# Patient Record
Sex: Male | Born: 1950
Health system: Southern US, Community
[De-identification: ages and names within clinical notes are randomized; demographics above are authoritative.]

## PROBLEM LIST (undated history)

## (undated) DIAGNOSIS — F17200 Nicotine dependence, unspecified, uncomplicated: Secondary | ICD-10-CM

## (undated) DIAGNOSIS — I639 Cerebral infarction, unspecified: Secondary | ICD-10-CM

## (undated) DIAGNOSIS — R531 Weakness: Secondary | ICD-10-CM

## (undated) DIAGNOSIS — I10 Essential (primary) hypertension: Secondary | ICD-10-CM

## (undated) DIAGNOSIS — N400 Enlarged prostate without lower urinary tract symptoms: Secondary | ICD-10-CM

## (undated) DIAGNOSIS — Z9289 Personal history of other medical treatment: Secondary | ICD-10-CM

## (undated) DIAGNOSIS — F1910 Other psychoactive substance abuse, uncomplicated: Secondary | ICD-10-CM

## (undated) HISTORY — DX: Weakness: R53.1

## (undated) HISTORY — PX: ABDOMINAL SURGERY: SHX537

## (undated) HISTORY — DX: Essential (primary) hypertension: I10

## (undated) HISTORY — PX: MANDIBLE SURGERY: SHX707

## (undated) HISTORY — PX: FRACTURE SURGERY: SHX138

## (undated) HISTORY — DX: Nicotine dependence, unspecified, uncomplicated: F17.200

---

## 1999-02-21 ENCOUNTER — Encounter: Payer: Self-pay | Admitting: *Deleted

## 1999-02-21 ENCOUNTER — Emergency Department (HOSPITAL_COMMUNITY): Admission: EM | Admit: 1999-02-21 | Discharge: 1999-02-21 | Payer: Self-pay | Admitting: *Deleted

## 2002-09-26 ENCOUNTER — Encounter: Payer: Self-pay | Admitting: *Deleted

## 2002-09-26 ENCOUNTER — Emergency Department (HOSPITAL_COMMUNITY): Admission: EM | Admit: 2002-09-26 | Discharge: 2002-09-26 | Payer: Self-pay | Admitting: Emergency Medicine

## 2002-12-28 ENCOUNTER — Emergency Department (HOSPITAL_COMMUNITY): Admission: EM | Admit: 2002-12-28 | Discharge: 2002-12-28 | Payer: Self-pay | Admitting: Emergency Medicine

## 2003-01-04 ENCOUNTER — Ambulatory Visit (HOSPITAL_BASED_OUTPATIENT_CLINIC_OR_DEPARTMENT_OTHER): Admission: RE | Admit: 2003-01-04 | Discharge: 2003-01-04 | Payer: Self-pay | Admitting: General Surgery

## 2005-06-24 ENCOUNTER — Emergency Department (HOSPITAL_COMMUNITY): Admission: EM | Admit: 2005-06-24 | Discharge: 2005-06-24 | Payer: Self-pay | Admitting: Family Medicine

## 2005-06-25 ENCOUNTER — Ambulatory Visit: Payer: Self-pay | Admitting: Family Medicine

## 2005-06-25 ENCOUNTER — Ambulatory Visit: Payer: Self-pay | Admitting: *Deleted

## 2007-04-15 ENCOUNTER — Emergency Department (HOSPITAL_COMMUNITY): Admission: EM | Admit: 2007-04-15 | Discharge: 2007-04-15 | Payer: Self-pay | Admitting: Emergency Medicine

## 2008-06-27 ENCOUNTER — Inpatient Hospital Stay (HOSPITAL_COMMUNITY): Admission: EM | Admit: 2008-06-27 | Discharge: 2008-07-02 | Payer: Self-pay | Admitting: Emergency Medicine

## 2008-06-27 ENCOUNTER — Ambulatory Visit: Payer: Self-pay | Admitting: Cardiology

## 2008-06-28 ENCOUNTER — Ambulatory Visit: Payer: Self-pay | Admitting: *Deleted

## 2008-06-28 ENCOUNTER — Encounter (INDEPENDENT_AMBULATORY_CARE_PROVIDER_SITE_OTHER): Payer: Self-pay | Admitting: *Deleted

## 2008-06-28 ENCOUNTER — Ambulatory Visit: Payer: Self-pay | Admitting: Physical Medicine & Rehabilitation

## 2008-08-09 ENCOUNTER — Ambulatory Visit: Payer: Self-pay | Admitting: Internal Medicine

## 2008-08-09 LAB — CONVERTED CEMR LAB
ALT: 15 units/L (ref 0–53)
AST: 17 units/L (ref 0–37)
Albumin: 4.4 g/dL (ref 3.5–5.2)
CO2: 23 meq/L (ref 19–32)
Calcium: 9.7 mg/dL (ref 8.4–10.5)
Chloride: 107 meq/L (ref 96–112)
Cholesterol: 218 mg/dL — ABNORMAL HIGH (ref 0–200)
Creatinine, Ser: 1.13 mg/dL (ref 0.40–1.50)
Microalb, Ur: 0.75 mg/dL (ref 0.00–1.89)
Potassium: 4.5 meq/L (ref 3.5–5.3)
Total Protein: 7.4 g/dL (ref 6.0–8.3)

## 2010-03-04 ENCOUNTER — Emergency Department (HOSPITAL_COMMUNITY): Admission: EM | Admit: 2010-03-04 | Discharge: 2010-03-04 | Payer: Self-pay | Admitting: Emergency Medicine

## 2010-04-11 ENCOUNTER — Ambulatory Visit (HOSPITAL_COMMUNITY)
Admission: RE | Admit: 2010-04-11 | Discharge: 2010-04-11 | Payer: Self-pay | Source: Home / Self Care | Attending: Family Medicine | Admitting: Family Medicine

## 2010-07-16 LAB — COMPREHENSIVE METABOLIC PANEL
ALT: 20 U/L (ref 0–53)
AST: 20 U/L (ref 0–37)
Albumin: 3.9 g/dL (ref 3.5–5.2)
Alkaline Phosphatase: 41 U/L (ref 39–117)
BUN: 14 mg/dL (ref 6–23)
CO2: 25 mEq/L (ref 19–32)
Calcium: 9.4 mg/dL (ref 8.4–10.5)
Chloride: 107 mEq/L (ref 96–112)
Creatinine, Ser: 1.22 mg/dL (ref 0.4–1.5)
GFR calc Af Amer: >60 mL/min (ref >60)
GFR calc non Af Amer: >60 mL/min (ref >60)
Glucose, Bld: 98 mg/dL (ref 70–99)
Potassium: 3.9 mEq/L (ref 3.5–5.1)
Sodium: 138 mEq/L (ref 135–145)
Total Bilirubin: 0.9 mg/dL (ref 0.3–1.2)
Total Protein: 7.3 g/dL (ref 6.0–8.3)

## 2010-07-16 LAB — CBC
Hemoglobin: 14.7 g/dL (ref 13.0–17.0)
MCH: 29.9 pg (ref 26.0–34.0)
Platelets: 172 10*3/uL (ref 150–400)
RBC: 4.91 MIL/uL (ref 4.22–5.81)
WBC: 3.9 10*3/uL — ABNORMAL LOW (ref 4.0–10.5)

## 2010-07-16 LAB — PSA: PSA: 1.09 ng/mL (ref <=4.00)

## 2010-07-16 LAB — LIPID PANEL
Triglycerides: 82 mg/dL (ref <150)
VLDL: 16 mg/dL (ref 0–40)

## 2010-07-16 LAB — TSH: TSH: 0.988 u[IU]/mL (ref 0.350–4.500)

## 2010-07-16 LAB — MICROALBUMIN, URINE: Microalb, Ur: 0.77 mg/dL (ref 0.00–1.89)

## 2010-07-17 LAB — POCT I-STAT, CHEM 8
BUN: 19 mg/dL (ref 6–23)
Calcium, Ion: 1.21 mmol/L (ref 1.12–1.32)
Creatinine, Ser: 1.3 mg/dL (ref 0.4–1.5)
Hemoglobin: 16.7 g/dL (ref 13.0–17.0)
TCO2: 22 mmol/L (ref 0–100)

## 2010-07-17 LAB — URINALYSIS, ROUTINE W REFLEX MICROSCOPIC
Glucose, UA: NEGATIVE mg/dL
Hgb urine dipstick: NEGATIVE
Ketones, ur: NEGATIVE mg/dL
Protein, ur: NEGATIVE mg/dL
Urobilinogen, UA: 0.2 mg/dL (ref 0.0–1.0)

## 2010-08-20 LAB — CK TOTAL AND CKMB (NOT AT ARMC)
CK, MB: 0.8 ng/mL (ref 0.3–4.0)
Relative Index: 0.6 (ref 0.0–2.5)
Relative Index: 0.7 (ref 0.0–2.5)

## 2010-08-20 LAB — COMPREHENSIVE METABOLIC PANEL
AST: 19 U/L (ref 0–37)
Albumin: 3.6 g/dL (ref 3.5–5.2)
Calcium: 9.8 mg/dL (ref 8.4–10.5)
Chloride: 103 mEq/L (ref 96–112)
Creatinine, Ser: 1.07 mg/dL (ref 0.4–1.5)
GFR calc Af Amer: >60 mL/min (ref >60)
Total Bilirubin: 1.1 mg/dL (ref 0.3–1.2)

## 2010-08-20 LAB — CBC
HCT: 40.8 % (ref 39.0–52.0)
Hemoglobin: 13.5 g/dL (ref 13.0–17.0)
Hemoglobin: 13.6 g/dL (ref 13.0–17.0)
MCHC: 33.8 g/dL (ref 30.0–36.0)
MCV: 92.2 fL (ref 78.0–100.0)
Platelets: 184 10*3/uL (ref 150–400)
RBC: 4.37 MIL/uL (ref 4.22–5.81)
RDW: 12.6 % (ref 11.5–15.5)
RDW: 12.9 % (ref 11.5–15.5)
WBC: 5.8 10*3/uL (ref 4.0–10.5)

## 2010-08-20 LAB — BASIC METABOLIC PANEL
BUN: 12 mg/dL (ref 6–23)
Calcium: 8.8 mg/dL (ref 8.4–10.5)
Creatinine, Ser: 0.98 mg/dL (ref 0.4–1.5)
GFR calc Af Amer: >60 mL/min (ref >60)
GFR calc non Af Amer: >60 mL/min (ref >60)
GFR calc non Af Amer: >60 mL/min (ref >60)
Sodium: 136 mEq/L (ref 135–145)

## 2010-08-20 LAB — TROPONIN I
Troponin I: <0.01 ng/mL (ref 0.00–0.06)
Troponin I: <0.01 ng/mL (ref 0.00–0.06)

## 2010-08-20 LAB — TSH: TSH: 1.081 u[IU]/mL (ref 0.350–4.500)

## 2010-08-20 LAB — URINALYSIS, ROUTINE W REFLEX MICROSCOPIC
Bilirubin Urine: NEGATIVE
Nitrite: NEGATIVE
Specific Gravity, Urine: 1.023 (ref 1.005–1.030)
Urobilinogen, UA: 1 mg/dL (ref 0.0–1.0)

## 2010-08-20 LAB — POCT I-STAT, CHEM 8
Glucose, Bld: 86 mg/dL (ref 70–99)
HCT: 47 % (ref 39.0–52.0)
Hemoglobin: 16 g/dL (ref 13.0–17.0)
Potassium: 4.6 mEq/L (ref 3.5–5.1)
Sodium: 140 mEq/L (ref 135–145)

## 2010-08-20 LAB — PROTIME-INR: INR: 1 (ref 0.00–1.49)

## 2010-08-20 LAB — DIFFERENTIAL
Basophils Absolute: 0 10*3/uL (ref 0.0–0.1)
Basophils Relative: 1 % (ref 0–1)
Eosinophils Absolute: 0.1 10*3/uL (ref 0.0–0.7)
Neutrophils Relative %: 56 % (ref 43–77)

## 2010-08-20 LAB — LIPID PANEL
Cholesterol: 172 mg/dL (ref 0–200)
HDL: 65 mg/dL (ref >39)
LDL Cholesterol: 86 mg/dL (ref 0–99)
Total CHOL/HDL Ratio: 2.6 RATIO

## 2010-08-20 LAB — RAPID URINE DRUG SCREEN, HOSP PERFORMED
Opiates: NOT DETECTED
Tetrahydrocannabinol: NOT DETECTED

## 2010-08-20 LAB — VITAMIN B12: Vitamin B-12: 413 pg/mL (ref 211–911)

## 2010-08-20 LAB — APTT: aPTT: 31 seconds (ref 24–37)

## 2010-09-17 NOTE — Discharge Summary (Signed)
Andre [name], Andre [name]            ACCOUNT NO.:  [id number]   MEDICAL RECORD NO.:  [id number]          PATIENT TYPE:  INP   LOCATION:  3037                         FACILITY:  [facility]   PHYSICIAN:  Theodosia Paling, MD    DATE OF BIRTH:  31-Mar-1951   DATE OF ADMISSION:  06/27/2008  DATE OF DISCHARGE:                               DISCHARGE SUMMARY   ADDENDUM:   DISCHARGE MEDICATIONS:  1. Aspirin enteric-coated 325 mg p.o. daily.  2. Thiamine 100 p.o. daily.  3. Folic acid 1 mg p.o. daily.  4. Senokot 1 tab p.o. nightly.      Theodosia Paling, MD  Electronically Signed     NP/MEDQ  D:  06/29/2008  T:  06/29/2008  Job:  [id number]

## 2010-09-17 NOTE — H&P (Signed)
Andre [name], Andre [name]            ACCOUNT NO.:  [id number]   MEDICAL RECORD NO.:  [id number]          PATIENT TYPE:  INP   LOCATION:  1828                         FACILITY:  [facility]   PHYSICIAN:  Theodosia Paling, MD    DATE OF BIRTH:  12-14-1950   DATE OF ADMISSION:  06/27/2008  DATE OF DISCHARGE:                              HISTORY & PHYSICAL   This is an admission for [facility] team [facility].   REASON FOR ADMISSION:  Left lower extremity weakness.   PRIMARY CARE Andre [name]:  None.   HISTORY OF PRESENT ILLNESS:  [name] is a 60 year old man with a  history of untreated hypertension who presented with left foot weakness  and numbness, as well as decreased sensation in the left foot that he  noticed upon awakening on June 25, 2008 a.m.  This was the first  time that he ever had an episode of this sort, and the weakness and the  numbness have neither worsened or improved since they first started.  The patient has had difficulty walking since he has to slide his left  foot on the floor.  He denies any history of headache, visual  disturbance, dizziness, chest pain, heart palpitations, dyspnea, and  other focal weakness or numbness.   PAST MEDICAL HISTORY:  1. Untreated hypertension.  2. Polysubstance abuse notable for alcohol and cocaine.   HOME MEDICATIONS:  None.   ALLERGIES:  No known drug allergies.   SOCIAL HISTORY:  The patient has smoked one pack per day for 40 years.  He binges on alcohol on weekends, and mainly drinks beer.  He last drank  on June 24, 2008.  He does use cocaine occasionally.   FAMILY HISTORY:  The patient is uncertain of his family history since he  is not close to his family.  He is not sure whether anyone had coronary  artery disease or history of stroke.   PHYSICAL EXAMINATION:  VITAL SIGNS:  Temperature 98.0, heart rate 73,  blood pressure 174/101, SpO2 99% on room air, and respiratory rate 12.  GENERAL:  Middle-aged man in no acute  distress.  EYES:  PERRLA.  EOMI.  Anicteric sclerae.  NECK:  Supple.  No lymphadenopathy, thyromegaly, or carotid bruits.  HEENT:  Atraumatic.  Oropharynx is clear.  Moist mucous membranes.  CARDIOVASCULAR:  Regular rate and rhythm.  No murmurs, rubs, or gallops  appreciated.  LUNGS:  Clear to auscultation bilaterally with good air movement  throughout.  ABDOMEN:  Bowel sounds positive.  Soft, nontender, and nondistended.  No  palpable masses.  No organomegaly.  EXTREMITIES:  No edema, cyanosis, or clubbing.  NEUROLOGIC:  The patient is awake, alert, and oriented x3.  Cranial  nerves II through XII are intact.  Sensitivity to light touch is  decreased in the left upper extremity as well as the left lower  extremity (especially in the left foot).  His deep tendon reflexes are  1+ in both upper extremities.  The right patellar reflex is 2+, but it  is 1- on the left.  He has no Achilles reflex on the left, but a 1+ on  the right.  There is minimal reaction to plantar stimulation on the  left, but brisk reaction on the right (toes downgoing).  Strength is 5/5  in both upper extremities.  Foot dorsiflexion is 5/5 on the right, 1/5  on the left, and foot extension is 4/5 on the right and 2/5 on the left.  Hip flexion is 3/5 on the left and 5/5 on the right.  Hip extension is  3/5 on the left and 5/5 on the right.  I did not assess gait since the  patient was in an emergency room hallway, and it was felt unsafe at the  time.   LABORATORY DATA:  White blood count 4.9, ANC 2.8, hemoglobin 14.1, MCV  92, RDW 12.9, and platelets 184.  Sodium 140, potassium 4.6, chloride  107, bicarb 28, BUN 12, creatinine 1.2, and glucose 86.  UDS positive  for cocaine.   IMAGING:  Head CT scan without contrast medium was negative for bleed or  acute intracranial process.  However, subacute or chronic lacunar  infarct was noted in the anterior limb of the left internal capsule.   ASSESSMENT AND PLAN:  1.  Left foot weakness/numbness.  The patient's neurological symptoms      do not correlate with the lacunar infarct documented on the head CT      scan.  He probably did have a subacute cerebrovascular vascular      accident, although we would have expected it to show what is on the      head CT scan by now, since he has had the symptoms for about 48      hours.  The patient has risk factors for cerebrovascular disease,      notably his cocaine use and untreated hypertension.  The patient      will be admitted to stepdown bed to control his blood pressure.  We      will obtain an MRI and MRA of the brain and neck with and without      contrast medium.  Check 2D echo to assess for thrombus.  Carotid      Dopplers for carotid stenosis.  We will rule out acute coronary      syndrome with serial cardiac enzymes and EKGs, and start him on      daily aspirin.  We will check a homocysteine level as well as coags      and fasting lipid profile.  We will also check an RPR, B12, and      folate level.  A PT/OT consult will be obtained.  A bedside swallow      evaluation will be made before the patient is fed.  2. Stage II hypertension in the setting of probable subacute      cerebrovascular vascular accident.  We need to decrease the      patient's blood pressure by no more than 25%.  We will avoid using      beta-blockers because his UDS is positive for cocaine.  The patient      will be admitted to the [facility], and started on a nicardipine      drip at 5 mg/hr to be titrated for a goal systolic of 140-155.  We      will also start him on hydrochlorothiazide and lisinopril that will      be ordered with hold parameters.  3. Polysubstance abuse.  The patient does relay a history of becoming      nervous  and jittery when he has not had a drink in a few days.      However, he does not have a frank history of DTs.  We will start      him on a CIWA protocol with orders for the RN to contact MD if  any      drugs are needed.  We will start thiamine and folate      supplementation.  A tobacco cessation consult will be obtained as      well as substance abuse counseling.  4. Prophylaxis.  SCDs will be used for deep venous thrombosis      prophylaxis.  At this point, the patient does not need any      gastrointestinal prophylaxis.      Olene Craven, M.D.  Electronically Signed      Theodosia Paling, MD  Electronically Signed    MC/MEDQ  D:  06/27/2008  T:  06/28/2008  Job:  [phone]

## 2010-09-17 NOTE — Discharge Summary (Signed)
NAMECHANE, Andre [name]            ACCOUNT NO.:  [id number]   MEDICAL RECORD NO.:  [id number]          PATIENT TYPE:  INP   LOCATION:  3037                         FACILITY:  [facility]   PHYSICIAN:  Theodosia Paling, MD    DATE OF BIRTH:  [birth year]   DATE OF ADMISSION:  06/27/2008  DATE OF DISCHARGE:  06/29/2008                               DISCHARGE SUMMARY   ADMITTING HISTORY:  Please refer to the excellent admission note  dictated by Dr. Kathe Becton under history of present illness.   DISCHARGE DIAGNOSES:  1. Subacute right internal capsule stroke.  2. Polysubstance abuse for alcohol and cocaine.   DISCHARGE MEDICATION:  1. Aspirin enteric-coated 325 mg p.o. daily.  2. Thiamine 100 mg p.o. daily.  3. Folic acid 1 mg p.o. daily.  4. Senokot 1 tablet p.o. q.h.s.   HOSPITAL COURSE:  The following issues were addressed during the  hospitalization.  1. Left foot weakness and numbness.  The patient had 3/5 strength in      his left lower leg associated with decreased sensation.  The      patient underwent an MRI evaluation which showed an acute to      subacute ischemic stroke in the posterior limb of the right      internal capsule.  The patient underwent physical therapy      evaluation, who recommended CIR placement.  The patient is going to      CIR for further evaluation and management.  The patient underwent a      carotid duplex which showed no significant internal carotid artery      stenosis.  An echocardiogram of the heart was performed, which      showed overall left ventricular systolic function to be mildly      decreased with a left ventricular ejection fraction ranging from 40-      45% with mild-to-moderate dilation of the left atrium.  The patient      did not undergo transesophageal echo this hospitalization.      Telemetry showed a normal sinus rhythm.  RPR was negative.  Vitamin      B12 was normal.  2. Polysubstance abuse.  The patient did not have any  evidence of      alcohol withdrawal.  P.r.n. Ativan was requested, which he never      needed.  3. Reactive hypertension.  The patient had reactive hypertension in      the setting of acute stroke.  The patient did not need any      antihypertensives during hospitalization.  The  was also almost      normal.   DISPOSITION:  The patient is getting transferred to CIR for further  evaluation and management.   Total time spent in discharge of this patient:  45 minutes.      Theodosia Paling, MD  Electronically Signed    NP/MEDQ  D:  06/29/2008  T:  06/29/2008  Job:  [id number]

## 2010-10-28 ENCOUNTER — Emergency Department (HOSPITAL_COMMUNITY)
Admission: EM | Admit: 2010-10-28 | Discharge: 2010-10-28 | Disposition: A | Discharge status: Home / Self Care | Payer: Medicaid Other | Attending: Emergency Medicine | Admitting: Emergency Medicine

## 2010-10-28 ENCOUNTER — Emergency Department (HOSPITAL_COMMUNITY): Payer: Medicaid Other

## 2010-10-28 DIAGNOSIS — R51 Headache: Secondary | ICD-10-CM | POA: Insufficient documentation

## 2010-10-28 DIAGNOSIS — Z8673 Personal history of transient ischemic attack (TIA), and cerebral infarction without residual deficits: Secondary | ICD-10-CM | POA: Insufficient documentation

## 2010-10-28 DIAGNOSIS — I10 Essential (primary) hypertension: Secondary | ICD-10-CM | POA: Insufficient documentation

## 2010-10-28 DIAGNOSIS — M79609 Pain in unspecified limb: Secondary | ICD-10-CM | POA: Insufficient documentation

## 2010-10-28 DIAGNOSIS — Z79899 Other long term (current) drug therapy: Secondary | ICD-10-CM | POA: Insufficient documentation

## 2010-10-28 DIAGNOSIS — R110 Nausea: Secondary | ICD-10-CM | POA: Insufficient documentation

## 2010-10-28 DIAGNOSIS — R29898 Other symptoms and signs involving the musculoskeletal system: Secondary | ICD-10-CM | POA: Insufficient documentation

## 2010-10-28 DIAGNOSIS — R209 Unspecified disturbances of skin sensation: Secondary | ICD-10-CM | POA: Insufficient documentation

## 2010-11-08 ENCOUNTER — Emergency Department (HOSPITAL_COMMUNITY)
Admission: EM | Admit: 2010-11-08 | Discharge: 2010-11-09 | Disposition: A | Discharge status: Home / Self Care | Payer: Medicaid Other | Attending: Emergency Medicine | Admitting: Emergency Medicine

## 2010-11-08 DIAGNOSIS — S2249XA Multiple fractures of ribs, unspecified side, initial encounter for closed fracture: Secondary | ICD-10-CM | POA: Insufficient documentation

## 2010-11-08 DIAGNOSIS — W108XXA Fall (on) (from) other stairs and steps, initial encounter: Secondary | ICD-10-CM | POA: Insufficient documentation

## 2010-11-09 ENCOUNTER — Emergency Department (HOSPITAL_COMMUNITY): Payer: Medicaid Other

## 2010-11-15 ENCOUNTER — Emergency Department (HOSPITAL_COMMUNITY): Payer: Medicaid Other

## 2010-11-15 ENCOUNTER — Emergency Department (HOSPITAL_COMMUNITY)
Admission: EM | Admit: 2010-11-15 | Discharge: 2010-11-15 | Disposition: A | Discharge status: Home / Self Care | Payer: Medicaid Other | Attending: Emergency Medicine | Admitting: Emergency Medicine

## 2010-11-15 DIAGNOSIS — K5900 Constipation, unspecified: Secondary | ICD-10-CM | POA: Insufficient documentation

## 2010-11-15 DIAGNOSIS — S2249XA Multiple fractures of ribs, unspecified side, initial encounter for closed fracture: Secondary | ICD-10-CM | POA: Insufficient documentation

## 2010-11-15 DIAGNOSIS — I10 Essential (primary) hypertension: Secondary | ICD-10-CM | POA: Insufficient documentation

## 2010-11-15 DIAGNOSIS — R079 Chest pain, unspecified: Secondary | ICD-10-CM | POA: Insufficient documentation

## 2010-11-15 DIAGNOSIS — X58XXXA Exposure to other specified factors, initial encounter: Secondary | ICD-10-CM | POA: Insufficient documentation

## 2010-11-15 DIAGNOSIS — Z79899 Other long term (current) drug therapy: Secondary | ICD-10-CM | POA: Insufficient documentation

## 2010-11-15 DIAGNOSIS — Z09 Encounter for follow-up examination after completed treatment for conditions other than malignant neoplasm: Secondary | ICD-10-CM | POA: Insufficient documentation

## 2011-03-27 ENCOUNTER — Emergency Department (HOSPITAL_COMMUNITY): Payer: Medicaid Other

## 2011-03-27 ENCOUNTER — Encounter: Payer: Self-pay | Admitting: *Deleted

## 2011-03-27 ENCOUNTER — Inpatient Hospital Stay (HOSPITAL_COMMUNITY)
Admission: EM | Admit: 2011-03-27 | Discharge: 2011-04-03 | DRG: 064 | Disposition: A | Discharge status: Home / Self Care | Payer: Medicaid Other | Source: Ambulatory Visit | Attending: Internal Medicine | Admitting: Internal Medicine

## 2011-03-27 DIAGNOSIS — J690 Pneumonitis due to inhalation of food and vomit: Secondary | ICD-10-CM | POA: Diagnosis present

## 2011-03-27 DIAGNOSIS — Z23 Encounter for immunization: Secondary | ICD-10-CM

## 2011-03-27 DIAGNOSIS — N179 Acute kidney failure, unspecified: Secondary | ICD-10-CM | POA: Diagnosis present

## 2011-03-27 DIAGNOSIS — I10 Essential (primary) hypertension: Secondary | ICD-10-CM

## 2011-03-27 DIAGNOSIS — F1210 Cannabis abuse, uncomplicated: Secondary | ICD-10-CM | POA: Diagnosis present

## 2011-03-27 DIAGNOSIS — F1020 Alcohol dependence, uncomplicated: Secondary | ICD-10-CM | POA: Diagnosis present

## 2011-03-27 DIAGNOSIS — F17200 Nicotine dependence, unspecified, uncomplicated: Secondary | ICD-10-CM | POA: Diagnosis present

## 2011-03-27 DIAGNOSIS — F10239 Alcohol dependence with withdrawal, unspecified: Secondary | ICD-10-CM | POA: Diagnosis present

## 2011-03-27 DIAGNOSIS — J9600 Acute respiratory failure, unspecified whether with hypoxia or hypercapnia: Secondary | ICD-10-CM | POA: Diagnosis present

## 2011-03-27 DIAGNOSIS — R4182 Altered mental status, unspecified: Secondary | ICD-10-CM

## 2011-03-27 DIAGNOSIS — I6350 Cerebral infarction due to unspecified occlusion or stenosis of unspecified cerebral artery: Principal | ICD-10-CM | POA: Diagnosis present

## 2011-03-27 DIAGNOSIS — I63232 Cerebral infarction due to unspecified occlusion or stenosis of left carotid arteries: Secondary | ICD-10-CM

## 2011-03-27 DIAGNOSIS — I6340 Cerebral infarction due to embolism of unspecified cerebral artery: Secondary | ICD-10-CM

## 2011-03-27 DIAGNOSIS — F1910 Other psychoactive substance abuse, uncomplicated: Secondary | ICD-10-CM | POA: Diagnosis present

## 2011-03-27 DIAGNOSIS — F10939 Alcohol use, unspecified with withdrawal, unspecified: Secondary | ICD-10-CM | POA: Diagnosis present

## 2011-03-27 DIAGNOSIS — K5900 Constipation, unspecified: Secondary | ICD-10-CM | POA: Diagnosis present

## 2011-03-27 DIAGNOSIS — R0902 Hypoxemia: Secondary | ICD-10-CM | POA: Diagnosis present

## 2011-03-27 HISTORY — DX: Cerebral infarction, unspecified: I63.9

## 2011-03-27 HISTORY — DX: Essential (primary) hypertension: I10

## 2011-03-27 LAB — RAPID URINE DRUG SCREEN, HOSP PERFORMED
Barbiturates: NOT DETECTED
Benzodiazepines: NOT DETECTED

## 2011-03-27 LAB — POCT I-STAT 3, VENOUS BLOOD GAS (G3P V)
Acid-base deficit: 4 mmol/L — ABNORMAL HIGH (ref 0.0–2.0)
pCO2, Ven: 47.6 mmHg (ref 45.0–50.0)
pO2, Ven: 33 mmHg (ref 30.0–45.0)

## 2011-03-27 LAB — CBC
MCHC: 32.8 g/dL (ref 30.0–36.0)
MCV: 90.7 fL (ref 78.0–100.0)
Platelets: 174 10*3/uL (ref 150–400)
RDW: 13.8 % (ref 11.5–15.5)
WBC: 12.4 10*3/uL — ABNORMAL HIGH (ref 4.0–10.5)

## 2011-03-27 LAB — COMPREHENSIVE METABOLIC PANEL
ALT: 43 U/L (ref 0–53)
Albumin: 3.9 g/dL (ref 3.5–5.2)
Alkaline Phosphatase: 56 U/L (ref 39–117)
BUN: 31 mg/dL — ABNORMAL HIGH (ref 6–23)
Chloride: 98 mEq/L (ref 96–112)
GFR calc Af Amer: 25 mL/min — ABNORMAL LOW (ref >90)
Glucose, Bld: 125 mg/dL — ABNORMAL HIGH (ref 70–99)
Potassium: 4.3 mEq/L (ref 3.5–5.1)
Total Bilirubin: 0.3 mg/dL (ref 0.3–1.2)

## 2011-03-27 LAB — URINE MICROSCOPIC-ADD ON

## 2011-03-27 LAB — ETHANOL: Alcohol, Ethyl (B): 149 mg/dL — ABNORMAL HIGH (ref 0–11)

## 2011-03-27 LAB — URINALYSIS, ROUTINE W REFLEX MICROSCOPIC
Ketones, ur: 15 mg/dL — AB
Leukocytes, UA: NEGATIVE
Nitrite: NEGATIVE
Specific Gravity, Urine: 1.017 (ref 1.005–1.030)
pH: 5 (ref 5.0–8.0)

## 2011-03-27 LAB — DIFFERENTIAL
Basophils Absolute: 0 10*3/uL (ref 0.0–0.1)
Basophils Relative: 0 % (ref 0–1)
Eosinophils Absolute: 0 10*3/uL (ref 0.0–0.7)
Eosinophils Relative: 0 % (ref 0–5)
Lymphocytes Relative: 6 % — ABNORMAL LOW (ref 12–46)

## 2011-03-27 LAB — MRSA PCR SCREENING: MRSA by PCR: NEGATIVE

## 2011-03-27 MED ORDER — THIAMINE HCL 100 MG/ML IJ SOLN
100.0000 mg | Freq: Every day | INTRAMUSCULAR | Status: DC
  Administered 2011-03-27 – 2011-03-28 (×2): 200 mg via INTRAVENOUS
  Administered 2011-03-29: 100 mg via INTRAVENOUS
  Filled 2011-03-27 (×5): qty 1

## 2011-03-27 MED ORDER — PANTOPRAZOLE SODIUM 40 MG IV SOLR
40.0000 mg | INTRAVENOUS | Status: DC
  Administered 2011-03-27: 40 mg via INTRAVENOUS

## 2011-03-27 MED ORDER — FOLIC ACID 5 MG/ML IJ SOLN
1.0000 mg | Freq: Every day | INTRAMUSCULAR | Status: DC
  Administered 2011-03-27 – 2011-03-30 (×4): 1 mg via INTRAVENOUS
  Filled 2011-03-27 (×6): qty 0.2

## 2011-03-27 MED ORDER — SODIUM CHLORIDE 0.9 % IV SOLN
3.0000 g | INTRAVENOUS | Status: AC
  Administered 2011-03-27: 3 g via INTRAVENOUS
  Filled 2011-03-27: qty 3

## 2011-03-27 MED ORDER — VITAMIN B-1 100 MG PO TABS
100.0000 mg | ORAL_TABLET | Freq: Every day | ORAL | Status: DC
  Administered 2011-03-30 – 2011-04-03 (×5): 100 mg via ORAL
  Filled 2011-03-27 (×8): qty 1

## 2011-03-27 MED ORDER — HEPARIN SODIUM (PORCINE) 5000 UNIT/ML IJ SOLN
5000.0000 [IU] | Freq: Three times a day (TID) | INTRAMUSCULAR | Status: DC
  Administered 2011-03-27 – 2011-04-03 (×21): 5000 [IU] via SUBCUTANEOUS
  Filled 2011-03-27 (×24): qty 1

## 2011-03-27 MED ORDER — PANTOPRAZOLE SODIUM 40 MG IV SOLR
40.0000 mg | INTRAVENOUS | Status: DC
  Administered 2011-03-28 – 2011-03-30 (×3): 40 mg via INTRAVENOUS
  Filled 2011-03-27 (×3): qty 40

## 2011-03-27 MED ORDER — LORAZEPAM 2 MG/ML IJ SOLN: 0.0000 mg | Freq: Two times a day (BID) | INTRAMUSCULAR | Status: DC

## 2011-03-27 MED ORDER — THERA M PLUS PO TABS
1.0000 | ORAL_TABLET | Freq: Every day | ORAL | Status: DC
  Administered 2011-03-30 – 2011-04-03 (×5): 1 via ORAL
  Filled 2011-03-27 (×8): qty 1

## 2011-03-27 MED ORDER — SODIUM CHLORIDE 0.9 % IV SOLN
Freq: Once | INTRAVENOUS | Status: AC
  Administered 2011-03-27: 19:00:00 via INTRAVENOUS

## 2011-03-27 MED ORDER — ONDANSETRON HCL 4 MG/2ML IJ SOLN
4.0000 mg | Freq: Four times a day (QID) | INTRAMUSCULAR | Status: DC | PRN
  Administered 2011-03-27: 4 mg via INTRAVENOUS
  Filled 2011-03-27: qty 2

## 2011-03-27 MED ORDER — SODIUM CHLORIDE 0.9 % IV SOLN
3.0000 g | Freq: Two times a day (BID) | INTRAVENOUS | Status: DC
  Administered 2011-03-28 – 2011-03-29 (×3): 3 g via INTRAVENOUS
  Filled 2011-03-27 (×4): qty 3

## 2011-03-27 MED ORDER — FOLIC ACID 1 MG PO TABS
1.0000 mg | ORAL_TABLET | Freq: Every day | ORAL | Status: DC
  Administered 2011-03-30 – 2011-04-03 (×5): 1 mg via ORAL
  Filled 2011-03-27 (×8): qty 1

## 2011-03-27 MED ORDER — LORAZEPAM 1 MG PO TABS: 1.0000 mg | ORAL_TABLET | Freq: Four times a day (QID) | ORAL | Status: DC | PRN

## 2011-03-27 MED ORDER — LORAZEPAM 2 MG/ML IJ SOLN
0.0000 mg | Freq: Four times a day (QID) | INTRAMUSCULAR | Status: DC
  Administered 2011-03-27: 2 mg via INTRAVENOUS
  Administered 2011-03-28: 1 mg via INTRAVENOUS
  Administered 2011-03-28: 2 mg via INTRAVENOUS
  Filled 2011-03-27 (×2): qty 1

## 2011-03-27 MED ORDER — LORAZEPAM 2 MG/ML IJ SOLN
1.0000 mg | Freq: Four times a day (QID) | INTRAMUSCULAR | Status: DC | PRN
  Administered 2011-03-28: 1 mg via INTRAVENOUS
  Filled 2011-03-27 (×2): qty 1

## 2011-03-27 MED ORDER — SODIUM CHLORIDE 0.9 % IV SOLN
INTRAVENOUS | Status: DC
  Administered 2011-03-27: 10:00:00 via INTRAVENOUS

## 2011-03-27 MED ORDER — ENOXAPARIN SODIUM 40 MG/0.4ML ~~LOC~~ SOLN: 40.0000 mg | SUBCUTANEOUS | Status: DC

## 2011-03-27 MED ORDER — PANTOPRAZOLE SODIUM 40 MG IV SOLR
INTRAVENOUS | Status: AC
  Filled 2011-03-27: qty 40

## 2011-03-27 NOTE — Consult Note (Signed)
Reason for Consult:"found poorly responsive in am this morning"  HPI: Andre [name] is an 60 y.o. Male. Who was found to be poorly responsive in am by a friend. It is believed that the patient had been drinking the night before. Patient himself is not awake long enough to provide detailed history. Patient has a history of cocaine use and his utox came back positive for cannabis today. EtoH level 149 in ED.  Past Medical History  Diagnosis Date  . Hypertension   . Stroke    Past Surgical History  Procedure Date  . Abdominal surgery    FH: History reviewed. No pertinent family history.  Social History:  reports that he has been smoking.  He does not have any smokeless tobacco history on file. He reports that he drinks alcohol. His drug history not on file.  Allergies: No Known Allergies  Medications: I have reviewed the patient's current medications.  ROS: as above  Blood pressure 161/90, pulse 104, temperature 99.7 F (37.6 C), temperature source Rectal, resp. rate 28, SpO2 88.00%.  Neurological exam: very lethargic, arousable only briefly to painful stimuli to tell me his name and words like "yes." Did not follow simple or complex commands. No obvious aphasia, but limited exam due to lethargy, PERRL, blink to threat positive bilaterally, no facial asymmetry, withdrew all 4 ext. To pain. Reflexes symmetric, except R BR is 1+ and L BR is 2+.  Results for orders placed during the hospital encounter of 03/27/11 (from the past 48 hour(s))  COMPREHENSIVE METABOLIC PANEL     Status: Abnormal   Collection Time   03/27/11 11:00 AM      Component Value Range Comment   Sodium 140  135 - 145 (mEq/L)    Potassium 4.3  3.5 - 5.1 (mEq/L) SLIGHT HEMOLYSIS   Chloride 98  96 - 112 (mEq/L)    CO2 18 (*) 19 - 32 (mEq/L)    Glucose, Bld 125 (*) 70 - 99 (mg/dL)    BUN 31 (*) 6 - 23 (mg/dL)    Creatinine, Ser 1.61 (*) 0.50 - 1.35 (mg/dL)    Calcium 9.3  8.4 - 10.5 (mg/dL)    Total Protein 8.3   6.0 - 8.3 (g/dL)    Albumin 3.9  3.5 - 5.2 (g/dL)    AST 68 (*) 0 - 37 (U/L) HEMOLYSIS AT THIS LEVEL MAY AFFECT RESULT   ALT 43  0 - 53 (U/L)    Alkaline Phosphatase 56  39 - 117 (U/L)    Total Bilirubin 0.3  0.3 - 1.2 (mg/dL)    GFR calc non Af Amer 22 (*) >90 (mL/min)    GFR calc Af Amer 25 (*) >90 (mL/min)   CBC     Status: Abnormal   Collection Time   03/27/11 11:00 AM      Component Value Range Comment   WBC 12.4 (*) 4.0 - 10.5 (K/uL)    RBC 5.14  4.22 - 5.81 (MIL/uL)    Hemoglobin 15.3  13.0 - 17.0 (g/dL)    HCT 09.6  04.5 - 40.9 (%)    MCV 90.7  78.0 - 100.0 (fL)    MCH 29.8  26.0 - 34.0 (pg)    MCHC 32.8  30.0 - 36.0 (g/dL)    RDW 81.1  91.4 - 78.2 (%)    Platelets 174  150 - 400 (K/uL)   DIFFERENTIAL     Status: Abnormal   Collection Time   03/27/11 11:00 AM  Component Value Range Comment   Neutrophils Relative 90 (*) 43 - 77 (%)    Neutro Abs 11.1 (*) 1.7 - 7.7 (K/uL)    Lymphocytes Relative 6 (*) 12 - 46 (%)    Lymphs Abs 0.8  0.7 - 4.0 (K/uL)    Monocytes Relative 4  3 - 12 (%)    Monocytes Absolute 0.5  0.1 - 1.0 (K/uL)    Eosinophils Relative 0  0 - 5 (%)    Eosinophils Absolute 0.0  0.0 - 0.7 (K/uL)    Basophils Relative 0  0 - 1 (%)    Basophils Absolute 0.0  0.0 - 0.1 (K/uL)   ETHANOL     Status: Abnormal   Collection Time   03/27/11 11:00 AM      Component Value Range Comment   Alcohol, Ethyl (B) 149 (*) 0 - 11 (mg/dL)   POCT I-STAT 3, BLOOD GAS (G3P V)     Status: Abnormal   Collection Time   03/27/11 11:38 AM      Component Value Range Comment   pH, Ven 7.298  7.250 - 7.300     pCO2, Ven 47.6  45.0 - 50.0 (mmHg)    pO2, Ven 33.0  30.0 - 45.0 (mmHg)    Bicarbonate 23.1  20.0 - 24.0 (mEq/L)    TCO2 25  0 - 100 (mmol/L)    O2 Saturation 54.0      Acid-base deficit 4.0 (*) 0.0 - 2.0 (mmol/L)    Patient temperature 99.7 F      Sample type VENOUS      Comment NOTIFIED PHYSICIAN     URINE RAPID DRUG SCREEN (HOSP PERFORMED)     Status: Abnormal    Collection Time   03/27/11 11:49 AM      Component Value Range Comment   Opiates NONE DETECTED  NONE DETECTED     Cocaine NONE DETECTED  NONE DETECTED     Benzodiazepines NONE DETECTED  NONE DETECTED     Amphetamines NONE DETECTED  NONE DETECTED     Tetrahydrocannabinol POSITIVE (*) NONE DETECTED     Barbiturates NONE DETECTED  NONE DETECTED    URINALYSIS, ROUTINE W REFLEX MICROSCOPIC     Status: Abnormal   Collection Time   03/27/11 11:49 AM      Component Value Range Comment   Color, Urine AMBER (*) YELLOW  BIOCHEMICALS MAY BE AFFECTED BY COLOR   Appearance CLOUDY (*) CLEAR     Specific Gravity, Urine 1.017  1.005 - 1.030     pH 5.0  5.0 - 8.0     Glucose, UA NEGATIVE  NEGATIVE (mg/dL)    Hgb urine dipstick LARGE (*) NEGATIVE     Bilirubin Urine NEGATIVE  NEGATIVE     Ketones, ur 15 (*) NEGATIVE (mg/dL)    Protein, ur 244 (*) NEGATIVE (mg/dL)    Urobilinogen, UA 0.2  0.0 - 1.0 (mg/dL)    Nitrite NEGATIVE  NEGATIVE     Leukocytes, UA NEGATIVE  NEGATIVE    URINE MICROSCOPIC-ADD ON     Status: Abnormal   Collection Time   03/27/11 11:49 AM      Component Value Range Comment   Squamous Epithelial / LPF RARE  RARE     WBC, UA 3-6  <3 (WBC/hpf)    RBC / HPF 7-10  <3 (RBC/hpf)    Bacteria, UA MANY (*) RARE     Casts HYALINE CASTS (*) NEGATIVE  GRANULAR CAST   Dg  Chest 2 View  03/27/2011  *RADIOLOGY REPORT*  Clinical Data: Altered mental status.  Chest pain and shortness of breath.  CHEST - 2 VIEW  Comparison: 11/15/2010  Findings: Low lung volumes are seen with mild bibasilar atelectasis.  No evidence of pulmonary air space disease or pleural effusion.  Heart size is prominent but likely due to patient positioning and low lung volumes.  IMPRESSION: Low lung volumes with mild bibasilar atelectasis.  Original Report Authenticated By: Danae Orleans, M.D.   Ct Head Wo Contrast  03/27/2011  *RADIOLOGY REPORT*  Clinical Data: Altered mental status.  CT HEAD WITHOUT CONTRAST  Technique:   Contiguous axial images were obtained from the base of the skull through the vertex without contrast.  Comparison: 10/28/2010  Findings: There is new low density involving the left cerebellar hemisphere suggesting edema.  No significant mass effect.  There may be a small lacune in the left basal ganglia and left thalamus. There is a subtle area of low density in the left white matter tract that may be old.  No evidence for acute hemorrhage, midline shift or hydrocephalus.  Paranasal sinuses are clear.  No acute bony abnormality.  IMPRESSION: New low density involving the left cerebellar hemisphere.  Findings are suggestive for edema and acute/subacute infarct.  Evidence for scattered lacunar injuries of unknown age.  These results were called by telephone on 03/27/2011  at  11:04 a.m. to  Dr.Jacubowitz, who verbally acknowledged these results.  Original Report Authenticated By: Richarda Overlie, M.D.   Assessment/Plan: 59 years old man with history of drug use who was drinking last night and was found minimally responsive in am. CT head shows L cerebellar CVA 1) ICU 2) Neurochecks q1h 3) Frequent vital signs 4) Mean Arterial BP between 120 and 130 5) Echo, Telemetry, CD 6) Lipid Profile, A1C, TFT 7) Bed Rest 8) Fall precautions 9) Intermittent compression stockings 10) ASA 300 mg pr 11) Head of Bed at 30 degress 12) NS at 50 cc/hr 13) NPO 14) Speech and swallow eval 15) Neurosurgery consult is appreciated 16) Will follow 17) Repeat CT head for any acute change in MS or neurological exam 18) CT head tomorrow in am if not done previously otherwise  Payton Moder 03/27/2011, 1:44 PM

## 2011-03-27 NOTE — ED Notes (Addendum)
Pt with large episode of vomiting. Nursing staff was at bedside and was able to assist pt to sit up all the way. Pt made little effort with vomiting. Pt suctioned. EDP made aware that pt may have aspirated due to clearing of throat and oxygen sats decreased to 85% on RA. Pt cleaned and is on 4L of O2 via [place]. Pt remains very groggy but will open eyes if asked to. When asked pt states he feels better. Pt remains on monitor and pulse ox.

## 2011-03-27 NOTE — H&P (Signed)
Patient name: Andre [name] Medical record number: [id number] Date of birth: 1951/02/05 Age: 60 y.o. Gender: male PCP: No primary provider on file.  Date: 03/27/2011 Reason for Consult: Cerebellar Stroke and AMS Referring Physician: Dr. Mare Loan  Brief history patient is a 60 year old man brought to the ER after was found unresponsive- found to have acute cerebellar stroke and became hypoxic while in ER.  Lines/tubes  Culture data/sepsis markers 11/22>> BCx x2>> 11/22 >> Urine culture>> Antibiotics 11/22>>Unasyn IV- ( poss Asp PNA )  Best practice GI prophylaxis : Ppi VTE prophylaxis: UFH  Protocols/consults Neuro physician Neurosurgery  Events/studies Altered mental status- in ER-CT head-11/22- acute left cerebellar stroke. Neuro physician and neurosurgery consult placed.  HPI: Patient is a 60 year old man with past with history of hypertension, polysubstance abuse and stroke was brought to the ER after was found unresponsive sitting in his chair- by his brother-in-law who also states that patient was drinking last night. CT head showed acute cerebellar stroke and patient was hypoxic and unresponsive and so CCM was consulted for admitting patient for further management. Patient did vomit once while he was in ER.    Past Medical History  Diagnosis Date  . Hypertension   . Stroke     Past Surgical History  Procedure Date  . Abdominal surgery     History reviewed. No pertinent family history.  Social History:  reports that he has been smoking.  He does not have any smokeless tobacco history on file. He reports that he drinks alcohol. His drug history not on file.  Allergies: No Known Allergies  Medications:  Prior to Admission medications   Not on File    Review of systems not obtained due to altered mental status.  Temp:  [99.7 F (37.6 C)] 99.7 F (37.6 C) (11/22 0950) Pulse Rate:  [91-96] 91  (11/22 1100) Resp:  [18-33] 33  (11/22 1100) BP:  (125-154)/(84-98) 140/84 mmHg (11/22 1100) SpO2:  [90 %-94 %] 93 % (11/22 1100)   No intake or output data in the 24 hours ending 03/27/11 1155 Physical exam Physical Exam  General: somnolent with intermittent eye-opening with commands. HENT:  Eyes: PERRLA - pupils 2mm bilaterally Neck: Neck supple. No tracheal deviation present. No thyromegaly present.  Cardiovascular: S1, S2 , RRR.  No murmur heard.  Pulmonary/Chest: Effort normal and breath sounds normal.  Abdominal: Soft. Bowel sounds are normal. no distension or no tenderness.  Musculoskeletal:  no edema and no tenderness.  Neuro: Spontaneous movement of all extremities. Speech slurred incomprehensible. Does not follow simple commands. Opens eyes intermittently to verbal stimulus. GSC score- 11, normal reflexes. Gag reflex intact  Skin: Skin is warm and dry. No rash noted.    Radiology Dg Chest 2 View  03/27/2011 *RADIOLOGY REPORT* Clinical Data: Altered mental status. Chest pain and shortness of breath. CHEST - 2 VIEW Comparison: 11/15/2010 . IMPRESSION: Low lung volumes with mild bibasilar atelectasis. Original Report Authenticated By: Danae Orleans, M.D.   Ct Head Wo Contrast  03/27/2011 *RADIOLOGY REPORT* Clinical Data: Altered mental status. CT HEAD WITHOUT CONTRAST Technique: Contiguous axial images were obtained from the base of the skull through the vertex without contrast. Comparison: 10/28/2010 Findings: There is new low density involving the left cerebellar hemisphere suggesting edema. No significant mass effect. There may be a small lacune in the left basal ganglia and left thalamus. There is a subtle area of low density in the left white matter tract that may be old. No evidence for acute hemorrhage,  midline shift or hydrocephalus. Paranasal sinuses are clear. No acute bony abnormality. IMPRESSION: New low density involving the left cerebellar hemisphere. Findings are suggestive for edema and acute/subacute infarct. Evidence  for scattered lacunar injuries of unknown age. These results were called by telephone on 03/27/2011 at 11:04 a.m. to Dr.Jacubowitz, who verbally acknowledged these results. Original Report Authenticated By: Richarda Overlie, M.D.        LAB RESULT Lab Results  Component Value Date   CREATININE 1.22 04/11/2010   BUN 14 04/11/2010   NA 138 04/11/2010   K 3.9 04/11/2010   CL 107 04/11/2010   CO2 25 04/11/2010   Lab Results  Component Value Date   WBC 12.4* 03/27/2011   HGB 15.3 03/27/2011   HCT 46.6 03/27/2011   MCV 90.7 03/27/2011   PLT 174 03/27/2011   Lab Results  Component Value Date   ALT 20 04/11/2010   AST 20 04/11/2010   ALKPHOS 41 04/11/2010   BILITOT 0.9 04/11/2010   Lab Results  Component Value Date   INR 1.0 06/27/2008     Assessment and Plan  1) Acute cerebellar nonhemorrhagic CVA: Unknown etiology. Blood pressure mildly elevated. Might be from temporary severe hypertension.  No mass effect in fourth ventricle or hydrocephalus at present.  Plan:  - Stroke protocol and stroke team followup - Neurosurgical consult done by Dr. Wynetta Emery, in ER- rules out any urgent surgical intervention needs. - Admit to neuro ICU.  2) Altered mental status: Likely secondary to accommodation of #1 and #5-alcoholism. Plan:  - As per #1 and 6  3) Hypoxic respiratory distress: Patient desaturated to 83% earlier in ER. Venous blood gas was obtained which shows PO2 of 54. Though maintaining his airways and has gag reflex but might have aspirated when he vomited in ER.  Plan:  - Continue nasal cannula and titrate as needed for sats of 88-92% - Tachypneic with RR around 30, resolved as patient became more awake. - Cover with Unasyn IV for possible aspiration pneumonia. - Blood cultures x2 before starting Unasyn. - Followup CXR in a.m. - Close monitoring of respiratory status with possible intubation in near future.  4) ID: Possible aspiration pneumonia following acute cerebellar stroke and  vomiting episode. Leukocytosis- WBC 11.4 Also UTI as #5. Plan: - 2 view chest x-ray done in ER earlier does not show any signs of aspiration at present. - Though will start coverage with Unasyn IV considering high probability of  Aspiration. - Blood cultures x2.  5)  Acute kidney injury :  Likely multifactorial etiology - dehydration, UTI, and maybe alcohol-induced. UA and microscopy shows signs of UTI. Hyaline casts suggests dehydration etiology . Low suspicion of rubber alcohol ingestion.  Plan:  - Creatinine 2.95. Monitor with BMP in a.m. - Unasyn as above would likely cover both aspiration pneumonia and UTI. - IV fluids - normal saline at 100 cc an hour.   6) Substance abuse:  Blood Alcohol level 149. UDS positive for marijuana. Plan: - Start CIWA protocol  - IV thiamine and folate until takes by mouth.   7) GI: Prophylaxis Plan: - IV Protonix. - IV Zofran for nausea/vomiting  PATEL,RAVI 03/27/2011, 11:55 AM   Admit to 3100 overnight, will transfer out and to triad in AM if symptoms become more stable.  Patient seen and examined, agree with above note.  I dictated the care and orders written for this patient under my direction.  Koren Bound, M.D.

## 2011-03-27 NOTE — ED Notes (Signed)
EDP at bedside  

## 2011-03-27 NOTE — ED Notes (Signed)
EMS-EMS called out by friend because of altered mental status. Last seen last night at 10pm after a night out drinking. Pt with hx of stroke. Pt groggy during transport but will respond to questions inappropriately. Pt smells of ETOH. 20g(L)hand. Vitals wnl. cbg wnl.

## 2011-03-27 NOTE — Progress Notes (Signed)
ANTIBIOTIC CONSULT NOTE - INITIAL  Pharmacy Consult for Unasyn Indication: r/o aspiration PNA  No Known Allergies  Patient Measurements:   Vital Signs: Temp: 99.7 F (37.6 C) (11/22 0950) Temp src: Rectal (11/22 0950) BP: 152/93 mmHg (11/22 1215) Pulse Rate: 95  (11/22 1215) Intake/Output from previous day:   Intake/Output from this shift:    Labs:  Basename 03/27/11 1100  WBC 12.4*  HGB 15.3  PLT 174  LABCREA --  CREATININE 2.95*   Estimated CrCl is ~27 ml/min without height and weight  No results found for this basename: VANCOTROUGH:2,VANCOPEAK:2,VANCORANDOM:2,GENTTROUGH:2,GENTPEAK:2,GENTRANDOM:2,TOBRATROUGH:2,TOBRAPEAK:2,TOBRARND:2,AMIKACINPEAK:2,AMIKACINTROU:2,AMIKACIN:2, in the last 72 hours   Microbiology: No results found for this or any previous visit (from the past 720 hour(s)).  Medical History: Past Medical History  Diagnosis Date  . Hypertension   . Stroke     Medications:  See PTA medication list  Assessment: 60 yo M with AMS brought to the ED and found to have a cerebellar stroke. Pharmacy consulted to begin Unasyn for r/o aspiration PNA. SCr elevated at this time.  Plan:  1. Unasyn 3 g IV q12h - 1st now. 2. F/U renal function and adjust Unasyn as appropriate. 3. Follow up culture results.  [place], Genesis Novosad Danielle 03/27/2011,12:50 PM

## 2011-03-27 NOTE — ED Notes (Signed)
Neuro at bedside.

## 2011-03-27 NOTE — ED Notes (Signed)
Attempted report x1, nurse to call back. 

## 2011-03-27 NOTE — ED Notes (Addendum)
Due to pts decreased LOC unable to do full exam. Brother in law at bedside states that pt went out drinking last night and was found sitting in a chair this am, was not very responsive. States pt with hx of possible stroke in past, pt is moving bother upper arms and right leg. Have not seen pt move left lower leg. Pt unable to follow commands enough to do full neuro exam. PERRLA. No facial droop noted but pt will not smile. Pt came with cane, sister states that pt is normally weak on the left side. Pt is able to state that his name is Andre [name] and that is he is at . Pt will nod his head but answers yes to most questions asked. EDP has evaluated pt and is placing orders. Pt is on monitor and vitals obtained.

## 2011-03-27 NOTE — ED Notes (Signed)
Pt is more alert at this time. Pt able to tell me his birthday, first and last name and where he is at. Pt with bilateral weak hand grip. Unable to lift left and right leg up more then 2 inches off of bed, both legs drop immediately. No facial droop noted. Pt updated on poc. Pt continues to clear throat.

## 2011-03-27 NOTE — ED Provider Notes (Signed)
History     CSN: [id number] Arrival date & time: 03/27/2011  9:45 AM   First MD Initiated Contact with Patient 03/27/11 [phone]      Chief Complaint  Patient presents with  . Altered Mental Status    (Consider location/radiation/quality/duration/timing/severity/associated sxs/prior treatment) HPI Level V caveat. Altered mental status. A straight obtained from patient's brother-in-law Mr Andre [name], and from paramedics. Patient found by his brother-in-law a.m. today unresponsive. Patient admits to drinking alcohol last night. Unable to establish further history from patient. EMS obtained CBG which was 130. No past medical history on file. Past medical history polysubstance abuse, stroke No past surgical history on file. Surgical history is unknown not documented on old record No family history on file.  History  Substance Use Topics  . Smoking status: Not on file  . Smokeless tobacco: Not on file  . Alcohol Use: Not on file   Social history smoker marijuana use heavy alcohol   Review of Systems  Unable to perform ROS: Mental status change    Allergies  Review of patient's allergies indicates not on file.  Home Medications  No current outpatient prescriptions on file.  BP 147/98  Pulse 96  Temp(Src) 99.7 F (37.6 C) (Rectal)  SpO2 93%  Physical Exam  Constitutional: He appears well-developed and well-nourished.  HENT:  Head: Normocephalic and atraumatic.       No facial asymmetry  Eyes: Pupils are equal, round, and reactive to light.       Pupils 2 mm reactive to light bilaterally  Neck: Neck supple. No tracheal deviation present. No thyromegaly present.  Cardiovascular: Normal rate and regular rhythm.   No murmur heard. Pulmonary/Chest: Effort normal and breath sounds normal.  Abdominal: Soft. Bowel sounds are normal. He exhibits no distension. There is no tenderness.  Musculoskeletal: He exhibits no edema and no tenderness.       Neurologic exam Spontaneous  movement of all extremities. Speech slurred incomprehensible. Does not follow simple commands. Opens eyes to verbal stimulus. Glasgow Coma Score eleven  Neurological: He displays normal reflexes. Coordination normal.       Gag reflex intact  Skin: Skin is warm and dry. No rash noted.    ED Course  Procedures (including critical care time) 9:55 AM patient vomited. May have aspirated. Pulse oximetry dropped to 83% room air immediately after vomiting.. placed on oxygen 4 L nasal cannula pulse ox symmetry on oxygen for his 90% at 10:07 AM  Labs Reviewed  COMPREHENSIVE METABOLIC PANEL  CBC  DIFFERENTIAL  ETHANOL  URINE RAPID DRUG SCREEN (HOSP PERFORMED)   No results found. 11:34 AM patient's exam essentially unchanged he opens eyes to verbal stimulus does not follow commands moves all extremities. Spoke with Dr.BEVOC neurology who will consult on the patient in the emergency department. He requested ICU admission. Pulmonary critical care consult consulted by me to evaluate patient foradmission. Spoke with Dr.Jakub from critical care service who will direct patient in the emergency   No diagnosis found.  Spoke with radiologist who advised me that CT scan shows acute cerebellar stroke left side At 11:40 AM patient's pulse oximetry is 95% on 4 L of oxygen MDM  Suspect hypoxia due to aspiration  Date: 03/27/2011  Rate: 95  Rhythm: normal sinus rhythm  QRS Axis: normal  Intervals: normal  ST/T Wave abnormalities: nonspecific ST/T changes  Conduction Disutrbances:none  Narrative Interpretation:   Old EKG Reviewed: changes noted Inverted lateral T waves new over previous tracing from 04/11/2010 Results for  orders placed during the hospital encounter of 03/27/11  CBC      Component Value Range   WBC 12.4 (*) 4.0 - 10.5 (K/uL)   RBC 5.14  4.22 - 5.81 (MIL/uL)   Hemoglobin 15.3  13.0 - 17.0 (g/dL)   HCT 45.4  09.8 - 11.9 (%)   MCV 90.7  78.0 - 100.0 (fL)   MCH 29.8  26.0 - 34.0 (pg)    MCHC 32.8  30.0 - 36.0 (g/dL)   RDW 14.7  82.9 - 56.2 (%)   Platelets 174  150 - 400 (K/uL)  DIFFERENTIAL      Component Value Range   Neutrophils Relative 90 (*) 43 - 77 (%)   Neutro Abs 11.1 (*) 1.7 - 7.7 (K/uL)   Lymphocytes Relative 6 (*) 12 - 46 (%)   Lymphs Abs 0.8  0.7 - 4.0 (K/uL)   Monocytes Relative 4  3 - 12 (%)   Monocytes Absolute 0.5  0.1 - 1.0 (K/uL)   Eosinophils Relative 0  0 - 5 (%)   Eosinophils Absolute 0.0  0.0 - 0.7 (K/uL)   Basophils Relative 0  0 - 1 (%)   Basophils Absolute 0.0  0.0 - 0.1 (K/uL)  POCT I-STAT 3, BLOOD GAS (G3P V)      Component Value Range   pH, Ven 7.298  7.250 - 7.300    pCO2, Ven 47.6  45.0 - 50.0 (mmHg)   pO2, Ven 33.0  30.0 - 45.0 (mmHg)   Bicarbonate 23.1  20.0 - 24.0 (mEq/L)   TCO2 25  0 - 100 (mmol/L)   O2 Saturation 54.0     Acid-base deficit 4.0 (*) 0.0 - 2.0 (mmol/L)   Patient temperature 99.7 F     Sample type VENOUS     Comment NOTIFIED PHYSICIAN     Dg Chest 2 View  03/27/2011  *RADIOLOGY REPORT*  Clinical Data: Altered mental status.  Chest pain and shortness of breath.  CHEST - 2 VIEW  Comparison: 11/15/2010  Findings: Low lung volumes are seen with mild bibasilar atelectasis.  No evidence of pulmonary air space disease or pleural effusion.  Heart size is prominent but likely due to patient positioning and low lung volumes.  IMPRESSION: Low lung volumes with mild bibasilar atelectasis.  Original Report Authenticated By: Danae Orleans, M.D.   Ct Head Wo Contrast  03/27/2011  *RADIOLOGY REPORT*  Clinical Data: Altered mental status.  CT HEAD WITHOUT CONTRAST  Technique:  Contiguous axial images were obtained from the base of the skull through the vertex without contrast.  Comparison: 10/28/2010  Findings: There is new low density involving the left cerebellar hemisphere suggesting edema.  No significant mass effect.  There may be a small lacune in the left basal ganglia and left thalamus. There is a subtle area of low density in  the left white matter tract that may be old.  No evidence for acute hemorrhage, midline shift or hydrocephalus.  Paranasal sinuses are clear.  No acute bony abnormality.  IMPRESSION: New low density involving the left cerebellar hemisphere.  Findings are suggestive for edema and acute/subacute infarct.  Evidence for scattered lacunar injuries of unknown age.  These results were called by telephone on 03/27/2011  at  11:04 a.m. to  Dr.Qunicy Higinbotham, who verbally acknowledged these results.  Original Report Authenticated By: Richarda Overlie, M.D.   CRITICAL CARE Performed by: Doug Sou   Total critical care time: 60 minute  Critical care time was exclusive of separately billable procedures  and treating other patients.  Critical care was necessary to treat or prevent imminent or life-threatening deterioration.  Critical care was time spent personally by me on the following activities: development of treatment plan with patient and/or surrogate as well as nursing, discussions with consultants, evaluation of patient's response to treatment, examination of patient, obtaining history from patient or surrogate, ordering and performing treatments and interventions, ordering and review of laboratory studies, ordering and review of radiographic studies, pulse oximetry and re-evaluation of patient's condition. Plan admit intensive care unit Diagnosis #1 acute cerebellar stroke #2 altered mental status #3 hypoxia       Doug Sou, MD 03/27/11 1145

## 2011-03-27 NOTE — Progress Notes (Signed)
eLink Physician-Brief Progress Note Patient Name: Andre [name] DOB: [birth year] MRN: [id number]  Date of Service  03/27/2011   HPI/Events of Note   Pt starting active withdrawal  eICU Interventions  See CIWA protocol   Intervention Category Major Interventions: Change in mental status - evaluation and management  Shan Levans 03/27/2011, 8:58 PM

## 2011-03-27 NOTE — ED Notes (Signed)
No change in status. Pt not alert enough for neuro exam. Pt with make grunting noises when you speak with him but will not follow simple commands. Remains on monitor.

## 2011-03-27 NOTE — ED Notes (Addendum)
No change in status. Remains on monitor.

## 2011-03-27 NOTE — ED Notes (Signed)
Acuity increased due to pts CT results.

## 2011-03-27 NOTE — ED Notes (Signed)
Critical care aware of blood pressure

## 2011-03-27 NOTE — ED Notes (Signed)
Admitting at bedside 

## 2011-03-27 NOTE — Consult Note (Signed)
  Referring physician: Critical-care medicine and stroke service  Admitting diagnosis: Cerebellar stroke  History of present illness: The patient is a 60 year old gentleman who is found by family this morning with altered mental status. Reportedly the patient been drinking the night before it was initially felt that he was just intoxicated. But ultimately EMS was contacted and the patient brought into the emergency department and evaluated. On initial evaluation patient was noted to have a breath alcohol level of 140 and head CT obtained revealed a cerebellar stroke. Critical-care medicine and stroke service were consulted. And they have requested a neurosurgery consult.  Patient's past medical past surgical past social history are unavailable at this time  Examination: Patient is somnolent but arousable to voice and stimulation he does localize but does not follow commands. His pupils are equal round and reactive to light, extraocular movements are intact, strength appears to be 5 of 5 in his upper and lower extremities bilaterally. However patient does not comply with exam if he will not follow commands of getting a good effort it is very difficult. He does withdraw to stimulation and localizes appropriately.  CT scan:  left cerebellar hemisphere nonhemorrhagic CVA he does not appear to be any mass effect in the fourth ventricle and there is no hydrocephalus.  Assessment plan: 60 year old [place] American gentleman who presents with altered mental status and acute left cerebellar CVA. At the time does not appear to be significant mass affect on the fourth ventricle there is no hydrocephalus there is room his basal cisterns and around his brain stem and is neurologic exam is reproducible. I do not see a surgical indication yet we will be happy to follow as consult expectantly for possible ventriculostomy placement.

## 2011-03-28 ENCOUNTER — Inpatient Hospital Stay (HOSPITAL_COMMUNITY): Payer: Medicaid Other

## 2011-03-28 DIAGNOSIS — I6789 Other cerebrovascular disease: Secondary | ICD-10-CM

## 2011-03-28 LAB — COMPREHENSIVE METABOLIC PANEL
Albumin: 3.4 g/dL — ABNORMAL LOW (ref 3.5–5.2)
BUN: 28 mg/dL — ABNORMAL HIGH (ref 6–23)
Calcium: 8.8 mg/dL (ref 8.4–10.5)
Creatinine, Ser: 1.79 mg/dL — ABNORMAL HIGH (ref 0.50–1.35)
GFR calc Af Amer: 46 mL/min — ABNORMAL LOW (ref >90)
Glucose, Bld: 105 mg/dL — ABNORMAL HIGH (ref 70–99)
Total Protein: 7 g/dL (ref 6.0–8.3)

## 2011-03-28 LAB — BLOOD GAS, ARTERIAL
Acid-Base Excess: 3.6 mmol/L — ABNORMAL HIGH (ref 0.0–2.0)
Drawn by: 249101
O2 Content: 2 L/min
O2 Saturation: 96.7 %
Patient temperature: 98.6
pO2, Arterial: 82.5 mmHg (ref 80.0–100.0)

## 2011-03-28 LAB — HEMOGLOBIN A1C: Hgb A1c MFr Bld: 5.2 % (ref <5.7)

## 2011-03-28 LAB — LIPID PANEL: LDL Cholesterol: 77 mg/dL (ref 0–99)

## 2011-03-28 MED ORDER — LORAZEPAM 2 MG/ML IJ SOLN
1.0000 mg | Freq: Four times a day (QID) | INTRAMUSCULAR | Status: DC
  Administered 2011-03-29 – 2011-03-31 (×10): 1 mg via INTRAVENOUS
  Filled 2011-03-28 (×10): qty 1

## 2011-03-28 MED ORDER — SODIUM CHLORIDE 0.9 % IV SOLN
INTRAVENOUS | Status: DC
  Administered 2011-03-29: 01:00:00 via INTRAVENOUS
  Administered 2011-03-29: 75 mL/h via INTRAVENOUS
  Administered 2011-03-31: 06:00:00 via INTRAVENOUS

## 2011-03-28 MED ORDER — ASPIRIN 300 MG RE SUPP
300.0000 mg | Freq: Every day | RECTAL | Status: DC
  Administered 2011-03-28 – 2011-03-29 (×2): 300 mg via RECTAL
  Filled 2011-03-28 (×4): qty 1

## 2011-03-28 MED ORDER — SODIUM CHLORIDE 0.9 % IV SOLN
Freq: Once | INTRAVENOUS | Status: AC
  Administered 2011-03-28: 12:00:00 via INTRAVENOUS

## 2011-03-28 MED ORDER — METOPROLOL TARTRATE 1 MG/ML IV SOLN
2.5000 mg | INTRAVENOUS | Status: DC | PRN
  Administered 2011-03-29: 2.5 mg via INTRAVENOUS
  Filled 2011-03-28 (×3): qty 5

## 2011-03-28 MED ORDER — ASPIRIN EC 325 MG PO TBEC
325.0000 mg | DELAYED_RELEASE_TABLET | Freq: Every day | ORAL | Status: DC
  Administered 2011-03-30 – 2011-04-03 (×5): 325 mg via ORAL
  Filled 2011-03-28 (×7): qty 1

## 2011-03-28 NOTE — Progress Notes (Signed)
Physical Therapy Evaluation Patient Details Name: Andre [name] MRN: [id number] DOB: 1950-05-17 Today's Date: 03/28/2011  Problem List: There is no problem list on file for this patient.   Past Medical History:  Past Medical History  Diagnosis Date  . Hypertension   . Stroke    Past Surgical History:  Past Surgical History  Procedure Date  . Abdominal surgery     PT Assessment/Plan/Recommendation PT Assessment Clinical Impression Statement: Pt s/p Lt cerebellar CVA with resulting decr balance and functional mobility.  Will benefit from PT to maximize independence and safety with mobility to prepare for ? ultimate d/c home with girlfriend. PT Recommendation/Assessment: Patient will need skilled PT in the acute care venue PT Problem List: Decreased strength;Decreased balance;Decreased mobility;Decreased coordination;Decreased cognition;Decreased knowledge of use of DME Barriers to Discharge: Decreased caregiver support Barriers to Discharge Comments: girlfriend with ability to provide supervision  PT Therapy Diagnosis : Difficulty walking PT Plan PT Frequency: Min 4X/week PT Treatment/Interventions: DME instruction;Gait training;Functional mobility training;Balance training;Neuromuscular re-education;Patient/family education PT Recommendation Recommendations for Other Services: Rehab consult Follow Up Recommendations: Inpatient Rehab Equipment Recommended: Defer to next venue PT Goals  Acute Rehab PT Goals PT Goal Formulation: With patient Time For Goal Achievement: 7 days Pt will Roll Supine to Left Side: with supervision PT Goal: Rolling Supine to Left Side - Progress: Other (comment) Pt will go Supine/Side to Sit: with supervision PT Goal: Supine/Side to Sit - Progress: Other (comment) Pt will Sit at [facility] of Bed: with supervision;3-5 min;with no upper extremity support PT Goal: Sit at Edge Of Bed - Progress: Other (comment) Pt will go Sit to Supine/Side: with  supervision;with HOB 0 degrees PT Goal: Sit to Supine/Side - Progress: Other (comment) Pt will Transfer Sit to Stand/Stand to Sit: with min assist;with upper extremity assist PT Transfer Goal: Sit to Stand/Stand to Sit - Progress: Other (comment) Pt will Transfer Bed to Chair/Chair to Bed: with min assist PT Transfer Goal: Bed to Chair/Chair to Bed - Progress: Other (comment) Pt will Stand: with min assist;3 - 5 min;with unilateral upper extremity support PT Goal: Stand - Progress: Other (comment) Pt will Ambulate: 16 - 50 feet;with +2 total assist;with least restrictive assistive device;Other (comment) (pt=70%) PT Goal: Ambulate - Progress: Other (comment)  PT Evaluation Precautions/Restrictions  Precautions Precautions: Fall Required Braces or Orthoses: No Restrictions Weight Bearing Restrictions: No Prior Functioning  Home Living Lives With: Significant other Receives Help From: Friend(s) Type of Home: Apartment Home Layout: One level Home Access: Stairs to enter Entrance Stairs-Rails: None Entrance Stairs-Number of Steps: 4-5 Home Adaptive Equipment: Straight cane Additional Comments: girlfriend reports pt uses cane at all times Prior Function Level of Independence: Independent with basic ADLs;Requires assistive device for independence Cognition Cognition Arousal/Alertness: Lethargic Overall Cognitive Status: Difficult to assess Difficult to assess due to: level of arousal Orientation Level: Oriented to person;Oriented to place;Oriented to situation Cognition - Other Comments: girlfriend answers most questions for him; pt mumbles/lethargic Sensation/Coordination Sensation Light Touch: Not tested Coordination Gross Motor Movements are Fluid and Coordinated: No Coordination and Movement Description: Pt very slow to respond to commands; movements of bil legs slow and jerky Heel Shin Test: pt unable to follow command due to lethargy Extremity Assessment RLE  Assessment RLE Assessment: Exceptions to [facility] RLE AROM (degrees) Overall AROM Right Lower Extremity: Deficits;Other (Comment) RLE Overall AROM Comments: due to lethargy; AAROM WFL RLE Strength RLE Overall Strength: Deficits RLE Overall Strength Comments: grossly 4/5 LLE Assessment LLE Assessment: Exceptions to [facility] LLE AROM (degrees)  Overall AROM Left Lower Extremity: Deficits;Due to decreased strength (due to lethargy) LLE Overall AROM Comments: AAROM WFL LLE Strength LLE Overall Strength: Deficits LLE Overall Strength Comments: grossly 3+/5  Mobility (including Balance) Bed Mobility Bed Mobility: Yes Rolling Right: 3: Mod assist Right Sidelying to Sit: 2: Max assist;HOB elevated (comment degrees) Right Sidelying to Sit Details (indicate cue type and reason): HOB 30 Sit to Supine - Right: 4: Min assist;HOB flat Transfers Transfers: Yes Sit to Stand: 3: Mod assist;With upper extremity assist;From bed;From elevated surface Sit to Stand Details (indicate cue type and reason): Pt leaning to Rt; very slow to extend hips and knees to come fully upright; wt-shifted to Rt upon standing Stand to Sit: 3: Mod assist;To bed;To elevated surface Ambulation/Gait Ambulation/Gait: No Stairs: No Wheelchair Mobility Wheelchair Mobility: No  Posture/Postural Control Posture/Postural Control: Postural limitations Postural Limitations: Leaning to Rt in sitting; able to identify leaning to Rt, however requires min to mod A to correct to midline and then slowly drifts back to Rt Balance Balance Assessed: Yes Static Sitting Balance Static Sitting - Balance Support: Right upper extremity supported;Feet supported Static Sitting - Level of Assistance: 3: Mod assist Static Sitting - Comment/# of Minutes: 4 Static Standing Balance Static Standing - Balance Support: Right upper extremity supported Static Standing - Level of Assistance: 3: Mod assist Static Standing - Comment/# of Minutes: 1 Exercise     End of Session PT - End of Session Equipment Utilized During Treatment: Gait belt Activity Tolerance: Other (comment) (lethargic) Patient left: in bed Nurse Communication: Mobility status for transfers General Behavior During Session: Lethargic Cognition: Impaired (difficult to assess due to lethargy, mumbling)  Nesreen Albano 03/28/2011, 5:24 PM Pager [phone]

## 2011-03-28 NOTE — Progress Notes (Signed)
UR Completed.  Jazsmin Couse Jane [phone] 03/28/2011  

## 2011-03-28 NOTE — Progress Notes (Signed)
Speech Language/Pathology Clinical/Bedside Swallow Evaluation Patient Details  Name: Andre [name] MRN: [id number] DOB: 12-Apr-1951 Today's Date: 03/28/2011  Past Medical History:  Past Medical History  Diagnosis Date  . Hypertension   . Stroke    Past Surgical History:  Past Surgical History  Procedure Date  . Abdominal surgery     Assessment/Recommendations/Treatment Plan Suspected Esophageal Findings Suspected Esophageal Findings: Belching  SLP Assessment Clinical Impression Statement: Pt presents with signs of pharyngeal dysphagia concerning for aspiration including wet vocal quality and multiple swallows.  Suspect pt may have signiificant pharyngeal residuals post swallow.  Pt will need objective test to determine best diet as thickened liquids may not hold any benefit for this pt.  Pt unable to complete testing today will defer till tomorrow morning.  Risk for Aspiration: Moderate  Recommendations Recommended Consults: MBS General Recommendation: NPO Oral Care Recommendations: Oral care QID   Harlon Ditty, MA CCC-SLP [phone]  Andre [name], Andre [name] 03/28/2011,1:56 PM

## 2011-03-28 NOTE — Progress Notes (Signed)
*  PRELIMINARY RESULTS*  Carotid Dopplers  has been performed.  No significant ICA stenosis bilaterally. Vertebral arteries are patent with antegrade flow.  Mila Homer 03/28/2011, 11:29 AM

## 2011-03-28 NOTE — Progress Notes (Signed)
2D Echocardiogram has been performed.  Juanita Laster Amare Bail, RDCS 03/28/2011, 9:24 AM

## 2011-03-28 NOTE — Progress Notes (Signed)
Reason for Consult: Cerebellar Stroke and AMS  Referring Physician: Dr. Mare Loan  Brief history patient is a 60 year old man brought to the ER after was found unresponsive- found to have acute cerebellar stroke and became hypoxic while in ER.  Lines/tubes  Culture data/sepsis markers  11/22>> BCx x2>>  11/22 >> Urine culture>>  Antibiotics  11/22>>Unasyn IV- ( poss Asp PNA )  Best practice  GI prophylaxis : Ppi  VTE prophylaxis: UFH  Protocols/consults  Neuro physician  Neurosurgery  Events/studies  Altered mental status- in ER-CT head-11/22- acute left cerebellar stroke.  Neuro physician and neurosurgery consult placed. 11/23- Etoh WD signs  I/O last 3 completed shifts: In: 1961.9 [I.V.:1811.7; IV Piggyback:150.2] Out: 2225 [Urine:2225]    Filed Vitals:   03/28/11 0800  BP: 182/100  Pulse: 110  Temp:   Resp: 28   General: Awake, alert, aggitated at times Neuro: nonfocal, not lethargic HEENT: jvd wnl PULM: ronchi rt  CV: s1 s2 RRR ZO:XWRU, Bs wnl , no r Extremities: no edema  CBC    Component Value Date/Time   WBC 12.4* 03/27/2011 1100   RBC 5.14 03/27/2011 1100   HGB 15.3 03/27/2011 1100   HCT 46.6 03/27/2011 1100   PLT 174 03/27/2011 1100   MCV 90.7 03/27/2011 1100   MCH 29.8 03/27/2011 1100   MCHC 32.8 03/27/2011 1100   RDW 13.8 03/27/2011 1100   LYMPHSABS 0.8 03/27/2011 1100   MONOABS 0.5 03/27/2011 1100   EOSABS 0.0 03/27/2011 1100   BASOSABS 0.0 03/27/2011 1100    BMET    Component Value Date/Time   NA 140 03/27/2011 1100   K 4.3 03/27/2011 1100   CL 98 03/27/2011 1100   CO2 18* 03/27/2011 1100   GLUCOSE 125* 03/27/2011 1100   BUN 31* 03/27/2011 1100   CREATININE 2.95* 03/27/2011 1100   CALCIUM 9.3 03/27/2011 1100   GFRNONAA 22* 03/27/2011 1100   GFRAA 25* 03/27/2011 1100   ABG    Component Value Date/Time   HCO3 23.1 03/27/2011 1138   TCO2 25 03/27/2011 1138   ACIDBASEDEF 4.0* 03/27/2011 1138   O2SAT 54.0 03/27/2011 1138   Dg  Chest 2 View  03/27/2011  *RADIOLOGY REPORT*  Clinical Data: Altered mental status.  Chest pain and shortness of breath.  CHEST - 2 VIEW  Comparison: 11/15/2010  Findings: Low lung volumes are seen with mild bibasilar atelectasis.  No evidence of pulmonary air space disease or pleural effusion.  Heart size is prominent but likely due to patient positioning and low lung volumes.  IMPRESSION: Low lung volumes with mild bibasilar atelectasis.  Original Report Authenticated By: Danae Orleans, M.D.   Ct Head Wo Contrast  03/27/2011  *RADIOLOGY REPORT*  Clinical Data: Altered mental status.  CT HEAD WITHOUT CONTRAST  Technique:  Contiguous axial images were obtained from the base of the skull through the vertex without contrast.  Comparison: 10/28/2010  Findings: There is new low density involving the left cerebellar hemisphere suggesting edema.  No significant mass effect.  There may be a small lacune in the left basal ganglia and left thalamus. There is a subtle area of low density in the left white matter tract that may be old.  No evidence for acute hemorrhage, midline shift or hydrocephalus.  Paranasal sinuses are clear.  No acute bony abnormality.  IMPRESSION: New low density involving the left cerebellar hemisphere.  Findings are suggestive for edema and acute/subacute infarct.  Evidence for scattered lacunar injuries of unknown age.  These results were  called by telephone on 03/27/2011  at  11:04 a.m. to  Dr.Jacubowitz, who verbally acknowledged these results.  Original Report Authenticated By: Richarda Overlie, M.D.    Assessment and Plan  1) Acute cerebellar nonhemorrhagic CVA: Unknown etiology. Blood pressure mildly elevated. Might be from temporary severe hypertension.  No mass effect in fourth ventricle or hydrocephalus at present.  Plan:  - Stroke protocol and stroke team followup  - NS recs reviewed Will we need repeat CT? ASA to start Echo on order ETOH WD, currently on CIWA, I think this pt  will require MD driven treatment of WD, especially with CT findings cva? Thiamine, folic, mVI  2) Altered mental status: Likely secondary to accommodation of #1 and #5-alcoholism.  Plan:  - As per #1 and 6   3) Hypoxic respiratory distress: Patient desaturated to 83% earlier in ER. Venous blood gas was obtained which shows PO2 of 54.  Though maintaining his airways and has gag reflex but might have aspirated when he vomited in ER.  Plan:  No disstres Mild ATX pcxr in am  IS when able  4) ID: Possible aspiration pneumonia following acute cerebellar stroke and vomiting episode.  Leukocytosis- WBC 11.4  Also UTI as #5.  Plan:  Empiric asp cov pcxr to follow Afebrile, if temp soikes, control  5) Acute kidney injury : Likely multifactorial etiology - dehydration, UTI, and maybe alcohol-induced.  UA and microscopy shows signs of UTI.  Hyaline casts suggests dehydration etiology .  Low suspicion of rubber alcohol ingestion.  Plan:  -rising crt ATN? With underlying CRI? Renal US NS to 50 again  No free water with cva posteior bmet now   6) Substance abuse: Blood Alcohol level 149.  UDS positive for marijuana.  Plan:  - dc CIWA protocol...add MD driven ativan and consider breakthrough versed or dex if fail  - IV thiamine and folate until takes by mouth.   7) GI: Prophylaxis  Plan:  - IV Protonix.  - IV Zofran for nausea/vomiting   Remain in ICU needed  Ccm time 30 min abg now cmet now , none today noted  Mcarthur Rossetti. Tyson Alias, MD, FACP Pgr: [phone] [facility]

## 2011-03-28 NOTE — Progress Notes (Signed)
SUBJECTIVE  Andre [name] is a 60 y.o. male who was brought in by her and after being found unresponsive at home after a night of drinking. He was lethargic and urine drug screen was positive for alcohol and cannabis. He is more alert today but denies any focal neurological symptoms. He states he had noticed that he was off balance for several days. Denied any slurred speech headache or extremity weakness. He states he had a stroke earlier this year but is unable to give me details. OBJECTIVE Most recent Vital Signs: Temp: 98.2 F (36.8 C) (11/23 0723) Temp src: Oral (11/23 0723) BP: 182/100 mmHg (11/23 0800) Pulse Rate: 110  (11/23 0800) Respiratory Rate: 28 O2 Saturdation: 95%  CBG (last 3)  No results found for this basename: GLUCAP:3 in the last 72 hours Intake/Output from previous day: 11/22 0701 - 11/23 0700 In: 1961.9 [I.V.:1811.7; IV Piggyback:150.2] Out: 2225 [Urine:2225]  IV Fluid Intake:     . DISCONTD: sodium chloride Stopped (03/27/11 1327)   Diet:  NPO    Activity:  Up with assistance  DVT Prophylaxis:  SQ heparin  Studies: CBC    Component Value Date/Time   WBC 12.4* 03/27/2011 1100   RBC 5.14 03/27/2011 1100   HGB 15.3 03/27/2011 1100   HCT 46.6 03/27/2011 1100   PLT 174 03/27/2011 1100   MCV 90.7 03/27/2011 1100   MCH 29.8 03/27/2011 1100   MCHC 32.8 03/27/2011 1100   RDW 13.8 03/27/2011 1100   LYMPHSABS 0.8 03/27/2011 1100   MONOABS 0.5 03/27/2011 1100   EOSABS 0.0 03/27/2011 1100   BASOSABS 0.0 03/27/2011 1100   CMP    Component Value Date/Time   NA 140 03/27/2011 1100   K 4.3 03/27/2011 1100   CL 98 03/27/2011 1100   CO2 18* 03/27/2011 1100   GLUCOSE 125* 03/27/2011 1100   BUN 31* 03/27/2011 1100   CREATININE 2.95* 03/27/2011 1100   CALCIUM 9.3 03/27/2011 1100   PROT 8.3 03/27/2011 1100   ALBUMIN 3.9 03/27/2011 1100   AST 68* 03/27/2011 1100   ALT 43 03/27/2011 1100   ALKPHOS 56 03/27/2011 1100   BILITOT 0.3 03/27/2011 1100   GFRNONAA 22* 03/27/2011 1100   GFRAA 25* 03/27/2011 1100   COAGS Lab Results  Component Value Date   INR 1.0 06/27/2008   Lipid Panel    Component Value Date/Time   CHOL 199 03/28/2011 0348   TRIG 262* 03/28/2011 0348   HDL 70 03/28/2011 0348   CHOLHDL 2.8 03/28/2011 0348   VLDL 52* 03/28/2011 0348   LDLCALC 77 03/28/2011 0348   HgbA1C  No results found for this basename: HGBA1C   Urine Drug Screen     Component Value Date/Time   LABOPIA NONE DETECTED 03/27/2011 1149   COCAINSCRNUR NONE DETECTED 03/27/2011 1149   LABBENZ NONE DETECTED 03/27/2011 1149   AMPHETMU NONE DETECTED 03/27/2011 1149   THCU POSITIVE* 03/27/2011 1149   LABBARB NONE DETECTED 03/27/2011 1149    Alcohol Level    Component Value Date/Time   ETH 149* 03/27/2011 1100     Dg Chest 2 View  03/27/2011  *RADIOLOGY REPORT*  Clinical Data: Altered mental status.  Chest pain and shortness of breath.  CHEST - 2 VIEW  Comparison: 11/15/2010  Findings: Low lung volumes are seen with mild bibasilar atelectasis.  No evidence of pulmonary air space disease or pleural effusion.  Heart size is prominent but likely due to patient positioning and low lung volumes.  IMPRESSION: Low lung volumes  with mild bibasilar atelectasis.  Original Report Authenticated By: Danae Orleans, M.D.   Ct Head Wo Contrast  03/27/2011  *RADIOLOGY REPORT*  Clinical Data: Altered mental status.  CT HEAD WITHOUT CONTRAST  Technique:  Contiguous axial images were obtained from the base of the skull through the vertex without contrast.  Comparison: 10/28/2010  Findings: There is new low density involving the left cerebellar hemisphere suggesting edema.  No significant mass effect.  There may be a small lacune in the left basal ganglia and left thalamus. There is a subtle area of low density in the left white matter tract that may be old.  No evidence for acute hemorrhage, midline shift or hydrocephalus.  Paranasal sinuses are clear.  No acute bony  abnormality.  IMPRESSION: New low density involving the left cerebellar hemisphere.  Findings are suggestive for edema and acute/subacute infarct.  Evidence for scattered lacunar injuries of unknown age.  These results were called by telephone on 03/27/2011  at  11:04 a.m. to  Dr.Jacubowitz, who verbally acknowledged these results.  Original Report Authenticated By: Richarda Overlie, M.D.       MRI of the brain:  pending  MRA of the brain: pending 2D Echocardiogram: being done Carotid Doppler: pending    EKG:  normal EKG, normal sinus rhythm, unchanged from previous tracings, normal sinus rhythm.   Physical Exam:    We'll H. African American gentleman currently not in distress. Afebrile. Distal pulses are felt. There is no pedal edema. Head is nontraumatic without bruit.  Cardiac exam regular heart sounds no murmur or gallop. Lungs are clear to auscultation.  Neurological exam drowsy but rouses easily and follows commands well. He is oriented to place and person but not to time. He has diminished attention, short-term memory and recall. Eye movements are full range without nystagmus. He blinks to threat bilaterally. There is no facial weakness. He moves upper and lower extremities well against gravity but is not cooperative for details power testing. Deep tendon  reflexes 1+ symmetric plantars are downgoing. Sensation and coordination appears intact. Gait was not tested.  ASSESSMENT [name] is a 60 y.o. male with a  Subacute left cerebellar infarct exact timing undetermined at this time. The stroke workup is pending. He has significant history of alcohol and drug abuse. Stroke risk factors:  hypertension  Hospital day # 1  TREATMENT/PLAN Start aspirin 325 mg orally every day for stroke prevention after swallow eval. Continue alcohol withdrawal precautions. Check echocardiogram, Doppler studies. Patient may not be cooperative forr MRI hence we'll discontinue it and repeat a CT  scan. Check carotid Dopplers studies also. Stroke team will follow. Call for questions  Gates Rigg, MD [facility] Pager: [phone] 03/28/2011 9:01 AM

## 2011-03-29 ENCOUNTER — Inpatient Hospital Stay (HOSPITAL_COMMUNITY): Payer: Medicaid Other

## 2011-03-29 DIAGNOSIS — I6340 Cerebral infarction due to embolism of unspecified cerebral artery: Secondary | ICD-10-CM

## 2011-03-29 DIAGNOSIS — R4182 Altered mental status, unspecified: Secondary | ICD-10-CM

## 2011-03-29 DIAGNOSIS — I10 Essential (primary) hypertension: Secondary | ICD-10-CM

## 2011-03-29 LAB — COMPREHENSIVE METABOLIC PANEL
ALT: 83 U/L — ABNORMAL HIGH (ref 0–53)
AST: 161 U/L — ABNORMAL HIGH (ref 0–37)
Alkaline Phosphatase: 50 U/L (ref 39–117)
CO2: 26 mEq/L (ref 19–32)
Calcium: 9.5 mg/dL (ref 8.4–10.5)
GFR calc Af Amer: 69 mL/min — ABNORMAL LOW (ref >90)
GFR calc non Af Amer: 60 mL/min — ABNORMAL LOW (ref >90)
Glucose, Bld: 93 mg/dL (ref 70–99)
Potassium: 4.3 mEq/L (ref 3.5–5.1)
Sodium: 145 mEq/L (ref 135–145)
Total Protein: 7.1 g/dL (ref 6.0–8.3)

## 2011-03-29 LAB — BLOOD GAS, ARTERIAL
Acid-Base Excess: 2.3 mmol/L — ABNORMAL HIGH (ref 0.0–2.0)
Bicarbonate: 26.3 mEq/L — ABNORMAL HIGH (ref 20.0–24.0)
O2 Saturation: 94.5 %
Patient temperature: 98.6
TCO2: 27.6 mmol/L (ref 0–100)

## 2011-03-29 LAB — CBC
HCT: 42.4 % (ref 39.0–52.0)
Hemoglobin: 13.4 g/dL (ref 13.0–17.0)
MCH: 29.6 pg (ref 26.0–34.0)
MCHC: 31.6 g/dL (ref 30.0–36.0)
RDW: 13.6 % (ref 11.5–15.5)

## 2011-03-29 LAB — DIFFERENTIAL
Basophils Absolute: 0 10*3/uL (ref 0.0–0.1)
Basophils Relative: 0 % (ref 0–1)
Eosinophils Absolute: 0.1 10*3/uL (ref 0.0–0.7)
Monocytes Absolute: 0.8 10*3/uL (ref 0.1–1.0)
Monocytes Relative: 8 % (ref 3–12)
Neutro Abs: 6.2 10*3/uL (ref 1.7–7.7)

## 2011-03-29 MED ORDER — HYDRALAZINE HCL 20 MG/ML IJ SOLN
10.0000 mg | INTRAMUSCULAR | Status: DC | PRN
  Administered 2011-03-29: 10 mg via INTRAVENOUS
  Filled 2011-03-29 (×3): qty 1

## 2011-03-29 MED ORDER — CLONIDINE HCL 0.2 MG/24HR TD PTWK
0.2000 mg | MEDICATED_PATCH | TRANSDERMAL | Status: DC
  Administered 2011-03-29: 0.2 mg via TRANSDERMAL
  Filled 2011-03-29: qty 1

## 2011-03-29 MED ORDER — METOPROLOL TARTRATE 1 MG/ML IV SOLN
2.5000 mg | Freq: Three times a day (TID) | INTRAVENOUS | Status: DC
  Administered 2011-03-29 (×2): 2.5 mg via INTRAVENOUS
  Filled 2011-03-29 (×2): qty 5

## 2011-03-29 MED ORDER — SODIUM CHLORIDE 0.9 % IV SOLN
3.0000 g | Freq: Four times a day (QID) | INTRAVENOUS | Status: DC
  Administered 2011-03-29 – 2011-03-31 (×8): 3 g via INTRAVENOUS
  Filled 2011-03-29 (×10): qty 3

## 2011-03-29 MED ORDER — METOPROLOL TARTRATE 1 MG/ML IV SOLN
5.0000 mg | Freq: Three times a day (TID) | INTRAVENOUS | Status: DC
  Administered 2011-03-29 – 2011-03-30 (×2): 5 mg via INTRAVENOUS
  Filled 2011-03-29 (×4): qty 5

## 2011-03-29 NOTE — Progress Notes (Signed)
SUBJECTIVE Andre [name] is a 60 y.o. maleHerbert P [name] is a 60 y.o. male who was brought in by EMS on 03-27-11 .  - found unresponsive at home after a night of drinking. He was lethargic and urine drug screen was positive for alcohol and cannabis. He is more alert today but denies any focal neurological symptoms. He states he had noticed that he was off balance for several days. Denied any slurred speech headache or extremity weakness.  He states he had a stroke earlier this year but is unable to give Dr Pearlean Brownie  details.   OBJECTIVE : patient is agitated and hypertensive, confused.     Most recent Vital Signs: BP: 162/93 mmHg (11/24 1149) Pulse Rate: 83  (11/24 1000) Respiratory Rate: 16 O2 Saturdation: 98%  CBG (last 3)  No results found for this basename: GLUCAP:3 in the last 72 hours Intake/Output from previous day: 11/23 0701 - 11/24 0700 In: 1850 [I.V.:1750; IV Piggyback:100] Out: 2200 [Urine:2200]  IV Fluid Intake:     0.9% NaCl   75 mL/hr at 03/29/11 1200   Diet:  Pureed food .  Activity: Up with assistance, only when patient is calmer.  DVT Prophylaxis:   Lovenox .  Studies: Results for orders placed during the hospital encounter of 03/27/11 (from the past 24 hour(s))  BLOOD GAS, ARTERIAL     Status: Abnormal   Collection Time   03/28/11  1:20 PM      Component Value Range   O2 Content 2.0     Delivery systems NASAL CANNULA     pH, Arterial 7.411  7.350 - 7.450    pCO2 arterial 45.0  35.0 - 45.0 (mmHg)   pO2, Arterial 82.5  80.0 - 100.0 (mmHg)   Bicarbonate 28.0 (*) 20.0 - 24.0 (mEq/L)   TCO2 29.3  0 - 100 (mmol/L)   Acid-Base Excess 3.6 (*) 0.0 - 2.0 (mmol/L)   O2 Saturation 96.7     Patient temperature 98.6     Collection site LEFT RADIAL     Drawn by [id number]     Sample type ARTERIAL DRAW     Allens test (pass/fail) PASS  PASS   BLOOD GAS, ARTERIAL     Status: Abnormal   Collection Time   03/29/11  4:37 AM      Component Value Range   O2  Content 28.0     Delivery systems NASAL CANNULA     pH, Arterial 7.422  7.350 - 7.450    pCO2 arterial 41.2  35.0 - 45.0 (mmHg)   pO2, Arterial 70.9 (*) 80.0 - 100.0 (mmHg)   Bicarbonate 26.3 (*) 20.0 - 24.0 (mEq/L)   TCO2 27.6  0 - 100 (mmol/L)   Acid-Base Excess 2.3 (*) 0.0 - 2.0 (mmol/L)   O2 Saturation 94.5     Patient temperature 98.6     Collection site LEFT RADIAL     Drawn by [id number]     Sample type ARTERIAL DRAW     Allens test (pass/fail) PASS  PASS   COMPREHENSIVE METABOLIC PANEL     Status: Abnormal   Collection Time   03/29/11  5:00 AM      Component Value Range   Sodium 145  135 - 145 (mEq/L)   Potassium 4.3  3.5 - 5.1 (mEq/L)   Chloride 109  96 - 112 (mEq/L)   CO2 26  19 - 32 (mEq/L)   Glucose, Bld 93  70 - 99 (mg/dL)   BUN  20  6 - 23 (mg/dL)   Creatinine, Ser 1.61  0.50 - 1.35 (mg/dL)   Calcium 9.5  8.4 - 09.6 (mg/dL)   Total Protein 7.1  6.0 - 8.3 (g/dL)   Albumin 3.3 (*) 3.5 - 5.2 (g/dL)   AST 045 (*) 0 - 37 (U/L)   ALT 83 (*) 0 - 53 (U/L)   Alkaline Phosphatase 50  39 - 117 (U/L)   Total Bilirubin 0.9  0.3 - 1.2 (mg/dL)   GFR calc non Af Amer 60 (*) >90 (mL/min)   GFR calc Af Amer 69 (*) >90 (mL/min)  CBC     Status: Abnormal   Collection Time   03/29/11  5:00 AM      Component Value Range   WBC 9.1  4.0 - 10.5 (K/uL)   RBC 4.52  4.22 - 5.81 (MIL/uL)   Hemoglobin 13.4  13.0 - 17.0 (g/dL)   HCT 40.9  81.1 - 91.4 (%)   MCV 93.8  78.0 - 100.0 (fL)   MCH 29.6  26.0 - 34.0 (pg)   MCHC 31.6  30.0 - 36.0 (g/dL)   RDW 78.2  95.6 - 21.3 (%)   Platelets 146 (*) 150 - 400 (K/uL)  DIFFERENTIAL     Status: Normal   Collection Time   03/29/11  5:00 AM      Component Value Range   Neutrophils Relative 68  43 - 77 (%)   Neutro Abs 6.2  1.7 - 7.7 (K/uL)   Lymphocytes Relative 22  12 - 46 (%)   Lymphs Abs 2.0  0.7 - 4.0 (K/uL)   Monocytes Relative 8  3 - 12 (%)   Monocytes Absolute 0.8  0.1 - 1.0 (K/uL)   Eosinophils Relative 1  0 - 5 (%)   Eosinophils  Absolute 0.1  0.0 - 0.7 (K/uL)   Basophils Relative 0  0 - 1 (%)   Basophils Absolute 0.0  0.0 - 0.1 (K/uL)     Ct Head Wo Contrast  03/28/2011  *RADIOLOGY REPORT*  Clinical Data: Stroke.  CT HEAD WITHOUT CONTRAST  Technique:  Contiguous axial images were obtained from the base of the skull through the vertex without contrast.  Comparison: 03/27/2011 and 10/28/2010  Findings: Again noted is the nonhemorrhagic infarction in the left cerebellar hemisphere with slightly increased mass effect upon the fourth ventricle but the fourth ventricle is not occluded.  No hemorrhage.  The remainder of the brain is unchanged with no dilatation of the third or lateral ventricles.  Tiny old lacunar infarcts in the left basal ganglia.  No atrophy.  No osseous abnormalities.  IMPRESSION: Slight increased mass effect upon the fourth ventricle by the nonhemorrhagic infarction in the left cerebellar hemisphere.  No ventricular obstruction.  No other change.  Original Report Authenticated By: Gwynn Burly, M.D.   US Renal  03/28/2011  *RADIOLOGY REPORT*  Clinical Data: Renal failure.  RENAL/URINARY TRACT ULTRASOUND COMPLETE  Comparison:  None.  Findings:  Right Kidney:  10.8 cm in length.  Normal.  Left Kidney:  Normal.  11.1 cm in length.  Bladder:  Empty.  Foley catheter in place.  IMPRESSION: Normal renal ultrasound.  Original Report Authenticated By: Gwynn Burly, M.D.    Physical Exam:  [name] currently restless, agitated ,   Not physically in distress. Distal pulses are felt. There is no pedal edema. Head is nontraumatic without teeth.  Patient unable to answer questions as to date, time of day and day  of the week.  Knew he is at [facility] .  Cardiac exam regular heart sounds no murmur or gallop. Lungs are clear to auscultation. Blood pressure remains elevated on Clonidine patch and ativan  Prophylaxis for DTs.  Neurological exam : drowsy follows commands delayed and asks questions  repetitively.  He has diminished attention, short-term memory and recall.  Eye movements are full range with coarse  Horizontal saccadic eye movements are coarse. He blinks to threat bilaterally. There is no lower  facial weakness. He moves upper and lower extremities well against gravity but is not cooperative for details power testing.  Deep tendon reflexes 1+ symmetric plantars are downgoing.   Patient was unable to sit up straight unassisted and immediately resumed a reclined position. Truncal ataxic . Gait was not tested.     ASSESSMENT Andre [name] is a 60 y.o. male with a Subacute left cerebellar infarct exact timing undetermined at this time.  The stroke work-up is almost concluded-  He has significant history of alcohol and drug abuse and was admitted intoxicated .Marland Kitchen  Stroke risk factors: hypertension. ETOH and drug abuse,    Hospital day # 2  TREATMENT/PLAN  Started aspirin 325 mg orally every day for stroke prevention after he passed his swallow eval.  Speech therapist recommendet pureed diet for edentulous patient.  Continue alcohol withdrawal precautions with Ativan and Clonidin.  Checked echocardiogram ( Ef 50-55% ) ,  Doppler studies were read preliminary as normal  .Patient may not cooperative for MRI hence  repeated a CT scan this AM-  unchanged, cerebellar infarct without haemorrhagic conversion , not obstructing the ventricle. .  Stroke team follow up ,    Melvyn Novas , M.D. [facility]   [facility] Pager: [phone] 03/29/2011 1:02 PM

## 2011-03-29 NOTE — Progress Notes (Signed)
Reason for Consult: Cerebellar Stroke and AMS  Referring Physician: Dr. Mare Loan  Brief history patient is a 60 year old man brought to the ER after was found unresponsive- found to have acute cerebellar stroke and became hypoxic while in ER.  Lines/tubes  Culture data/sepsis markers  11/22>> BCx x2>> ngtd>> 11/22 >> Urine culture>> not done Antibiotics  11/22>>Unasyn IV- ( poss Asp PNA )  Best practice  GI prophylaxis : Ppi  VTE prophylaxis: UFH  Protocols/consults  Neuro physician  Neurosurgery  Events/studies  Altered mental status- in ER-CT head-11/22- acute left cerebellar stroke.  Neuro physician and neurosurgery consult placed. 11/23- Etoh WD signs  I/O last 3 completed shifts: In: 2461.9 [I.V.:2311.7; IV Piggyback:150.2] Out: 3150 [Urine:3150] Total I/O In: 1375 [I.V.:1275; IV Piggyback:100] Out: 1275 [Urine:1275]  Filed Vitals:   03/29/11 0600  BP: 151/135  Pulse: 99  Temp:   Resp: 25   General: Awake, alert, aggitated at times, wants water Neuro: nonfocal, not lethargic HEENT: jvd wnl PULM: ronchi rt  CV: s1 s2 RRR ZO:XWRU, Bs wnl , no r Extremities: no edema  CBC    Component Value Date/Time   WBC 12.4* 03/27/2011 1100   RBC 5.14 03/27/2011 1100   HGB 15.3 03/27/2011 1100   HCT 46.6 03/27/2011 1100   PLT 174 03/27/2011 1100   MCV 90.7 03/27/2011 1100   MCH 29.8 03/27/2011 1100   MCHC 32.8 03/27/2011 1100   RDW 13.8 03/27/2011 1100   LYMPHSABS 0.8 03/27/2011 1100   MONOABS 0.5 03/27/2011 1100   EOSABS 0.0 03/27/2011 1100   BASOSABS 0.0 03/27/2011 1100    BMET    Component Value Date/Time   NA 146* 03/28/2011 0348   K 4.4 03/28/2011 0348   CL 110 03/28/2011 0348   CO2 23 03/28/2011 0348   GLUCOSE 105* 03/28/2011 0348   BUN 28* 03/28/2011 0348   CREATININE 1.79* 03/28/2011 0348   CALCIUM 8.8 03/28/2011 0348   GFRNONAA 40* 03/28/2011 0348   GFRAA 46* 03/28/2011 0348   ABG    Component Value Date/Time   PHART 7.422 03/29/2011 0437     PCO2ART 41.2 03/29/2011 0437   PO2ART 70.9* 03/29/2011 0437   HCO3 26.3* 03/29/2011 0437   TCO2 27.6 03/29/2011 0437   ACIDBASEDEF 4.0* 03/27/2011 1138   O2SAT 94.5 03/29/2011 0437   Dg Chest 2 View  03/27/2011  *RADIOLOGY REPORT*  Clinical Data: Altered mental status.  Chest pain and shortness of breath.  CHEST - 2 VIEW  Comparison: 11/15/2010  Findings: Low lung volumes are seen with mild bibasilar atelectasis.  No evidence of pulmonary air space disease or pleural effusion.  Heart size is prominent but likely due to patient positioning and low lung volumes.  IMPRESSION: Low lung volumes with mild bibasilar atelectasis.  Original Report Authenticated By: Danae Orleans, M.D.   Ct Head Wo Contrast  03/27/2011  *RADIOLOGY REPORT*  Clinical Data: Altered mental status.  CT HEAD WITHOUT CONTRAST  Technique:  Contiguous axial images were obtained from the base of the skull through the vertex without contrast.  Comparison: 10/28/2010  Findings: There is new low density involving the left cerebellar hemisphere suggesting edema.  No significant mass effect.  There may be a small lacune in the left basal ganglia and left thalamus. There is a subtle area of low density in the left white matter tract that may be old.  No evidence for acute hemorrhage, midline shift or hydrocephalus.  Paranasal sinuses are clear.  No acute bony abnormality.  IMPRESSION:  New low density involving the left cerebellar hemisphere.  Findings are suggestive for edema and acute/subacute infarct.  Evidence for scattered lacunar injuries of unknown age.  These results were called by telephone on 03/27/2011  at  11:04 a.m. to  Dr.Jacubowitz, who verbally acknowledged these results.  Original Report Authenticated By: Richarda Overlie, M.D.    Assessment and Plan  1) Acute cerebellar nonhemorrhagic CVA: Unknown etiology. Blood pressure  elevated. Might be from temporary severe hypertension.  No mass effect in fourth ventricle or  hydrocephalus at present.  Plan:  - Stroke protocol and stroke team followup  - NS recs reviewed Will we need repeat CT? ASA to start Echo on order ETOH WD, currently on CIWA, I think this pt will require MD driven treatment of WD, especially with CT findings cva? Thiamine, folic, mVI Lopressor iv q 6h start 11/24. Add catapress tts2  2) Altered mental status: Likely secondary to accommodation of #1 and #5-alcoholism.  Plan:  - As per #1 and 6   3) Hypoxic respiratory distress: Patient desaturated to 83% earlier in ER. Venous blood gas was obtained which shows PO2 of 54.  Though maintaining his airways and has gag reflex but might have aspirated when he vomited in ER.  Plan:  No disstres Mild ATX  IS when able  4) ID: Possible aspiration pneumonia following acute cerebellar stroke and vomiting episode.  Leukocytosis- WBC 11.4  Also UTI as #5.  Plan:  Empiric asp cov pcxr to follow Afebrile, if temp spikes, add hcai interventions  5) Acute kidney injury : Likely multifactorial etiology - dehydration, UTI, and maybe alcohol-induced.  UA and microscopy shows signs of UTI.  Hyaline casts suggests dehydration etiology .  Low suspicion of rubbing alcohol ingestion.  Plan:  -rising crt ATN? With underlying CRI? Renal US normal NS 75  No free water with cva posteior (no ngt) bmet follow  Lab Results  Component Value Date   CREATININE 1.79* 03/28/2011   CREATININE 2.95* 03/27/2011   CREATININE 1.22 04/11/2010    6) Substance abuse: Blood Alcohol level 149.  UDS positive for marijuana.  Plan:  - dc CIWA protocol...add MD driven ativan and consider breakthrough versed or dex if fail  - IV thiamine and folate until takes by mouth.   7) GI: Prophylaxis  Plan:  - IV Protonix.  - IV Zofran for nausea/vomiting   Remain in ICU needed till bp better controlled   Orlean Bradford, M.D. Pulmonary and Critical Care Medicine [facility] Cell: [phone]

## 2011-03-29 NOTE — Procedures (Signed)
Modified Barium Swallow Procedure Note Patient Details  Name: Andre [name] MRN: [id number] Date of Birth: 1950-09-10  Today's Date: 03/29/2011 Time: 4098-1191 Time Calculation (min): 26 min  Past Medical History:  Past Medical History  Diagnosis Date  . Hypertension   . Stroke    Past Surgical History:  Past Surgical History  Procedure Date  . Abdominal surgery    HPI: See BSE please  Ct Head Wo Contrast  03/28/2011  *RADIOLOGY REPORT*  Clinical Data: Stroke.  CT HEAD WITHOUT CONTRAST  Technique:  Contiguous axial images were obtained from the base of the skull through the vertex without contrast.  Comparison: 03/27/2011 and 10/28/2010  Findings: Again noted is the nonhemorrhagic infarction in the left cerebellar hemisphere with slightly increased mass effect upon the fourth ventricle but the fourth ventricle is not occluded.  No hemorrhage.  The remainder of the brain is unchanged with no dilatation of the third or lateral ventricles.  Tiny old lacunar infarcts in the left basal ganglia.  No atrophy.  No osseous abnormalities.  IMPRESSION: Slight increased mass effect upon the fourth ventricle by the nonhemorrhagic infarction in the left cerebellar hemisphere.  No ventricular obstruction.  No other change.  Original Report Authenticated By: Gwynn Burly, M.D.    Recommendation/Prognosis  Clinical Impression Dysphagia Diagnosis: Mild oral phase dysphagia;Mild pharyngeal phase dysphagia;Moderate cervical esophageal phase dysphagia Clinical impression: MBS completed. Full report to follow.  Pt with minimal oral and mild pharyngeal dysphagia without aspiration or penetration of any consistency tested.  Dysphagia appears to be characterized by mostly decreased UES opening/relaxation resulting in moderate pharyngeal stasis (pyriform more than vallecular) that pt generally sensed.  Suspect decreased intraoral pressure may contribute to pt's pharyngeal residuals as well, as he is  dysarthric.  Retained secretions noted in pyriform sinus as well without pt awareness.  Reflexive dry swallows help to decrease residuals but did not fully eliminate it.  Pt reports dysphagia x1 month but unable to identify medical issue/change as source, suspect CVA and ETOH usage contributes.   As pt is edentulous and is dysarthric, recommend pt initiate diet of Mechanical soft/ground meats with thin liquids with aspiration precautions.  Allow pt time to conduct 2-3 swallows with each bolus.  Reflux precautions.  Suspect component of chronic dysphagia given pt's report.  Also recommend medications be crushed if not contraindicated and given in puree or ice-cream and follow with water.    Recommendations Solid Consistency: Dysphagia 3 (Mechanical soft) (ground meats) Liquid Consistency: Thin Medication Administration: Other (Comment) (crush (if not contraind) with puree/icecream follow w/water) Supervision: Patient able to self feed (will need supervision to assure slow intake pt is impulsive) Compensations: Slow rate;Small sips/bites;Multiple dry swallows after each bite/sip   Postural Changes and/or Swallow Maneuvers: Seated upright 90 degrees;Upright 30-60 min after meal Oral Care Recommendations: Oral care QID Prognosis Prognosis for Safe Diet Advancement: Fair Barriers to Reach Goals: Behavior Individuals Consulted Consulted and Agree with Results and Recommendations: Patient  SLP Assessment/Plan  Tx twice a week for dysphagia management and to maximize airway protection.    SLP Goals   Pt will consume soft/thin diet utilizing compensatory strategies to maximize airway protection with moderate assistance.    General:  Date of Onset: 03/28/11 Other Pertinent Information: Please see BSE completed 03/28/11.  Type of Study: Initial MBS Diet Prior to this Study: NPO Respiratory Status: Room air Behavior/Cognition: Alert;Impulsive;Distractible Oral Cavity - Dentition: Edentulous  (lower one tooth viewed ) Vision: Functional for self-feeding Patient Positioning: Postural  control adequate for testing Baseline Vocal Quality: Hoarse Volitional Cough: Strong Volitional Swallow: Able to elicit Ice chips: Not tested  Reason for Referral: to evaluate swallow ability  Oral Phase Oral Preparation/Oral Phase Oral Phase: Impaired Oral - Nectar Oral - Nectar Cup: Delayed oral transit;Other (Comment);Weak lingual manipulation (mild) Oral - Thin Oral - Thin Teaspoon: Delayed oral transit;Weak lingual manipulation Oral - Thin Cup: Delayed oral transit;Weak lingual manipulation Oral - Thin Straw: Delayed oral transit;Weak lingual manipulation Oral - Solids Oral - Puree: Delayed oral transit;Weak lingual manipulation Oral - Regular: Delayed oral transit;Impaired mastication;Weak lingual manipulation Pharyngeal Phase  Pharyngeal Phase Pharyngeal Phase: Impaired Pharyngeal - Nectar Pharyngeal - Nectar Cup: Pharyngeal residue - pyriform;Pharyngeal residue - valleculae Pharyngeal - Thin Pharyngeal - Thin Teaspoon: Pharyngeal residue - pyriform;Pharyngeal residue - valleculae;Lateral channel residue Pharyngeal - Thin Cup: Pharyngeal residue - pyriform;Pharyngeal residue - valleculae;Lateral channel residue Pharyngeal - Thin Straw: Pharyngeal residue - pyriform;Pharyngeal residue - valleculae;Lateral channel residue Pharyngeal - Solids Pharyngeal - Puree: Pharyngeal residue - pyriform;Pharyngeal residue - valleculae Pharyngeal - Regular: Pharyngeal residue - pyriform;Pharyngeal residue - valleculae Pharyngeal Phase - Comment Pharyngeal Comment: reflexive dry swallows decrease stasis Cervical Esophageal Phase  Cervical Esophageal Phase Cervical Esophageal Phase: Impaired Cervical Esophageal Phase - Nectar Nectar Cup: Reduced cricopharyngeal relaxation Cervical Esophageal Phase - Thin Thin Teaspoon: Reduced cricopharyngeal relaxation Thin Cup: Reduced cricopharyngeal  relaxation Thin Straw: Reduced cricopharyngeal relaxation Cervical Esophageal Phase - Solids Puree: Reduced cricopharyngeal relaxation Regular: Reduced cricopharyngeal relaxation Cervical Esophageal Phase - Comment Cervical Esophageal Comment: Reflexive dry swallows help to decrease stasis, pt with secretions retained in pharynx     Chales Abrahams 03/29/2011, 12:24 PM

## 2011-03-29 NOTE — Progress Notes (Signed)
ANTIBIOTIC CONSULT NOTE - FOLLOW-UP  Pharmacy Consult for Unasyn Indication: r/o aspiration PNA and possible UTI  No Known Allergies  Patient Measurements:   Vital Signs: BP: 151/135 mmHg (11/24 0600) Pulse Rate: 99  (11/24 0600) Intake/Output from previous day: 11/23 0701 - 11/24 0700 In: 1775 [I.V.:1675; IV Piggyback:100] Out: 2200 [Urine:2200] Intake/Output from this shift: Total I/O In: 75 [I.V.:75] Out: 75 [Urine:75]  Labs:  Basename 03/29/11 0500 03/28/11 0348 03/27/11 1100  WBC 9.1 -- 12.4*  HGB 13.4 -- 15.3  PLT 146* -- 174  LABCREA -- -- --  CREATININE 1.29 1.79* 2.95*   Estimated CrCl is ~27 ml/min without height and weight  No results found for this basename: VANCOTROUGH:2,VANCOPEAK:2,VANCORANDOM:2,GENTTROUGH:2,GENTPEAK:2,GENTRANDOM:2,TOBRATROUGH:2,TOBRAPEAK:2,TOBRARND:2,AMIKACINPEAK:2,AMIKACINTROU:2,AMIKACIN:2, in the last 72 hours   Microbiology: Recent Results (from the past 720 hour(s))  CULTURE, BLOOD (ROUTINE X 2)     Status: Normal (Preliminary result)   Collection Time   03/27/11 12:40 PM      Component Value Range Status Comment   Specimen Description BLOOD HAND RIGHT   Final    Special Requests BOTTLES DRAWN AEROBIC ONLY 5CC   Final    Setup Time [id number]   Final    Culture     Final    Value:        BLOOD CULTURE RECEIVED NO GROWTH TO DATE CULTURE WILL BE HELD FOR 5 DAYS BEFORE ISSUING A FINAL NEGATIVE REPORT   Report Status PENDING   Incomplete   CULTURE, BLOOD (ROUTINE X 2)     Status: Normal (Preliminary result)   Collection Time   03/27/11 12:45 PM      Component Value Range Status Comment   Specimen Description BLOOD HAND RIGHT   Final    Special Requests BOTTLES DRAWN AEROBIC ONLY 2CC   Final    Setup Time [id number]   Final    Culture     Final    Value:        BLOOD CULTURE RECEIVED NO GROWTH TO DATE CULTURE WILL BE HELD FOR 5 DAYS BEFORE ISSUING A FINAL NEGATIVE REPORT   Report Status PENDING   Incomplete   MRSA PCR  SCREENING     Status: Normal   Collection Time   03/27/11  4:40 PM      Component Value Range Status Comment   MRSA by PCR NEGATIVE  NEGATIVE  Final     Medical History: Past Medical History  Diagnosis Date  . Hypertension   . Stroke     Assessment: 60 yo male admitted with cerebellar stroke. Patient is on D3 Unasyn for r/o aspiration PNA and possible UTI. SrCr has improved with estimated CrCl 70 ml/min.   Plan:  1. Change Unasyn to 3gm IV Q6H.  2. F/u culture results.  Emeline Gins 03/29/2011,10:03 AM

## 2011-03-29 NOTE — Progress Notes (Signed)
Speech Language/Pathology [phone]  Note regarding MBS  MBS completed. Full report to follow.  Pt with minimal oral and mild pharyngeal dysphagia without aspiration or penetration of any consistency tested.  Dysphagia appears to be characterized by mostly decreased UES opening/relaxation resulting in moderate pharyngeal stasis (pyriform more than vallecular) that pt generally sensed.  Suspect decreased intraoral pressure may contribute to pt's pharyngeal residuals as well, as he is dysarthric.  Retained secretions noted in pyriform sinus as well without pt awareness.  Reflexive dry swallows help to decrease residuals but did not fully eliminate it.  Pt reports dysphagia x1 month but unable to identify medical issue/change as source.    As pt is edentulous and is dysarthric, recommend pt initiate diet of Mechanical soft/ground meats with thin liquids with aspiration precautions.  Allow pt time to conduct 2-3 swallows with each bolus.  Reflux precautions.  Suspect component of chronic dysphagia given pt's report.  Also recommend medications be crushed if not contraindicated and given in puree or ice-cream and follow with water.      Suspect component of esophageal issues, as pt appeared with mild stasis of thin distally without sensation (after multiple reflexive dry swallows) that cleared with cued dry swallow.  Radiologist not present to confirm.   Dollene Cleveland, MS CCC-SLP [phone]

## 2011-03-29 NOTE — Progress Notes (Signed)
eLink Physician-Brief Progress Note Patient Name: Andre [name] DOB: [birth year] MRN: [id number]  Date of Service  03/29/2011   HPI/Events of Note   BP 180/100  eICU Interventions  Increase lopressor Use hydrallazine prn   Intervention Category Major Interventions: Change in mental status - evaluation and management Intermediate Interventions: Hypertension - evaluation and management  ALVA,RAKESH V. 03/29/2011, 5:17 PM

## 2011-03-30 ENCOUNTER — Inpatient Hospital Stay (HOSPITAL_COMMUNITY): Payer: Medicaid Other

## 2011-03-30 LAB — CBC
HCT: 40.2 % (ref 39.0–52.0)
Hemoglobin: 12.1 g/dL — ABNORMAL LOW (ref 13.0–17.0)
MCH: 27.9 pg (ref 26.0–34.0)
MCHC: 30.1 g/dL (ref 30.0–36.0)
MCV: 92.6 fL (ref 78.0–100.0)
RDW: 12.9 % (ref 11.5–15.5)

## 2011-03-30 LAB — BASIC METABOLIC PANEL
BUN: 18 mg/dL (ref 6–23)
Calcium: 9.5 mg/dL (ref 8.4–10.5)
Creatinine, Ser: 1.15 mg/dL (ref 0.50–1.35)
GFR calc Af Amer: 79 mL/min — ABNORMAL LOW (ref >90)
GFR calc non Af Amer: 68 mL/min — ABNORMAL LOW (ref >90)
Glucose, Bld: 80 mg/dL (ref 70–99)
Potassium: 3.9 mEq/L (ref 3.5–5.1)

## 2011-03-30 MED ORDER — PANTOPRAZOLE SODIUM 40 MG PO TBEC: 40.0000 mg | DELAYED_RELEASE_TABLET | Freq: Every day | ORAL | Status: DC

## 2011-03-30 MED ORDER — INFLUENZA VIRUS VACC SPLIT PF IM SUSP
0.5000 mL | INTRAMUSCULAR | Status: DC
  Administered 2011-03-31: 0.5 mL via INTRAMUSCULAR
  Filled 2011-03-30: qty 0.5

## 2011-03-30 MED ORDER — PNEUMOCOCCAL VAC POLYVALENT 25 MCG/0.5ML IJ INJ
0.5000 mL | INJECTION | INTRAMUSCULAR | Status: DC
  Administered 2011-03-31: 0.5 mL via INTRAMUSCULAR
  Filled 2011-03-30: qty 0.5

## 2011-03-30 MED ORDER — METOPROLOL TARTRATE 25 MG/10 ML ORAL SUSPENSION
50.0000 mg | Freq: Two times a day (BID) | ORAL | Status: DC
  Administered 2011-03-30 – 2011-04-03 (×9): 50 mg via ORAL
  Filled 2011-03-30 (×10): qty 20

## 2011-03-30 NOTE — Progress Notes (Signed)
*  PRELIMINARY RESULTS* TC dopplers performed.  Andre [name] 03/30/2011, 9:16 AM

## 2011-03-30 NOTE — Progress Notes (Signed)
Reason for Consult: Cerebellar Stroke and AMS  Referring Physician: Dr. Mare Loan  Brief history patient is a 60 year old man brought to the ER after was found unresponsive- found to have acute cerebellar stroke and became hypoxic while in ER.  Lines/tubes  Culture data/sepsis markers  11/22>> BCx x2>> ngtd>> 11/22 >> Urine culture>> not done Antibiotics  11/22>>Unasyn IV- ( poss Asp PNA )  Best practice  GI prophylaxis : Ppi  VTE prophylaxis: UFH  Protocols/consults  Neuro physician  Neurosurgery  Events/studies  Altered mental status- in ER-CT head-11/22- acute left cerebellar stroke.  Neuro physician and neurosurgery consult placed. 11/23- Etoh WD signs  I/O last 3 completed shifts: In: 3452.9 [I.V.:2852.5; IV Piggyback:600.4] Out: 2930 [Urine:2930]    Filed Vitals:   03/30/11 0700  BP: 157/110  Pulse: 83  Temp:   Resp: 27   General: Awake, alert, aggitated at times, wants water Neuro: nonfocal, not lethargic HEENT: jvd wnl PULM: ronchi rt  CV: s1 s2 RRR ZO:XWRU, Bs wnl , no r Extremities: no edema  CBC    Component Value Date/Time   WBC 6.7 03/30/2011 0500   RBC 4.34 03/30/2011 0500   HGB 12.1* 03/30/2011 0500   HCT 40.2 03/30/2011 0500   PLT 131* 03/30/2011 0500   MCV 92.6 03/30/2011 0500   MCH 27.9 03/30/2011 0500   MCHC 30.1 03/30/2011 0500   RDW 12.9 03/30/2011 0500   LYMPHSABS 2.0 03/29/2011 0500   MONOABS 0.8 03/29/2011 0500   EOSABS 0.1 03/29/2011 0500   BASOSABS 0.0 03/29/2011 0500    BMET    Component Value Date/Time   NA 142 03/30/2011 0500   K 3.9 03/30/2011 0500   CL 106 03/30/2011 0500   CO2 26 03/30/2011 0500   GLUCOSE 80 03/30/2011 0500   BUN 18 03/30/2011 0500   CREATININE 1.15 03/30/2011 0500   CALCIUM 9.5 03/30/2011 0500   GFRNONAA 68* 03/30/2011 0500   GFRAA 79* 03/30/2011 0500   ABG    Component Value Date/Time   PHART 7.422 03/29/2011 0437   PCO2ART 41.2 03/29/2011 0437   PO2ART 70.9* 03/29/2011 0437   HCO3 26.3*  03/29/2011 0437   TCO2 27.6 03/29/2011 0437   ACIDBASEDEF 4.0* 03/27/2011 1138   O2SAT 94.5 03/29/2011 0437   Dg Chest 2 View  03/30/2011  :. Some interval increase in left lower lobe patchy airspace opacities and a more nodular focus of consolidation.    Ct Head Wo Contrast  03/27/2011  *RADIOLOGY REPORT*  Clinical Data: Altered mental status.  CT HEAD WITHOUT CONTRAST  Technique:  Contiguous axial images were obtained from the base of the skull through the vertex without contrast.  Comparison: 10/28/2010  Findings: There is new low density involving the left cerebellar hemisphere suggesting edema.  No significant mass effect.  There may be a small lacune in the left basal ganglia and left thalamus. There is a subtle area of low density in the left white matter tract that may be old.  No evidence for acute hemorrhage, midline shift or hydrocephalus.  Paranasal sinuses are clear.  No acute bony abnormality.  IMPRESSION: New low density involving the left cerebellar hemisphere.  Findings are suggestive for edema and acute/subacute infarct.  Evidence for scattered lacunar injuries of unknown age.  These results were called by telephone on 03/27/2011  at  11:04 a.m. to  Dr.Jacubowitz, who verbally acknowledged these results.  Original Report Authenticated By: Richarda Overlie, M.D.    Assessment and Plan  1) Acute cerebellar nonhemorrhagic CVA:  Unknown etiology. Blood pressure  elevated. Might be from temporary severe hypertension.  No mass effect in fourth ventricle or hydrocephalus at present.  Plan:  - Stroke protocol and stroke team followup  - NS recs reviewed -ASA  -po lopressor for bp r ETOH WD, currently on CIWA, I think this pt will require MD driven treatment of WD, especially with CT findings cva? Thiamine, folic, mVI Lopressor iv q 6h start 11/24. Add catapress tts2  2) Altered mental status: Likely secondary to accommodation of #1 and #5-alcoholism.  Plan:  - As per #1 and 6   3)  Hypoxic respiratory distress: Patient desaturated to 83% earlier in ER. Venous blood gas was obtained which shows PO2 of 54.  Though maintaining his airways and has gag reflex but might have aspirated when he vomited in ER.  Plan:  No disstres Mild ATX  IS when able  4) ID: Possible aspiration pneumonia following acute cerebellar stroke and vomiting episode. Swallow eval ok for puree diet Leukocytosis- WBC 11.4  Also UTI as #5.  Plan:  Empiric asp cov Start po puree diet 11/25  5) Acute kidney injury :resolved 11/25  6) Substance abuse: Blood Alcohol level 149.  UDS positive for marijuana.  Plan:  - dc CIWA protocol...add MD driven ativan and consider breakthrough versed or dex if fail  - IV thiamine and folate until takes by mouth.   7) GI: Prophylaxis  Plan:  - IV Protonix.  - IV Zofran for nausea/vomiting   Remain in ICU needed till bp better controlled

## 2011-03-30 NOTE — Progress Notes (Signed)
SUBJECTIVE     SUBJECTIVE  [name] is a 60 y.o. maleHerbert P [name] is a 60 y.o. male who was brought in by EMS on 03-27-11 .  - found unresponsive at home after a night of drinking. He was lethargic and urine drug screen was positive for alcohol and cannabis. He is more alert today but denies any focal neurological symptoms. He states he had noticed that he was off balance for several days. Found to have a cerebellar stroke - Denied any slurred speech headache or extremity weakness.  He states he had a stroke earlier this year. He explained he was on antihypertensives at home , was told by his PCP these" were too strong" . He is calm and collected today, with wife and daughter at bed side.     OBJECTIVE Most recent Vital Signs: Temp: 98.9 F (37.2 C) (11/25 0400) Temp src: Oral (11/25 0400) BP: 156/102 mmHg (11/25 1300) Pulse Rate: 82  (11/25 1300) Respiratory Rate: 24 O2 Saturdation: 99%  CBG (last 3)  No results found for this basename: GLUCAP:3 in the last 72 hours Intake/Output from previous day: 11/24 0701 - 11/25 0700 In: 2002.9 [I.V.:1502.5; IV Piggyback:500.4] Out: 1655 [Urine:1655]  IV Fluid Intake:     . sodium chloride 75 mL/hr at 03/30/11 1200   Diet: Dysphagia  liquids  Activity: bedrest - can be up with assistance for meals.   DVT Prophylaxis:  lovenox   Studies: Results for orders placed during the hospital encounter of 03/27/11 (from the past 24 hour(s))  CBC     Status: Abnormal   Collection Time   03/30/11  5:00 AM      Component Value Range   WBC 6.7  4.0 - 10.5 (K/uL)   RBC 4.34  4.22 - 5.81 (MIL/uL)   Hemoglobin 12.1 (*) 13.0 - 17.0 (g/dL)   HCT 16.1  09.6 - 04.5 (%)   MCV 92.6  78.0 - 100.0 (fL)   MCH 27.9  26.0 - 34.0 (pg)   MCHC 30.1  30.0 - 36.0 (g/dL)   RDW 40.9  81.1 - 91.4 (%)   Platelets 131 (*) 150 - 400 (K/uL)  BASIC METABOLIC PANEL     Status: Abnormal   Collection Time   03/30/11  5:00 AM      Component Value  Range   Sodium 142  135 - 145 (mEq/L)   Potassium 3.9  3.5 - 5.1 (mEq/L)   Chloride 106  96 - 112 (mEq/L)   CO2 26  19 - 32 (mEq/L)   Glucose, Bld 80  70 - 99 (mg/dL)   BUN 18  6 - 23 (mg/dL)   Creatinine, Ser 7.82  0.50 - 1.35 (mg/dL)   Calcium 9.5  8.4 - 95.6 (mg/dL)   GFR calc non Af Amer 68 (*) >90 (mL/min)   GFR calc Af Amer 79 (*) >90 (mL/min)     Ct Head Wo Contrast  03/28/2011  *RADIOLOGY REPORT*  Clinical Data: Stroke.  CT HEAD WITHOUT CONTRAST  Technique:  Contiguous axial images were obtained from the base of the skull through the vertex without contrast.  Comparison: 03/27/2011 and 10/28/2010  Findings: Again noted is the nonhemorrhagic infarction in the left cerebellar hemisphere with slightly increased mass effect upon the fourth ventricle but the fourth ventricle is not occluded.  No hemorrhage.  The remainder of the brain is unchanged with no dilatation of the third or lateral ventricles.  Tiny old lacunar infarcts in the left  basal ganglia.  No atrophy.  No osseous abnormalities.  IMPRESSION: Slight increased mass effect upon the fourth ventricle by the nonhemorrhagic infarction in the left cerebellar hemisphere.  No ventricular obstruction.  No other change.  Original Report Authenticated By: Gwynn Burly, M.D.   US Renal  03/28/2011  *RADIOLOGY REPORT*  Clinical Data: Renal failure.  RENAL/URINARY TRACT ULTRASOUND COMPLETE  Comparison:  None.  Findings:  Right Kidney:  10.8 cm in length.  Normal.  Left Kidney:  Normal.  11.1 cm in length.  Bladder:  Empty.  Foley catheter in place.  IMPRESSION: Normal renal ultrasound.  Original Report Authenticated By: Gwynn Burly, M.D.   Dg Chest Port 1 View  03/30/2011  *RADIOLOGY REPORT*  Clinical Data: Aspiration  PORTABLE CHEST - 1 VIEW  Comparison:   the previous day's study  Findings: Coarse airspace opacities in the lung bases, left greater than right.  There is a   nodular 2.3 cm region in the left lower lobe, more  conspicuous than on prior exam.  No effusion.  Old left rib fractures.  Heart size upper limits normal.  No effusion.  IMPRESSION:  1.  Some interval increase in left lower lobe patchy airspace opacities and   a more nodular focus of consolidation.  Recommend follow-up to confirm appropriate resolution.  Original Report Authenticated By: Osa Craver, M.D.   Dg Chest Port 1 View  03/29/2011  *RADIOLOGY REPORT*  Clinical Data: Aspiration.  PORTABLE CHEST - 1 VIEW  Comparison: 03/27/2011.  Findings: Low volume chest.  Left basilar atelectasis appears improved compared to prior exam.  Cardiopericardial silhouette within normal limits. Monitoring leads are projected over the chest.  IMPRESSION: Low volume chest with improving left basilar atelectasis. Unchanged mild right basilar atelectasis.  Original Report Authenticated By: Andreas Newport, M.D.   Dg Swallowing Func-no Report  03/29/2011  CLINICAL DATA: dysphagia   FLUOROSCOPY FOR SWALLOWING FUNCTION STUDY:  Fluoroscopy was provided for swallowing function study, which was  administered by a speech pathologist.  Final results and recommendations  from this study are contained within the speech pathology report.      Physical Exam:  Still elevated blood pressure , but below the concerning syst. and diast. Levels. 168/87 mmHg     [name] currently not agitated , not in distress.  Head is nontraumatic, oral cavity reveals no tongue bite- patient is  without teeth.  Patient able to answer questions as to his PCP, at home medications.Knew he is at [facility] .  Cardiac exam regular heart sounds no murmur or gallop. Lungs are clear to auscultation. Blood pressure remains elevated on Clonidine patch and ativan Prophylaxis for DTs.  Neurological exam : drowsy follows commands delayed and asks questions repetitively. He has diminished attention, short-term memory and recall.  Eye movements are full range with coarse Horizontal  saccadic eye movements are coarse. He blinks to threat bilaterally. There is no lower facial weakness. He moves upper and lower extremities well against gravity but is not cooperative for details power testing.  Deep tendon reflexes 1+ symmetric plantars are downgoing.  Patient was unable to sit up straight unassisted and immediately resumed a reclined position. Truncal ataxic .    ASSESSMENT Andre [name] is a 60 y.o. male with a cerebellar , Left sided infarct  of undetermined age , secondary to HTN. On ASA for secondary stroke prevention. He is menatlly more oriented and alert. No sign of withdrawal from ETOH.  Hydration  for renal insufficiency . creatinine decreased gradually.   Stroke risk factors: HTN -   Hospital day # 3  TREATMENT/PLAN  DT prophylaxis has worked well,  ASA daily given -  On Monday ready for step down unit.   Porfirio Mylar Okley Magnussen M.D. [facility]  [facility] Pager: [phone] 03/30/2011 1:06 PM

## 2011-03-31 DIAGNOSIS — I6340 Cerebral infarction due to embolism of unspecified cerebral artery: Secondary | ICD-10-CM

## 2011-03-31 DIAGNOSIS — F1910 Other psychoactive substance abuse, uncomplicated: Secondary | ICD-10-CM | POA: Diagnosis present

## 2011-03-31 DIAGNOSIS — R4182 Altered mental status, unspecified: Secondary | ICD-10-CM

## 2011-03-31 DIAGNOSIS — I10 Essential (primary) hypertension: Secondary | ICD-10-CM

## 2011-03-31 LAB — CBC
MCH: 29.1 pg (ref 26.0–34.0)
MCV: 91.5 fL (ref 78.0–100.0)
Platelets: 153 10*3/uL (ref 150–400)
RDW: 13 % (ref 11.5–15.5)
WBC: 5.8 10*3/uL (ref 4.0–10.5)

## 2011-03-31 LAB — BASIC METABOLIC PANEL
CO2: 26 mEq/L (ref 19–32)
Calcium: 9.4 mg/dL (ref 8.4–10.5)
Creatinine, Ser: 1.18 mg/dL (ref 0.50–1.35)
GFR calc non Af Amer: 66 mL/min — ABNORMAL LOW (ref >90)
Glucose, Bld: 97 mg/dL (ref 70–99)

## 2011-03-31 NOTE — Progress Notes (Signed)
Stroke Team Progress Note  SUBJECTIVE [name] is a 60 y.o. male who  Has remained neurologically stable since admission 4 days ago. He had alcohol intoxication and was kept on alcohol withdrawal precautions and has done well. Plan is to transfer him to the floor today.no new neurological issues noted over the weekend OBJECTIVE Most recent Vital Signs: Temp: 98.9 F (37.2 C) (11/26 0400) Temp src: Oral (11/26 0400) BP: 141/89 mmHg (11/26 0800) Pulse Rate: 72  (11/26 0800) Respiratory Rate: 25 O2 Saturdation: 100%  CBG (last 3)  No results found for this basename: GLUCAP:3 in the last 72 hours Intake/Output from previous day: 11/25 0701 - 11/26 0700 In: 3173.8 [P.O.:1120; I.V.:1643.8; IV Piggyback:410] Out: 825 [Urine:825]  IV Fluid Intake:     . sodium chloride 75 mL/hr at 03/31/11 0700   Diet: Dysphagia    Activity: Up with assistance   DVT Prophylaxis:  lovenox Studies: Results for orders placed during the hospital encounter of 03/27/11 (from the past 24 hour(s))  BASIC METABOLIC PANEL     Status: Abnormal   Collection Time   03/31/11  5:00 AM      Component Value Range   Sodium 140  135 - 145 (mEq/L)   Potassium 3.8  3.5 - 5.1 (mEq/L)   Chloride 106  96 - 112 (mEq/L)   CO2 26  19 - 32 (mEq/L)   Glucose, Bld 97  70 - 99 (mg/dL)   BUN 18  6 - 23 (mg/dL)   Creatinine, Ser 9.56  0.50 - 1.35 (mg/dL)   Calcium 9.4  8.4 - 21.3 (mg/dL)   GFR calc non Af Amer 66 (*) >90 (mL/min)   GFR calc Af Amer 77 (*) >90 (mL/min)  CBC     Status: Abnormal   Collection Time   03/31/11  5:00 AM      Component Value Range   WBC 5.8  4.0 - 10.5 (K/uL)   RBC 4.13 (*) 4.22 - 5.81 (MIL/uL)   Hemoglobin 12.0 (*) 13.0 - 17.0 (g/dL)   HCT 08.6 (*) 57.8 - 52.0 (%)   MCV 91.5  78.0 - 100.0 (fL)   MCH 29.1  26.0 - 34.0 (pg)   MCHC 31.7  30.0 - 36.0 (g/dL)   RDW 46.9  62.9 - 52.8 (%)   Platelets 153  150 - 400 (K/uL)     Dg Chest Port 1 View  03/30/2011  *RADIOLOGY REPORT*   Clinical Data: Aspiration  PORTABLE CHEST - 1 VIEW  Comparison:   the previous day's study  Findings: Coarse airspace opacities in the lung bases, left greater than right.  There is a   nodular 2.3 cm region in the left lower lobe, more conspicuous than on prior exam.  No effusion.  Old left rib fractures.  Heart size upper limits normal.  No effusion.  IMPRESSION:  1.  Some interval increase in left lower lobe patchy airspace opacities and   a more nodular focus of consolidation.  Recommend follow-up to confirm appropriate resolution.  Original Report Authenticated By: Osa Craver, M.D.   Dg Swallowing Func-no Report  03/29/2011  CLINICAL DATA: dysphagia   FLUOROSCOPY FOR SWALLOWING FUNCTION STUDY:  Fluoroscopy was provided for swallowing function study, which was  administered by a speech pathologist.  Final results and recommendations  from this study are contained within the speech pathology report.    2DECHo normal EF 50-55 % no clot LIPIDs normal HbA1c 5.2 % Physical Exam:  [name] currently not agitated , not in distress.  Head is nontraumatic, oral cavity reveals no tongue bite- patient is without teeth.  Patient able to answer questions .Knew he is at [facility] .  Cardiac exam regular heart sounds no murmur or gallop. Lungs are clear to auscultation. Blood pressure remains elevated on Clonidine patch and ativan Prophylaxis for DTs.  Neurological exam :Awake follows commands delayed and asks questions repetitively. He has diminished attention, short-term memory and recall.  Eye movements are full range with coarse Horizontal saccadic eye movements are coarse. He blinks to threat bilaterally. There is no lower facial weakness. He moves upper and lower extremities well against gravity but is not cooperative for details power testing.  Deep tendon reflexes 1+ symmetric plantars are downgoing.  Patient was unable to sit up straight unassisted and immediately  resumed a reclined position. Truncal ataxic .   ASSESSMENT [name] is a 60 y.o. male with a  Left cerebellar age indeterminate infarct, on aspirin 81 mg orally every day for secondary stroke prevention.Stroke risk factors:  hypertension  Hospital day # 4  TREATMENT/PLAN  mobilize out of bed. Physical and occupational therapy consults. Transfer to medical floor.check carotid ultrasound and transcranial Doppler studies.    macular degeneration [facility] Pager: [phone] 03/31/2011 9:35 AM

## 2011-03-31 NOTE — Progress Notes (Signed)
Occupational Therapy Evaluation Patient Details Name: Andre [name] MRN: [id number] DOB: 1951/04/19 Today's Date: 03/31/2011 11:40-12:12  evII Problem List:  Patient Active Problem List  Diagnoses  . Stroke, hemorrhagic  . Alcohol withdrawal  . Substance abuse or dependence    Past Medical History:  Past Medical History  Diagnosis Date  . Hypertension   . Stroke    Past Surgical History:  Past Surgical History  Procedure Date  . Abdominal surgery     OT Assessment/Plan/Recommendation OT Assessment Clinical Impression Statement: Pleasant 60 year old male with history of new CVA.  Presents with decreased balance both static and dynamic in standing, decreased overall strength, decreased functional coordination, all resulting in decreased overall independence with ADL's.  Feel pt will benefit from acute OT services to address these deficits in order to return home with his girlfriend, who he states can provide almost 24 hour assist.  Feel patient will need follow-up home health OT if pt does have adequated supervision as home..  Feel he can reach supervison level for all selfcare tasks prior to d/c. OT Recommendation/Assessment: Patient will need skilled OT in the acute care venue OT Problem List: Decreased strength;Decreased activity tolerance;Impaired balance (sitting and/or standing);Impaired vision/perception;Decreased knowledge of use of DME or AE;Decreased coordination;Decreased safety awareness OT Therapy Diagnosis : Generalized weakness;Disturbance of vision OT Plan OT Frequency: Min 2X/week OT Treatment/Interventions: Self-care/ADL training;Therapeutic activities;Therapeutic exercise;Neuromuscular education;DME and/or AE instruction;Balance training;Patient/family education OT Recommendation Follow Up Recommendations: Home health OT Equipment Recommended: None recommended by OT Individuals Consulted Consulted and Agree with Results and Recommendations: Patient OT  Goals Acute Rehab OT Goals OT Goal Formulation: With patient Time For Goal Achievement: 2 weeks ADL Goals Pt Will Perform Grooming: with supervision;Standing at sink ADL Goal: Grooming - Progress: Progressing toward goals Pt Will Perform Lower Body Bathing: with supervision;Sit to stand from bed ADL Goal: Lower Body Bathing - Progress: Progressing toward goals Pt Will Perform Lower Body Dressing: with supervision;Sit to stand from bed ADL Goal: Lower Body Dressing - Progress: Progressing toward goals Pt Will Transfer to Toilet: with supervision;Comfort height toilet ADL Goal: Toilet Transfer - Progress: Progressing toward goals Pt Will Perform Toileting - Clothing Manipulation: with supervision;Sitting on 3-in-1 or toilet;Standing ADL Goal: Toileting - Clothing Manipulation - Progress: Progressing toward goals Pt Will Perform Toileting - Hygiene: with supervision;Sit to stand from 3-in-1/toilet ADL Goal: Toileting - Hygiene - Progress: Progressing toward goals Pt Will Perform Tub/Shower Transfer: with supervision;Shower seat with back ADL Goal: Web designer - Progress: Progressing toward goals  OT Evaluation Precautions/Restrictions  Precautions Precautions: Fall Required Braces or Orthoses: No Restrictions Weight Bearing Restrictions: No Prior Functioning Home Living Lives With: Significant other Receives Help From: Friend(s) Type of Home: Apartment Home Layout: One level Home Access: Stairs to enter Entrance Stairs-Rails: None Entrance Stairs-Number of Steps: 4-5 Bathroom Shower/Tub: Tub/shower unit;Door Bathroom Toilet: Handicapped height Bathroom Accessibility: Yes Home Adaptive Equipment: Straight cane;Shower chair with back Additional Comments: girlfriend reports pt uses cane at all times Prior Function Level of Independence: Independent with basic ADLs;Requires assistive device for independence ADL ADL Eating/Feeding: Simulated;Set up Where Assessed -  Eating/Feeding: Chair Grooming: Performed;Wash/dry hands;Maximal assistance Where Assessed - Grooming: Standing at sink Upper Body Bathing: Simulated;Supervision/safety Where Assessed - Upper Body Bathing: Unsupported;Sitting, bed Lower Body Bathing: Simulated;Minimal assistance Where Assessed - Lower Body Bathing: Sit to stand from bed Upper Body Dressing: Simulated;Set up Where Assessed - Upper Body Dressing: Supported;Sitting, bed Lower Body Dressing: Simulated;Minimal assistance Where Assessed - Lower Body Dressing:  Sit to stand from chair Toilet Transfer: Performed;Minimal assistance Toilet Transfer Method: Proofreader: Regular height toilet;Grab bars Toileting - Clothing Manipulation: Simulated;Minimal assistance Where Assessed - Toileting Clothing Manipulation: Sit to stand from 3-in-1 or toilet Toileting - Hygiene: Simulated;Minimal assistance Where Assessed - Toileting Hygiene: Sit to stand from 3-in-1 or toilet Tub/Shower Transfer: Not assessed Tub/Shower Transfer Method: Not assessed ADL Comments: Pt overall needs min assist to perform sit to stand and transfers without the use of an assisted device.  During stand pivot transfers pt exhibits very short step length.  He reports this is not his normal pattern. Vision/Perception  Vision - History Baseline Vision:  (Pt reports eyes watering frequently.  ) Patient Visual Report: No change from baseline;Other (comment) (Pt reports visual changes exiting before CVA.  Needs eye apt) Vision - Assessment Eye Alignment: Within Functional Limits Additional Comments: Pt demonstrates decreased ability to track objects in right superior visual field.  Noted jerky tracking throught. Perception Perception: Within Functional Limits Praxis Praxis: Intact Cognition Cognition Arousal/Alertness: Awake/alert Overall Cognitive Status: Difficult to assess (Pt inconsistent with responses about home  environment.) Orientation Level: Oriented to person;Oriented to place;Oriented to situation Sensation/Coordination Sensation Light Touch: Appears Intact Coordination Gross Motor Movements are Fluid and Coordinated: Yes Fine Motor Movements are Fluid and Coordinated: Yes Finger Nose Finger Test: Pt with slow finger to nose overall and left was slower when compared to right. Extremity Assessment RUE Assessment RUE Assessment: Within Functional Limits LUE Assessment LUE Assessment: Within Functional Limits Mobility  Bed Mobility Bed Mobility: Yes Rolling Right: 5: Supervision Right Sidelying to Sit: 5: Supervision;With rails Transfers Sit to Stand: 4: Min assist;From bed;With upper extremity assist Exercises   End of Session OT - End of Session Equipment Utilized During Treatment: Gait belt Activity Tolerance: Patient tolerated treatment well Patient left: in chair General Behavior During Session: [facility] for tasks performed Cognition: [facility] for tasks performed   Nayeliz Hipp OTR/L 03/31/2011, 1:37 PM  Pager number [phone]

## 2011-03-31 NOTE — Progress Notes (Signed)
Speech Language/Pathology Speech Pathology: Dysphagia Treatment Note  Patient was observed with : Mechanical Soft / Ground and Thin liquids.  Patient was noted to have s/s of aspiration : Yes:  Wet vocal quality and throat clear with large consecutive sips  Lung Sounds:  Clear, diminished  Temperature: temp   Patient required: Moderate verbal cues to take small single sips, otherwise, pt taking large consecutive sips with wet vocal quality and throat clearing.  The pt independently completes multiple swallows to clear oropharyngeal residuals.     Other: Pt was ordered to have a pureed diet, but was recommended by [facility]BS SLP to initiate a mechanical soft diet.  SLP observed with graham cracker, pt masticated and transited functionally, ok for upgrade to mechanical soft.   Recommendations:  Continue Dys3, thin diet, will need further training for aspiration precautions.   Pain:   none Intervention Required:   No   Goals: Goals Partially Met

## 2011-03-31 NOTE — Progress Notes (Signed)
Physical Therapy Treatment Patient Details Name: Andre [name] MRN: [id number] DOB: [birth year] Today's Date: 03/31/2011  PT Assessment/Plan  PT - Assessment/Plan Comments on Treatment Session: Pt's balance much improved compared to 11/23.  Unclear how much physical assist his girlfriend can provide on D/C, therefore may benefit from short course of inpt rehab on d/c PT Plan: Discharge plan remains appropriate PT Frequency: Min 4X/week Follow Up Recommendations: Inpatient Rehab Equipment Recommended: None recommended by PT PT Goals  Acute Rehab PT Goals PT Goal: Rolling Supine to Left Side - Progress: Progressing toward goal PT Goal: Supine/Side to Sit - Progress: Progressing toward goal PT Goal: Sit at Edge Of Bed - Progress: Progressing toward goal PT Goal: Sit to Supine/Side - Progress: Progressing toward goal PT Transfer Goal: Sit to Stand/Stand to Sit - Progress: Progressing toward goal PT Transfer Goal: Bed to Chair/Chair to Bed - Progress: Progressing toward goal PT Goal: Stand - Progress: Progressing toward goal PT Goal: Ambulate - Progress: Progressing toward goal  PT Treatment Precautions/Restrictions  Precautions Precautions: Fall Required Braces or Orthoses: No Restrictions Weight Bearing Restrictions: No Mobility (including Balance) Bed Mobility Bed Mobility:  (co-treatment with PT/OT) Rolling Right: 5: Supervision Right Sidelying to Sit: 5: Supervision;With rails Transfers Sit to Stand: 4: Min assist;From bed;With upper extremity assist Sit to Stand Details (indicate cue type and reason): in mid-line; slow to fully stand upright Stand to Sit: Other (comment);With upper extremity assist;To chair/3-in-1 Stand to Sit Details: min-guard A Ambulation/Gait Ambulation/Gait: Yes Ambulation/Gait Assistance: 4: Min assist Ambulation/Gait Assistance Details (indicate cue type and reason): second person for IV; pt midline, straight path; very slow cadence Ambulation  Distance (Feet): 10 Feet (10 ft, 5 ft, 50 ft) Assistive device: 1 person hand held assist (hand held on Rt) Gait Pattern: Decreased stride length;Decreased hip/knee flexion - right;Decreased hip/knee flexion - left;Antalgic;Lateral trunk lean to left Gait velocity: signficantly diminished--pt reports this is his baseline Stairs: No  Posture/Postural Control Posture/Postural Control: No significant limitations Static Sitting Balance Static Sitting - Balance Support: No upper extremity supported;Feet supported Static Sitting - Level of Assistance: 5: Stand by assistance Static Standing Balance Static Standing - Balance Support: No upper extremity supported Static Standing - Level of Assistance: 5: Stand by assistance Exercise    End of Session PT - End of Session Equipment Utilized During Treatment: Gait belt Activity Tolerance: Patient tolerated treatment well Patient left: in chair;with call bell in reach;Other (comment) (with OT) Nurse Communication: Mobility status for ambulation General Behavior During Session: [facility] for tasks performed Cognition: [facility] for tasks performed  Mireya Meditz 03/31/2011, 3:43 PM Pager [phone]  .

## 2011-03-31 NOTE — Progress Notes (Signed)
Reason for Consult: Cerebellar Stroke and AMS  Referring Physician: Dr. Ethelda Chick  Brief history patient is a 60 year old man brought to the ER after was found unresponsive- found to have acute cerebellar stroke and became hypoxic while in ER.  Lines/tubes: none Culture data/sepsis markers  11/22>> BCx x2>> NEG  Antibiotics  Unasyn 11/22 >> 11/26 Best practice  GI prophylaxis : not indicated VTE prophylaxis: UFH  Protocols/consults  Neuro physician  Neurosurgery  Events/studies  Altered mental status- in ER-CT head-11/22- acute left cerebellar stroke.  Neuro physician and neurosurgery consult placed. 11/23- Etoh WD signs  I/O last 3 completed shifts: In: 4223.8 [P.O.:1120; I.V.:2393.8; IV Piggyback:710] Out: 1675 [Urine:1675]    Filed Vitals:   03/31/11 0800  BP: 141/89  Pulse: 72  Temp:   Resp: 25   General: Awake, alert, Calm, oriented, c/o HA Neuro: nonfocal, not lethargic HEENT: jvd wnl PULM: clear s adventitious sounds CV: s1 s2 RRR ZO:XWRU, Bs wnl , no r Extremities: no edema  CBC    Component Value Date/Time   WBC 5.8 03/31/2011 0500   RBC 4.13* 03/31/2011 0500   HGB 12.0* 03/31/2011 0500   HCT 37.8* 03/31/2011 0500   PLT 153 03/31/2011 0500   MCV 91.5 03/31/2011 0500   MCH 29.1 03/31/2011 0500   MCHC 31.7 03/31/2011 0500   RDW 13.0 03/31/2011 0500   LYMPHSABS 2.0 03/29/2011 0500   MONOABS 0.8 03/29/2011 0500   EOSABS 0.1 03/29/2011 0500   BASOSABS 0.0 03/29/2011 0500    BMET    Component Value Date/Time   NA 140 03/31/2011 0500   K 3.8 03/31/2011 0500   CL 106 03/31/2011 0500   CO2 26 03/31/2011 0500   GLUCOSE 97 03/31/2011 0500   BUN 18 03/31/2011 0500   CREATININE 1.18 03/31/2011 0500   CALCIUM 9.4 03/31/2011 0500   GFRNONAA 66* 03/31/2011 0500   GFRAA 77* 03/31/2011 0500   No new CXR.    Ct Head Wo Contrast  03/27/2011  *RADIOLOGY REPORT*  Clinical Data: Altered mental status.  CT HEAD WITHOUT CONTRAST  Technique:  Contiguous  axial images were obtained from the base of the skull through the vertex without contrast.  Comparison: 10/28/2010  Findings: There is new low density involving the left cerebellar hemisphere suggesting edema.  No significant mass effect.  There may be a small lacune in the left basal ganglia and left thalamus. There is a subtle area of low density in the left white matter tract that may be old.  No evidence for acute hemorrhage, midline shift or hydrocephalus.  Paranasal sinuses are clear.  No acute bony abnormality.  IMPRESSION: New low density involving the left cerebellar hemisphere.  Findings are suggestive for edema and acute/subacute infarct.  Evidence for scattered lacunar injuries of unknown age.  These results were called by telephone on 03/27/2011  at  11:04 a.m. to  Dr.Jacubowitz, who verbally acknowledged these results.  Original Report Authenticated By: Richarda Overlie, M.D.    Assessment and Plan  1) Cerebellar nonhemorrhagic CVA: Age indeterminant per Stroke team. Likely hypertensive.   Plan:  BP control Stroke team continues to follow   2) Altered mental status: Resolving EtOH w/d. Much improved. OK for transfer to regular bed. No longer requires sitter. No longer requires PCCM services  3) Hypoxic respiratory distress: resolved. 4) ID: Little evidence of active bacterial infection. Will D/C abx  5) Acute kidney injury: resolved 11/25  6) Substance abuse: Blood Alcohol level 149.  UDS positive for marijuana.  W/D symptoms much improved. Change Ativan to PRN only   Stable for transfer to regular bed and to [facility] service. Discussed with Dr Isidoro Donning - transfer to team 4, Dr Manson Passey, MD;  PCCM service; Mobile [phone]

## 2011-04-01 DIAGNOSIS — I6330 Cerebral infarction due to thrombosis of unspecified cerebral artery: Secondary | ICD-10-CM

## 2011-04-01 NOTE — Progress Notes (Signed)
Physical Therapy Treatment Patient Details Name: Andre [name] MRN: [id number] DOB: October 19, 1950 Today's Date: 04/01/2011  PT Assessment/Plan  PT - Assessment/Plan Comments on Treatment Session: Pt able to stand ~30 secs without UE support while washing hands at sink with min guard (A).    PT Plan: Discharge plan remains appropriate PT Frequency: Min 4X/week Follow Up Recommendations: Inpatient Rehab Equipment Recommended: None recommended by PT PT Goals  Acute Rehab PT Goals PT Goal: Supine/Side to Sit - Progress: Progressing toward goal PT Transfer Goal: Sit to Stand/Stand to Sit - Progress: Progressing toward goal PT Goal: Ambulate - Progress: Progressing toward goal  PT Treatment Precautions/Restrictions  Precautions Precautions: Fall Required Braces or Orthoses: No Restrictions Weight Bearing Restrictions: No Mobility (including Balance) Bed Mobility Right Sidelying to Sit: 5: Supervision Right Sidelying to Sit Details (indicate cue type and reason): cues for use of UE's to increase ease of transition Transfers Sit to Stand: Other (comment);From bed;From chair/3-in-1;With upper extremity assist (min guard assist) Sit to Stand Details (indicate cue type and reason): cues for hand placement.  performed 6x's for instruction/strengthening/activity tolerance.  Wide BOS.   Stand to Sit: Other (comment);With upper extremity assist;To chair/3-in-1;To bed (min guard assistance) Stand to Sit Details: cues for UE placement to control descent.   Ambulation/Gait Ambulation/Gait Assistance: 4: Min assist Ambulation/Gait Assistance Details (indicate cue type and reason): assistance for balance, lateral wt shifting, safety.  pt with decreased swing phase RLE; c/o of fatigue/weakness in LLE; Pt reaching for hallway rail occassionally.   Ambulation Distance (Feet): 60 Feet Assistive device: 1 person hand held assist ((A) provided on Right side) Gait Pattern: Decreased stride  length;Decreased step length - right;Decreased hip/knee flexion - right;Decreased hip/knee flexion - left;Lateral trunk lean to left    Exercise    End of Session PT - End of Session Equipment Utilized During Treatment: Gait belt Activity Tolerance: Patient tolerated treatment well Patient left: in chair;with call bell in reach General Behavior During Session: [facility] for tasks performed Cognition: [facility] for tasks performed  Lara Mulch 04/01/2011, 3:18 PM [phone]

## 2011-04-01 NOTE — Progress Notes (Signed)
Stroke Team Progress Note  SUBJECTIVE [name] is a 60 y.o. male who Has remained neurologically stable since admission 5 days ago. He had alcohol intoxication and was kept on alcohol withdrawal precautions and has done well. His stroke was subacute and the timing of it is unknown.  OBJECTIVE Most recent Vital Signs: Temp: 98.9 F (37.2 C) (11/27 0524) Temp src: Oral (11/27 0524) BP: 134/83 mmHg (11/27 0524) Pulse Rate: 79  (11/27 0524) Respiratory Rate: 20 O2 Saturdation: 98%  CBG (last 3)  No results found for this basename: GLUCAP:3 in the last 72 hours Intake/Output from previous day: 11/26 0701 - 11/27 0700 In: 1140 [P.O.:840; I.V.:300] Out: 500 [Urine:500]  IV Fluid Intake:    Diet: Dysphagia 3 thin liquids  Activity: Up with assistance  DVT Prophylaxis:  Heparin 5000 units sq tid   Studies: No results found for this or any previous visit (from the past 24 hour(s)).   No results found.  Physical Exam:  [name] currently not agitated , not in distress.  Head is nontraumatic, oral cavity reveals no tongue bite- patient is without teeth.  Patient able to answer questions. Cardiac exam regular heart sounds no murmur or gallop. Lungs are clear to auscultation.  Neurological exam :Awake follows commands delayed and asks questions repetitively. He has diminished attention, short-term memory and recall.  Eye movements are full range with coarse Horizontal saccadic eye movements are coarse. He blinks to threat bilaterally. There is no lower facial weakness. He moves upper and lower extremities well against gravity but is not cooperative for details power testing.  Plantars are downgoing.  Patient was able to sit up straight unassisted today. Mild Truncal ataxia.   ASSESSMENT [name] is a 60 y.o. male with a Left cerebellar infarct age indeterminate infarct, on aspirin 81 mg orally every day for secondary stroke prevention. No  further work up needed. Needs rehab.  Stroke risk factors:  hypertension, previous stroke, etoh abuse  Hospital day # 5  TREATMENT/PLAN Inpatient rehab evaluating patient. Will sign off. Follow up with Dr. Pearlean Brownie in 2 months.  Andre [name], ANP-BC, GNP-BC [facility] Pager: [phone] 04/01/2011 12:44 PM  Dr. Delia Heady, Stroke Center Medical Director, has personally reviewed chart, pertinent data, examined the patient and developed the plan of care.

## 2011-04-01 NOTE — Progress Notes (Signed)
04/01/2011 Fransico Michael [facility] Case Management Note [phone]       CIR consult today with recommendations for  [facility] therapy. In to speak with patient to offer choices for home health agencies. Patient refused home health services. No further discharge needs identified. CM will continue to follow.

## 2011-04-01 NOTE — Progress Notes (Signed)
Subjective: Complaining of a headache  Objective: Vital signs in last 24 hours: Filed Vitals:   03/31/11 1800 03/31/11 2000 03/31/11 2120 04/01/11 0524  BP: 165/103 119/80 154/96 134/83  Pulse:  88 80 79  Temp:  98.7 F (37.1 C) 97.9 F (36.6 C) 98.9 F (37.2 C)  TempSrc:  Oral Oral Oral  Resp: 28 30 20 20   Height:      Weight:      SpO2:  100% 96% 98%    Intake/Output Summary (Last 24 hours) at 04/01/11 1036 Last data filed at 03/31/11 2057  Gross per 24 hour  Intake    555 ml  Output    500 ml  Net     55 ml    Weight change:   General: Alert, awake, oriented x3, in no acute distress. HEENT: No bruits, no goiter. Heart: Regular rate and rhythm, without murmurs, rubs, gallops. Lungs: Clear to auscultation bilaterally. Abdomen: Soft, nontender, nondistended, positive bowel sounds. Extremities: No clubbing cyanosis or edema with positive pedal pulses. Neuro: Grossly intact, nonfocal.    Lab Results: No results found for this or any previous visit (from the past 24 hour(s)).   Micro: Recent Results (from the past 240 hour(s))  CULTURE, BLOOD (ROUTINE X 2)     Status: Normal (Preliminary result)   Collection Time   03/27/11 12:40 PM      Component Value Range Status Comment   Specimen Description BLOOD HAND RIGHT   Final    Special Requests BOTTLES DRAWN AEROBIC ONLY 5CC   Final    Setup Time [id number]   Final    Culture     Final    Value:        BLOOD CULTURE RECEIVED NO GROWTH TO DATE CULTURE WILL BE HELD FOR 5 DAYS BEFORE ISSUING A FINAL NEGATIVE REPORT   Report Status PENDING   Incomplete   CULTURE, BLOOD (ROUTINE X 2)     Status: Normal (Preliminary result)   Collection Time   03/27/11 12:45 PM      Component Value Range Status Comment   Specimen Description BLOOD HAND RIGHT   Final    Special Requests BOTTLES DRAWN AEROBIC ONLY 2CC   Final    Setup Time [id number]   Final    Culture     Final    Value:        BLOOD CULTURE RECEIVED NO GROWTH TO  DATE CULTURE WILL BE HELD FOR 5 DAYS BEFORE ISSUING A FINAL NEGATIVE REPORT   Report Status PENDING   Incomplete   MRSA PCR SCREENING     Status: Normal   Collection Time   03/27/11  4:40 PM      Component Value Range Status Comment   MRSA by PCR NEGATIVE  NEGATIVE  Final     Studies/Results: No results found.  Medications:     . aspirin EC  325 mg Oral Daily  . cloNIDine  0.2 mg Transdermal Weekly  . folic acid  1 mg Oral Daily  . heparin subcutaneous  5,000 Units Subcutaneous Q8H  . metoprolol tartrate  50 mg Oral BID  . multivitamins ther. w/minerals  1 tablet Oral Daily  . thiamine  100 mg Oral Daily  . DISCONTD: influenza  inactive virus vaccine  0.5 mL Intramuscular Tomorrow-1000  . DISCONTD: pneumococcal 23 valent vaccine  0.5 mL Intramuscular Tomorrow-1000     Assessment:  1) Cerebellar nonhemorrhagic CVA: Age indeterminant per Stroke team. Likely hypertensive.  Plan:  BP control  Stroke team continues to follow also continue aspirin. Continue to monitor if that doesn't control blood pressure. Needs inpatient rehabilitation. 2) Altered mental status: Resolving EtOH w/d. Much improved.  . No longer requires sitter. No signs of withdrawal   3) Hypoxic respiratory distress: resolved.  4) ID: Little evidence of active bacterial infection. Will D/C abx  5) Acute kidney injury: resolved 11/25  6) Substance abuse: Blood Alcohol level 149.  UDS positive for marijuana.  W/D symptoms much improved. Change Ativan to PRN only     CIR when bed available  LOS: 5 days   [facility] 04/01/2011, 10:36 AM
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
## 2011-04-02 LAB — CULTURE, BLOOD (ROUTINE X 2): Culture  Setup Time: 201211222236

## 2011-04-02 MED ORDER — SENNA 8.6 MG PO TABS
2.0000 | ORAL_TABLET | Freq: Every day | ORAL | Status: DC
  Administered 2011-04-02: 17.2 mg via ORAL
  Filled 2011-04-02 (×3): qty 2

## 2011-04-02 MED ORDER — PROCHLORPERAZINE 25 MG RE SUPP
25.0000 mg | Freq: Once | RECTAL | Status: DC
  Filled 2011-04-02: qty 1

## 2011-04-02 NOTE — Consult Note (Signed)
Pt smokes 2 cigarettes per day and plans to quit on his own. Encouraged and advised pt to quit. Referred to 1-800 quit now for f/u and support. Discussed oral fixation substitutes, second hand smoke and in home smoking policy. Reviewed and gave pt Written education/contact information.

## 2011-04-02 NOTE — Progress Notes (Signed)
Occupational Therapy Evaluation Patient Details Name: Andre [name] MRN: [id number] DOB: [birth year] Today's Date: 04/02/2011 15:31-1554  2sc OT Assessment/Plan OT Assessment/Plan OT Plan: Discharge plan remains appropriate Follow Up Recommendations: Home health OT (Eval for safety.) Equipment Recommended: None recommended by OT OT Goals ADL Goals ADL Goal: Grooming - Progress: Met ADL Goal: Statistician - Progress: Met ADL Goal: Toileting - Clothing Manipulation - Progress: Met ADL Goal: Toileting - Hygiene - Progress: Met ADL Goal: Web designer - Progress: Met  OT Treatment Precautions/Restrictions  Precautions Precautions: Fall Required Braces or Orthoses: No Restrictions Weight Bearing Restrictions: No   ADL ADL Grooming: Performed;Wash/dry hands;Supervision/safety Where Assessed - Grooming: Standing at sink Toilet Transfer: Research scientist (life sciences) Method: Proofreader: Comfort height toilet;Grab bars Toileting - Clothing Manipulation: Performed;Supervision/safety Where Assessed - Glass blower/designer Manipulation: Sit on 3-in-1 or toilet Toileting - Hygiene: Performed;Supervision/safety Where Assessed - Toileting Hygiene: Sit to stand from 3-in-1 or toilet Tub/Shower Transfer: Performed;Supervision/safety Tub/Shower Transfer Method: Ambulating Equipment Used: Cane ADL Comments: Pt overall supervision level for selfcare tasks practiced today.  Still needs increased time to perform mobility and also demonstrates short step length. Mobility  Bed Mobility Supine to Sit: 5: Supervision Transfers Sit to Stand: 5: Supervision;From bed;With upper extremity assist Exercises    End of Session OT - End of Session Equipment Utilized During Treatment: Gait belt;Other (comment) (Used single point cane.) Activity Tolerance: Patient tolerated treatment well Patient left: in chair;with call bell in  reach General Behavior During Session: [facility] for tasks performed Cognition: [facility] for tasks performed  Peniel Biel OTR/L 04/02/2011, 4:13 PM Pager number [phone]

## 2011-04-02 NOTE — Progress Notes (Signed)
Speech Pathology: Dysphagia Treatment Note  Patient was observed with: Soft and Regular solids with Thin liquids.  Patient was noted to have s/s of aspiratio : No  Lung Sounds: clear Temperature: afebrile  Clinical Impression:  Demonstrates a functional oral-pharyngeal swallow with Dys. 3 textures as well as with regular solids in today's trial.  No interventions needed in this observation.  Recommendations:  1.  Upgrade textures to Regular solids 2.  Cognitive-Linguistic evaluation via Home Health (may have baseline memory deficits) 3.  D/C from acute care services  Pain:   none Intervention Required:  No  Goals: All Goals Met  Myra Rude, M.S.,CCC-SLP Pager [phone]

## 2011-04-02 NOTE — Progress Notes (Signed)
Evaluated for possible admission.  Please see rehab consult done by Dr. Riley Kill 11/27 recommending home health services.  Agree with need for home health services.  Please call for questions.  Pager [phone]

## 2011-04-02 NOTE — Progress Notes (Signed)
Subjective: Constipation. Patient has not had a BM in 3-4 days. Passing flatus. No nausea vomiting or abdominal pain. He says his left sided weakness is improving. He ambulated with the help of physical therapy.  Objective: Blood pressure 134/84, pulse 71, temperature 98.5 F (36.9 C), temperature source Oral, resp. rate 18, height 5\' 11"  (1.803 m), weight 90.1 kg (198 lb 10.2 oz), SpO2 96.00%.  Intake/Output Summary (Last 24 hours) at 04/02/11 1721 Last data filed at 04/02/11 1200  Gross per 24 hour  Intake    360 ml  Output   1200 ml  Net   -840 ml   General exam: Patient is sitting comfortably in the chair in no obvious distress. Respiratory system: Clear. Cardio vascular system: S1 and S2 heard, regular. No JVD. Gastrointestinal system: Abdomen is nondistended, soft and normal bowel sounds heard. Central nervous system: Alert and oriented. No cranial nerve deficits. Extremities: Grade 4+ by 5 power in the left extremities. Right side is 5 x 5 power.  Lab Results: Basic Metabolic Panel:  Basename 03/31/11 0500  NA 140  K 3.8  CL 106  CO2 26  GLUCOSE 97  BUN 18  CREATININE 1.18  CALCIUM 9.4  MG --  PHOS --   Liver Function Tests: No results found for this basename: AST:2,ALT:2,ALKPHOS:2,BILITOT:2,PROT:2,ALBUMIN:2 in the last 72 hours No results found for this basename: LIPASE:2,AMYLASE:2 in the last 72 hours No results found for this basename: AMMONIA:2 in the last 72 hours CBC:  Basename 03/31/11 0500  WBC 5.8  NEUTROABS --  HGB 12.0*  HCT 37.8*  MCV 91.5  PLT 153   Cardiac Enzymes: No results found for this basename: CKTOTAL:3,CKMB:3,CKMBINDEX:3,TROPONINI:3 in the last 72 hours BNP: No results found for this basename: POCBNP:3 in the last 72 hours D-Dimer: No results found for this basename: DDIMER:2 in the last 72 hours CBG: No results found for this basename: GLUCAP:6 in the last 72 hours Hemoglobin A1C: No results found for this basename: HGBA1C in  the last 72 hours Fasting Lipid Panel: No results found for this basename: CHOL,HDL,LDLCALC,TRIG,CHOLHDL,LDLDIRECT in the last 72 hours Thyroid Function Tests: No results found for this basename: TSH,T4TOTAL,FREET4,T3FREE,THYROIDAB in the last 72 hours Anemia Panel: No results found for this basename: VITAMINB12,FOLATE,FERRITIN,TIBC,IRON,RETICCTPCT in the last 72 hours Coagulation: No results found for this basename: LABPROT:2,INR:2 in the last 72 hours Urine Drug Screen: Drugs of Abuse     Component Value Date/Time   LABOPIA NONE DETECTED 03/27/2011 1149   COCAINSCRNUR NONE DETECTED 03/27/2011 1149   LABBENZ NONE DETECTED 03/27/2011 1149   AMPHETMU NONE DETECTED 03/27/2011 1149   THCU POSITIVE* 03/27/2011 1149   LABBARB NONE DETECTED 03/27/2011 1149    Alcohol Level: No results found for this basename: ETH:2 in the last 72 hours Urinalysis:  Misc. Labs:   Micro Results: Recent Results (from the past 240 hour(s))  CULTURE, BLOOD (ROUTINE X 2)     Status: Normal   Collection Time   03/27/11 12:40 PM      Component Value Range Status Comment   Specimen Description BLOOD HAND RIGHT   Final    Special Requests BOTTLES DRAWN AEROBIC ONLY 5CC   Final    Setup Time [id number]   Final    Culture NO GROWTH 5 DAYS   Final    Report Status 04/02/2011 FINAL   Final   CULTURE, BLOOD (ROUTINE X 2)     Status: Normal   Collection Time   03/27/11 12:45 PM  Component Value Range Status Comment   Specimen Description BLOOD HAND RIGHT   Final    Special Requests BOTTLES DRAWN AEROBIC ONLY [facility]   Final    Setup Time [id number]   Final    Culture NO GROWTH 5 DAYS   Final    Report Status 04/02/2011 FINAL   Final   MRSA PCR SCREENING     Status: Normal   Collection Time   03/27/11  4:40 PM      Component Value Range Status Comment   MRSA by PCR NEGATIVE  NEGATIVE  Final     Studies/Results: Dg Chest 2 View  03/27/2011  *RADIOLOGY REPORT*  Clinical Data: Altered mental  status.  Chest pain and shortness of breath.  CHEST - 2 VIEW  Comparison: 11/15/2010  Findings: Low lung volumes are seen with mild bibasilar atelectasis.  No evidence of pulmonary air space disease or pleural effusion.  Heart size is prominent but likely due to patient positioning and low lung volumes.  IMPRESSION: Low lung volumes with mild bibasilar atelectasis.  Original Report Authenticated By: Danae Orleans, M.D.   Ct Head Wo Contrast  03/28/2011  *RADIOLOGY REPORT*  Clinical Data: Stroke.  CT HEAD WITHOUT CONTRAST  Technique:  Contiguous axial images were obtained from the base of the skull through the vertex without contrast.  Comparison: 03/27/2011 and 10/28/2010  Findings: Again noted is the nonhemorrhagic infarction in the left cerebellar hemisphere with slightly increased mass effect upon the fourth ventricle but the fourth ventricle is not occluded.  No hemorrhage.  The remainder of the brain is unchanged with no dilatation of the third or lateral ventricles.  Tiny old lacunar infarcts in the left basal ganglia.  No atrophy.  No osseous abnormalities.  IMPRESSION: Slight increased mass effect upon the fourth ventricle by the nonhemorrhagic infarction in the left cerebellar hemisphere.  No ventricular obstruction.  No other change.  Original Report Authenticated By: Gwynn Burly, M.D.   Ct Head Wo Contrast  03/27/2011  *RADIOLOGY REPORT*  Clinical Data: Altered mental status.  CT HEAD WITHOUT CONTRAST  Technique:  Contiguous axial images were obtained from the base of the skull through the vertex without contrast.  Comparison: 10/28/2010  Findings: There is new low density involving the left cerebellar hemisphere suggesting edema.  No significant mass effect.  There may be a small lacune in the left basal ganglia and left thalamus. There is a subtle area of low density in the left white matter tract that may be old.  No evidence for acute hemorrhage, midline shift or hydrocephalus.  Paranasal  sinuses are clear.  No acute bony abnormality.  IMPRESSION: New low density involving the left cerebellar hemisphere.  Findings are suggestive for edema and acute/subacute infarct.  Evidence for scattered lacunar injuries of unknown age.  These results were called by telephone on 03/27/2011  at  11:04 a.m. to  Dr.Jacubowitz, who verbally acknowledged these results.  Original Report Authenticated By: Richarda Overlie, M.D.   US Renal  03/28/2011  *RADIOLOGY REPORT*  Clinical Data: Renal failure.  RENAL/URINARY TRACT ULTRASOUND COMPLETE  Comparison:  None.  Findings:  Right Kidney:  10.8 cm in length.  Normal.  Left Kidney:  Normal.  11.1 cm in length.  Bladder:  Empty.  Foley catheter in place.  IMPRESSION: Normal renal ultrasound.  Original Report Authenticated By: Gwynn Burly, M.D.   Dg Chest Port 1 View  03/30/2011  *RADIOLOGY REPORT*  Clinical Data: Aspiration  PORTABLE CHEST - 1  VIEW  Comparison:   the previous day's study  Findings: Coarse airspace opacities in the lung bases, left greater than right.  There is a   nodular 2.3 cm region in the left lower lobe, more conspicuous than on prior exam.  No effusion.  Old left rib fractures.  Heart size upper limits normal.  No effusion.  IMPRESSION:  1.  Some interval increase in left lower lobe patchy airspace opacities and   a more nodular focus of consolidation.  Recommend follow-up to confirm appropriate resolution.  Original Report Authenticated By: Osa Craver, M.D.   Dg Chest Port 1 View  03/29/2011  *RADIOLOGY REPORT*  Clinical Data: Aspiration.  PORTABLE CHEST - 1 VIEW  Comparison: 03/27/2011.  Findings: Low volume chest.  Left basilar atelectasis appears improved compared to prior exam.  Cardiopericardial silhouette within normal limits. Monitoring leads are projected over the chest.  IMPRESSION: Low volume chest with improving left basilar atelectasis. Unchanged mild right basilar atelectasis.  Original Report Authenticated By: Andreas Newport, M.D.   Dg Swallowing Func-no Report  03/29/2011  CLINICAL DATA: dysphagia   FLUOROSCOPY FOR SWALLOWING FUNCTION STUDY:  Fluoroscopy was provided for swallowing function study, which was  administered by a speech pathologist.  Final results and recommendations  from this study are contained within the speech pathology report.      Medications: Scheduled Meds:   . aspirin EC  325 mg Oral Daily  . cloNIDine  0.2 mg Transdermal Weekly  . folic acid  1 mg Oral Daily  . heparin subcutaneous  5,000 Units Subcutaneous Q8H  . metoprolol tartrate  50 mg Oral BID  . multivitamins ther. w/minerals  1 tablet Oral Daily  . thiamine  100 mg Oral Daily   Continuous Infusions:  PRN Meds:.hydrALAZINE, metoprolol, ondansetron (ZOFRAN) IV  Assessment/Plan: 1. Left cerebellar infarct of indeterminate age: Continue aspirin for secondary stroke prevention. Rehabilitation and he determined that patient did not require inpatient rehabilitation and is suitable for home health services. 2. Altered Mental Status: Resolved. 3. Alcohol dependence: No further withdrawal symptoms or signs. 4. Substance abuse/marijuana: Cessation counseling done. 5. Acute renal failure: Resolved 6. Constipation: Laxatives.  Disposition: Patient indicates that he lives with a girlfriend who will be able to care for him from tomorrow. Discharged tomorrow to home with home health services.      Andre [name] 04/02/2011, 5:21 PM

## 2011-04-03 MED ORDER — FOLIC ACID 1 MG PO TABS: 1.0000 mg | ORAL_TABLET | Freq: Every day | ORAL | Status: AC

## 2011-04-03 MED ORDER — METOPROLOL TARTRATE 50 MG PO TABS: 50.0000 mg | ORAL_TABLET | Freq: Two times a day (BID) | ORAL | Status: DC

## 2011-04-03 MED ORDER — ASPIRIN 325 MG PO TBEC: 325.0000 mg | DELAYED_RELEASE_TABLET | Freq: Every day | ORAL | Status: DC

## 2011-04-03 MED ORDER — THERA M PLUS PO TABS: 1.0000 | ORAL_TABLET | Freq: Every day | ORAL | Status: DC

## 2011-04-03 MED ORDER — AMLODIPINE BESYLATE 10 MG PO TABS: 10.0000 mg | ORAL_TABLET | Freq: Every day | ORAL | Status: DC

## 2011-04-03 MED ORDER — THIAMINE HCL 100 MG PO TABS: 100.0000 mg | ORAL_TABLET | Freq: Every day | ORAL | Status: AC

## 2011-04-03 MED ORDER — ASPIRIN EC 81 MG PO TBEC: 81.0000 mg | DELAYED_RELEASE_TABLET | Freq: Every day | ORAL | Status: AC

## 2011-04-03 NOTE — Progress Notes (Signed)
Clinical Social Worker received phone call from RN stating that pt requesting assistance with transportation. Clinical Social Worker spoke with pt who stated that there were no friends or family with transportation to transport him home at D/C. Clinical Social Worker provided pt bus pass. Pt appreciative of Clinical Social Work assistance. No further social work needs at this time.  Jacklynn Lewis, MSW, LCSWA  Clinical Social Work [phone]

## 2011-04-03 NOTE — Progress Notes (Signed)
Discharge plan updated to HHPT secondary to pt progression. Goals revised and stair goal added. Agree with the following.   04/03/2011 Milana Kidney DPT PAGER: [phone] OFFICE: [phone]

## 2011-04-03 NOTE — Discharge Summary (Signed)
Discharge Summary  Andre [name] MR#: [id number] DOB:05-02-1951  Date of Admission: 03/27/2011 Date of Discharge: 04/03/2011  Patient's PCP: Dr. Clyda Greener.  Attending Physician:Mak Bonny  Consults: 1. Inpatient rehabilitation M.D. 2. Neurology: Carmell Austria and Marlowe Shores, M.D's 3. Neurosurgery: Donalee Citrin, M.D.   Discharge Diagnoses: 1. Left cerebellar infarct age indeterminate infarct 2. Hypertension 3. Alcohol dependence 4. Polysubstance abuse/marijuana 5. Altered mental status, resolved 6. Acute renal failure, resolved   Brief Admitting History and Physical Patient is a 59 year old African American male patient with prior history of his stroke, hypertension, alcohol dependence and polysubstance abuse who was brought to the emergency room after being found unresponsive sitting in his chair by his brother-in-law. CT scan of the head showed cerebellar stroke. Patient was hypoxic and unresponsive. CCM was consulted. His temperature was 99.61F, pulse 91 per minute, blood pressure 140/84 mmHg, respiration 33 per minute and saturating at 93% on room air. His physical exam was significant for spontaneous movement of all extremities. Slurred incomprehensible speech. He was not following simple commands. He intermittently open eyes to verbal stimulus. He was evaluated to have acute cerebellar nonhemorrhagic CVA, altered mental status and hypoxemia. He was admitted by the critical care service initially and once stabilized was transferred to the hospitalist service.  Discharge Medications Current Discharge Medication List    START taking these medications   Details  aspirin EC 325 MG EC tablet Take 1 tablet (325 mg total) by mouth daily. Qty: 30 tablet, Refills: 0    folic acid (FOLVITE) 1 MG tablet Take 1 tablet (1 mg total) by mouth daily. Qty: 30 tablet, Refills: 0    Multiple Vitamins-Minerals (MULTIVITAMINS THER. W/MINERALS) TABS Take 1 tablet by mouth daily. Qty: 30  each, Refills: 0    thiamine 100 MG tablet Take 1 tablet (100 mg total) by mouth daily. Qty: 30 tablet, Refills: 0      CONTINUE these medications which have CHANGED   Details  amLODipine (NORVASC) 10 MG tablet Take 1 tablet (10 mg total) by mouth daily. Qty: 30 tablet, Refills: 0    metoprolol (LOPRESSOR) 50 MG tablet Take 1 tablet (50 mg total) by mouth 2 (two) times daily. Qty: 60 tablet, Refills: 0      STOP taking these medications     hydrochlorothiazide (HYDRODIURIL) 25 MG tablet Comments:  Reason for Stopping:          Hospital Course: 1. Subacute left cerebellar infarct exact timing undetermined: Patient was admitted to the hospital. Neurology was consulted. Extensive workup was done. Neurology recommends continuing aspirin for secondary stroke prevention and no further workup. 2. Hypertension: Reasonably controlled. Patient has been counseled regarding compliance with medications. 3. Alcohol dependence: Patient was placed on multivitamins and alcohol withdrawal protocol. He has no further signs or symptoms of alcohol withdrawal. Again abstinence has been counseled. 4. Substance abuse/marijuana: Abstinence has been counseled. 5. Acute renal failure: Resolved  6. Presumed aspiration pneumonia from altered mental status: Patient has completed course of antibiotics and stable 7. Hypoxemia: Secondary to aspiration pneumonia. Resolved 8. Altered mental status secondary to stroke and alcohol withdrawal. Resolved   Day of Discharge BP 129/75  Pulse 74  Temp(Src) 98 F (36.7 C) (Oral)  Resp 22  Ht 5\' 11"  (1.803 m)  Wt 90.1 kg (198 lb 10.2 oz)  BMI 27.70 kg/m2  SpO2 97%  General exam: Patient is comfortable and ambulating in the room with the help of a cane. Respiratory system: Clear Cardiovascular system: First and second heart  sounds heard, regular Gastrointestinal system: Abdomen is nondistended, soft and normal bowel sounds heard. Central nervous system: Alert and  oriented. No cranial nerve deficits. Question left extremities 4+ by 5 power.   Laboratory Data: 1. BMP on November 26 was unremarkable. BUN 18, creatinine 1.18. 2. CBC 26th of November showed hemoglobin of 12 and hematocrit of 38. 3. LFTs 24th of November only significant for AST of 161 and ALT of 83 Dg Chest 2 View  03/27/2011  *RADIOLOGY REPORT*  Clinical Data: Altered mental status.  Chest pain and shortness of breath.  CHEST - 2 VIEW  Comparison: 11/15/2010  Findings: Low lung volumes are seen with mild bibasilar atelectasis.  No evidence of pulmonary air space disease or pleural effusion.  Heart size is prominent but likely due to patient positioning and low lung volumes.  IMPRESSION: Low lung volumes with mild bibasilar atelectasis.  Original Report Authenticated By: Danae Orleans, M.D.   Ct Head Wo Contrast  03/28/2011  *RADIOLOGY REPORT*  Clinical Data: Stroke.  CT HEAD WITHOUT CONTRAST  Technique:  Contiguous axial images were obtained from the base of the skull through the vertex without contrast.  Comparison: 03/27/2011 and 10/28/2010  Findings: Again noted is the nonhemorrhagic infarction in the left cerebellar hemisphere with slightly increased mass effect upon the fourth ventricle but the fourth ventricle is not occluded.  No hemorrhage.  The remainder of the brain is unchanged with no dilatation of the third or lateral ventricles.  Tiny old lacunar infarcts in the left basal ganglia.  No atrophy.  No osseous abnormalities.  IMPRESSION: Slight increased mass effect upon the fourth ventricle by the nonhemorrhagic infarction in the left cerebellar hemisphere.  No ventricular obstruction.  No other change.  Original Report Authenticated By: Gwynn Burly, M.D.   Ct Head Wo Contrast  03/27/2011  *RADIOLOGY REPORT*  Clinical Data: Altered mental status.  CT HEAD WITHOUT CONTRAST  Technique:  Contiguous axial images were obtained from the base of the skull through the vertex without  contrast.  Comparison: 10/28/2010  Findings: There is new low density involving the left cerebellar hemisphere suggesting edema.  No significant mass effect.  There may be a small lacune in the left basal ganglia and left thalamus. There is a subtle area of low density in the left white matter tract that may be old.  No evidence for acute hemorrhage, midline shift or hydrocephalus.  Paranasal sinuses are clear.  No acute bony abnormality.  IMPRESSION: New low density involving the left cerebellar hemisphere.  Findings are suggestive for edema and acute/subacute infarct.  Evidence for scattered lacunar injuries of unknown age.  These results were called by telephone on 03/27/2011  at  11:04 a.m. to  Dr.Jacubowitz, who verbally acknowledged these results.  Original Report Authenticated By: Richarda Overlie, M.D.   US Renal  03/28/2011  *RADIOLOGY REPORT*  Clinical Data: Renal failure.  RENAL/URINARY TRACT ULTRASOUND COMPLETE  Comparison:  None.  Findings:  Right Kidney:  10.8 cm in length.  Normal.  Left Kidney:  Normal.  11.1 cm in length.  Bladder:  Empty.  Foley catheter in place.  IMPRESSION: Normal renal ultrasound.  Original Report Authenticated By: Gwynn Burly, M.D.   Dg Chest Port 1 View  03/30/2011  *RADIOLOGY REPORT*  Clinical Data: Aspiration  PORTABLE CHEST - 1 VIEW  Comparison:   the previous day's study  Findings: Coarse airspace opacities in the lung bases, left greater than right.  There is a   nodular 2.3  cm region in the left lower lobe, more conspicuous than on prior exam.  No effusion.  Old left rib fractures.  Heart size upper limits normal.  No effusion.  IMPRESSION:  1.  Some interval increase in left lower lobe patchy airspace opacities and   a more nodular focus of consolidation.  Recommend follow-up to confirm appropriate resolution.  Original Report Authenticated By: Osa Craver, M.D.   Dg Chest Port 1 View  03/29/2011  *RADIOLOGY REPORT*  Clinical Data: Aspiration.   PORTABLE CHEST - 1 VIEW  Comparison: 03/27/2011.  Findings: Low volume chest.  Left basilar atelectasis appears improved compared to prior exam.  Cardiopericardial silhouette within normal limits. Monitoring leads are projected over the chest.  IMPRESSION: Low volume chest with improving left basilar atelectasis. Unchanged mild right basilar atelectasis.  Original Report Authenticated By: Andreas Newport, M.D.   Dg Swallowing Func-no Report  03/29/2011  CLINICAL DATA: dysphagia   FLUOROSCOPY FOR SWALLOWING FUNCTION STUDY:  Fluoroscopy was provided for swallowing function study, which was  administered by a speech pathologist.  Final results and recommendations  from this study are contained within the speech pathology report.       Disposition: Patient is discharged home in stable condition  Diet: Low sodium heart healthy diet Activity: Increase activity gradually  Follow-up Appts: Discharge Orders    Future Orders Please Complete By Expires   Diet - low sodium heart healthy      Increase activity slowly      No wound care         TESTS THAT NEED FOLLOW-UP Chest x-ray in a couple of weeks to evaluate resolution of left lower lobe nodular focus of consolidation.  Time spent on discharge, talking to the patient, and coordinating care: 40 minutes.   SignedMarcellus Scott, MD 04/03/2011, 3:31 PM

## 2011-04-03 NOTE — Progress Notes (Signed)
Physical Therapy Treatment Patient Details Name: Andre [name] MRN: [id number] DOB: 08-Oct-1950 Today's Date: 04/03/2011  PT Assessment/Plan  PT - Assessment/Plan Comments on Treatment Session: Inpt rehab recommending HHPT.  Pt would benefit from HHPT to increae strength, independence with mobility, safety, balance.  D/C plans updated PT Plan: Discharge plan needs to be updated PT Frequency: Min 4X/week Follow Up Recommendations: Home health PT Equipment Recommended: None recommended by PT PT Goals  Acute Rehab PT Goals PT Goal: Supine/Side to Sit - Progress: Met PT Goal: Sit to Supine/Side - Progress: Met Pt will Transfer Sit to Stand/Stand to Sit: with modified independence;with upper extremity assist PT Transfer Goal: Sit to Stand/Stand to Sit - Progress: Revised (modified due to lack of progress/goal met) Pt will Ambulate: >150 feet;with modified independence;with least restrictive assistive device PT Goal: Ambulate - Progress: Revised (modified due to lack of progress/goal met) Pt will Go Up / Down Stairs: 3-5 stairs;with modified independence;with rail(s);with least restrictive assistive device PT Goal: Up/Down Stairs - Progress: Progressing toward goal  PT Treatment Precautions/Restrictions  Precautions Precautions: Fall Required Braces or Orthoses: No Restrictions Weight Bearing Restrictions: No Mobility (including Balance) Bed Mobility Supine to Sit: 7: Independent Sit to Supine - Right: 7: Independent Transfers Sit to Stand: 6: Modified independent (Device/Increase time);From bed;With upper extremity assist Stand to Sit: 5: Supervision Stand to Sit Details: cues for body positioning before sitting & use of UE's to control descent/increase safety Ambulation/Gait Ambulation/Gait Assistance: 5: Supervision Ambulation/Gait Assistance Details (indicate cue type and reason): Pt ambulated with [facility]- pt initially placing cane in Left hand per his choice but then switched  over to placing it in right hand after instructional cueing.  Pt performed various gait challenges such as horizontal/vertical head turns. retropulsion, directional changes.  Gait deviations noted with head turns - decreased gait speed/complete stop with head turns Ambulation Distance (Feet): 150 Feet Assistive device: Straight cane Gait Pattern: Decreased step length - right;Decreased step length - left;Decreased stride length;Decreased hip/knee flexion - right;Decreased hip/knee flexion - left Stairs: Yes Stairs Assistance: 4: Min assist Stairs Assistance Details (indicate cue type and reason): assistance for balance & safety.  Pt with decreased step clearance.  Max cues for sequencing & technique.   Stair Management Technique: Step to pattern;Forwards;One rail Left;With cane Number of Stairs: 10     Exercise    End of Session PT - End of Session Equipment Utilized During Treatment: Gait belt Activity Tolerance: Patient tolerated treatment well Patient left: in bed;with call bell in reach;with family/visitor present General Behavior During Session: [facility] for tasks performed Cognition: [facility] for tasks performed  Lara Mulch 04/03/2011, 3:54 PM [phone]

## 2011-06-09 ENCOUNTER — Ambulatory Visit
Admission: RE | Admit: 2011-06-09 | Discharge: 2011-06-09 | Disposition: A | Discharge status: Home / Self Care | Payer: Medicaid Other | Source: Ambulatory Visit | Attending: Family Medicine | Admitting: Family Medicine

## 2011-06-09 ENCOUNTER — Other Ambulatory Visit: Payer: Self-pay | Admitting: Family Medicine

## 2011-06-09 DIAGNOSIS — I10 Essential (primary) hypertension: Secondary | ICD-10-CM

## 2011-07-29 ENCOUNTER — Ambulatory Visit
Admission: RE | Admit: 2011-07-29 | Discharge: 2011-07-29 | Disposition: A | Discharge status: Home / Self Care | Payer: Medicaid Other | Source: Ambulatory Visit | Attending: Family Medicine | Admitting: Family Medicine

## 2011-07-29 ENCOUNTER — Other Ambulatory Visit: Payer: Self-pay | Admitting: Family Medicine

## 2011-07-29 DIAGNOSIS — R062 Wheezing: Secondary | ICD-10-CM

## 2011-10-23 ENCOUNTER — Ambulatory Visit: Payer: Medicaid Other | Admitting: *Deleted

## 2011-10-27 ENCOUNTER — Ambulatory Visit: Payer: Medicaid Other | Admitting: Physical Therapy

## 2011-11-04 ENCOUNTER — Ambulatory Visit: Payer: Medicaid Other | Admitting: Physical Therapy

## 2011-11-18 ENCOUNTER — Ambulatory Visit: Payer: Medicaid Other | Attending: Neurology | Admitting: Physical Therapy

## 2011-11-18 DIAGNOSIS — R269 Unspecified abnormalities of gait and mobility: Secondary | ICD-10-CM | POA: Insufficient documentation

## 2011-11-18 DIAGNOSIS — M6281 Muscle weakness (generalized): Secondary | ICD-10-CM | POA: Insufficient documentation

## 2011-11-18 DIAGNOSIS — IMO0001 Reserved for inherently not codable concepts without codable children: Secondary | ICD-10-CM | POA: Insufficient documentation

## 2011-11-24 ENCOUNTER — Ambulatory Visit: Payer: Medicaid Other | Admitting: Physical Therapy

## 2011-12-03 ENCOUNTER — Ambulatory Visit: Payer: Medicaid Other | Admitting: Physical Therapy

## 2011-12-08 ENCOUNTER — Ambulatory Visit: Payer: Medicaid Other | Admitting: Physical Therapy

## 2011-12-12 ENCOUNTER — Ambulatory Visit: Payer: Medicaid Other | Attending: Neurology | Admitting: Physical Therapy

## 2011-12-12 DIAGNOSIS — R269 Unspecified abnormalities of gait and mobility: Secondary | ICD-10-CM | POA: Insufficient documentation

## 2011-12-12 DIAGNOSIS — M6281 Muscle weakness (generalized): Secondary | ICD-10-CM | POA: Insufficient documentation

## 2011-12-12 DIAGNOSIS — IMO0001 Reserved for inherently not codable concepts without codable children: Secondary | ICD-10-CM | POA: Insufficient documentation

## 2011-12-15 ENCOUNTER — Encounter: Payer: Medicaid Other | Admitting: Occupational Therapy

## 2012-01-09 IMAGING — CR DG RIBS W/ CHEST 3+V*L*
3 series · 3 of 3 positions shown · non-contrast
Comparison: Left rib x-rays with PA chest 11/09/2010.

CLINICAL DATA: None left rib fractures, with acute increase in pain
today after bending.

LEFT RIBS AND CHEST - 3+ VIEW 11/15/2010:

[w chest pa]
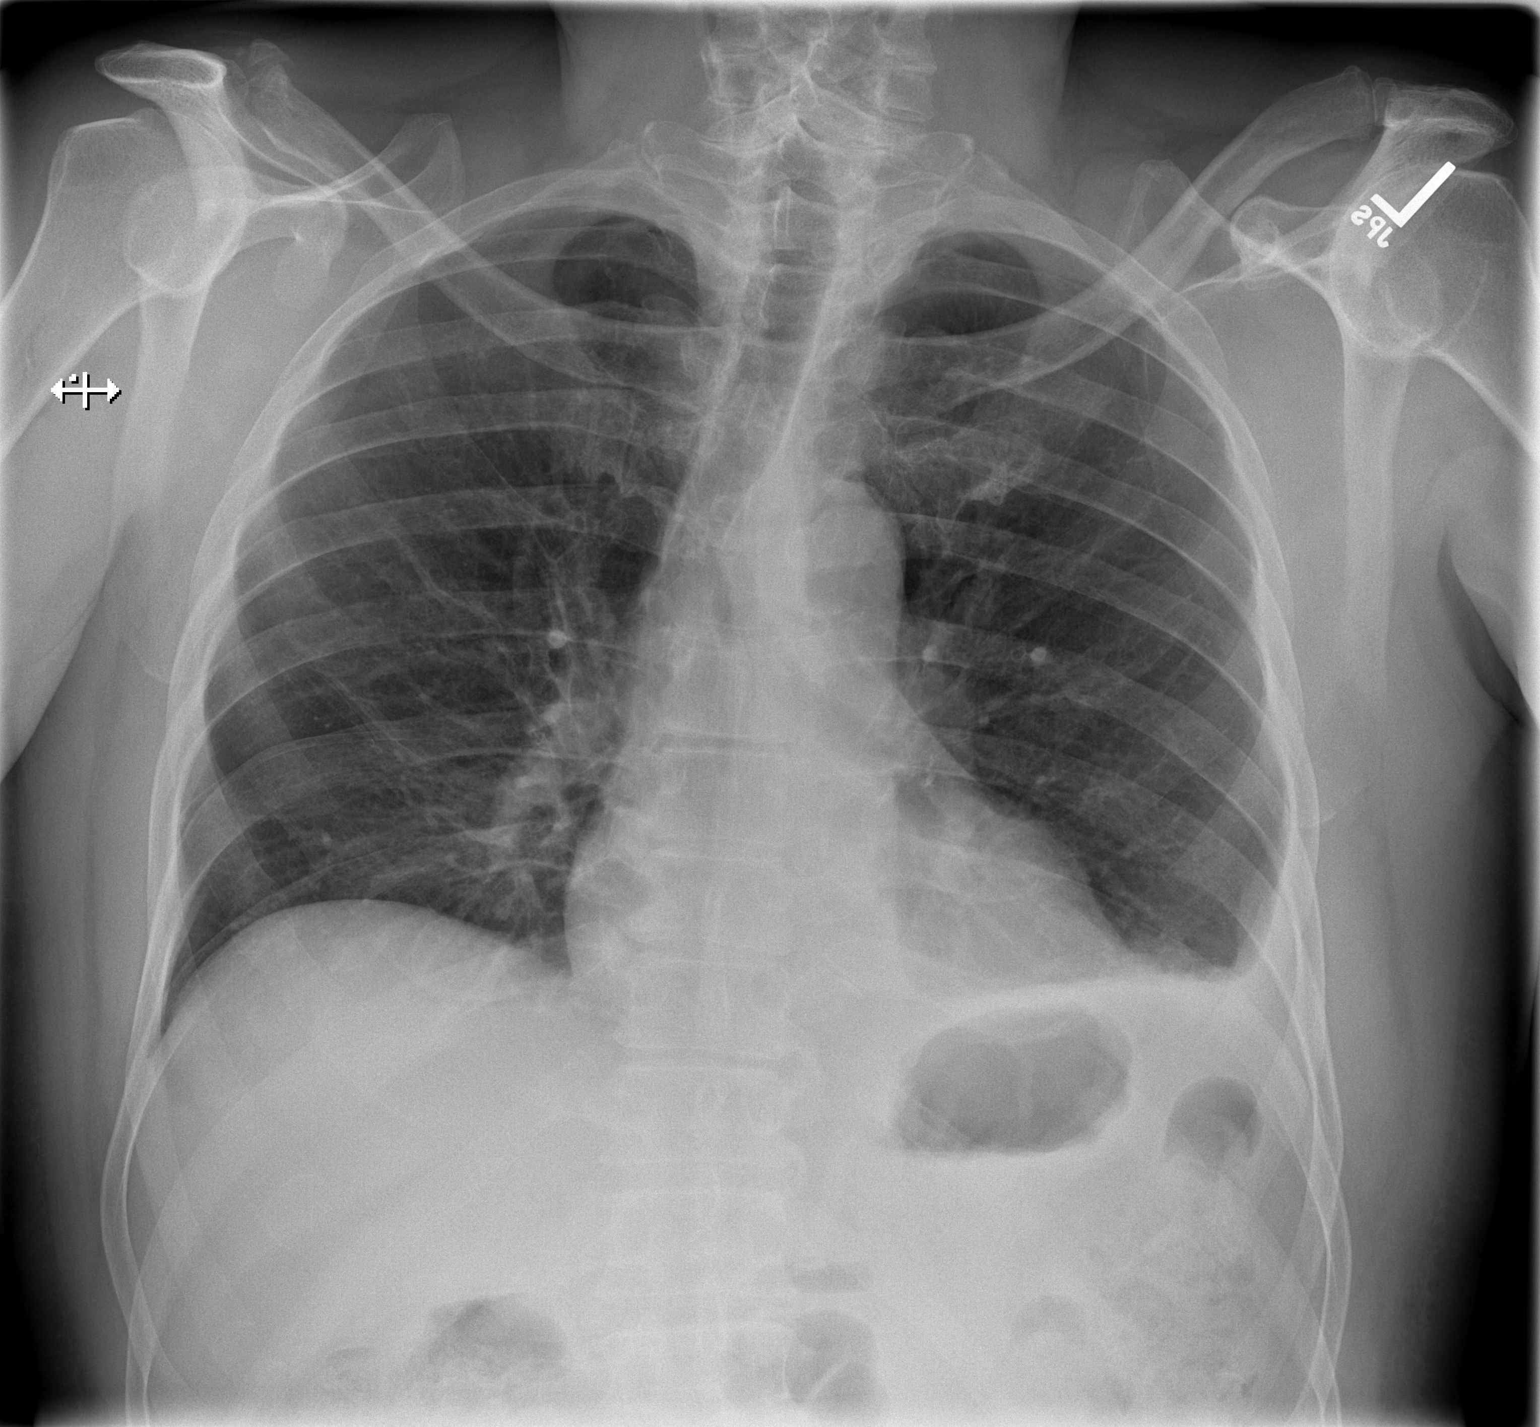

[w ribs ap lower left]
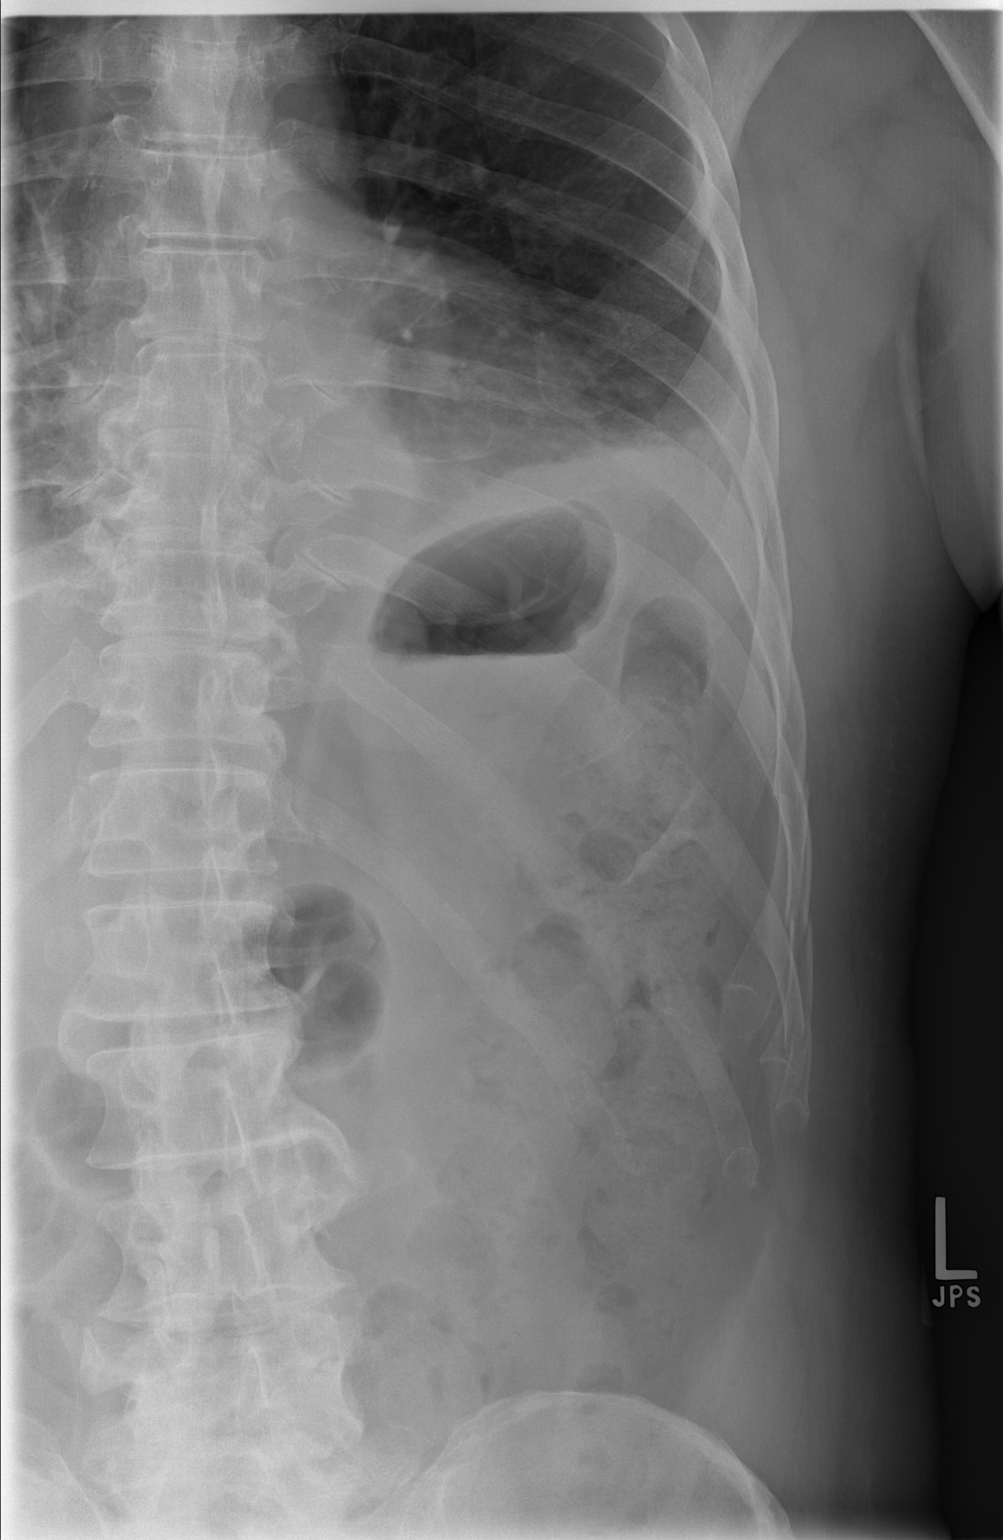

[w ribs obl left]
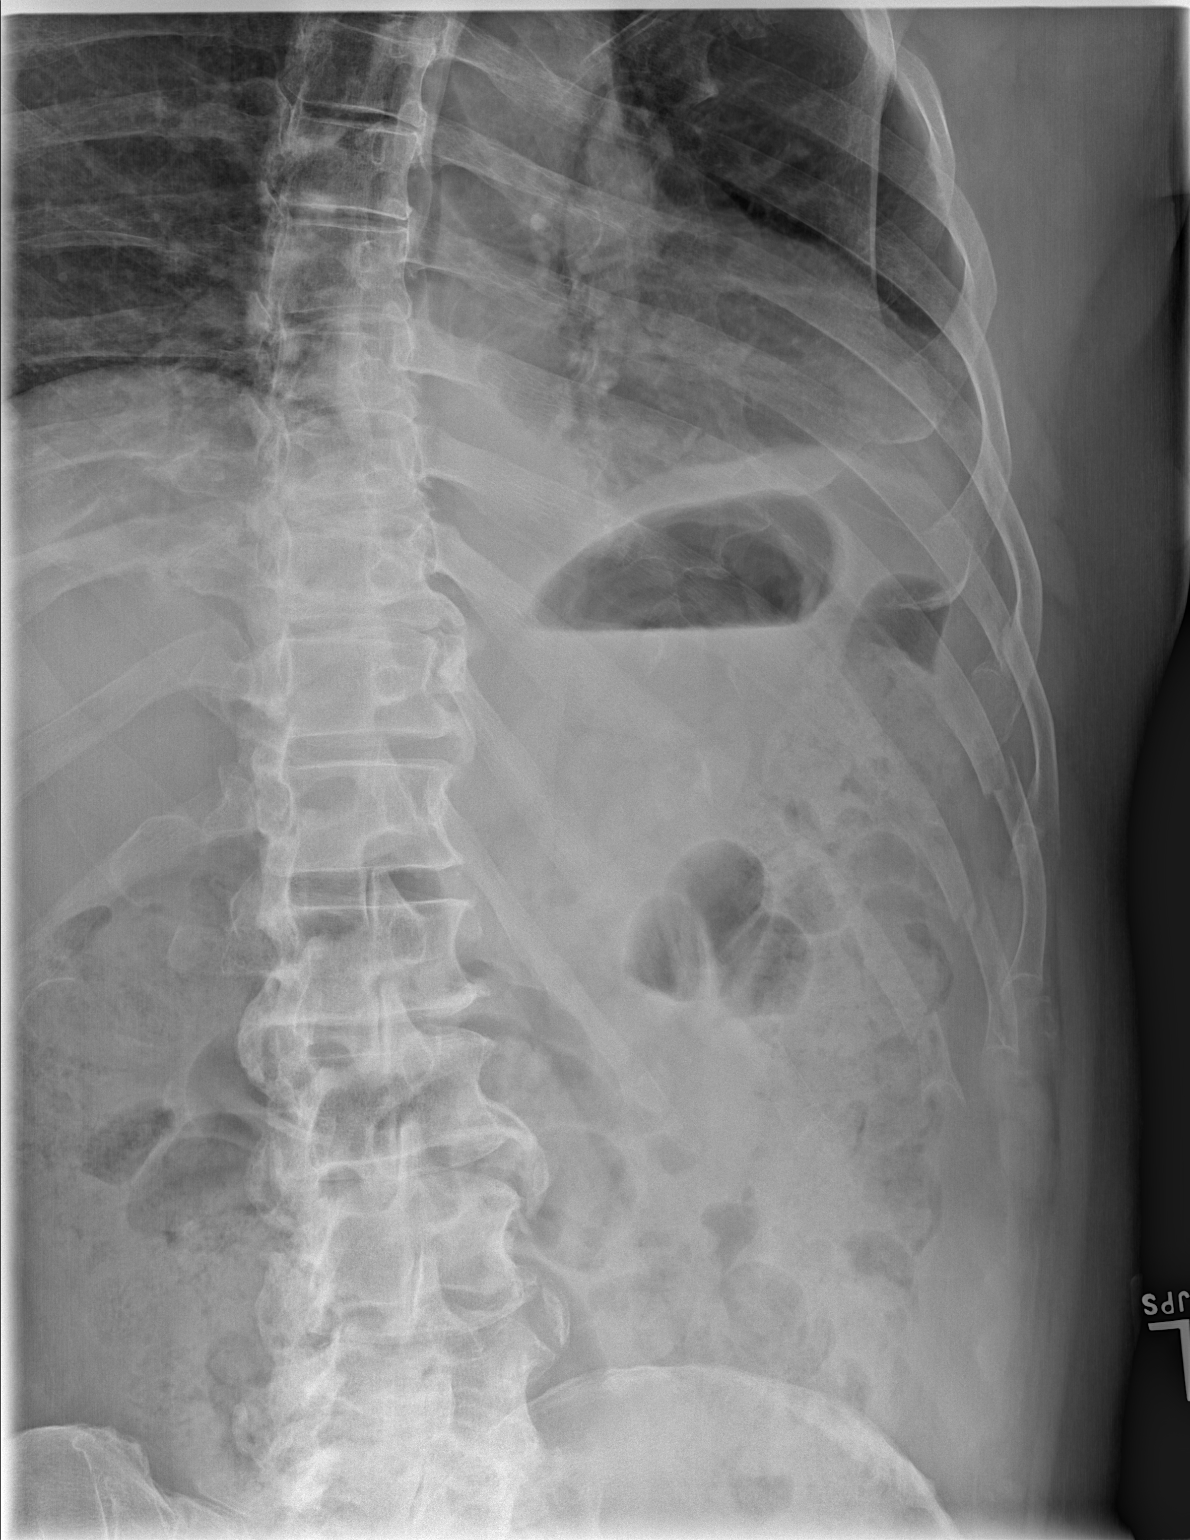

[3 of 3 positions shown; findings below may reference images not displayed]

FINDINGS: No significant change in the mildly displaced fractures
involving the lateral 7th, 8th, 9th, and 10th ribs.  No new
fractures.  Interval development of a small left pleural effusion
and associated mild atelectasis at the left lung base.  Lungs
remain clear otherwise.  Cardiomediastinal silhouette unremarkable
and unchanged.
IMPRESSION: Stable mildly displaced fractures involving the lateral left 7th
through 10th ribs.  No new fractures.  New small left pleural
effusion and associated mild atelectasis in the left lower lobe.

## 2012-05-16 ENCOUNTER — Emergency Department (HOSPITAL_COMMUNITY): Payer: Medicaid Other

## 2012-05-16 ENCOUNTER — Encounter (HOSPITAL_COMMUNITY): Payer: Self-pay | Admitting: *Deleted

## 2012-05-16 ENCOUNTER — Emergency Department (HOSPITAL_COMMUNITY)
Admission: EM | Admit: 2012-05-16 | Discharge: 2012-05-16 | Disposition: A | Discharge status: Home / Self Care | Payer: Medicaid Other | Attending: Emergency Medicine | Admitting: Emergency Medicine

## 2012-05-16 DIAGNOSIS — R05 Cough: Secondary | ICD-10-CM

## 2012-05-16 DIAGNOSIS — I10 Essential (primary) hypertension: Secondary | ICD-10-CM | POA: Insufficient documentation

## 2012-05-16 DIAGNOSIS — B9789 Other viral agents as the cause of diseases classified elsewhere: Secondary | ICD-10-CM | POA: Insufficient documentation

## 2012-05-16 DIAGNOSIS — Z8673 Personal history of transient ischemic attack (TIA), and cerebral infarction without residual deficits: Secondary | ICD-10-CM | POA: Insufficient documentation

## 2012-05-16 DIAGNOSIS — B349 Viral infection, unspecified: Secondary | ICD-10-CM

## 2012-05-16 DIAGNOSIS — R059 Cough, unspecified: Secondary | ICD-10-CM | POA: Insufficient documentation

## 2012-05-16 DIAGNOSIS — F17200 Nicotine dependence, unspecified, uncomplicated: Secondary | ICD-10-CM | POA: Insufficient documentation

## 2012-05-16 DIAGNOSIS — Z79899 Other long term (current) drug therapy: Secondary | ICD-10-CM | POA: Insufficient documentation

## 2012-05-16 LAB — POCT I-STAT, CHEM 8
BUN: 11 mg/dL (ref 6–23)
Calcium, Ion: 1.21 mmol/L (ref 1.12–1.23)
Chloride: 102 mEq/L (ref 96–112)
Creatinine, Ser: 1.1 mg/dL (ref 0.50–1.35)
Glucose, Bld: 94 mg/dL (ref 70–99)
HCT: 49 % (ref 39.0–52.0)
Hemoglobin: 16.7 g/dL (ref 13.0–17.0)
Potassium: 4.2 mEq/L (ref 3.5–5.1)
Sodium: 139 mEq/L (ref 135–145)
TCO2: 26 mmol/L (ref 0–100)

## 2012-05-16 MED ORDER — IOHEXOL 350 MG/ML SOLN
100.0000 mL | Freq: Once | INTRAVENOUS | Status: AC | PRN
  Administered 2012-05-16: 100 mL via INTRAVENOUS

## 2012-05-16 MED ORDER — ACETAMINOPHEN 500 MG PO TABS
1000.0000 mg | ORAL_TABLET | Freq: Once | ORAL | Status: AC
  Administered 2012-05-16: 1000 mg via ORAL
  Filled 2012-05-16: qty 2

## 2012-05-16 MED ORDER — ALBUTEROL SULFATE (5 MG/ML) 0.5% IN NEBU
5.0000 mg | INHALATION_SOLUTION | Freq: Once | RESPIRATORY_TRACT | Status: AC
  Administered 2012-05-16: 5 mg via RESPIRATORY_TRACT
  Filled 2012-05-16: qty 1

## 2012-05-16 MED ORDER — IBUPROFEN 800 MG PO TABS
800.0000 mg | ORAL_TABLET | Freq: Once | ORAL | Status: AC
  Administered 2012-05-16: 800 mg via ORAL
  Filled 2012-05-16: qty 1

## 2012-05-16 MED ORDER — SODIUM CHLORIDE 0.9 % IV BOLUS (SEPSIS)
1000.0000 mL | Freq: Once | INTRAVENOUS | Status: AC
  Administered 2012-05-16: 1000 mL via INTRAVENOUS

## 2012-05-16 MED ORDER — GUAIFENESIN-CODEINE 100-10 MG/5ML PO SOLN
10.0000 mL | Freq: Once | ORAL | Status: AC
  Administered 2012-05-16: 10 mL via ORAL
  Filled 2012-05-16: qty 10

## 2012-05-16 MED ORDER — GUAIFENESIN-CODEINE 100-10 MG/5ML PO SYRP: 5.0000 mL | ORAL_SOLUTION | Freq: Three times a day (TID) | ORAL | Status: DC | PRN

## 2012-05-16 NOTE — ED Provider Notes (Signed)
History    62 year old male with cough and shortness of breath since last night. Pain is sharp in nature and only has when coughing. Cough is productive for whitish sputum. No hemoptysis. His pain does not radiate. Mild shortness of breath. Patient is a smoker. No unusual leg pain or swelling. Denies history of blood clot. No recent surgeries or prolonged immobilization. No nausea or vomiting. No diarrhea. No urinary complaints. Mild diffuse headache. No neck stiffness. No visual complaints. Denies any diagnosed history of asthma or COPD.     CSN: [id number]  Arrival date & time 05/16/12  1429   First MD Initiated Contact with Patient 05/16/12 1552      Chief Complaint  Patient presents with  . Shortness of Breath  . Cough    (Consider location/radiation/quality/duration/timing/severity/associated sxs/prior treatment) HPI  Past Medical History  Diagnosis Date  . Hypertension   . Stroke     Past Surgical History  Procedure Date  . Abdominal surgery     History reviewed. No pertinent family history.  History  Substance Use Topics  . Smoking status: Current Some Day Smoker  . Smokeless tobacco: Not on file  . Alcohol Use: Yes      Review of Systems  All systems reviewed and negative, other than as noted in HPI.   Allergies  Review of patient's allergies indicates no known allergies.  Home Medications   Current Outpatient Rx  Name  Route  Sig  Dispense  Refill  . AMLODIPINE BESYLATE 10 MG PO TABS   Oral   Take 1 tablet (10 mg total) by mouth daily.   30 tablet   0   . METOPROLOL TARTRATE 50 MG PO TABS   Oral   Take 1 tablet (50 mg total) by mouth 2 (two) times daily.   60 tablet   0   . THERA M PLUS PO TABS   Oral   Take 1 tablet by mouth daily.   30 each   0     BP 150/83  Pulse 110  Temp 100.5 F (38.1 C) (Oral)  Resp 20  SpO2 96%  Physical Exam  Nursing note and vitals reviewed. Constitutional: He appears well-developed and  well-nourished. No distress.  HENT:  Head: Normocephalic and atraumatic.  Eyes: Conjunctivae normal are normal. Pupils are equal, round, and reactive to light. Right eye exhibits no discharge. Left eye exhibits no discharge.  Neck: Neck supple.  Cardiovascular: Regular rhythm and normal heart sounds.  Exam reveals no gallop and no friction rub.   No murmur heard.      tachycardia with a regular rhythm.  Pulmonary/Chest: Effort normal and breath sounds normal. No respiratory distress. He exhibits no tenderness.       Mild tachypnea. No accessory muscle usage. Lung sounds are clear and symmetric bilaterally.  Abdominal: Soft. He exhibits no distension. There is no tenderness.  Musculoskeletal: He exhibits no edema and no tenderness.       Lower extremities symmetric as compared to each other. No calf tenderness. Negative Homan's. No palpable cords.   Neurological: He is alert.  Skin: Skin is warm and dry.  Psychiatric: He has a normal mood and affect. His behavior is normal. Thought content normal.    ED Course  Procedures (including critical care time)  Labs Reviewed - No data to display Dg Chest 2 View  05/16/2012  *RADIOLOGY REPORT*  Clinical Data: Shortness of breath and cough.  CHEST - 2 VIEW  Comparison: Chest x-ray 07/29/2011.  Findings: Lung volumes are normal.  No consolidative airspace disease.  No pleural effusions.  No pneumothorax.  No pulmonary nodule or mass noted.  Pulmonary vasculature and the cardiomediastinal silhouette are within normal limits. Atherosclerosis in the thoracic aorta.  IMPRESSION: 1. No radiographic evidence of acute cardiopulmonary disease. 2.  Atherosclerosis.   Original Report Authenticated By: Trudie Reed, M.D.    Ct Angio Chest W/cm &/or Wo Cm  05/16/2012  *RADIOLOGY REPORT*  Clinical Data: Cough, chest pain.  CT ANGIOGRAPHY CHEST  Technique:  Multidetector CT imaging of the chest using the standard protocol during bolus administration of  intravenous contrast. Multiplanar reconstructed images including MIPs were obtained and reviewed to evaluate the vascular anatomy.  Contrast: OMNIPAQUE IOHEXOL 350 MG/ML SOLN  Comparison: None.  Findings: Fairly good contrast opacification of pulmonary artery branches.  There is significant patient breathing during the acquisition which degrades multiple images.  No convincing filling defects to diagnose acute PE.  Bovine aortic arch anatomy without proximal stenosis.  No aortic dissection  or aneurysm.  No pleural or pericardial effusion. No hilar or mediastinal nodes greater than 1 cm short axis diameter.  Lungs are clear.  Spurring in the mid and lower thoracic spine.  IMPRESSION:  1.  No definite acute PE or thoracic aortic dissection.   Original Report Authenticated By: D. Andria Rhein, MD    EKG:  Rhythm: sinus tachycardia Vent. rate 112 BPM PR interval 148 ms QRS duration 78 ms QT/QTc 332/453 ms ST segments: NS ST changes   1. Viral illness   2. Cough       MDM  62 year old male with cough, chest pain and dyspnea. Noted to be febrile. CP is only when coughing and resolves in seconds. Very atypical for ACS. Patient's lungs actually sound clear on exam. His chest x-ray and subsequent CT showed no concerning infiltrate. There is no evidence of pulmonary embolism. His oxygen saturations have remained good on room air. He is not hypotensive. His renal function is normal. Pt appears somewhat uncomfortable but not toxic. He may have the flu. I think he is stable for discharge at this time, but strict return precautions were discussed and he understands the need to return for immediate re-evaluation for worsening symptoms, symptoms that fail to improve or anything else concerning to him.         Raeford Razor, MD 05/16/12 1925

## 2012-05-16 NOTE — ED Notes (Signed)
Radiology called that IV access has been establish, will pick up pt for CT angio

## 2012-05-16 NOTE — ED Notes (Signed)
Pt reports having productive cough since last night, having chest pain that occurs only when coughing and breathing, ekg done at triage. Airway intact, spo2 96%. Also having headache.

## 2015-04-03 DIAGNOSIS — I10 Essential (primary) hypertension: Secondary | ICD-10-CM | POA: Diagnosis not present

## 2015-05-08 ENCOUNTER — Emergency Department (HOSPITAL_COMMUNITY): Payer: Medicare Other

## 2015-05-08 ENCOUNTER — Inpatient Hospital Stay (HOSPITAL_COMMUNITY)
Admission: EM | Admit: 2015-05-08 | Discharge: 2015-05-11 | DRG: 065 | Disposition: A | Discharge status: Rehabilitation Facility | Payer: Medicare Other | Attending: Internal Medicine | Admitting: Internal Medicine

## 2015-05-08 ENCOUNTER — Encounter (HOSPITAL_COMMUNITY): Payer: Self-pay | Admitting: Cardiology

## 2015-05-08 DIAGNOSIS — Z79899 Other long term (current) drug therapy: Secondary | ICD-10-CM

## 2015-05-08 DIAGNOSIS — I639 Cerebral infarction, unspecified: Secondary | ICD-10-CM | POA: Diagnosis not present

## 2015-05-08 DIAGNOSIS — I651 Occlusion and stenosis of basilar artery: Secondary | ICD-10-CM | POA: Diagnosis present

## 2015-05-08 DIAGNOSIS — F1020 Alcohol dependence, uncomplicated: Secondary | ICD-10-CM | POA: Diagnosis present

## 2015-05-08 DIAGNOSIS — F17200 Nicotine dependence, unspecified, uncomplicated: Secondary | ICD-10-CM | POA: Insufficient documentation

## 2015-05-08 DIAGNOSIS — F1410 Cocaine abuse, uncomplicated: Secondary | ICD-10-CM | POA: Diagnosis present

## 2015-05-08 DIAGNOSIS — I69354 Hemiplegia and hemiparesis following cerebral infarction affecting left non-dominant side: Secondary | ICD-10-CM

## 2015-05-08 DIAGNOSIS — I739 Peripheral vascular disease, unspecified: Secondary | ICD-10-CM | POA: Diagnosis present

## 2015-05-08 DIAGNOSIS — H532 Diplopia: Secondary | ICD-10-CM | POA: Diagnosis present

## 2015-05-08 DIAGNOSIS — G8194 Hemiplegia, unspecified affecting left nondominant side: Secondary | ICD-10-CM

## 2015-05-08 DIAGNOSIS — F1010 Alcohol abuse, uncomplicated: Secondary | ICD-10-CM | POA: Insufficient documentation

## 2015-05-08 DIAGNOSIS — R531 Weakness: Secondary | ICD-10-CM

## 2015-05-08 DIAGNOSIS — Z8249 Family history of ischemic heart disease and other diseases of the circulatory system: Secondary | ICD-10-CM

## 2015-05-08 DIAGNOSIS — M6289 Other specified disorders of muscle: Secondary | ICD-10-CM | POA: Diagnosis not present

## 2015-05-08 DIAGNOSIS — F12129 Cannabis abuse with intoxication, unspecified: Secondary | ICD-10-CM | POA: Diagnosis present

## 2015-05-08 DIAGNOSIS — I619 Nontraumatic intracerebral hemorrhage, unspecified: Secondary | ICD-10-CM

## 2015-05-08 DIAGNOSIS — G459 Transient cerebral ischemic attack, unspecified: Secondary | ICD-10-CM

## 2015-05-08 DIAGNOSIS — R471 Dysarthria and anarthria: Secondary | ICD-10-CM | POA: Diagnosis present

## 2015-05-08 DIAGNOSIS — F1910 Other psychoactive substance abuse, uncomplicated: Secondary | ICD-10-CM | POA: Diagnosis not present

## 2015-05-08 DIAGNOSIS — M6281 Muscle weakness (generalized): Secondary | ICD-10-CM | POA: Diagnosis not present

## 2015-05-08 DIAGNOSIS — J069 Acute upper respiratory infection, unspecified: Secondary | ICD-10-CM | POA: Diagnosis present

## 2015-05-08 DIAGNOSIS — Z8673 Personal history of transient ischemic attack (TIA), and cerebral infarction without residual deficits: Secondary | ICD-10-CM | POA: Diagnosis present

## 2015-05-08 DIAGNOSIS — E785 Hyperlipidemia, unspecified: Secondary | ICD-10-CM | POA: Insufficient documentation

## 2015-05-08 DIAGNOSIS — I6523 Occlusion and stenosis of bilateral carotid arteries: Secondary | ICD-10-CM | POA: Diagnosis present

## 2015-05-08 DIAGNOSIS — I10 Essential (primary) hypertension: Secondary | ICD-10-CM | POA: Diagnosis present

## 2015-05-08 LAB — URINALYSIS, ROUTINE W REFLEX MICROSCOPIC
Bilirubin Urine: NEGATIVE
Glucose, UA: NEGATIVE mg/dL
Hgb urine dipstick: NEGATIVE
KETONES UR: 15 mg/dL — AB
LEUKOCYTES UA: NEGATIVE
NITRITE: NEGATIVE
PH: 5 (ref 5.0–8.0)
Protein, ur: NEGATIVE mg/dL

## 2015-05-08 LAB — TSH: TSH: 0.495 u[IU]/mL (ref 0.350–4.500)

## 2015-05-08 LAB — DIFFERENTIAL
Basophils Absolute: 0 10*3/uL (ref 0.0–0.1)
Basophils Relative: 0 %
EOS PCT: 0 %
Eosinophils Absolute: 0 10*3/uL (ref 0.0–0.7)
LYMPHS ABS: 1.2 10*3/uL (ref 0.7–4.0)
LYMPHS PCT: 14 %
MONO ABS: 0.9 10*3/uL (ref 0.1–1.0)
MONOS PCT: 11 %
Neutro Abs: 6.5 10*3/uL (ref 1.7–7.7)
Neutrophils Relative %: 75 %

## 2015-05-08 LAB — CBC
HCT: 43.7 % (ref 39.0–52.0)
HEMOGLOBIN: 14.2 g/dL (ref 13.0–17.0)
MCH: 29.5 pg (ref 26.0–34.0)
MCHC: 32.5 g/dL (ref 30.0–36.0)
MCV: 90.9 fL (ref 78.0–100.0)
Platelets: 206 10*3/uL (ref 150–400)
RBC: 4.81 MIL/uL (ref 4.22–5.81)
RDW: 12.5 % (ref 11.5–15.5)
WBC: 8.7 10*3/uL (ref 4.0–10.5)

## 2015-05-08 LAB — I-STAT CHEM 8, ED
BUN: 12 mg/dL (ref 6–20)
CALCIUM ION: 1.16 mmol/L (ref 1.13–1.30)
Chloride: 101 mmol/L (ref 101–111)
Creatinine, Ser: 1.2 mg/dL (ref 0.61–1.24)
Glucose, Bld: 85 mg/dL (ref 65–99)
HCT: 48 % (ref 39.0–52.0)
Hemoglobin: 16.3 g/dL (ref 13.0–17.0)
Potassium: 3.9 mmol/L (ref 3.5–5.1)
SODIUM: 138 mmol/L (ref 135–145)
TCO2: 24 mmol/L (ref 0–100)

## 2015-05-08 LAB — RAPID URINE DRUG SCREEN, HOSP PERFORMED
Amphetamines: NOT DETECTED
Barbiturates: NOT DETECTED
Benzodiazepines: NOT DETECTED
Cocaine: POSITIVE — AB
OPIATES: NOT DETECTED
TETRAHYDROCANNABINOL: NOT DETECTED

## 2015-05-08 LAB — I-STAT TROPONIN, ED: Troponin i, poc: 0 ng/mL (ref 0.00–0.08)

## 2015-05-08 LAB — COMPREHENSIVE METABOLIC PANEL
ALK PHOS: 53 U/L (ref 38–126)
ALT: 18 U/L (ref 17–63)
ANION GAP: 11 (ref 5–15)
AST: 21 U/L (ref 15–41)
Albumin: 3.6 g/dL (ref 3.5–5.0)
BILIRUBIN TOTAL: 1.2 mg/dL (ref 0.3–1.2)
BUN: 11 mg/dL (ref 6–20)
CALCIUM: 9 mg/dL (ref 8.9–10.3)
CO2: 24 mmol/L (ref 22–32)
Chloride: 103 mmol/L (ref 101–111)
Creatinine, Ser: 1.15 mg/dL (ref 0.61–1.24)
GFR calc non Af Amer: >60 mL/min (ref >60)
Glucose, Bld: 90 mg/dL (ref 65–99)
Potassium: 4.1 mmol/L (ref 3.5–5.1)
Sodium: 138 mmol/L (ref 135–145)
TOTAL PROTEIN: 6.8 g/dL (ref 6.5–8.1)

## 2015-05-08 LAB — APTT: aPTT: 28 seconds (ref 24–37)

## 2015-05-08 LAB — ETHANOL
Alcohol, Ethyl (B): <5 mg/dL (ref <5)
Alcohol, Ethyl (B): <5 mg/dL (ref <5)

## 2015-05-08 LAB — PROTIME-INR
INR: 1.08 (ref 0.00–1.49)
PROTHROMBIN TIME: 14.2 s (ref 11.6–15.2)

## 2015-05-08 LAB — CBG MONITORING, ED: Glucose-Capillary: 94 mg/dL (ref 65–99)

## 2015-05-08 LAB — AMMONIA: AMMONIA: 37 umol/L — AB (ref 9–35)

## 2015-05-08 MED ORDER — SODIUM CHLORIDE 0.9 % IV SOLN
1000.0000 mg | INTRAVENOUS | Status: AC
  Administered 2015-05-08: 1000 mg via INTRAVENOUS
  Filled 2015-05-08: qty 10

## 2015-05-08 MED ORDER — VITAMIN B-1 100 MG PO TABS
100.0000 mg | ORAL_TABLET | Freq: Every day | ORAL | Status: DC
  Administered 2015-05-09 – 2015-05-11 (×3): 100 mg via ORAL
  Filled 2015-05-08 (×3): qty 1

## 2015-05-08 MED ORDER — THERA M PLUS PO TABS: 1.0000 | ORAL_TABLET | Freq: Every day | ORAL | Status: DC

## 2015-05-08 MED ORDER — SENNOSIDES-DOCUSATE SODIUM 8.6-50 MG PO TABS: 1.0000 | ORAL_TABLET | Freq: Every evening | ORAL | Status: DC | PRN

## 2015-05-08 MED ORDER — STROKE: EARLY STAGES OF RECOVERY BOOK
Freq: Once | Status: AC
  Administered 2015-05-08: 17:00:00
  Filled 2015-05-08: qty 1

## 2015-05-08 MED ORDER — ASPIRIN 300 MG RE SUPP
300.0000 mg | Freq: Once | RECTAL | Status: AC
Start: 2015-05-08 — End: 2015-05-08
  Administered 2015-05-08: 300 mg via RECTAL
  Filled 2015-05-08: qty 1

## 2015-05-08 MED ORDER — ADULT MULTIVITAMIN W/MINERALS CH
1.0000 | ORAL_TABLET | Freq: Every day | ORAL | Status: DC
  Administered 2015-05-10 – 2015-05-11 (×2): 1 via ORAL
  Filled 2015-05-08 (×3): qty 1

## 2015-05-08 MED ORDER — ENOXAPARIN SODIUM 40 MG/0.4ML ~~LOC~~ SOLN
40.0000 mg | SUBCUTANEOUS | Status: DC
  Administered 2015-05-08 – 2015-05-10 (×3): 40 mg via SUBCUTANEOUS
  Filled 2015-05-08 (×3): qty 0.4

## 2015-05-08 MED ORDER — SODIUM CHLORIDE 0.9 % IV SOLN: 500.0000 mg | Freq: Two times a day (BID) | INTRAVENOUS | Status: DC

## 2015-05-08 MED ORDER — SODIUM CHLORIDE 0.9 % IV SOLN: INTRAVENOUS | Status: DC

## 2015-05-08 MED ORDER — THIAMINE HCL 100 MG/ML IJ SOLN
100.0000 mg | Freq: Every day | INTRAMUSCULAR | Status: DC
  Administered 2015-05-08: 100 mg via INTRAVENOUS
  Filled 2015-05-08: qty 2

## 2015-05-08 MED ORDER — ADULT MULTIVITAMIN W/MINERALS CH
1.0000 | ORAL_TABLET | Freq: Every day | ORAL | Status: DC
  Administered 2015-05-09: 1 via ORAL

## 2015-05-08 MED ORDER — LORAZEPAM 2 MG/ML IJ SOLN: 0.0000 mg | Freq: Two times a day (BID) | INTRAMUSCULAR | Status: DC

## 2015-05-08 MED ORDER — LORAZEPAM 1 MG PO TABS: 1.0000 mg | ORAL_TABLET | Freq: Four times a day (QID) | ORAL | Status: DC | PRN

## 2015-05-08 MED ORDER — LORAZEPAM 2 MG/ML IJ SOLN: 1.0000 mg | Freq: Four times a day (QID) | INTRAMUSCULAR | Status: DC | PRN

## 2015-05-08 MED ORDER — ASPIRIN EC 81 MG PO TBEC: 81.0000 mg | DELAYED_RELEASE_TABLET | Freq: Every day | ORAL | Status: DC

## 2015-05-08 MED ORDER — FOLIC ACID 1 MG PO TABS
1.0000 mg | ORAL_TABLET | Freq: Every day | ORAL | Status: DC
  Administered 2015-05-09 – 2015-05-11 (×3): 1 mg via ORAL
  Filled 2015-05-08 (×3): qty 1

## 2015-05-08 MED ORDER — IOHEXOL 350 MG/ML SOLN
100.0000 mL | Freq: Once | INTRAVENOUS | Status: AC | PRN
  Administered 2015-05-08: 100 mL via INTRAVENOUS

## 2015-05-08 MED ORDER — GUAIFENESIN-CODEINE 100-10 MG/5ML PO SOLN
5.0000 mL | Freq: Three times a day (TID) | ORAL | Status: DC | PRN
  Administered 2015-05-10 – 2015-05-11 (×2): 5 mL via ORAL
  Filled 2015-05-08 (×4): qty 5

## 2015-05-08 MED ORDER — LORAZEPAM 2 MG/ML IJ SOLN: 0.0000 mg | Freq: Four times a day (QID) | INTRAMUSCULAR | Status: AC

## 2015-05-08 MED ORDER — SIMVASTATIN 20 MG PO TABS
20.0000 mg | ORAL_TABLET | Freq: Every day | ORAL | Status: DC
  Administered 2015-05-09 – 2015-05-11 (×3): 20 mg via ORAL
  Filled 2015-05-08 (×3): qty 1

## 2015-05-08 NOTE — ED Notes (Signed)
Talked with CT to  Inform that patient needs CT done stat.

## 2015-05-08 NOTE — ED Provider Notes (Signed)
CSN: [id number]     Arrival date & time 05/08/15  1345 History   First MD Initiated Contact with Patient 05/08/15 1412     Chief Complaint  Patient presents with  . Cerebrovascular Accident  . Weakness     (Consider location/radiation/quality/duration/timing/severity/associated sxs/prior Treatment) HPI  Patient is a 65 year old male with history of hypertension hypertension alcohol dependence and cerebellar stroke.  He is presenting today with waking up and difficulty moving his left arm and left leg and facial weakness with slurred speech.   Patient had admission for stroke in 2012. At that time he had slurred speech according to notes.   Patient reports the arm and leg weakness is new this morning. Went to bed normally last night.        Past Medical History  Diagnosis Date  . Hypertension   . Stroke [facility])    Past Surgical History  Procedure Laterality Date  . Abdominal surgery     Family History  Problem Relation Age of Onset  . Hypertension Mother   . Hypertension Father    Social History  Substance Use Topics  . Smoking status: Current Some Day Smoker  . Smokeless tobacco: None  . Alcohol Use: Yes    Review of Systems  Unable to perform ROS: Acuity of condition      Allergies  Review of patient's allergies indicates no known allergies.  Home Medications   Prior to Admission medications   Medication Sig Start Date End Date Taking? Authorizing Provider  lisinopril (PRINIVIL,ZESTRIL) 10 MG tablet Take 10 mg by mouth daily. 04/03/15  Yes Historical Provider, MD  Multiple Vitamins-Minerals (MULTIVITAMINS THER. W/MINERALS) TABS Take 1 tablet by mouth daily. 04/03/11  Yes Modena Jansky, MD  simvastatin (ZOCOR) 20 MG tablet Take 20 mg by mouth daily. 04/03/15  Yes Historical Provider, MD  amLODipine (NORVASC) 10 MG tablet Take 1 tablet (10 mg total) by mouth daily. 04/03/11   Modena Jansky, MD  guaiFENesin-codeine (ROBITUSSIN AC) 100-10 MG/5ML syrup  Take 5 mLs by mouth 3 (three) times daily as needed for cough. Patient not taking: Reported on 05/08/2015 05/16/12   Virgel Manifold, MD  metoprolol (LOPRESSOR) 100 MG tablet Take 100 mg by mouth 2 (two) times daily.    Historical Provider, MD   BP 128/111 mmHg  Pulse 83  Temp(Src) 98.1 F (36.7 C) (Oral)  Resp 18  Wt 198 lb (89.812 kg)  SpO2 97% Physical Exam  Constitutional: He is oriented to person, place, and time. He appears well-nourished.  HENT:  Head: Normocephalic.  Mouth/Throat: Oropharynx is clear and moist.  Eyes: Conjunctivae are normal.  Neck: No tracheal deviation present.  Cardiovascular: Normal rate.   Pulmonary/Chest: Effort normal. No stridor. He has wheezes.  Abdominal: Soft. There is no tenderness. There is no guarding.  Musculoskeletal: Normal range of motion. He exhibits no edema.  Sided facial weakness. Left arm weakness 4 out of 5. Left leg weakness 3 out of 5.  Tongue midline.  Slurred speech.  Neurological: He is oriented to person, place, and time. A cranial nerve deficit is present.  Skin: Skin is warm and dry. No rash noted. He is not diaphoretic.  Psychiatric: He has a normal mood and affect.  Nursing note and vitals reviewed.   ED Course  Procedures (including critical care time) Labs Review Labs Reviewed  CULTURE, BLOOD (ROUTINE X 2)  CULTURE, BLOOD (ROUTINE X 2)  ETHANOL  PROTIME-INR  APTT  CBC  DIFFERENTIAL  COMPREHENSIVE METABOLIC PANEL  URINE RAPID DRUG SCREEN, HOSP PERFORMED  URINALYSIS, ROUTINE W REFLEX MICROSCOPIC (NOT AT [facility])  AMMONIA  TSH  ETHANOL  CBG MONITORING, ED  I-STAT CHEM 8, ED  I-STAT TROPOININ, ED    Imaging Review Ct Angio Head W/cm &/or Wo Cm  05/08/2015  CLINICAL DATA:  Left-sided weakness EXAM: CT ANGIOGRAPHY HEAD AND NECK TECHNIQUE: Multidetector CT imaging of the head and neck was performed using the standard protocol during bolus administration of intravenous contrast. Multiplanar CT image reconstructions  and MIPs were obtained to evaluate the vascular anatomy. Carotid stenosis measurements (when applicable) are obtained utilizing NASCET criteria, using the distal internal carotid diameter as the denominator. CONTRAST:  194mL OMNIPAQUE IOHEXOL 350 MG/ML SOLN COMPARISON:  CT head today and 03/28/2011 FINDINGS: CTA NECK Aortic arch: Proximal great vessels patent with mild atherosclerotic calcification at the origin of the innominate artery. Mild apical emphysema bilaterally. Right carotid system: Right common carotid artery widely patent. Calcified plaque in the right carotid bulb. 60% diameter stenosis right internal carotid artery. Right external carotid artery widely patent. Remainder of the right internal carotid artery widely patent. Left carotid system: Left common carotid artery widely patent. Atherosclerotic calcified plaque at the carotid bulb on the left with 50% diameter stenosis. Left external carotid artery widely patent. Left internal carotid artery widely patent. Vertebral arteries:Both vertebral arteries patent to the basilar. No proximal stenosis. Moderate stenosis distal vertebral artery bilaterally due to calcific plaque. Skeleton: Moderate to advanced cervical spondylosis. No fracture or bony lesion. Other neck: Multiple subcutaneous cysts are present in the face bilaterally. These are mostly fluid filled however several also contain gas. 15 mm submandibular nodule on the right contains gas. Larger nodule anterior to the left parotid gland measures 26 x 17 mm and contains gas. Surrounding subcutaneous fat does not show significant stranding. CTA HEAD Anterior circulation: Atherosclerotic calcification in the cavernous carotid bilaterally with moderate stenosis bilaterally, left greater than right. Moderate stenosis distal left M1 segment. Hypoplastic left A1 segment. Both anterior cerebral arteries supplied from the right and are patent. Right middle cerebral artery is patent with mild stenosis of  the parietal branch of the left middle cerebral artery. Posterior circulation: Moderate stenosis distal vertebral artery due to calcific plaque. Moderate stenosis mid basilar. AICA patent bilaterally. Superior cerebellar and posterior cerebral arteries patent bilaterally. Fetal origin left posterior cerebral artery. Hypoplastic left P1 segment. Venous sinuses: Patent Anatomic variants: Negative for aneurysm Delayed phase: Normal enhancement on delayed imaging. IMPRESSION: No acute intracranial abnormality. Separate CT head results. See separate CT perfusion results. 60% diameter stenosis proximal right internal carotid artery. 50% diameter stenosis proximal left internal carotid artery. Moderate stenosis through the carotid siphon bilaterally. Moderate stenosis distal vertebral artery bilaterally. Moderate stenosis mid basilar artery. Moderate stenosis distal left M1 segment. Mild stenosis right middle cerebral artery branch to the parietal lobe. Multiple subcutaneous cysts in the face containing gas. These may represent infected dermal cysts. Critical Value/emergent results were called by telephone at the time of interpretation on 05/08/2015 at 3:45 pm to Dr. Faythe Dingwall, who verbally acknowledged these results. Electronically Signed   By: Franchot Gallo M.D.   On: 05/08/2015 15:57   Dg Chest 2 View  05/08/2015  CLINICAL DATA:  Cough for 2 days EXAM: CHEST  2 VIEW COMPARISON:  Chest radiograph and chest CT May 16, 2012 FINDINGS: There is no edema or consolidation. Heart is borderline enlarged with pulmonary vascularity within normal limits. No adenopathy. No bone lesions. IMPRESSION: Mild cardiac enlargement.  No  edema or consolidation. Electronically Signed   By: Lowella Grip III M.D.   On: 05/08/2015 14:51   Ct Head Wo Contrast  05/08/2015  CLINICAL DATA:  Left-sided weakness.  Stroke. EXAM: CT HEAD WITHOUT CONTRAST TECHNIQUE: Contiguous axial images were obtained from the base of the skull through the  vertex without intravenous contrast. COMPARISON:  CT head 03/28/2011 FINDINGS: Mild atrophy.  Negative for hydrocephalus Chronic microvascular ischemic change in the white matter. Chronic lacune in the left medial putamen. Chronic infarction left head of caudate. Chronic infarct left inferior cerebellum. Negative for acute infarct. Negative for acute hemorrhage or mass lesion. No shift of the midline structures. Atherosclerotic calcification of the carotid and vertebral arteries bilaterally Mucosal edema paranasal sinuses.  Negative calvarium IMPRESSION: Atrophy and chronic ischemic change. No acute intracranial abnormality Critical Value/emergent results were called by telephone at the time of interpretation on 05/08/2015 at 3:45 pm to Dr. Faythe Dingwall , who verbally acknowledged these results. Electronically Signed   By: Franchot Gallo M.D.   On: 05/08/2015 15:59   Ct Angio Neck W/cm &/or Wo/cm  05/08/2015  CLINICAL DATA:  Left-sided weakness EXAM: CT ANGIOGRAPHY HEAD AND NECK TECHNIQUE: Multidetector CT imaging of the head and neck was performed using the standard protocol during bolus administration of intravenous contrast. Multiplanar CT image reconstructions and MIPs were obtained to evaluate the vascular anatomy. Carotid stenosis measurements (when applicable) are obtained utilizing NASCET criteria, using the distal internal carotid diameter as the denominator. CONTRAST:  152mL OMNIPAQUE IOHEXOL 350 MG/ML SOLN COMPARISON:  CT head today and 03/28/2011 FINDINGS: CTA NECK Aortic arch: Proximal great vessels patent with mild atherosclerotic calcification at the origin of the innominate artery. Mild apical emphysema bilaterally. Right carotid system: Right common carotid artery widely patent. Calcified plaque in the right carotid bulb. 60% diameter stenosis right internal carotid artery. Right external carotid artery widely patent. Remainder of the right internal carotid artery widely patent. Left carotid system:  Left common carotid artery widely patent. Atherosclerotic calcified plaque at the carotid bulb on the left with 50% diameter stenosis. Left external carotid artery widely patent. Left internal carotid artery widely patent. Vertebral arteries:Both vertebral arteries patent to the basilar. No proximal stenosis. Moderate stenosis distal vertebral artery bilaterally due to calcific plaque. Skeleton: Moderate to advanced cervical spondylosis. No fracture or bony lesion. Other neck: Multiple subcutaneous cysts are present in the face bilaterally. These are mostly fluid filled however several also contain gas. 15 mm submandibular nodule on the right contains gas. Larger nodule anterior to the left parotid gland measures 26 x 17 mm and contains gas. Surrounding subcutaneous fat does not show significant stranding. CTA HEAD Anterior circulation: Atherosclerotic calcification in the cavernous carotid bilaterally with moderate stenosis bilaterally, left greater than right. Moderate stenosis distal left M1 segment. Hypoplastic left A1 segment. Both anterior cerebral arteries supplied from the right and are patent. Right middle cerebral artery is patent with mild stenosis of the parietal branch of the left middle cerebral artery. Posterior circulation: Moderate stenosis distal vertebral artery due to calcific plaque. Moderate stenosis mid basilar. AICA patent bilaterally. Superior cerebellar and posterior cerebral arteries patent bilaterally. Fetal origin left posterior cerebral artery. Hypoplastic left P1 segment. Venous sinuses: Patent Anatomic variants: Negative for aneurysm Delayed phase: Normal enhancement on delayed imaging. IMPRESSION: No acute intracranial abnormality. Separate CT head results. See separate CT perfusion results. 60% diameter stenosis proximal right internal carotid artery. 50% diameter stenosis proximal left internal carotid artery. Moderate stenosis through the carotid siphon bilaterally.  Moderate  stenosis distal vertebral artery bilaterally. Moderate stenosis mid basilar artery. Moderate stenosis distal left M1 segment. Mild stenosis right middle cerebral artery branch to the parietal lobe. Multiple subcutaneous cysts in the face containing gas. These may represent infected dermal cysts. Critical Value/emergent results were called by telephone at the time of interpretation on 05/08/2015 at 3:45 pm to Dr. Faythe Dingwall, who verbally acknowledged these results. Electronically Signed   By: Franchot Gallo M.D.   On: 05/08/2015 15:57   Ct Cerebral Perfusion W/cm  05/08/2015  CLINICAL DATA:  Left-sided weakness.  Stroke. EXAM: CT CEREBRAL PERFUSION WITH CONTRAST TECHNIQUE: CT perfusion was obtained through the basal ganglia and cerebral hemispheres. Computer generator calculations of mean transit time, time to peak perfusion, and cerebral blood volume were obtained. CONTRAST:  181mL OMNIPAQUE IOHEXOL 350 MG/ML SOLN COMPARISON:  CT head today FINDINGS: No areas of abnormal perfusion identified. Normal mean transit time. Normal cerebral blood volume. No evidence of infarct or acute ischemia. IMPRESSION: Negative CT perfusion Critical Value/emergent results were called by telephone at the time of interpretation on 05/08/2015 at 3:45 Pm to Dr. Faythe Dingwall , who verbally acknowledged these results. Electronically Signed   By: Franchot Gallo M.D.   On: 05/08/2015 16:02   I have personally reviewed and evaluated these images and lab results as part of my medical decision-making.   EKG Interpretation   Date/Time:  Tuesday May 08 2015 13:51:34 EST Ventricular Rate:  79 PR Interval:  167 QRS Duration: 88 QT Interval:  410 QTC Calculation: 470 R Axis:     Text Interpretation:  Sinus rhythm Probable anteroseptal infarct, old  minimal STE evelation in V2 V3, sloping reassuring. No significant change  since last tracing Confirmed by Gerald Leitz ([id number]) on 05/08/2015  4:25:37 PM      MDM   Final diagnoses:   Left-sided weakness  Stroke [facility])   patient is a 65 year old male with history of CVA presenting today with new CVA symptoms. Patient went to bed last night normal woke up this morning with difficult to moving left arm and left leg and difficulty speaking. Patient within 16 hour time window.  Will get CT and CTA brain to evaluate for large occlusion ammenable by VIR.  Neurology consulted.   4:25 PM Seen by neurology.  CT shows no large occlusion. Neurology ordered MRI. Also they are concerned about seizure. Will Keppra load.  Patient is a difficult historian.  Filed Vitals:   05/08/15 1530 05/08/15 1615  BP: 152/83 128/111  Pulse: 80 83  Temp:    Resp: 20 18   Vitals stable, 96% on RA.   CT calling infected dermoid cysts. Patietn at MRI, will assess on return.   Dariona Postma Julio Alm, MD 05/08/15 [phone]

## 2015-05-08 NOTE — H&P (Signed)
History and Physical    Andre [name] [id number] DOB: 1950-11-04 DOA: 05/08/2015  Referring physician: Dr. Thomasene Lot PCP: No primary care provider on file.  Specialists: neurology   Chief Complaint: left sided weakness  HPI: Andre [name] is a 65 y.o. male has a past medical history significant for stroke, hypertension, alcohol dependence and polysubstance abuse, presents to the emergency room with difficulties moving his left arm and left leg as well as weakness in his face and slurred speech. Patient reports that he was out drinking with his friends all night long and went to bed in the early hours, when he woke up in the afternoon he notes he couldn't move his left side. He has a history of polysubstance abuse with alcohol, marijuana and cocaine. He is a very poor historian and when asked about other drugs he tells me "a lot". He denies any fevers or chills, denies chest pain / palpitations. He is alert and oriented. In the emergency room, patient had persistent left-sided weakness, his blood work was fairly unremarkable, his UDS is pending currently, he underwent a CT scan of the brain which is negative as well as a CT angiogram of the head and neck without any acute intracranial abnormalities however did show several areas of moderate stenosis. He was initially hypertensive however his blood pressure came down and his vital signs are otherwise unremarkable. He failed bedside swallow screen.  Review of Systems: As per history of present illness, otherwise 10 point review of systems negative  Past Medical History  Diagnosis Date  . Hypertension   . Stroke [facility])    Past Surgical History  Procedure Laterality Date  . Abdominal surgery     Social History:  reports that he has been smoking.  He does not have any smokeless tobacco history on file. He reports that he drinks alcohol. His drug history is not on file.  No Known Allergies  Family History  Problem Relation Age of  Onset  . Hypertension Mother   . Hypertension Father    Prior to Admission medications   Medication Sig Start Date End Date Taking? Authorizing Provider  lisinopril (PRINIVIL,ZESTRIL) 10 MG tablet Take 10 mg by mouth daily. 04/03/15  Yes Historical Provider, MD  Multiple Vitamins-Minerals (MULTIVITAMINS THER. W/MINERALS) TABS Take 1 tablet by mouth daily. 04/03/11  Yes Modena Jansky, MD  simvastatin (ZOCOR) 20 MG tablet Take 20 mg by mouth daily. 04/03/15  Yes Historical Provider, MD  amLODipine (NORVASC) 10 MG tablet Take 1 tablet (10 mg total) by mouth daily. 04/03/11   Modena Jansky, MD  guaiFENesin-codeine (ROBITUSSIN AC) 100-10 MG/5ML syrup Take 5 mLs by mouth 3 (three) times daily as needed for cough. Patient not taking: Reported on 05/08/2015 05/16/12   Virgel Manifold, MD  metoprolol (LOPRESSOR) 100 MG tablet Take 100 mg by mouth 2 (two) times daily.    Historical Provider, MD   Physical Exam: Filed Vitals:   05/08/15 1353 05/08/15 1425 05/08/15 1530 05/08/15 1615  BP: 185/175  152/83 128/111  Pulse: 78  80 83  Temp: 98.2 F (36.8 C) 98.1 F (36.7 C)    TempSrc: Oral     Resp: 18  20 18   Weight: 89.812 kg (198 lb)     SpO2: 97%  97% 97%    GENERAL: NAD  HEENT: head NCAT, no scleral icterus. Pupils round and reactive. Mucous membranes are moist. Posterior pharynx clear of any exudate or lesions.  NECK: Supple. No carotid bruits.  LUNGS: Clear to auscultation. No wheezing or crackles  HEART: Regular rate and rhythm without murmur. 2+ pulses, no JVD, no peripheral edema  ABDOMEN: Soft, nontender, and nondistended. Positive bowel sounds.   EXTREMITIES: Without any cyanosis, clubbing, rash, lesions or edema.  NEUROLOGIC: Alert and oriented x3. Patient exhibits slurred speech. 0/5 on left arm, antigravity on left LE. Facial droop   Labs on Admission:  Basic Metabolic Panel:  Recent Labs Lab 05/08/15 1435 05/08/15 1444  NA 138 138  K 4.1 3.9  CL 103 101  CO2  24  --   GLUCOSE 90 85  BUN 11 12  CREATININE 1.15 1.20  CALCIUM 9.0  --    Liver Function Tests:  Recent Labs Lab 05/08/15 1435  AST 21  ALT 18  ALKPHOS 53  BILITOT 1.2  PROT 6.8  ALBUMIN 3.6   CBC:  Recent Labs Lab 05/08/15 1435 05/08/15 1444  WBC 8.7  --   NEUTROABS 6.5  --   HGB 14.2 16.3  HCT 43.7 48.0  MCV 90.9  --   PLT 206  --    CBG:  Recent Labs Lab 05/08/15 1359  GLUCAP 94    Radiological Exams on Admission: Ct Angio Head W/cm &/or Wo Cm  05/08/2015  CLINICAL DATA:  Left-sided weakness EXAM: CT ANGIOGRAPHY HEAD AND NECK TECHNIQUE: Multidetector CT imaging of the head and neck was performed using the standard protocol during bolus administration of intravenous contrast. Multiplanar CT image reconstructions and MIPs were obtained to evaluate the vascular anatomy. Carotid stenosis measurements (when applicable) are obtained utilizing NASCET criteria, using the distal internal carotid diameter as the denominator. CONTRAST:  148mL OMNIPAQUE IOHEXOL 350 MG/ML SOLN COMPARISON:  CT head today and 03/28/2011 FINDINGS: CTA NECK Aortic arch: Proximal great vessels patent with mild atherosclerotic calcification at the origin of the innominate artery. Mild apical emphysema bilaterally. Right carotid system: Right common carotid artery widely patent. Calcified plaque in the right carotid bulb. 60% diameter stenosis right internal carotid artery. Right external carotid artery widely patent. Remainder of the right internal carotid artery widely patent. Left carotid system: Left common carotid artery widely patent. Atherosclerotic calcified plaque at the carotid bulb on the left with 50% diameter stenosis. Left external carotid artery widely patent. Left internal carotid artery widely patent. Vertebral arteries:Both vertebral arteries patent to the basilar. No proximal stenosis. Moderate stenosis distal vertebral artery bilaterally due to calcific plaque. Skeleton: Moderate to  advanced cervical spondylosis. No fracture or bony lesion. Other neck: Multiple subcutaneous cysts are present in the face bilaterally. These are mostly fluid filled however several also contain gas. 15 mm submandibular nodule on the right contains gas. Larger nodule anterior to the left parotid gland measures 26 x 17 mm and contains gas. Surrounding subcutaneous fat does not show significant stranding. CTA HEAD Anterior circulation: Atherosclerotic calcification in the cavernous carotid bilaterally with moderate stenosis bilaterally, left greater than right. Moderate stenosis distal left M1 segment. Hypoplastic left A1 segment. Both anterior cerebral arteries supplied from the right and are patent. Right middle cerebral artery is patent with mild stenosis of the parietal branch of the left middle cerebral artery. Posterior circulation: Moderate stenosis distal vertebral artery due to calcific plaque. Moderate stenosis mid basilar. AICA patent bilaterally. Superior cerebellar and posterior cerebral arteries patent bilaterally. Fetal origin left posterior cerebral artery. Hypoplastic left P1 segment. Venous sinuses: Patent Anatomic variants: Negative for aneurysm Delayed phase: Normal enhancement on delayed imaging. IMPRESSION: No acute intracranial abnormality. Separate CT head results.  See separate CT perfusion results. 60% diameter stenosis proximal right internal carotid artery. 50% diameter stenosis proximal left internal carotid artery. Moderate stenosis through the carotid siphon bilaterally. Moderate stenosis distal vertebral artery bilaterally. Moderate stenosis mid basilar artery. Moderate stenosis distal left M1 segment. Mild stenosis right middle cerebral artery branch to the parietal lobe. Multiple subcutaneous cysts in the face containing gas. These may represent infected dermal cysts. Critical Value/emergent results were called by telephone at the time of interpretation on 05/08/2015 at 3:45 pm to Dr.  Faythe Dingwall, who verbally acknowledged these results. Electronically Signed   By: Franchot Gallo M.D.   On: 05/08/2015 15:57   Dg Chest 2 View  05/08/2015  CLINICAL DATA:  Cough for 2 days EXAM: CHEST  2 VIEW COMPARISON:  Chest radiograph and chest CT May 16, 2012 FINDINGS: There is no edema or consolidation. Heart is borderline enlarged with pulmonary vascularity within normal limits. No adenopathy. No bone lesions. IMPRESSION: Mild cardiac enlargement.  No edema or consolidation. Electronically Signed   By: Lowella Grip III M.D.   On: 05/08/2015 14:51   Ct Head Wo Contrast  05/08/2015  CLINICAL DATA:  Left-sided weakness.  Stroke. EXAM: CT HEAD WITHOUT CONTRAST TECHNIQUE: Contiguous axial images were obtained from the base of the skull through the vertex without intravenous contrast. COMPARISON:  CT head 03/28/2011 FINDINGS: Mild atrophy.  Negative for hydrocephalus Chronic microvascular ischemic change in the white matter. Chronic lacune in the left medial putamen. Chronic infarction left head of caudate. Chronic infarct left inferior cerebellum. Negative for acute infarct. Negative for acute hemorrhage or mass lesion. No shift of the midline structures. Atherosclerotic calcification of the carotid and vertebral arteries bilaterally Mucosal edema paranasal sinuses.  Negative calvarium IMPRESSION: Atrophy and chronic ischemic change. No acute intracranial abnormality Critical Value/emergent results were called by telephone at the time of interpretation on 05/08/2015 at 3:45 pm to Dr. Faythe Dingwall , who verbally acknowledged these results. Electronically Signed   By: Franchot Gallo M.D.   On: 05/08/2015 15:59   Ct Angio Neck W/cm &/or Wo/cm  05/08/2015  CLINICAL DATA:  Left-sided weakness EXAM: CT ANGIOGRAPHY HEAD AND NECK TECHNIQUE: Multidetector CT imaging of the head and neck was performed using the standard protocol during bolus administration of intravenous contrast. Multiplanar CT image reconstructions  and MIPs were obtained to evaluate the vascular anatomy. Carotid stenosis measurements (when applicable) are obtained utilizing NASCET criteria, using the distal internal carotid diameter as the denominator. CONTRAST:  127mL OMNIPAQUE IOHEXOL 350 MG/ML SOLN COMPARISON:  CT head today and 03/28/2011 FINDINGS: CTA NECK Aortic arch: Proximal great vessels patent with mild atherosclerotic calcification at the origin of the innominate artery. Mild apical emphysema bilaterally. Right carotid system: Right common carotid artery widely patent. Calcified plaque in the right carotid bulb. 60% diameter stenosis right internal carotid artery. Right external carotid artery widely patent. Remainder of the right internal carotid artery widely patent. Left carotid system: Left common carotid artery widely patent. Atherosclerotic calcified plaque at the carotid bulb on the left with 50% diameter stenosis. Left external carotid artery widely patent. Left internal carotid artery widely patent. Vertebral arteries:Both vertebral arteries patent to the basilar. No proximal stenosis. Moderate stenosis distal vertebral artery bilaterally due to calcific plaque. Skeleton: Moderate to advanced cervical spondylosis. No fracture or bony lesion. Other neck: Multiple subcutaneous cysts are present in the face bilaterally. These are mostly fluid filled however several also contain gas. 15 mm submandibular nodule on the right contains gas. Larger nodule anterior  to the left parotid gland measures 26 x 17 mm and contains gas. Surrounding subcutaneous fat does not show significant stranding. CTA HEAD Anterior circulation: Atherosclerotic calcification in the cavernous carotid bilaterally with moderate stenosis bilaterally, left greater than right. Moderate stenosis distal left M1 segment. Hypoplastic left A1 segment. Both anterior cerebral arteries supplied from the right and are patent. Right middle cerebral artery is patent with mild stenosis of  the parietal branch of the left middle cerebral artery. Posterior circulation: Moderate stenosis distal vertebral artery due to calcific plaque. Moderate stenosis mid basilar. AICA patent bilaterally. Superior cerebellar and posterior cerebral arteries patent bilaterally. Fetal origin left posterior cerebral artery. Hypoplastic left P1 segment. Venous sinuses: Patent Anatomic variants: Negative for aneurysm Delayed phase: Normal enhancement on delayed imaging. IMPRESSION: No acute intracranial abnormality. Separate CT head results. See separate CT perfusion results. 60% diameter stenosis proximal right internal carotid artery. 50% diameter stenosis proximal left internal carotid artery. Moderate stenosis through the carotid siphon bilaterally. Moderate stenosis distal vertebral artery bilaterally. Moderate stenosis mid basilar artery. Moderate stenosis distal left M1 segment. Mild stenosis right middle cerebral artery branch to the parietal lobe. Multiple subcutaneous cysts in the face containing gas. These may represent infected dermal cysts. Critical Value/emergent results were called by telephone at the time of interpretation on 05/08/2015 at 3:45 pm to Dr. Faythe Dingwall, who verbally acknowledged these results. Electronically Signed   By: Franchot Gallo M.D.   On: 05/08/2015 15:57   Ct Cerebral Perfusion W/cm  05/08/2015  CLINICAL DATA:  Left-sided weakness.  Stroke. EXAM: CT CEREBRAL PERFUSION WITH CONTRAST TECHNIQUE: CT perfusion was obtained through the basal ganglia and cerebral hemispheres. Computer generator calculations of mean transit time, time to peak perfusion, and cerebral blood volume were obtained. CONTRAST:  154mL OMNIPAQUE IOHEXOL 350 MG/ML SOLN COMPARISON:  CT head today FINDINGS: No areas of abnormal perfusion identified. Normal mean transit time. Normal cerebral blood volume. No evidence of infarct or acute ischemia. IMPRESSION: Negative CT perfusion Critical Value/emergent results were called by  telephone at the time of interpretation on 05/08/2015 at 3:45 Pm to Dr. Faythe Dingwall , who verbally acknowledged these results. Electronically Signed   By: Franchot Gallo M.D.   On: 05/08/2015 16:02    EKG: Independently reviewed. Sinus rhythm  Assessment/Plan Active Problems:   Polysubstance abuse   TIA (transient ischemic attack)   Left sided weakness  - possible CVA vs seizure at home with Todd's paralysis if patient had an alcohol related withdrawal seizure. MRI pending. - Neurology was consulted, evaluated patient in the ED, recommended admission for TIA workup and he was started on Keppra 1 g loading dose in the ED and now on Keppra 500 twice a day - for TIA obtain 2D echo, no carotids needed given CT A - was given aspirin  Alcohol abuse - CIWA protocol  Hypertension - Hold home blood pressure medications 12 permissive hypertension, will resume if MRI is negative  Hyperlipidemia  - continue home statin  Polysubstance abuse - UDS pending   Diet: NPO Fluids: None DVT Prophylaxis: Lovenox  Code Status: Full  Family Communication: no family bedside  Disposition Plan: admit to telemetry    Koki Buxton M. Cruzita Lederer, MD [facility] Pager [phone]  If 7PM-7AM, please contact night-coverage [email or web address] Password [facility] 05/08/2015, 4:43 PM

## 2015-05-08 NOTE — Consult Note (Addendum)
Requesting Physician: Dr. Thomasene Lot    Chief Complaint:  stroke  HPI:                                                                                                                                         Andre [name] is an 65 y.o. male who is a poor historian.  Patient states he was out drinking with his friends last night and did not go to bed until 0500 hours this AM. He awoke around 1400 hours and noted he could not move his left side and had a facial droop. He states he has some weakness from previous stroke but the symptoms have become much more pronounced. No family is available at bedside for corroborating history.    Date last known well: 05/08/2015 Time last known well: Time: 05:00 tPA Given: No: out of time window    Past Medical History  Diagnosis Date  . Hypertension   . Stroke [facility])     Past Surgical History  Procedure Laterality Date  . Abdominal surgery      Family History  Problem Relation Age of Onset  . Hypertension Mother   . Hypertension Father    Social History:  reports that he has been smoking.  He does not have any smokeless tobacco history on file. He reports that he drinks alcohol. His drug history is not on file.  Allergies: No Known Allergies  Medications:                                                                                                                           No current facility-administered medications for this encounter.   Current Outpatient Prescriptions  Medication Sig Dispense Refill  . lisinopril (PRINIVIL,ZESTRIL) 10 MG tablet Take 10 mg by mouth daily.  0  . Multiple Vitamins-Minerals (MULTIVITAMINS THER. W/MINERALS) TABS Take 1 tablet by mouth daily. 30 each 0  . simvastatin (ZOCOR) 20 MG tablet Take 20 mg by mouth daily.  0  . amLODipine (NORVASC) 10 MG tablet Take 1 tablet (10 mg total) by mouth daily. 30 tablet 0  . guaiFENesin-codeine (ROBITUSSIN AC) 100-10 MG/5ML syrup Take 5 mLs by mouth 3 (three) times daily  as needed for cough. (Patient not taking: Reported on 05/08/2015) 120 mL 0  . metoprolol (LOPRESSOR) 100 MG tablet Take 100 mg by mouth 2 (two) times daily.  Current facility-administered medications:  .  0.9 %  sodium chloride infusion, , Intravenous, Continuous, Aleksa Catterton Fuller Mandril, MD .  aspirin suppository 300 mg, 300 mg, Rectal, Once, Courteney Lyn Mackuen, MD .  levETIRAcetam (KEPPRA) 1,000 mg in sodium chloride 0.9 % 100 mL IVPB, 1,000 mg, Intravenous, STAT, Marliss Coots, PA-C  Current outpatient prescriptions:  .  lisinopril (PRINIVIL,ZESTRIL) 10 MG tablet, Take 10 mg by mouth daily., Disp: , Rfl: 0 .  Multiple Vitamins-Minerals (MULTIVITAMINS THER. W/MINERALS) TABS, Take 1 tablet by mouth daily., Disp: 30 each, Rfl: 0 .  simvastatin (ZOCOR) 20 MG tablet, Take 20 mg by mouth daily., Disp: , Rfl: 0 .  amLODipine (NORVASC) 10 MG tablet, Take 1 tablet (10 mg total) by mouth daily., Disp: 30 tablet, Rfl: 0 .  guaiFENesin-codeine (ROBITUSSIN AC) 100-10 MG/5ML syrup, Take 5 mLs by mouth 3 (three) times daily as needed for cough. (Patient not taking: Reported on 05/08/2015), Disp: 120 mL, Rfl: 0 .  metoprolol (LOPRESSOR) 100 MG tablet, Take 100 mg by mouth 2 (two) times daily., Disp: , Rfl:    ROS:                                                                                                                                       History obtained from the patient  General ROS: negative for - chills, fatigue, fever, night sweats, weight gain or weight loss Psychological ROS: negative for - behavioral disorder, hallucinations, memory difficulties, mood swings or suicidal ideation Ophthalmic ROS: negative for - blurry vision, double vision, eye pain or loss of vision ENT ROS: negative for - epistaxis, nasal discharge, oral lesions, sore throat, tinnitus or vertigo Allergy and Immunology ROS: negative for - hives or itchy/watery eyes Hematological and Lymphatic ROS: negative for -  bleeding problems, bruising or swollen lymph nodes Endocrine ROS: negative for - galactorrhea, hair pattern changes, polydipsia/polyuria or temperature intolerance Respiratory ROS: negative for - cough, hemoptysis, shortness of breath or wheezing Cardiovascular ROS: negative for - chest pain, dyspnea on exertion, edema or irregular heartbeat Gastrointestinal ROS: negative for - abdominal pain, diarrhea, hematemesis, nausea/vomiting or stool incontinence Genito-Urinary ROS: negative for - dysuria, hematuria, incontinence or urinary frequency/urgency Musculoskeletal ROS: negative for - joint swelling or muscular weakness Neurological ROS: as noted in HPI Dermatological ROS: negative for rash and skin lesion changes  Neurologic Examination:  Blood pressure 185/175, pulse 78, temperature 98.1 F (36.7 C), temperature source Oral, resp. rate 18, weight 89.812 kg (198 lb), SpO2 97 %.   Neurological Examination Mental Status: Drowsy, arousable to verbal command and tactile stimulus, orientedx4.  Speech mildly dysarthric without definite evidence of aphasia.  Able to follow 2 step commands with some prompting. Cranial Nerves: II: Visual fields grossly appear normal, pupils equal, round, sluggishly reactive to light bilaterally, cataracts noted.  III,IV, VI: right eye does not move in lateral or vertical directions.  Left eye is esotropic and moves to midline but not medially beyond midline.  V,VII: smile grossly appeared symmetric, with some mild flattening of the left nasolabial fold, facial light touch sensation normal bilaterally VIII: hearing appears  normal bilaterally IX,X: uvula rises symmetrically XI: bilateral shoulder shrug XII: midline tongue extension Motor: Right : Upper extremity   5/5    Left:     Upper extremity   2/5 no antigravity strength  Lower extremity   5/5     Lower  extremity   2/5 no antigravity strength  Tone and bulk:normal tone throughout; no atrophy noted Sensory: Pinprick and light touch mildly  decreased on the left Deep Tendon Reflexes: 2+ and symmetric throughout more brisk on the left with no AJ Plantars: Right: downgoing   Left: downgoing Cerebellar: unable to test with left arm , normal finger to nose with the right arm  Gait: deferred    Lab Results: Basic Metabolic Panel:  Recent Labs Lab 05/08/15 1444  NA 138  K 3.9  CL 101  GLUCOSE 85  BUN 12  CREATININE 1.20    Liver Function Tests: No results for input(s): AST, ALT, ALKPHOS, BILITOT, PROT, ALBUMIN in the last 168 hours. No results for input(s): LIPASE, AMYLASE in the last 168 hours. No results for input(s): AMMONIA in the last 168 hours.  CBC:  Recent Labs Lab 05/08/15 1435 05/08/15 1444  WBC 8.7  --   NEUTROABS 6.5  --   HGB 14.2 16.3  HCT 43.7 48.0  MCV 90.9  --   PLT 206  --     Cardiac Enzymes: No results for input(s): CKTOTAL, CKMB, CKMBINDEX, TROPONINI in the last 168 hours.  Lipid Panel: No results for input(s): CHOL, TRIG, HDL, CHOLHDL, VLDL, LDLCALC in the last 168 hours.  CBG:  Recent Labs Lab 05/08/15 1359  GLUCAP 94    Microbiology: Results for orders placed or performed during the hospital encounter of 03/27/11  Culture, blood (routine x 2)     Status: None   Collection Time: 03/27/11 12:40 PM  Result Value Ref Range Status   Specimen Description BLOOD HAND RIGHT  Final   Special Requests BOTTLES DRAWN AEROBIC ONLY 5CC  Final   Culture  Setup Time [id number]  Final   Culture NO GROWTH 5 DAYS  Final   Report Status 04/02/2011 FINAL  Final  Culture, blood (routine x 2)     Status: None   Collection Time: 03/27/11 12:45 PM  Result Value Ref Range Status   Specimen Description BLOOD HAND RIGHT  Final   Special Requests BOTTLES DRAWN AEROBIC ONLY [facility]  Final   Culture  Setup Time [id number]  Final   Culture NO GROWTH 5 DAYS   Final   Report Status 04/02/2011 FINAL  Final  MRSA PCR Screening     Status: None   Collection Time: 03/27/11  4:40 PM  Result Value Ref Range Status   MRSA by PCR NEGATIVE NEGATIVE Final  Comment:        The GeneXpert MRSA Assay (FDA approved for NASAL specimens only), is one component of a comprehensive MRSA colonization surveillance program. It is not intended to diagnose MRSA infection nor to guide or monitor treatment for MRSA infections.    Coagulation Studies:  Recent Labs  05/08/15 1435  LABPROT 14.2  INR 1.08    Imaging: Dg Chest 2 View  05/08/2015  CLINICAL DATA:  Cough for 2 days EXAM: CHEST  2 VIEW COMPARISON:  Chest radiograph and chest CT May 16, 2012 FINDINGS: There is no edema or consolidation. Heart is borderline enlarged with pulmonary vascularity within normal limits. No adenopathy. No bone lesions. IMPRESSION: Mild cardiac enlargement.  No edema or consolidation. Electronically Signed   By: Lowella Grip III M.D.   On: 05/08/2015 14:51   Stroke Risk Factors - hypertension    Assessment: 65 y.o. male with previous  left cerebellar infarct noted on CT brain , poor historian , woke up with symptoms of left-sided weakness about 2 PM , and last known normal was reportedly 5 AM when he went to bed .  He was outside time window of acute IV thrombolytic therapy based on the last known normal at 5 AM.  History of alcohol abuse per EMR review . A stat CT of the head, CT angiogram of the head and neck and CT perfusion studies were done in the ER for acute stroke evaluation.   Ct brain showed Atrophy and chronic ischemic change. No acute intracranial abnormality.  CTA of the head and neck showed 60% diameter stenosis proximal right internal carotid artery. 50% diameter stenosis proximal left internal carotid artery. Moderate stenosis through the carotid siphon bilaterally. Moderate stenosis distal vertebral artery bilaterally. Moderate stenosis mid basilar artery.  Moderate stenosis distal left M1 segment. Mild stenosis right middle cerebral artery branch to the parietal lobe.  CT perfusion study was Negative for any perfusion deficits. In summary, no definite evidence of critical stenosis or occlusions or perfusion deficits were seen on the studies.  Patient continued to have significant left-sided weakness which is unexplained with the negative studies. Given the history of alcohol abuse, with blood alcohol level being negative, suspect a possible alcohol withdrawal seizure causing a Todd's paralysis on the left side. His been empirically treated with Keppra 1 g initial dose in the ER and ordered Keppra 500 twice a day maintenance dose. Further neurodiagnostic evaluation with MRI of the brain without contrast and an EEG have been ordered. He'll be admitted to hospitalist service, with close neurological monitoring and seizure precautions.  Neurology service will continue to follow-up. please call for any further questions.  Case discussed with ER physician, Dr. Thomasene Lot.       Addendum:  MRI brain completed. Showed a right parasagittal pontine infarct involving the right corticospinal tract,  which explains left hemiplegia.  Recommend further stroke workup including echocardiogram with bubble study, and Holter monitoring to rule out paroxysmal A. Fib.  Aspirin has been switched to Plavix 75 mg daily.  No antiplatelet agents are listed in his home medications.  At this time will discontinue Keppra. Nevertheless, given his risk for seizures based on alcohol  Abuse history,  EEG has been ordered.   stroke service will continue to follow the patient.

## 2015-05-08 NOTE — ED Notes (Signed)
Neurology at the bedside

## 2015-05-08 NOTE — Progress Notes (Deleted)
Walked with patient one assist and a walker to the door and to the bathroom and back to bed. Patient tolerated it well. Pain meds given beforehand. Will continue to monitor. Garrison Michie, Rande Brunt, RN

## 2015-05-08 NOTE — Progress Notes (Signed)
Pt received at 17:40pm alert, verbal with no noted distress. Pt stable, denying pain or discomfort. Pt oriented to room. Safety measures in place. Telemetry monitoring. Call bell within reach. Will continue to monitor.

## 2015-05-08 NOTE — ED Notes (Signed)
Pt to department via EMS- pt was LSN about 2 days ago. Roommate reports that last night they noticed slurred speech and weakness on the left arm and leg. Pt with hx of CVA and deficits to the left side but is now worse. Nausea and vomiting with EMS. 20 g left hand 4mg  Zofran. Bp-141/86 Cbg-106 HR-76

## 2015-05-09 ENCOUNTER — Observation Stay (HOSPITAL_BASED_OUTPATIENT_CLINIC_OR_DEPARTMENT_OTHER): Payer: Medicare Other

## 2015-05-09 ENCOUNTER — Observation Stay (HOSPITAL_COMMUNITY)
Admit: 2015-05-09 | Discharge: 2015-05-09 | Disposition: A | Discharge status: Home / Self Care | Payer: Medicare Other | Attending: Neurology | Admitting: Neurology

## 2015-05-09 DIAGNOSIS — I10 Essential (primary) hypertension: Secondary | ICD-10-CM

## 2015-05-09 DIAGNOSIS — I6930 Unspecified sequelae of cerebral infarction: Secondary | ICD-10-CM

## 2015-05-09 DIAGNOSIS — I6789 Other cerebrovascular disease: Secondary | ICD-10-CM | POA: Diagnosis not present

## 2015-05-09 DIAGNOSIS — R350 Frequency of micturition: Secondary | ICD-10-CM | POA: Diagnosis not present

## 2015-05-09 DIAGNOSIS — I63331 Cerebral infarction due to thrombosis of right posterior cerebral artery: Secondary | ICD-10-CM | POA: Diagnosis not present

## 2015-05-09 DIAGNOSIS — Z8249 Family history of ischemic heart disease and other diseases of the circulatory system: Secondary | ICD-10-CM | POA: Diagnosis not present

## 2015-05-09 DIAGNOSIS — I651 Occlusion and stenosis of basilar artery: Secondary | ICD-10-CM | POA: Diagnosis present

## 2015-05-09 DIAGNOSIS — I69398 Other sequelae of cerebral infarction: Secondary | ICD-10-CM | POA: Diagnosis not present

## 2015-05-09 DIAGNOSIS — I739 Peripheral vascular disease, unspecified: Secondary | ICD-10-CM | POA: Diagnosis present

## 2015-05-09 DIAGNOSIS — F1410 Cocaine abuse, uncomplicated: Secondary | ICD-10-CM | POA: Diagnosis present

## 2015-05-09 DIAGNOSIS — E785 Hyperlipidemia, unspecified: Secondary | ICD-10-CM | POA: Insufficient documentation

## 2015-05-09 DIAGNOSIS — I69991 Dysphagia following unspecified cerebrovascular disease: Secondary | ICD-10-CM

## 2015-05-09 DIAGNOSIS — I639 Cerebral infarction, unspecified: Secondary | ICD-10-CM | POA: Diagnosis not present

## 2015-05-09 DIAGNOSIS — F17200 Nicotine dependence, unspecified, uncomplicated: Secondary | ICD-10-CM | POA: Diagnosis present

## 2015-05-09 DIAGNOSIS — F1020 Alcohol dependence, uncomplicated: Secondary | ICD-10-CM | POA: Diagnosis present

## 2015-05-09 DIAGNOSIS — I6523 Occlusion and stenosis of bilateral carotid arteries: Secondary | ICD-10-CM | POA: Diagnosis present

## 2015-05-09 DIAGNOSIS — F12129 Cannabis abuse with intoxication, unspecified: Secondary | ICD-10-CM | POA: Diagnosis present

## 2015-05-09 DIAGNOSIS — M6289 Other specified disorders of muscle: Secondary | ICD-10-CM | POA: Diagnosis not present

## 2015-05-09 DIAGNOSIS — R471 Dysarthria and anarthria: Secondary | ICD-10-CM | POA: Diagnosis present

## 2015-05-09 DIAGNOSIS — I6302 Cerebral infarction due to thrombosis of basilar artery: Secondary | ICD-10-CM

## 2015-05-09 DIAGNOSIS — H539 Unspecified visual disturbance: Secondary | ICD-10-CM | POA: Diagnosis not present

## 2015-05-09 DIAGNOSIS — F1910 Other psychoactive substance abuse, uncomplicated: Secondary | ICD-10-CM | POA: Diagnosis not present

## 2015-05-09 DIAGNOSIS — J069 Acute upper respiratory infection, unspecified: Secondary | ICD-10-CM | POA: Diagnosis present

## 2015-05-09 DIAGNOSIS — Z8673 Personal history of transient ischemic attack (TIA), and cerebral infarction without residual deficits: Secondary | ICD-10-CM | POA: Diagnosis present

## 2015-05-09 DIAGNOSIS — H532 Diplopia: Secondary | ICD-10-CM | POA: Diagnosis present

## 2015-05-09 DIAGNOSIS — I69354 Hemiplegia and hemiparesis following cerebral infarction affecting left non-dominant side: Secondary | ICD-10-CM | POA: Diagnosis not present

## 2015-05-09 DIAGNOSIS — Z79899 Other long term (current) drug therapy: Secondary | ICD-10-CM | POA: Diagnosis not present

## 2015-05-09 DIAGNOSIS — G8194 Hemiplegia, unspecified affecting left nondominant side: Secondary | ICD-10-CM | POA: Diagnosis not present

## 2015-05-09 DIAGNOSIS — F1010 Alcohol abuse, uncomplicated: Secondary | ICD-10-CM | POA: Diagnosis present

## 2015-05-09 HISTORY — DX: Essential (primary) hypertension: I10

## 2015-05-09 LAB — CBC
HCT: 40.8 % (ref 39.0–52.0)
HEMOGLOBIN: 13.1 g/dL (ref 13.0–17.0)
MCH: 29.4 pg (ref 26.0–34.0)
MCHC: 32.1 g/dL (ref 30.0–36.0)
MCV: 91.5 fL (ref 78.0–100.0)
PLATELETS: 203 10*3/uL (ref 150–400)
RBC: 4.46 MIL/uL (ref 4.22–5.81)
RDW: 12.5 % (ref 11.5–15.5)
WBC: 6.9 10*3/uL (ref 4.0–10.5)

## 2015-05-09 LAB — LIPID PANEL
CHOL/HDL RATIO: 3 ratio
CHOLESTEROL: 137 mg/dL (ref 0–200)
HDL: 45 mg/dL (ref >40)
LDL CALC: 52 mg/dL (ref 0–99)
Triglycerides: 198 mg/dL — ABNORMAL HIGH (ref <150)
VLDL: 40 mg/dL (ref 0–40)

## 2015-05-09 LAB — BASIC METABOLIC PANEL
ANION GAP: 10 (ref 5–15)
BUN: 10 mg/dL (ref 6–20)
CALCIUM: 8.9 mg/dL (ref 8.9–10.3)
CO2: 25 mmol/L (ref 22–32)
CREATININE: 1.14 mg/dL (ref 0.61–1.24)
Chloride: 105 mmol/L (ref 101–111)
GFR calc Af Amer: >60 mL/min (ref >60)
GLUCOSE: 70 mg/dL (ref 65–99)
Potassium: 3.9 mmol/L (ref 3.5–5.1)
Sodium: 140 mmol/L (ref 135–145)

## 2015-05-09 MED ORDER — ASPIRIN 325 MG PO TABS
325.0000 mg | ORAL_TABLET | Freq: Every day | ORAL | Status: DC
  Administered 2015-05-09 – 2015-05-11 (×3): 325 mg via ORAL
  Filled 2015-05-09 (×3): qty 1

## 2015-05-09 MED ORDER — CLOPIDOGREL BISULFATE 75 MG PO TABS
75.0000 mg | ORAL_TABLET | Freq: Every day | ORAL | Status: DC
  Administered 2015-05-09 – 2015-05-11 (×3): 75 mg via ORAL
  Filled 2015-05-09 (×3): qty 1

## 2015-05-09 NOTE — Progress Notes (Signed)
IP Rehab consult has been ordered and recommendations are pending.  Admissions coordinator will follow up as appropriate once consult is completed.  [place] Admissions Coordinator Cell [phone] Office [phone]

## 2015-05-09 NOTE — Care Management Note (Signed)
Case Management Note  Patient Details  Name: Andre [name] MRN: [id number] Date of Birth: Aug 27, 1950  Subjective/Objective:   Patient admitted with CVA. Also positive for cocaine. No PCP listed. Patient is from home with his wife.                  Action/Plan: Recommendations are for CIR. Await CIR review. CM will continue to follow for discharge needs.   Expected Discharge Date:                  Expected Discharge Plan:     In-House Referral:     Discharge planning Services     Post Acute Care Choice:    Choice offered to:     DME Arranged:    DME Agency:     HH Arranged:    HH Agency:     Status of Service:  In process, will continue to follow  Medicare Important Message Given:    Date Medicare IM Given:    Medicare IM give by:    Date Additional Medicare IM Given:    Additional Medicare Important Message give by:     If discussed at [place] of Stay Meetings, dates discussed:    Additional Comments:  Pollie Friar, RN 05/09/2015, 3:10 PM

## 2015-05-09 NOTE — Progress Notes (Signed)
Occupational Therapy Evaluation Patient Details Name: Andre [name] MRN: [id number] DOB: 1951-02-03 Today's Date: 05/09/2015    History of Present Illness 65 y.o. male admitted with left sided weakness.Acute ischemic stroke, right pons and medulla with history of multiple previous strokes: Workup underway. H/o polysubstance abuse.   Clinical Impression   Pt admitted with the above diagnoses and presents with below problem list. Pt will benefit from continued acute OT to address the below listed deficits and maximize independence with BADLs prior to d/c to venue below. PTA pt was mod I with ADLs. Pt presents with left side weakness and impaired sensation and acute vision impairments impacting current level of assist with ADLs; dysarthia also noted. Pt is currently mod-max A with most ADLs, max A for SPT, and likely +2 for functional mobility. Session limited by dizziness in sitting and standing. Session details below. Pt will greatly benefit from continued therapy in skilled care setting at d/c. Discussed this recommendation with pt appear to be in agreement. OT to continue to follow acutely.      Follow Up Recommendations  CIR    Equipment Recommendations  Other (comment) (defer)    Recommendations for Other Services Rehab consult;PT consult     Precautions / Restrictions Precautions Precautions: Fall Restrictions Weight Bearing Restrictions: No      Mobility Bed Mobility               General bed mobility comments: in recliner  Transfers Overall transfer level: Needs assistance   Transfers: Sit to/from Stand Sit to Stand: Mod assist         General transfer comment: Mod A to powerup to standing from recliner. Cues for technique. Therapist providing block to left knee.    Balance Overall balance assessment: Needs assistance Sitting-balance support: No upper extremity supported Sitting balance-[name]: Fair Sitting balance - Comments: in unsupported  seating briefly in preparation to stand, c/o dizziness   Standing balance support: Single extremity supported;Bilateral upper extremity supported;During functional activity                                ADL Overall ADL's : Needs assistance/impaired Eating/Feeding: Set up;Sitting   Grooming: Moderate assistance;Sitting   Upper Body Bathing: Moderate assistance;Sitting   Lower Body Bathing: Maximal assistance;Sit to/from stand   Upper Body Dressing : Moderate assistance;Sitting   Lower Body Dressing: Maximal assistance;Sit to/from stand   Toilet Transfer: Maximal assistance;Stand-pivot;BSC   Toileting- Clothing Manipulation and Hygiene: Maximal assistance;Sit to/from stand   Tub/ Shower Transfer: Maximal assistance;Stand-pivot;3 in 1     General ADL Comments: Pt completed sit<>stand from recliner with mod-max A. Pt stood for about 1 minute. Dizziness reported as pt leaned forward in preparation to stand and lasted until pt sat back in recliner again.      Vision Vision Assessment?: Yes;Vision impaired- to be further tested in functional context Eye Alignment: Impaired (comment) Ocular Range of Motion: Other (comment) (impaired left eye) Alignment/Gaze Preference: Gaze left;Other (comment) (even with patch on left eye) Tracking/Visual Pursuits: Left eye does not track medially;Left eye does not track laterally;Requires cues, head turns, or add eye shifts to track;Impaired - to be further tested in functional context Saccades: Impaired - to be further tested in functional context Convergence: Impaired - to be further tested in functional context Visual Fields: Left visual field deficit;Right visual field deficit;Impaired-to be further tested in functional context Diplopia Assessment: Disappears with one eye closed  Depth Perception: Undershoots Additional Comments: Pt with eye patch on left eye at start of session. Educated pt on rotating eye patch throughout the day  every 1-2 hours or with the start of a new TV program. Pt unable to hold both eyes open long without eye patch occulsion due to diplopia. Exotropia noted in left eye which pt reports is not baseline. difficulty with right eye peripheral vision in superior and inferior fields noted. Left eye able to come to midline with cues, unable to track past midline to right. Educated pt on ROM exercise for left eye. Pt noted to need cues to "find my nose" with gaze tending to stop towards left with pt reporting that he was looking at therapist's nose.   Perception     Praxis      Pertinent Vitals/Pain Pain Assessment: No/denies pain     Hand Dominance Right   Extremity/Trunk Assessment Upper Extremity Assessment Upper Extremity Assessment: LUE deficits/detail LUE Deficits / Details: Grossly 2-/5 although stronger distal than proximal. 3/5 distal with weak grasp noted.  LUE Sensation: decreased light touch LUE Coordination: decreased fine motor;decreased gross motor   Lower Extremity Assessment Lower Extremity Assessment: Defer to PT evaluation       Communication Communication Communication: Expressive difficulties;Other (comment) (dyarthia noted)   Cognition Arousal/Alertness: Awake/alert Behavior During Therapy: WFL for tasks assessed/performed Overall Cognitive Status: Within Functional Limits for tasks assessed                     General Comments    Pt's significant other feeding pt his lunch upon therpist's arrival. When asked if pt is able to do himself pt replied yes.    Exercises Exercises: Other exercises Other Exercises Other Exercises: Educated on AAROM exercises for LUE using RUE to support. Pt giving good return demonstration and no c/o pain.   Shoulder Instructions      Home Living Family/patient expects to be discharged to:: Private residence Living Arrangements: Spouse/significant other   Type of Home: Apartment Home Access: Elevator     Home Layout: One  level     Bathroom Shower/Tub: Tub/shower unit         Home Equipment: Kasandra Knudsen - single point      Lives With: Significant other;Other (Comment) (20 years per significant other's report with pt present)    Prior Functioning/Environment Level of Independence: Independent with assistive device(s)        Comments: cane for ambulation, no assist ADLs    OT Diagnosis: Hemiplegia non-dominant side;Disturbance of vision   OT Problem List: Decreased strength;Decreased range of motion;Decreased activity tolerance;Impaired balance (sitting and/or standing);Impaired vision/perception;Decreased coordination;Decreased knowledge of use of DME or AE;Decreased knowledge of precautions;Impaired sensation;Impaired tone;Impaired UE functional use   OT Treatment/Interventions: Self-care/ADL training;Therapeutic exercise;Neuromuscular education;DME and/or AE instruction;Therapeutic activities;Visual/perceptual remediation/compensation;Patient/family education;Balance training    OT Goals(Current goals can be found in the care plan section) Acute Rehab OT Goals Patient Stated Goal: not verbalized, indicated agreeable to contiued therapy in skilled setting OT Goal Formulation: With patient/family Time For Goal Achievement: 05/23/15 Potential to Achieve Goals: Good ADL Goals Pt Will Perform Grooming: with modified independence;sitting Pt Will Perform Upper Body Bathing: with modified independence;sitting Pt Will Perform Lower Body Bathing: with min assist;sit to/from stand;with adaptive equipment Pt Will Perform Upper Body Dressing: with modified independence;sitting Pt Will Perform Lower Body Dressing: with min assist;with adaptive equipment;sit to/from stand Pt Will Transfer to Toilet: with mod assist;stand pivot transfer;bedside commode Pt Will Perform Toileting - Clothing  Manipulation and hygiene: with min assist;with adaptive equipment;sit to/from stand Pt/caregiver will Perform Home Exercise  Program: Increased ROM;Left upper extremity;With written HEP provided Additional ADL Goal #1: Pt will be independent with eye patch protocol.  Additional ADL Goal #2: Pt will be mod I with vision exercises.  OT Frequency: Min 3X/week   Barriers to D/C:            Co-evaluation              End of Session Equipment Utilized During Treatment: Gait belt;Rolling walker Nurse Communication: Other (comment) (dizzy in sit/stand; eye patch protocol)  Activity Tolerance: Other (comment) (dizzy in sitting/standing) Patient left: in chair;with call bell/phone within reach;with family/visitor present   Time: 1220-1300 OT Time Calculation (min): 40 min Charges:  OT General Charges $OT Visit: 1 Procedure OT Evaluation $OT Eval Moderate Complexity: 1 Procedure OT Treatments $Self Care/Home Management : 8-22 mins G-Codes: OT G-codes **NOT FOR INPATIENT CLASS** Functional Assessment Tool Used: clinical judgement Functional Limitation: Self care Self Care Current Status [id number]): At least 60 percent but less than 80 percent impaired, limited or restricted Self Care Goal Status [id number]): At least 40 percent but less than 60 percent impaired, limited or restricted  Hortencia Pilar 05/09/2015, 1:41 PM

## 2015-05-09 NOTE — Evaluation (Signed)
Physical Therapy Evaluation Patient Details Name: Andre [name] MRN: [id number] DOB: 1950-08-11 Today's Date: 05/09/2015   History of Present Illness  65 y.o. male admitted with left sided weakness.Acute ischemic stroke, right pons and medulla with history of multiple previous strokes: Workup underway. H/o polysubstance abuse.  Clinical Impression  Pt admitted with above diagnosis. Pt currently with functional limitations due to the deficits listed below (see PT Problem List). At the time of PT eval pt was able to perform transfers and ambulation with mod assist and use of RW. Will need +2 to progress gait training. Feel this pt would benefit from continued therapy at the CIR level to improve safety and independence with mobility prior to return home with wife.    Follow Up Recommendations CIR;Supervision/Assistance - 24 hour    Equipment Recommendations  Rolling walker with 5" wheels;3in1 (PT)    Recommendations for Other Services Rehab consult     Precautions / Restrictions Precautions Precautions: Fall Restrictions Weight Bearing Restrictions: No      Mobility  Bed Mobility Overal bed mobility: Needs Assistance Bed Mobility: Supine to Sit     Supine to sit: Mod assist     General bed mobility comments: Assist to elevate trunk to full sitting position and scoot so feet were resting on floor. Pt using RLE to move LLE off EOB.   Transfers Overall transfer level: Needs assistance Equipment used: Rolling walker (2 wheeled) Transfers: Sit to/from Stand Sit to Stand: Mod assist         General transfer comment: Assist to power-up to full standing from low EOB. Assist to place LUE on the walker and pt pushed up from bed on the R side.   Ambulation/Gait             General Gait Details: Unable at this time. Will need +2 to progress to gait training.  Stairs            Wheelchair Mobility    Modified Rankin (Stroke Patients Only)       Balance  Overall balance assessment: Needs assistance Sitting-balance support: No upper extremity supported Sitting balance-[name]: Fair Sitting balance - Comments: in unsupported seating briefly in preparation to stand, c/o dizziness   Standing balance support: Bilateral upper extremity supported Standing balance-[name]: Poor Standing balance comment: Requires UE support to maintain standing balance.                              Pertinent Vitals/Pain Pain Assessment: No/denies pain    Home Living Family/patient expects to be discharged to:: Private residence Living Arrangements: Spouse/significant other   Type of Home: Apartment Home Access: Elevator     Home Layout: One level Home Equipment: Cane - single point      Prior Function Level of Independence: Independent with assistive device(s)         Comments: cane for ambulation, no assist ADLs     Hand Dominance   Dominant Hand: Right    Extremity/Trunk Assessment   Upper Extremity Assessment: Defer to OT evaluation       LUE Deficits / Details: Grossly 2-/5 although stronger distal than proximal. 3/5 distal with weak grasp noted.    Lower Extremity Assessment: LLE deficits/detail   LLE Deficits / Details: Grossly <3/5 strength in LLE. Pt mostly using RLE tucked underneath L ankle to move the LLE.  Cervical / Trunk Assessment: Normal  Communication   Communication: Expressive difficulties;Other (  comment) (dyarthia noted)  Cognition Arousal/Alertness: Awake/alert Behavior During Therapy: WFL for tasks assessed/performed Overall Cognitive Status: Within Functional Limits for tasks assessed                      General Comments General comments (skin integrity, edema, etc.): Pt's significant other feeding pt his lunch upon therpist's arrival. When asked if pt is able to do himself pt replied yes.     Exercises Other Exercises Other Exercises: Educated on AAROM exercises for LUE using  RUE to support. Pt giving good return demonstration and no c/o pain.      Assessment/Plan    PT Assessment Patient needs continued PT services  PT Diagnosis Difficulty walking;Hemiplegia non-dominant side   PT Problem List Decreased strength;Decreased range of motion;Decreased activity tolerance;Decreased balance;Decreased mobility;Decreased knowledge of use of DME;Decreased safety awareness;Decreased knowledge of precautions;Decreased coordination;Impaired tone  PT Treatment Interventions Gait training;DME instruction;Stair training;Functional mobility training;Therapeutic activities;Therapeutic exercise;Neuromuscular re-education;Patient/family education   PT Goals (Current goals can be found in the Care Plan section) Acute Rehab PT Goals Patient Stated Goal: Get stronger PT Goal Formulation: With patient/family Time For Goal Achievement: 05/23/15 Potential to Achieve Goals: Good    Frequency Min 4X/week   Barriers to discharge        Co-evaluation               End of Session Equipment Utilized During Treatment: Gait belt Activity Tolerance: Patient limited by fatigue Patient left: in chair;with call bell/phone within reach;with family/visitor present Nurse Communication: Mobility status    Functional Assessment Tool Used: Clinical judgement Functional Limitation: Mobility: Walking and moving around Mobility: Walking and Moving Around Current Status [phone]): At least 20 percent but less than 40 percent impaired, limited or restricted Mobility: Walking and Moving Around Goal Status [phone]): At least 20 percent but less than 40 percent impaired, limited or restricted    Time: 1144-1159 PT Time Calculation (min) (ACUTE ONLY): 15 min   Charges:   PT Evaluation $PT Eval Moderate Complexity: 1 Procedure     PT G Codes:   PT G-Codes **NOT FOR INPATIENT CLASS** Functional Assessment Tool Used: Clinical judgement Functional Limitation: Mobility: Walking and moving  around Mobility: Walking and Moving Around Current Status [phone]): At least 20 percent but less than 40 percent impaired, limited or restricted Mobility: Walking and Moving Around Goal Status [phone]): At least 20 percent but less than 40 percent impaired, limited or restricted    Rolinda Roan 05/09/2015, 1:50 PM  Rolinda Roan, PT, DPT Acute Rehabilitation Services Pager: [phone]

## 2015-05-09 NOTE — Consult Note (Signed)
Physical Medicine and Rehabilitation Consult  Reason for Consult:  Left sided weakness, visual deficits and left facial droop Referring Physician: Dr. Janann Colonel   HPI: Andre [name] is a 65 y.o. male with history of CVA with mild LLE weakness, polysubstance abuse who was admitted on 05/08/15 with left sided weakness and left facial droop. Patient had been out drinking with friends the night before and woke up the next day with worsening of left sided weakness. UDS positive for cocaine. CT head negative and CTA head/neck showed 60% stenosis proximal R-ICA and 50% stenosis L-ICA, moderate stenosis distal B-Va and mid basilar artery. MRI brain done revealing acute non-hemorrhagic infarct in right pons and medulla. 2 D echo with EF 50-55% and no wall abnormality. EEG negative for seizure/normal study.  [name] consulted and felt that stroke due to small vessel disease and work up underway. ST evaluation with moderate mixed dysarthria and mild oral dysphagia---on dysphagia 3, thin liquids.  Patient with resultant left sided weakness with sensory deficits, visual deficits with diplopia, and dizziness affecting ability to carry out self care tasks as well as mobility.   Review of Systems  HENT: Negative for hearing loss.   Eyes: Positive for blurred vision and double vision.  Respiratory: Positive for cough (recent cold). Negative for shortness of breath and wheezing.   Cardiovascular: Negative for chest pain and palpitations.  Gastrointestinal: Negative for heartburn, nausea and abdominal pain.  Genitourinary: Negative for dysuria.  Musculoskeletal: Negative for myalgias.  Neurological: Positive for sensory change, focal weakness and weakness. Negative for dizziness and tingling.  Psychiatric/Behavioral: The patient does not have insomnia.   All other systems reviewed and are negative.  Past Medical History  Diagnosis Date  . Hypertension   . Stroke [facility])     Past Surgical History    Procedure Laterality Date  . Abdominal surgery      Family History  Problem Relation Age of Onset  . Hypertension Mother   . Hypertension Father     Social History:  Widowed-lives with girlfriend who has health issues but can provide some supervision after discharge.  Disabled since [place] 2012. Independent with cane PTA. He reports that he has been smoking--about 5 cigarettes daily.  He does not have any smokeless tobacco history on file. He reports that he drinks beer daily--4 40 ounces over New Years day. He denies illicit drug use.    Allergies: No Known Allergies   Medications Prior to Admission  Medication Sig Dispense Refill  . lisinopril (PRINIVIL,ZESTRIL) 10 MG tablet Take 10 mg by mouth daily.  0  . Multiple Vitamins-Minerals (MULTIVITAMINS THER. W/MINERALS) TABS Take 1 tablet by mouth daily. 30 each 0  . simvastatin (ZOCOR) 20 MG tablet Take 20 mg by mouth daily.  0  . amLODipine (NORVASC) 10 MG tablet Take 1 tablet (10 mg total) by mouth daily. 30 tablet 0  . guaiFENesin-codeine (ROBITUSSIN AC) 100-10 MG/5ML syrup Take 5 mLs by mouth 3 (three) times daily as needed for cough. (Patient not taking: Reported on 05/08/2015) 120 mL 0  . metoprolol (LOPRESSOR) 100 MG tablet Take 100 mg by mouth 2 (two) times daily.      Home: Home Living Family/patient expects to be discharged to:: Private residence Living Arrangements: Spouse/significant other Type of Home: Apartment Home Access: [place]: One level Bathroom Shower/Tub: Tub/shower unit Home Equipment: Kasandra Knudsen - single point  Lives With: Significant other, Other (Comment) (20 years per significant other's report with pt  present)  Functional History: Prior Function Level of Independence: Independent with assistive device(s) Comments: cane for ambulation, no assist ADLs Functional Status:  Mobility: Bed Mobility Overal bed mobility: Needs Assistance Bed Mobility: Supine to Sit Supine to sit: Mod assist General  bed mobility comments: Assist to elevate trunk to full sitting position and scoot so feet were resting on floor. Pt using RLE to move LLE off EOB.  Transfers Overall transfer level: Needs assistance Equipment used: Rolling walker (2 wheeled) Transfers: Sit to/from Stand Sit to Stand: Mod assist General transfer comment: Assist to power-up to full standing from low EOB. Assist to place LUE on the walker and pt pushed up from bed on the R side.  Ambulation/Gait General Gait Details: Unable at this time. Will need +2 to progress to gait training.    ADL: ADL Overall ADL's : Needs assistance/impaired Eating/Feeding: Set up, Sitting Grooming: Moderate assistance, Sitting Upper Body Bathing: Moderate assistance, Sitting Lower Body Bathing: Maximal assistance, Sit to/from stand Upper Body Dressing : Moderate assistance, Sitting Lower Body Dressing: Maximal assistance, Sit to/from stand Toilet Transfer: Maximal assistance, Stand-pivot, BSC Toileting- Clothing Manipulation and Hygiene: Maximal assistance, Sit to/from stand Tub/ Shower Transfer: Maximal assistance, Stand-pivot, 3 in 1 General ADL Comments: Pt completed sit<>stand from recliner with mod-max A. Pt stood for about 1 minute. Dizziness reported as pt leaned forward in preparation to stand and lasted until pt sat back in recliner again.   Cognition: Cognition Overall Cognitive Status: Within Functional Limits for tasks assessed Arousal/Alertness: Awake/alert Orientation Level: Oriented X4 Attention: Sustained, Selective Sustained Attention: Appears intact Selective Attention: Appears intact Memory: Impaired Memory Impairment: Retrieval deficit (pt recalled 3/5 words independently within 5 minutes, 2 with cues ) Awareness: Appears intact (aware to need to call for assist to prevent falls due to left sided weakness) Safety/Judgment: Appears intact Comments: abstract thinking deficits noted with pt able to identify basic  similarities between items eg: food vs fruit, noise maker vs musical instruments Cognition Arousal/Alertness: Awake/alert Behavior During Therapy: WFL for tasks assessed/performed Overall Cognitive Status: Within Functional Limits for tasks assessed   Blood pressure 137/85, pulse 71, temperature 98.4 F (36.9 C), temperature source Oral, resp. rate 20, height 5\' 6"  (1.676 m), weight 77.474 kg (170 lb 12.8 oz), SpO2 97 %. Physical Exam  Nursing note and vitals reviewed. Constitutional: He is oriented to person, place, and time. He appears well-nourished.  HENT:  Head: Normocephalic and atraumatic.  Eyes: Right conjunctiva is injected. Left conjunctiva is injected. Right eye exhibits abnormal extraocular motion. Left eye exhibits abnormal extraocular motion.  Neck: Normal range of motion. Neck supple.  Cardiovascular: Normal rate and regular rhythm.   No murmur heard. Respiratory: Effort normal and breath sounds normal. No respiratory distress. He exhibits no tenderness.  GI: Soft. Bowel sounds are normal. He exhibits no distension. There is no tenderness.  Musculoskeletal: He exhibits no edema or tenderness.  Neurological: He is alert and oriented to person, place, and time.  Soft voice with mild dysarthria.  Right inattention.  Left eye abducted and unable to turn right eye laterally.  ?tongue deviation to left Occasional wet voice.  Able to follow basic one and two step motor commands. Motor RUE/RLE 4+-5/5.  LUE: 3+/5 proximal to distal, apraxia LLE: 1/5 hip flexion, 3/5 ankle dorsi/plantar flexion Sensation diminished to light touch LUE/LLE 3+ DTR LUE Left hemiparesis with component of apraxia. Left sensory deficits.   Skin: Skin is warm and dry.  Psychiatric: He has a normal mood and  affect. His behavior is normal.    Results for orders placed or performed during the hospital encounter of 05/08/15 (from the past 24 hour(s))  Ammonia     Status: Abnormal   Collection Time:  05/08/15  4:04 PM  Result Value Ref Range   Ammonia 37 (H) 9 - 35 umol/L  TSH     Status: None   Collection Time: 05/08/15  4:04 PM  Result Value Ref Range   TSH 0.495 0.350 - 4.500 uIU/mL  Ethanol     Status: None   Collection Time: 05/08/15  4:04 PM  Result Value Ref Range   Alcohol, Ethyl (B) <5 <5 mg/dL  Culture, blood (routine x 2)     Status: None (Preliminary result)   Collection Time: 05/08/15  4:04 PM  Result Value Ref Range   Specimen Description BLOOD RIGHT FOREARM    Special Requests BOTTLES DRAWN AEROBIC AND ANAEROBIC 10CC    Culture NO GROWTH < 24 HOURS    Report Status PENDING   Culture, blood (routine x 2)     Status: None (Preliminary result)   Collection Time: 05/08/15  4:11 PM  Result Value Ref Range   Specimen Description BLOOD RIGHT HAND    Special Requests BOTTLES DRAWN AEROBIC AND ANAEROBIC 10CC    Culture NO GROWTH < 24 HOURS    Report Status PENDING   Urine rapid drug screen (hosp performed)not at [facility]     Status: Abnormal   Collection Time: 05/08/15  4:29 PM  Result Value Ref Range   Opiates NONE DETECTED NONE DETECTED   Cocaine POSITIVE (A) NONE DETECTED   Benzodiazepines NONE DETECTED NONE DETECTED   Amphetamines NONE DETECTED NONE DETECTED   Tetrahydrocannabinol NONE DETECTED NONE DETECTED   Barbiturates NONE DETECTED NONE DETECTED  Urinalysis, Routine w reflex microscopic (not at [facility])     Status: Abnormal   Collection Time: 05/08/15  4:30 PM  Result Value Ref Range   Color, Urine YELLOW YELLOW   APPearance CLEAR CLEAR   Specific Gravity, Urine >1.046 (H) 1.005 - 1.030   pH 5.0 5.0 - 8.0   Glucose, UA NEGATIVE NEGATIVE mg/dL   Hgb urine dipstick NEGATIVE NEGATIVE   Bilirubin Urine NEGATIVE NEGATIVE   Ketones, ur 15 (A) NEGATIVE mg/dL   Protein, ur NEGATIVE NEGATIVE mg/dL   Nitrite NEGATIVE NEGATIVE   Leukocytes, UA NEGATIVE NEGATIVE  Lipid panel     Status: Abnormal   Collection Time: 05/09/15  5:46 AM  Result Value Ref Range    Cholesterol 137 0 - 200 mg/dL   Triglycerides 198 (H) <150 mg/dL   HDL 45 >40 mg/dL   Total CHOL/HDL Ratio 3.0 RATIO   VLDL 40 0 - 40 mg/dL   LDL Cholesterol 52 0 - 99 mg/dL  Basic metabolic panel     Status: None   Collection Time: 05/09/15  5:46 AM  Result Value Ref Range   Sodium 140 135 - 145 mmol/L   Potassium 3.9 3.5 - 5.1 mmol/L   Chloride 105 101 - 111 mmol/L   CO2 25 22 - 32 mmol/L   Glucose, Bld 70 65 - 99 mg/dL   BUN 10 6 - 20 mg/dL   Creatinine, Ser 1.14 0.61 - 1.24 mg/dL   Calcium 8.9 8.9 - 10.3 mg/dL   GFR calc non Af Amer >60 >60 mL/min   GFR calc Af Amer >60 >60 mL/min   Anion gap 10 5 - 15  CBC     Status: None  Collection Time: 05/09/15  5:46 AM  Result Value Ref Range   WBC 6.9 4.0 - 10.5 K/uL   RBC 4.46 4.22 - 5.81 MIL/uL   Hemoglobin 13.1 13.0 - 17.0 g/dL   HCT 40.8 39.0 - 52.0 %   MCV 91.5 78.0 - 100.0 fL   MCH 29.4 26.0 - 34.0 pg   MCHC 32.1 30.0 - 36.0 g/dL   RDW 12.5 11.5 - 15.5 %   Platelets 203 150 - 400 K/uL   Ct Angio Head W/cm &/or Wo Cm  05/08/2015  CLINICAL DATA:  Left-sided weakness EXAM: CT ANGIOGRAPHY HEAD AND NECK TECHNIQUE: Multidetector CT imaging of the head and neck was performed using the standard protocol during bolus administration of intravenous contrast. Multiplanar CT image reconstructions and MIPs were obtained to evaluate the vascular anatomy. Carotid stenosis measurements (when applicable) are obtained utilizing NASCET criteria, using the distal internal carotid diameter as the denominator. CONTRAST:  169mL OMNIPAQUE IOHEXOL 350 MG/ML SOLN COMPARISON:  CT head today and 03/28/2011 FINDINGS: CTA NECK Aortic arch: Proximal great vessels patent with mild atherosclerotic calcification at the origin of the innominate artery. Mild apical emphysema bilaterally. Right carotid system: Right common carotid artery widely patent. Calcified plaque in the right carotid bulb. 60% diameter stenosis right internal carotid artery. Right external  carotid artery widely patent. Remainder of the right internal carotid artery widely patent. Left carotid system: Left common carotid artery widely patent. Atherosclerotic calcified plaque at the carotid bulb on the left with 50% diameter stenosis. Left external carotid artery widely patent. Left internal carotid artery widely patent. Vertebral arteries:Both vertebral arteries patent to the basilar. No proximal stenosis. Moderate stenosis distal vertebral artery bilaterally due to calcific plaque. Skeleton: Moderate to advanced cervical spondylosis. No fracture or bony lesion. Other neck: Multiple subcutaneous cysts are present in the face bilaterally. These are mostly fluid filled however several also contain gas. 15 mm submandibular nodule on the right contains gas. Larger nodule anterior to the left parotid gland measures 26 x 17 mm and contains gas. Surrounding subcutaneous fat does not show significant stranding. CTA HEAD Anterior circulation: Atherosclerotic calcification in the cavernous carotid bilaterally with moderate stenosis bilaterally, left greater than right. Moderate stenosis distal left M1 segment. Hypoplastic left A1 segment. Both anterior cerebral arteries supplied from the right and are patent. Right middle cerebral artery is patent with mild stenosis of the parietal branch of the left middle cerebral artery. Posterior circulation: Moderate stenosis distal vertebral artery due to calcific plaque. Moderate stenosis mid basilar. AICA patent bilaterally. Superior cerebellar and posterior cerebral arteries patent bilaterally. Fetal origin left posterior cerebral artery. Hypoplastic left P1 segment. Venous sinuses: Patent Anatomic variants: Negative for aneurysm Delayed phase: Normal enhancement on delayed imaging. IMPRESSION: No acute intracranial abnormality. Separate CT head results. See separate CT perfusion results. 60% diameter stenosis proximal right internal carotid artery. 50% diameter  stenosis proximal left internal carotid artery. Moderate stenosis through the carotid siphon bilaterally. Moderate stenosis distal vertebral artery bilaterally. Moderate stenosis mid basilar artery. Moderate stenosis distal left M1 segment. Mild stenosis right middle cerebral artery branch to the parietal lobe. Multiple subcutaneous cysts in the face containing gas. These may represent infected dermal cysts. Critical Value/emergent results were called by telephone at the time of interpretation on 05/08/2015 at 3:45 pm to Dr. Faythe Dingwall, who verbally acknowledged these results. Electronically Signed   By: Franchot Gallo M.D.   On: 05/08/2015 15:57   Dg Chest 2 View  05/08/2015  CLINICAL DATA:  Cough  for 2 days EXAM: CHEST  2 VIEW COMPARISON:  Chest radiograph and chest CT May 16, 2012 FINDINGS: There is no edema or consolidation. Heart is borderline enlarged with pulmonary vascularity within normal limits. No adenopathy. No bone lesions. IMPRESSION: Mild cardiac enlargement.  No edema or consolidation. Electronically Signed   By: Lowella Grip III M.D.   On: 05/08/2015 14:51   Ct Head Wo Contrast  05/08/2015  CLINICAL DATA:  Left-sided weakness.  Stroke. EXAM: CT HEAD WITHOUT CONTRAST TECHNIQUE: Contiguous axial images were obtained from the base of the skull through the vertex without intravenous contrast. COMPARISON:  CT head 03/28/2011 FINDINGS: Mild atrophy.  Negative for hydrocephalus Chronic microvascular ischemic change in the white matter. Chronic lacune in the left medial putamen. Chronic infarction left head of caudate. Chronic infarct left inferior cerebellum. Negative for acute infarct. Negative for acute hemorrhage or mass lesion. No shift of the midline structures. Atherosclerotic calcification of the carotid and vertebral arteries bilaterally Mucosal edema paranasal sinuses.  Negative calvarium IMPRESSION: Atrophy and chronic ischemic change. No acute intracranial abnormality Critical  Value/emergent results were called by telephone at the time of interpretation on 05/08/2015 at 3:45 pm to Dr. Faythe Dingwall , who verbally acknowledged these results. Electronically Signed   By: Franchot Gallo M.D.   On: 05/08/2015 15:59   Ct Angio Neck W/cm &/or Wo/cm  05/08/2015  CLINICAL DATA:  Left-sided weakness EXAM: CT ANGIOGRAPHY HEAD AND NECK TECHNIQUE: Multidetector CT imaging of the head and neck was performed using the standard protocol during bolus administration of intravenous contrast. Multiplanar CT image reconstructions and MIPs were obtained to evaluate the vascular anatomy. Carotid stenosis measurements (when applicable) are obtained utilizing NASCET criteria, using the distal internal carotid diameter as the denominator. CONTRAST:  142mL OMNIPAQUE IOHEXOL 350 MG/ML SOLN COMPARISON:  CT head today and 03/28/2011 FINDINGS: CTA NECK Aortic arch: Proximal great vessels patent with mild atherosclerotic calcification at the origin of the innominate artery. Mild apical emphysema bilaterally. Right carotid system: Right common carotid artery widely patent. Calcified plaque in the right carotid bulb. 60% diameter stenosis right internal carotid artery. Right external carotid artery widely patent. Remainder of the right internal carotid artery widely patent. Left carotid system: Left common carotid artery widely patent. Atherosclerotic calcified plaque at the carotid bulb on the left with 50% diameter stenosis. Left external carotid artery widely patent. Left internal carotid artery widely patent. Vertebral arteries:Both vertebral arteries patent to the basilar. No proximal stenosis. Moderate stenosis distal vertebral artery bilaterally due to calcific plaque. Skeleton: Moderate to advanced cervical spondylosis. No fracture or bony lesion. Other neck: Multiple subcutaneous cysts are present in the face bilaterally. These are mostly fluid filled however several also contain gas. 15 mm submandibular nodule on the  right contains gas. Larger nodule anterior to the left parotid gland measures 26 x 17 mm and contains gas. Surrounding subcutaneous fat does not show significant stranding. CTA HEAD Anterior circulation: Atherosclerotic calcification in the cavernous carotid bilaterally with moderate stenosis bilaterally, left greater than right. Moderate stenosis distal left M1 segment. Hypoplastic left A1 segment. Both anterior cerebral arteries supplied from the right and are patent. Right middle cerebral artery is patent with mild stenosis of the parietal branch of the left middle cerebral artery. Posterior circulation: Moderate stenosis distal vertebral artery due to calcific plaque. Moderate stenosis mid basilar. AICA patent bilaterally. Superior cerebellar and posterior cerebral arteries patent bilaterally. Fetal origin left posterior cerebral artery. Hypoplastic left P1 segment. Venous sinuses: Patent Anatomic variants: Negative  for aneurysm Delayed phase: Normal enhancement on delayed imaging. IMPRESSION: No acute intracranial abnormality. Separate CT head results. See separate CT perfusion results. 60% diameter stenosis proximal right internal carotid artery. 50% diameter stenosis proximal left internal carotid artery. Moderate stenosis through the carotid siphon bilaterally. Moderate stenosis distal vertebral artery bilaterally. Moderate stenosis mid basilar artery. Moderate stenosis distal left M1 segment. Mild stenosis right middle cerebral artery branch to the parietal lobe. Multiple subcutaneous cysts in the face containing gas. These may represent infected dermal cysts. Critical Value/emergent results were called by telephone at the time of interpretation on 05/08/2015 at 3:45 pm to Dr. Faythe Dingwall, who verbally acknowledged these results. Electronically Signed   By: Franchot Gallo M.D.   On: 05/08/2015 15:57   Mr Brain Wo Contrast  05/08/2015  CLINICAL DATA:  65 year old hypertensive male with left-sided weakness and  facial droop. Subsequent encounter. EXAM: MRI HEAD WITHOUT CONTRAST TECHNIQUE: Multiplanar, multiecho pulse sequences of the brain and surrounding structures were obtained without intravenous contrast. COMPARISON:  CT and CT angiogram 05/08/2015.  06/28/2008 brain MR. FINDINGS: Acute nonhemorrhagic small infarct at the junction of the right pons and medulla. Remote moderate size infarct inferior left cerebellum. Remote bilateral basal ganglia and thalamic infarcts. Remote bilateral corona radiata infarcts extend across the corpus callosum. Tiny blood breakdown products right thalamus consistent with prior hemorrhagic ischemia otherwise no intracranial hemorrhage. Moderate small vessel disease type changes. Global atrophy without hydrocephalus. No intracranial mass lesion noted on this unenhanced exam. Major intracranial vascular structures appear patent. Degree of narrowing better assessed on recent CT angiogram. Cervical spondylotic changes with spinal stenosis and mild cord flattening C3-4. Cervical medullary junction, pituitary and pineal region within normal limits. Post lens replacement with mild exophthalmos. Paranasal sinus mucosal thickening most notable involving ethmoid sinus air cells. Nonspecific 2 cm subcutaneous mass superficial to the left masseter muscle. IMPRESSION: Acute nonhemorrhagic small infarct at the junction of the right pons and medulla. Remote moderate size infarct inferior left cerebellum. Remote bilateral basal ganglia and thalamic infarcts. Remote bilateral corona radiata infarcts extend across the corpus callosum. Moderate small vessel disease type changes. Global atrophy Major intracranial vascular structures appear patent. Degree of narrowing better assessed on recent CT angiogram. Cervical spondylotic changes with spinal stenosis and mild cord flattening C3-4. Cervical medullary junction, pituitary and pineal region within normal limits. Post lens replacement with mild exophthalmos.  Paranasal sinus mucosal thickening most notable involving ethmoid sinus air cells. Nonspecific 2 cm subcutaneous mass superficial to the left masseter muscle. Electronically Signed   By: Genia Del M.D.   On: 05/08/2015 17:29   Ct Cerebral Perfusion W/cm  05/08/2015  CLINICAL DATA:  Left-sided weakness.  Stroke. EXAM: CT CEREBRAL PERFUSION WITH CONTRAST TECHNIQUE: CT perfusion was obtained through the basal ganglia and cerebral hemispheres. Computer generator calculations of mean transit time, time to peak perfusion, and cerebral blood volume were obtained. CONTRAST:  173mL OMNIPAQUE IOHEXOL 350 MG/ML SOLN COMPARISON:  CT head today FINDINGS: No areas of abnormal perfusion identified. Normal mean transit time. Normal cerebral blood volume. No evidence of infarct or acute ischemia. IMPRESSION: Negative CT perfusion Critical Value/emergent results were called by telephone at the time of interpretation on 05/08/2015 at 3:45 Pm to Dr. Faythe Dingwall , who verbally acknowledged these results. Electronically Signed   By: Franchot Gallo M.D.   On: 05/08/2015 16:02    Assessment/Plan: Diagnosis: non-hemorrhagic infarct in right pons and medulla Labs and images independently reviewed.  Records reviewed and summated above. Stroke: Continue secondary  stroke prophylaxis and Risk Factor Modification listed below:   Antiplatelet therapy:   Blood Pressure Management:  Continue current medication with prn's with permisive HTN per primary team Statin Agent:  Tobacco/cocaine abuse:  Cont to counsel Left sided hemiparesis: fit for orthotics to prevent contractures (resting hand splint for day, wrist cock up splint at night, PRAFO, etc)  Motor recovery: Fluoxetine  1. Does the need for close, 24 hr/day medical supervision in concert with the patient's rehab needs make it unreasonable for this patient to be served in a less intensive setting? Potentially 2. Co-Morbidities requiring supervision/potential complications:  history of CVA with mild LLE weakness, polysubstance (cocaine, cigarettes, etoh - cont to counsel, CIWA, monitor for withdrawls), abuse, dysphagia (cont SLP, consider Vital Stim), HTN (monitor and provide prns in accordance with increased physical exertion and pain), 3. Due to safety, disease management, medication administration and patient education, does the patient require 24 hr/day rehab nursing? Yes 4. Does the patient require coordinated care of a physician, rehab nurse, PT (1-2 hrs/day, 5 days/week), OT (1-2 hrs/day, 5 days/week) and SLP (1-2 hrs/day, 5 days/week) to address physical and functional deficits in the context of the above medical diagnosis(es)? Yes Addressing deficits in the following areas: balance, endurance, locomotion, strength, transferring, dressing, toileting, speech, swallowing and psychosocial support 5. Can the patient actively participate in an intensive therapy program of at least 3 hrs of therapy per day at least 5 days per week? Yes 6. The potential for patient to make measurable gains while on inpatient rehab is excellent 7. Anticipated functional outcomes upon discharge from inpatient rehab are supervision and min assist  with PT, supervision and min assist with OT, modified independent and supervision with SLP. 8. Estimated rehab length of stay to reach the above functional goals is: 16-19 days. 9. Does the patient have adequate social supports and living environment to accommodate these discharge functional goals? Potentially 10. Anticipated D/C setting: Home 11. Anticipated post D/C treatments: HH therapy and Home excercise program 12. Overall Rehab/Functional Prognosis: good and fair  RECOMMENDATIONS: This patient's condition is appropriate for continued rehabilitative care in the following setting: CIR if pt has 24/7 support after discharge.  Patient has agreed to participate in recommended program. Yes Note that insurance prior authorization may be required  for reimbursement for recommended care.  Comment: Rehab Admissions Coordinator to follow up  Delice Lesch, MD 05/09/2015

## 2015-05-09 NOTE — Progress Notes (Signed)
PT Cancellation Note  Patient Details Name: Andre [name] MRN: [id number] DOB: 1950-07-12   Cancelled Treatment:    Reason Eval/Treat Not Completed: Patient at procedure or test/unavailable. Pt currently off unit for testing. Will check back as schedule allows to complete PT eval.    Rolinda Roan 05/09/2015, 9:51 AM   Rolinda Roan, PT, DPT Acute Rehabilitation Services Pager: [phone]

## 2015-05-09 NOTE — Procedures (Signed)
ELECTROENCEPHALOGRAM REPORT   Patient: Andre [name]        Age: 65 y.o.        Sex: male Referring Physician: Dr Conley Canal Report Date:  05/09/2015        Interpreting Physician: Hulen Luster  History: [name] is an 65 y.o. male admitted with left sided weakness  Medications:  I have reviewed the patient's current medications.  Conditions of Recording:  This is a 16 channel EEG carried out with the patient in the drowsy state.  Description:  The waking background activity consists of a low voltage, symmetrical, fairly well organized, 8-10 Hz alpha activity, seen from the parieto-occipital and posterior temporal regions. No focal slowing or epileptiform activity noted. The patient drowses with slowing to irregular, low voltage theta and beta activity.    Hyperventilation was not performed. Intermittent photic stimulation was not performed.   IMPRESSION: Normal drowsy electroencephalogram. There are no focal lateralizing or epileptiform features.   Jim Like, DO [facility] [phone]  If 7pm- 7am, please page neurology on call as listed in AMION. 05/09/2015, 11:50 AM

## 2015-05-09 NOTE — Progress Notes (Signed)
STROKE TEAM PROGRESS NOTE   HISTORY Andre [name] is an 65 y.o.male who is a poor historian. Patient states he was out drinking with his friends last night and did not go to bed until 0500 hours this AM. He awoke around 1400 hours and noted he could not move his left side and had a facial droop. He states he has some weakness from previous stroke but the symptoms have become much more pronounced. No family is available at bedside for corroborating history.   Date last known well: 05/08/2015 Time last known well: Time: 05:00 tPA Given: No: out of time window    SUBJECTIVE (INTERVAL HISTORY) The patient has a friend at the bedside. He gave me permission to discuss his medical issues in front of his friend. The patient is wearing an eye patch secondary to diplopia. I strongly recommended that the patient abstain from tobacco, alcohol, and cocaine. The patient denies cocaine use despite positive UDS.    OBJECTIVE Temp:  [97.7 F (36.5 C)-98.7 F (37.1 C)] 98.7 F (37.1 C) (01/04 1020) Pulse Rate:  [66-83] 71 (01/04 1020) Cardiac Rhythm:  [-] Normal sinus rhythm (01/04 0700) Resp:  [16-20] 20 (01/04 1020) BP: (128-163)/(81-111) 141/81 mmHg (01/04 1020) SpO2:  [95 %-99 %] 97 % (01/04 1020) Weight:  [77.474 kg (170 lb 12.8 oz)] 77.474 kg (170 lb 12.8 oz) (01/03 1740)  CBC:   Recent Labs Lab 05/08/15 1435 05/08/15 1444 05/09/15 0546  WBC 8.7  --  6.9  NEUTROABS 6.5  --   --   HGB 14.2 16.3 13.1  HCT 43.7 48.0 40.8  MCV 90.9  --  91.5  PLT 206  --  123456    Basic Metabolic Panel:   Recent Labs Lab 05/08/15 1435 05/08/15 1444 05/09/15 0546  NA 138 138 140  K 4.1 3.9 3.9  CL 103 101 105  CO2 24  --  25  GLUCOSE 90 85 70  BUN 11 12 10   CREATININE 1.15 1.20 1.14  CALCIUM 9.0  --  8.9    Lipid Panel:     Component Value Date/Time   CHOL 137 05/09/2015 0546   TRIG 198* 05/09/2015 0546   HDL 45 05/09/2015 0546   CHOLHDL 3.0 05/09/2015 0546   VLDL 40 05/09/2015  0546   LDLCALC 52 05/09/2015 0546   HgbA1c:  Lab Results  Component Value Date   HGBA1C 5.2 03/28/2011   Urine Drug Screen:     Component Value Date/Time   LABOPIA NONE DETECTED 05/08/2015 1629   COCAINSCRNUR POSITIVE* 05/08/2015 1629   LABBENZ NONE DETECTED 05/08/2015 1629   AMPHETMU NONE DETECTED 05/08/2015 1629   THCU NONE DETECTED 05/08/2015 1629   LABBARB NONE DETECTED 05/08/2015 1629      IMAGING I have personally reviewed the radiological images below and agree with the radiology interpretations.  Ct Angio Head and Neck W/cm &/or Wo Cm 05/08/2015   No acute intracranial abnormality. Separate CT head results. See separate CT perfusion results. 60% diameter stenosis proximal right internal carotid artery. 50% diameter stenosis proximal left internal carotid artery. Moderate stenosis through the carotid siphon bilaterally. Moderate stenosis distal vertebral artery bilaterally. Moderate stenosis mid basilar artery. Moderate stenosis distal left M1 segment. Mild stenosis right middle cerebral artery branch to the parietal lobe. Multiple subcutaneous cysts in the face containing gas. These may represent infected dermal cysts.   Dg Chest 2 View 05/08/2015 Mild cardiac enlargement.  No edema or consolidation.   Ct Head Wo Contrast 05/08/2015  Atrophy and chronic ischemic change. No acute intracranial abnormality   Mr Brain Wo Contrast 05/08/2015   Acute nonhemorrhagic small infarct at the junction of the right pons and medulla. Remote moderate size infarct inferior left cerebellum. Remote bilateral basal ganglia and thalamic infarcts. Remote bilateral corona radiata infarcts extend across the corpus callosum. Moderate small vessel disease type changes. Global atrophy Major intracranial vascular structures appear patent. Degree of narrowing better assessed on recent CT angiogram. Cervical spondylotic changes with spinal stenosis and mild cord flattening C3-4. Cervical medullary junction,  pituitary and pineal region within normal limits. Post lens replacement with mild exophthalmos. Paranasal sinus mucosal thickening most notable involving ethmoid sinus air cells. Nonspecific 2 cm subcutaneous mass superficial to the left masseter muscle.   Ct Cerebral Perfusion W/cm 05/08/2015   Negative CT perfusion   2-D echocardiogram 05/09/2015 Study Conclusions - Left ventricle: The cavity size was normal. Wall thickness was increased in a pattern of moderate LVH. Systolic function was normal. The estimated ejection fraction was in the range of 50% to 55%. Wall motion was normal; there were no regional wall motion abnormalities. Doppler parameters are consistent with abnormal left ventricular relaxation (grade 1 diastolic dysfunction). - Right atrium: The atrium was mildly dilated.   PHYSICAL EXAM  Temp:  [97.7 F (36.5 C)-98.7 F (37.1 C)] 98.4 F (36.9 C) (01/04 1436) Pulse Rate:  [66-79] 71 (01/04 1436) Resp:  [16-20] 20 (01/04 1436) BP: (137-163)/(81-95) 137/85 mmHg (01/04 1436) SpO2:  [95 %-99 %] 97 % (01/04 1436) Weight:  [170 lb 12.8 oz (77.474 kg)] 170 lb 12.8 oz (77.474 kg) (01/03 1740)  General - Well nourished, well developed, in mild distress due to diplopia.  Ophthalmologic - Fundi not visualized due to eye discomfort.  Cardiovascular - Regular rate and rhythm.  Mental Status -  Level of arousal and orientation to time, place, and person were intact. Language including expression, naming, repetition, comprehension was assessed and found intact, moderate dysarthria. Fund of Knowledge was assessed and was intact.  Cranial Nerves II - XII - II - Visual field intact OU. III, IV, VI - eyes disconjuaged, left eye at lateral position with minimal movement, right eye in midposition able to move up and down but with nystagmus. PERRL. V - Facial sensation intact bilaterally. VII - left facial droop. VIII - Hearing & vestibular intact bilaterally. X -  Palate elevates symmetrically, moderate dysarthria. XI - Chin turning & shoulder shrug intact bilaterally. XII - Tongue protrusion intact.  Motor Strength - The patient's strength was 3/5 LUE and 1/5 LLE and pronator drift was present. RUE and RLE 5/5. Bulk was normal and fasciculations were absent.   Motor Tone - Muscle tone was assessed at the neck and appendages and was normal.  Reflexes - The patient's reflexes were 1+ in all extremities and he had no pathological reflexes.  Sensory - Light touch, temperature/pinprick were assessed and were decreased on the right.    Coordination - The patient had normal movements in right hand with no ataxia or dysmetria.  Tremor was absent.  Gait and Station - not tested due to weakness.   ASSESSMENT/PLAN [name] is a 65 y.o. male with history of hypertension, previous left cerebellar stroke, alcohol abuse, current smoker and substance abuse presenting with left hemiparesis and facial droop.  He did not receive IV t-PA due to  late presentation.   Stroke:  Right pontomedullary infarct secondary to small vessel disease. Remote strokes also felt to be small  vessel disease related.  Resultant  Left hemiparesis, eye disconjugation and left facial droop  MRI  infarct at the junction of the right pons and medulla.  Multiple remote infarcts at BG and cerebellar.   CTA head and neck -  diffuse cerebrovascular disease with right ICA 60%, left ICA 50%, b/l siphon, moderate b/l VA and BA stenosis. Mild left M1 stenosis  2D Echo - EF 50-55%. No cardiac source of emboli identified.  EEG - Normal drowsy electroencephalogram. There are no focal lateralizing or epileptiform features.  LDL - 52  HgbA1c pending  VTE prophylaxis - Lovenox DIET DYS 3 Room service appropriate?: Yes with Assist; Fluid consistency:: Thin  No antithrombotic prior to admission, now on clopidogrel 75 mg daily. Due to diffuse intracranial stenosis, recommend dual  antiplatelet with ASA and plavix for 3 months and then plavix alone.  Patient counseled to be compliant with his antithrombotic medications  Ongoing aggressive stroke risk factor management  Therapy recommendations:  CIR recommended. Will consult rehabilitation physician.  Disposition: pending   Carotid A stenosis  Right ICA 60% and left ICA 50% stenosis  Asymptomatic at this time  Follow up as outpt for monitoring  Cocaine abuse  UDS positive for cocaine  Pt denies so far  Pt was counseled on abstain from substance  Hypertension  Stable  Permissive hypertension (OK if < 220/120) but gradually normalize in 5-7 days  Hyperlipidemia  Home meds: Zocor 20 mg daily resumed in hospital  LDL 52, goal < 70  Continue statin at discharge  Tobacco abuse  Current smoker  Smoking cessation counseling provided  Pt is willing to quit  Other Stroke Risk Factors  Advanced age  ETOH use - on B1, FA, and multivitamine  Hx stroke/TIA  Other Active Problems  Diplopia on eye patch  Hospital day # 0  Neurology will sign off. Please call with questions. Pt will follow up with [name] at [facility] in about 2 months. Thanks for the consult.  Rosalin Hawking, MD PhD Stroke Neurology 05/09/2015 4:58 PM    To contact Stroke Continuity provider, please refer to [email or web address] After hours, contact General Neurology

## 2015-05-09 NOTE — Progress Notes (Signed)
OT Cancellation Note  Patient Details Name: Andre [name] MRN: [id number] DOB: Dec 01, 1950   Cancelled Treatment:    Reason Eval/Treat Not Completed: Patient at procedure or test/ unavailable. Pt at EEG. OT to reattempt as schedule permits.  Hortencia Pilar 05/09/2015, 9:49 AM

## 2015-05-09 NOTE — Progress Notes (Signed)
EEG Completed; Results Pending  

## 2015-05-09 NOTE — Progress Notes (Signed)
*  PRELIMINARY RESULTS* Echocardiogram 2D Echocardiogram has been performed.  Leavy Cella 05/09/2015, 12:13 PM

## 2015-05-09 NOTE — Evaluation (Signed)
Clinical/Bedside Swallow Evaluation Patient Details  Name: Andre [name] MRN: [id number] Date of Birth: 10/27/1950  Today's Date: 05/09/2015 Time: SLP Start Time (ACUTE ONLY): [id number] SLP Stop Time (ACUTE ONLY): 0810 SLP Time Calculation (min) (ACUTE ONLY): 37 min  Past Medical History:  Past Medical History  Diagnosis Date  . Hypertension   . Stroke [facility])    Past Surgical History:  Past Surgical History  Procedure Laterality Date  . Abdominal surgery     HPI:  65 y.o. male has a past medical history significant for stroke, hypertension, alcohol dependence and polysubstance abuse, presents to the emergency room with difficulties moving his left arm and left leg as well as weakness in his face, slurred speech and double vision.  Pt failed RN SSS and SLP eval indicated.  Pt found to have a CVA right pons and medulla junction.   Pt reports symptoms started at 2 am on Tuesday.  CXR negative.  Prior dysphagia - decreased UES opening with subsequent residuals.  Pt recommend dys3/ground meats/thin with 2-3 swallows.  Pt denies dysphagia but admits to cold symptoms x 1 week with coughing and expectoration of secretions - clear.     Assessment / Plan / Recommendation Clinical Impression  Pt presents with indications of mild oral dysphagia likely due to facial and hypoglossal deficits exacerbated by inadequate dentition.  Slow oral transiting/coordination noted across all consistencies. No symptoms of airway compromise or pharyngeal residuals noted.  Cough at baseline noted, * pt reports feeling like he has a "cold" for one week characterized by productive cough.  SLP reviewed prior MBS from Nov 2012 with pt and compensation strategies found to be helpful at that time.  Suspect his dysphagia had improved significantly following prior CVA as he denies dysphagia symptoms PTA.  Pt does admit to worsening dysarthria and decreased vocal strength at this time.  Skilled intervention included determining  helpful compensation strategies.   Will follow up x1 due to recent "cold" and h/o dysphagia.  Using teachback reviewed compensation strategies.     Aspiration Risk  Mild aspiration risk    Diet Recommendation Dysphagia 3 (Mech soft);Thin liquid   Liquid Administration via: Cup;Straw Medication Administration: Whole meds with puree Supervision: Patient able to self feed (set up assist) Compensations: Slow rate;Small sips/bites (drink liquids t/o meal, intermittent dry swallow)    Other  Recommendations Oral Care Recommendations: Oral care BID   Follow up Recommendations   (tbd)    Frequency and Duration min 1 x/week  1 week       Prognosis Prognosis for Safe Diet Advancement: Good      Swallow Study   General Date of Onset: 05/09/15 HPI: 65 y.o. male has a past medical history significant for stroke, hypertension, alcohol dependence and polysubstance abuse, presents to the emergency room with difficulties moving his left arm and left leg as well as weakness in his face, slurred speech and double vision.  Pt failed RN SSS and SLP eval indicated.  Pt found to have a CVA right pons and medulla junction.   Pt reports symptoms started at 2 am on Tuesday.  CXR negative.  Prior dysphagia - decreased UES opening with subsequent residuals.  Pt recommend dys3/ground meats/thin with 2-3 swallows.  Pt denies dysphagia but admits to cold symptoms x 1 week with coughing and expectoration of secretions - clear.   Type of Study: Bedside Swallow Evaluation Diet Prior to this Study: NPO Temperature Spikes Noted: No Respiratory Status: Room air Behavior/Cognition: Cooperative;Alert;Pleasant mood  Oral Cavity Assessment: Within Functional Limits Oral Care Completed by SLP: No Oral Cavity - Dentition:  (single tooth bottom) Vision: Impaired for self-feeding Patient Positioning: Upright in bed Baseline Vocal Quality: Low vocal intensity Volitional Cough: Strong Volitional Swallow: Able to elicit     Oral/Motor/Sensory Function Overall Oral Motor/Sensory Function: Mild impairment Facial ROM: Reduced left Facial Symmetry: Abnormal symmetry left Facial Strength: Reduced left Facial Sensation: Reduced left Lingual ROM: Reduced left Lingual Symmetry: Abnormal symmetry left Lingual Strength: Reduced;Suspected CN XII (hypoglossal) dysfunction Velum:  (sluggish) Mandible: Impaired   Ice Chips Ice chips: Within functional limits Presentation: Spoon   Thin Liquid Thin Liquid: Within functional limits Presentation: Cup;Straw    Nectar Thick Nectar Thick Liquid: Within functional limits Presentation: Cup;Straw   Honey Thick Honey Thick Liquid: Not tested   Puree Puree: Within functional limits Presentation: Self Fed;Spoon Other Comments: slow oral transiting due to lingual weakness   Solid   GO Functional Assessment Tool Used: clinical judgement Functional Limitations: Swallowing Swallow Current Status [id number]): At least 20 percent but less than 40 percent impaired, limited or restricted Swallow Goal Status [id number]): At least 1 percent but less than 20 percent impaired, limited or restricted  Other Comments: slow oral transiting and mastication due to lingual weakness       Luanna Salk, [facility] SLP [phone]

## 2015-05-09 NOTE — Evaluation (Addendum)
Speech Language Pathology Evaluation Patient Details Name: Andre [name] MRN: [id number] DOB: 08-03-1950 Today's Date: 05/09/2015 Time: [id number] SLP Time Calculation (min) (ACUTE ONLY): 24 min  Problem List:  Patient Active Problem List   Diagnosis Date Noted  . TIA (transient ischemic attack) 05/08/2015  . Left-sided weakness   . Alcohol abuse   . Stroke, hemorrhagic ([place]) 03/31/2011  . Alcohol withdrawal ([place]) 03/31/2011  . Polysubstance abuse 03/31/2011   Past Medical History:  Past Medical History  Diagnosis Date  . Hypertension   . Stroke [facility])    Past Surgical History:  Past Surgical History  Procedure Laterality Date  . Abdominal surgery     HPI:  65 y.o. male has a past medical history significant for stroke, hypertension, alcohol dependence and polysubstance abuse, presents to the emergency room with difficulties moving his left arm and left leg as well as weakness in his face, slurred speech and double vision.  Pt failed RN SSS and SLP eval indicated.  Pt found to have a CVA right pons and medulla junction.   Pt reports symptoms started at 2 am on Tuesday.  CXR negative.  Prior dysphagia - decreased UES opening with subsequent residuals.  Pt recommend dys3/ground meats/thin with 2-3 swallows.  Pt denies dysphagia but admits to cold symptoms x 1 week with coughing and expectoration of secretions - clear.     Assessment / Plan / Recommendation Clinical Impression  Pt presents with moderate mixed dysarthria for which he is able to compensate with moderate cues to slow rate and overarticulate.    Good awareness demonstrated by pt as he reports need to call for assist due to worsening left sided weakness and worsening dysarthria.   Given medical hx of CVA and substance abuse, SLP suspects some baseline cognitive linguistic deficits but no family present to confirm.  Pt's receptive language appears functional and he is able to repeat full sentences accurately.  Abstract  thinking skills and higher level cognition is impaired.     Pt complained of double vision that causes dizziness and covering left eye resolved this issue per pt.  Covering right eye not helpful - defer vision to OT.  In the interim, for comfort, SLP fashioned "tissue eye patch" for pt and reported findings to RN.  RN reports she would order pt an eye patch.    Using teach back, SLP instructed and reinforced effective compensation strategies for pt's dysarthria.  Pt reports his significant other manages finances, home duties and pt manages medications (with pharmacy assist) and appoinments.  SLP to follow up to help mitigate caregiver burden - pt agreeable to plan.      SLP Assessment  Patient needs continued Speech Lanaguage Pathology Services    Follow Up Recommendations   (TBD)    Frequency and Duration min 1 x/week  1 week      SLP Evaluation Prior Functioning  Cognitive/Linguistic Baseline: Baseline deficits Type of Home: Apartment  Lives With: Significant other Vocation: On disability   Cognition  Overall Cognitive Status: No family/caregiver present to determine baseline cognitive functioning Arousal/Alertness: Awake/alert Orientation Level: Oriented X4 Attention: Sustained;Selective Sustained Attention: Appears intact Selective Attention: Appears intact Memory: Impaired Memory Impairment: Retrieval deficit (pt recalled 3/5 words independently within 5 minutes, 2 with cues ) Awareness: Appears intact (aware to need to call for assist to prevent falls due to left sided weakness) Safety/Judgment: Appears intact Comments: abstract thinking deficits noted with pt able to identify basic similarities between items eg: food vs  fruit, noise maker vs musical instruments    Comprehension  Auditory Comprehension Overall Auditory Comprehension: Appears within functional limits for tasks assessed Yes/No Questions: Not tested Commands: Within Functional Limits Conversation:  Complex Interfering Components: Visual impairments (pt seeing double - covering left eye helpful- RN to order eye patch) Reading Comprehension Reading Status: Not tested (pt reports he does not read at baseline, double vision resolved with covering left eye)    Expression Expression Primary Mode of Expression: Verbal Verbal Expression Overall Verbal Expression: Appears within functional limits for tasks assessed Initiation: No impairment Level of Generative/Spontaneous Verbalization: Sentence Repetition: No impairment Naming: No impairment (for basic items) Pragmatics: No impairment Interfering Components: Speech intelligibility;Premorbid deficit Other Verbal Expression Comments: pt named 3 animals in 60 seconds - 2 without cues =- he admits to deficits since prior CVA Written Expression Dominant Hand: Right Written Expression: Not tested   Oral / Motor Oral Motor/Sensory Function Overall Oral Motor/Sensory Function: Mild impairment Facial ROM: Reduced left Facial Symmetry: Abnormal symmetry left Facial Strength: Reduced left Facial Sensation: Reduced left Lingual ROM: Reduced left Lingual Symmetry: Abnormal symmetry left Lingual Strength: Reduced;Suspected CN XII (hypoglossal) dysfunction Velum:  (sluggish) Mandible: Impaired Motor Speech Overall Motor Speech: Impaired Respiration: Impaired Level of Impairment: Sentence Phonation: Low vocal intensity Articulation: Impaired Level of Impairment: Phrase Intelligibility: Intelligibility reduced Word: 75-100% accurate Phrase: 50-74% accurate Sentence: 50-74% accurate Conversation: Not tested Motor Planning: Witnin functional limits Interfering Components: Premorbid status;Inadequate dentition Effective Techniques: Over-articulate;Increased vocal intensity    Janett Labella [place], [place] [facility] SLP [phone]

## 2015-05-09 NOTE — Progress Notes (Signed)
[facility] PROGRESS NOTE  JAMONT COCANOUGHER [id number] DOB: [birth year] DOA: 05/08/2015 PCP: No primary care provider on file.  Assessment/Plan:  Principal Problem:   Acute ischemic stroke, right pons and medulla with history of multiple previous strokes: Workup underway. CTA neck and head noted.LDL 52. Echocardiogram, hemoglobin A1c, PT, OT consult pending. On Plavix per neurology. Might need placement. Active Problems:   Polysubstance abuse including cocaine and alcohol, counseled against. Cont CIWA protocol. Will consult social work.   Essential hypertension: permissive hypertension   Code Status:  full Family Communication:  Friend at bedside Disposition Plan:  ?  Consultants:  neurology  Procedures:     Antibiotics:    HPI/Subjective: Hungry. Still with left-sided weakness.  Objective: Filed Vitals:   05/09/15 0652 05/09/15 1020  BP: 156/94 141/81  Pulse: 68 71  Temp: 97.7 F (36.5 C) 98.7 F (37.1 C)  Resp: 18 20    Intake/Output Summary (Last 24 hours) at 05/09/15 1146 Last data filed at 05/09/15 1024  Gross per 24 hour  Intake      0 ml  Output    950 ml  Net   -950 ml   Filed Weights   05/08/15 1353 05/08/15 1740  Weight: 89.812 kg (198 lb) 77.474 kg (170 lb 12.8 oz)    Exam:   General:  Alert, oriented. Has left eye patch on. Cooperative.  Cardiovascular: regular rate rhythm without murmurs gallops rubs  Respiratory: clear to auscultation bilaterally without wheezes rhonchi or rales  Abdomen: nontender nondistended  Ext: no clubbing cyanosis or edema. No tremulousness.  Neurologic: Left upper extremity strength 2-3/5. Left lower extremity strength 2/5. Decreased light touch sensation on the left hand  Basic Metabolic Panel:  Recent Labs Lab 05/08/15 1435 05/08/15 1444 05/09/15 0546  NA 138 138 140  K 4.1 3.9 3.9  CL 103 101 105  CO2 24  --  25  GLUCOSE 90 85 70  BUN 11 12 10   CREATININE 1.15 1.20 1.14  CALCIUM  9.0  --  8.9   Liver Function Tests:  Recent Labs Lab 05/08/15 1435  AST 21  ALT 18  ALKPHOS 53  BILITOT 1.2  PROT 6.8  ALBUMIN 3.6   No results for input(s): LIPASE, AMYLASE in the last 168 hours.  Recent Labs Lab 05/08/15 1604  AMMONIA 37*   CBC:  Recent Labs Lab 05/08/15 1435 05/08/15 1444 05/09/15 0546  WBC 8.7  --  6.9  NEUTROABS 6.5  --   --   HGB 14.2 16.3 13.1  HCT 43.7 48.0 40.8  MCV 90.9  --  91.5  PLT 206  --  203   Cardiac Enzymes: No results for input(s): CKTOTAL, CKMB, CKMBINDEX, TROPONINI in the last 168 hours. BNP (last 3 results) No results for input(s): BNP in the last 8760 hours.  ProBNP (last 3 results) No results for input(s): PROBNP in the last 8760 hours.  CBG:  Recent Labs Lab 05/08/15 1359  GLUCAP 94    No results found for this or any previous visit (from the past 240 hour(s)).   Studies: Ct Angio Head W/cm &/or Wo Cm  05/08/2015  CLINICAL DATA:  Left-sided weakness EXAM: CT ANGIOGRAPHY HEAD AND NECK TECHNIQUE: Multidetector CT imaging of the head and neck was performed using the standard protocol during bolus administration of intravenous contrast. Multiplanar CT image reconstructions and MIPs were obtained to evaluate the vascular anatomy. Carotid stenosis measurements (when applicable) are obtained utilizing NASCET criteria, using the distal internal carotid  diameter as the denominator. CONTRAST:  148mL OMNIPAQUE IOHEXOL 350 MG/ML SOLN COMPARISON:  CT head today and 03/28/2011 FINDINGS: CTA NECK Aortic arch: Proximal great vessels patent with mild atherosclerotic calcification at the origin of the innominate artery. Mild apical emphysema bilaterally. Right carotid system: Right common carotid artery widely patent. Calcified plaque in the right carotid bulb. 60% diameter stenosis right internal carotid artery. Right external carotid artery widely patent. Remainder of the right internal carotid artery widely patent. Left carotid system:  Left common carotid artery widely patent. Atherosclerotic calcified plaque at the carotid bulb on the left with 50% diameter stenosis. Left external carotid artery widely patent. Left internal carotid artery widely patent. Vertebral arteries:Both vertebral arteries patent to the basilar. No proximal stenosis. Moderate stenosis distal vertebral artery bilaterally due to calcific plaque. Skeleton: Moderate to advanced cervical spondylosis. No fracture or bony lesion. Other neck: Multiple subcutaneous cysts are present in the face bilaterally. These are mostly fluid filled however several also contain gas. 15 mm submandibular nodule on the right contains gas. Larger nodule anterior to the left parotid gland measures 26 x 17 mm and contains gas. Surrounding subcutaneous fat does not show significant stranding. CTA HEAD Anterior circulation: Atherosclerotic calcification in the cavernous carotid bilaterally with moderate stenosis bilaterally, left greater than right. Moderate stenosis distal left M1 segment. Hypoplastic left A1 segment. Both anterior cerebral arteries supplied from the right and are patent. Right middle cerebral artery is patent with mild stenosis of the parietal branch of the left middle cerebral artery. Posterior circulation: Moderate stenosis distal vertebral artery due to calcific plaque. Moderate stenosis mid basilar. AICA patent bilaterally. Superior cerebellar and posterior cerebral arteries patent bilaterally. Fetal origin left posterior cerebral artery. Hypoplastic left P1 segment. Venous sinuses: Patent Anatomic variants: Negative for aneurysm Delayed phase: Normal enhancement on delayed imaging. IMPRESSION: No acute intracranial abnormality. Separate CT head results. See separate CT perfusion results. 60% diameter stenosis proximal right internal carotid artery. 50% diameter stenosis proximal left internal carotid artery. Moderate stenosis through the carotid siphon bilaterally. Moderate  stenosis distal vertebral artery bilaterally. Moderate stenosis mid basilar artery. Moderate stenosis distal left M1 segment. Mild stenosis right middle cerebral artery branch to the parietal lobe. Multiple subcutaneous cysts in the face containing gas. These may represent infected dermal cysts. Critical Value/emergent results were called by telephone at the time of interpretation on 05/08/2015 at 3:45 pm to Dr. Faythe Dingwall, who verbally acknowledged these results. Electronically Signed   By: Franchot Gallo M.D.   On: 05/08/2015 15:57   Dg Chest 2 View  05/08/2015  CLINICAL DATA:  Cough for 2 days EXAM: CHEST  2 VIEW COMPARISON:  Chest radiograph and chest CT May 16, 2012 FINDINGS: There is no edema or consolidation. Heart is borderline enlarged with pulmonary vascularity within normal limits. No adenopathy. No bone lesions. IMPRESSION: Mild cardiac enlargement.  No edema or consolidation. Electronically Signed   By: Lowella Grip III M.D.   On: 05/08/2015 14:51   Ct Head Wo Contrast  05/08/2015  CLINICAL DATA:  Left-sided weakness.  Stroke. EXAM: CT HEAD WITHOUT CONTRAST TECHNIQUE: Contiguous axial images were obtained from the base of the skull through the vertex without intravenous contrast. COMPARISON:  CT head 03/28/2011 FINDINGS: Mild atrophy.  Negative for hydrocephalus Chronic microvascular ischemic change in the white matter. Chronic lacune in the left medial putamen. Chronic infarction left head of caudate. Chronic infarct left inferior cerebellum. Negative for acute infarct. Negative for acute hemorrhage or mass lesion. No shift of  the midline structures. Atherosclerotic calcification of the carotid and vertebral arteries bilaterally Mucosal edema paranasal sinuses.  Negative calvarium IMPRESSION: Atrophy and chronic ischemic change. No acute intracranial abnormality Critical Value/emergent results were called by telephone at the time of interpretation on 05/08/2015 at 3:45 pm to Dr. Faythe Dingwall , who  verbally acknowledged these results. Electronically Signed   By: Franchot Gallo M.D.   On: 05/08/2015 15:59   Ct Angio Neck W/cm &/or Wo/cm  05/08/2015  CLINICAL DATA:  Left-sided weakness EXAM: CT ANGIOGRAPHY HEAD AND NECK TECHNIQUE: Multidetector CT imaging of the head and neck was performed using the standard protocol during bolus administration of intravenous contrast. Multiplanar CT image reconstructions and MIPs were obtained to evaluate the vascular anatomy. Carotid stenosis measurements (when applicable) are obtained utilizing NASCET criteria, using the distal internal carotid diameter as the denominator. CONTRAST:  113mL OMNIPAQUE IOHEXOL 350 MG/ML SOLN COMPARISON:  CT head today and 03/28/2011 FINDINGS: CTA NECK Aortic arch: Proximal great vessels patent with mild atherosclerotic calcification at the origin of the innominate artery. Mild apical emphysema bilaterally. Right carotid system: Right common carotid artery widely patent. Calcified plaque in the right carotid bulb. 60% diameter stenosis right internal carotid artery. Right external carotid artery widely patent. Remainder of the right internal carotid artery widely patent. Left carotid system: Left common carotid artery widely patent. Atherosclerotic calcified plaque at the carotid bulb on the left with 50% diameter stenosis. Left external carotid artery widely patent. Left internal carotid artery widely patent. Vertebral arteries:Both vertebral arteries patent to the basilar. No proximal stenosis. Moderate stenosis distal vertebral artery bilaterally due to calcific plaque. Skeleton: Moderate to advanced cervical spondylosis. No fracture or bony lesion. Other neck: Multiple subcutaneous cysts are present in the face bilaterally. These are mostly fluid filled however several also contain gas. 15 mm submandibular nodule on the right contains gas. Larger nodule anterior to the left parotid gland measures 26 x 17 mm and contains gas. Surrounding  subcutaneous fat does not show significant stranding. CTA HEAD Anterior circulation: Atherosclerotic calcification in the cavernous carotid bilaterally with moderate stenosis bilaterally, left greater than right. Moderate stenosis distal left M1 segment. Hypoplastic left A1 segment. Both anterior cerebral arteries supplied from the right and are patent. Right middle cerebral artery is patent with mild stenosis of the parietal branch of the left middle cerebral artery. Posterior circulation: Moderate stenosis distal vertebral artery due to calcific plaque. Moderate stenosis mid basilar. AICA patent bilaterally. Superior cerebellar and posterior cerebral arteries patent bilaterally. Fetal origin left posterior cerebral artery. Hypoplastic left P1 segment. Venous sinuses: Patent Anatomic variants: Negative for aneurysm Delayed phase: Normal enhancement on delayed imaging. IMPRESSION: No acute intracranial abnormality. Separate CT head results. See separate CT perfusion results. 60% diameter stenosis proximal right internal carotid artery. 50% diameter stenosis proximal left internal carotid artery. Moderate stenosis through the carotid siphon bilaterally. Moderate stenosis distal vertebral artery bilaterally. Moderate stenosis mid basilar artery. Moderate stenosis distal left M1 segment. Mild stenosis right middle cerebral artery branch to the parietal lobe. Multiple subcutaneous cysts in the face containing gas. These may represent infected dermal cysts. Critical Value/emergent results were called by telephone at the time of interpretation on 05/08/2015 at 3:45 pm to Dr. Faythe Dingwall, who verbally acknowledged these results. Electronically Signed   By: Franchot Gallo M.D.   On: 05/08/2015 15:57   Mr Brain Wo Contrast  05/08/2015  CLINICAL DATA:  65 year old hypertensive male with left-sided weakness and facial droop. Subsequent encounter. EXAM: MRI HEAD WITHOUT CONTRAST  TECHNIQUE: Multiplanar, multiecho pulse sequences of  the brain and surrounding structures were obtained without intravenous contrast. COMPARISON:  CT and CT angiogram 05/08/2015.  06/28/2008 brain MR. FINDINGS: Acute nonhemorrhagic small infarct at the junction of the right pons and medulla. Remote moderate size infarct inferior left cerebellum. Remote bilateral basal ganglia and thalamic infarcts. Remote bilateral corona radiata infarcts extend across the corpus callosum. Tiny blood breakdown products right thalamus consistent with prior hemorrhagic ischemia otherwise no intracranial hemorrhage. Moderate small vessel disease type changes. Global atrophy without hydrocephalus. No intracranial mass lesion noted on this unenhanced exam. Major intracranial vascular structures appear patent. Degree of narrowing better assessed on recent CT angiogram. Cervical spondylotic changes with spinal stenosis and mild cord flattening C3-4. Cervical medullary junction, pituitary and pineal region within normal limits. Post lens replacement with mild exophthalmos. Paranasal sinus mucosal thickening most notable involving ethmoid sinus air cells. Nonspecific 2 cm subcutaneous mass superficial to the left masseter muscle. IMPRESSION: Acute nonhemorrhagic small infarct at the junction of the right pons and medulla. Remote moderate size infarct inferior left cerebellum. Remote bilateral basal ganglia and thalamic infarcts. Remote bilateral corona radiata infarcts extend across the corpus callosum. Moderate small vessel disease type changes. Global atrophy Major intracranial vascular structures appear patent. Degree of narrowing better assessed on recent CT angiogram. Cervical spondylotic changes with spinal stenosis and mild cord flattening C3-4. Cervical medullary junction, pituitary and pineal region within normal limits. Post lens replacement with mild exophthalmos. Paranasal sinus mucosal thickening most notable involving ethmoid sinus air cells. Nonspecific 2 cm subcutaneous mass  superficial to the left masseter muscle. Electronically Signed   By: Genia Del M.D.   On: 05/08/2015 17:29   Ct Cerebral Perfusion W/cm  05/08/2015  CLINICAL DATA:  Left-sided weakness.  Stroke. EXAM: CT CEREBRAL PERFUSION WITH CONTRAST TECHNIQUE: CT perfusion was obtained through the basal ganglia and cerebral hemispheres. Computer generator calculations of mean transit time, time to peak perfusion, and cerebral blood volume were obtained. CONTRAST:  122mL OMNIPAQUE IOHEXOL 350 MG/ML SOLN COMPARISON:  CT head today FINDINGS: No areas of abnormal perfusion identified. Normal mean transit time. Normal cerebral blood volume. No evidence of infarct or acute ischemia. IMPRESSION: Negative CT perfusion Critical Value/emergent results were called by telephone at the time of interpretation on 05/08/2015 at 3:45 Pm to Dr. Faythe Dingwall , who verbally acknowledged these results. Electronically Signed   By: Franchot Gallo M.D.   On: 05/08/2015 16:02    Scheduled Meds: . clopidogrel  75 mg Oral Daily  . enoxaparin (LOVENOX) injection  40 mg Subcutaneous Q24H  . folic acid  1 mg Oral Daily  . LORazepam  0-4 mg Intravenous Q6H   Followed by  . [START ON 05/10/2015] LORazepam  0-4 mg Intravenous Q12H  . multivitamin with minerals  1 tablet Oral Daily  . multivitamin with minerals  1 tablet Oral Daily  . simvastatin  20 mg Oral Daily  . thiamine  100 mg Oral Daily   Or  . thiamine  100 mg Intravenous Daily   Continuous Infusions: . sodium chloride      Time spent: 35 minutes  [facility]  [email or web address], password [facility] 05/09/2015, 11:46 AM

## 2015-05-10 DIAGNOSIS — F1410 Cocaine abuse, uncomplicated: Secondary | ICD-10-CM

## 2015-05-10 LAB — HEMOGLOBIN A1C
HEMOGLOBIN A1C: 5.1 % (ref 4.8–5.6)
MEAN PLASMA GLUCOSE: 100 mg/dL

## 2015-05-10 NOTE — Progress Notes (Addendum)
[facility] PROGRESS NOTE  Andre [name] [id number] DOB: 1950-06-08 DOA: 05/08/2015 PCP: No primary care provider on file.  Assessment/Plan:  Principal Problem:   Acute ischemic stroke, right pons and medulla with history of multiple previous strokes: Workup underway. CTA neck and head noted.LDL 52. Echocardiogram without source of embolus. hgb a1c 5.1. Due to diffuse IC stenosis, ASA and plavix for 3 months then plavix alone. PT, OT rec CIR. Await decision Active Problems:   Polysubstance abuse including cocaine and alcohol, counseled against. Cont CIWA protocol. Will consult social work.   Essential hypertension: permissive hypertension   Code Status:  full Family Communication:  Friend at bedside Disposition Plan:  CIR  Consultants:  neurology  Procedures:     Antibiotics:    HPI/Subjective: Left sided weakness improving. Still with visual difficulty  Objective: Filed Vitals:   05/10/15 1019 05/10/15 1421  BP: 140/82 136/78  Pulse: 78 80  Temp: 98.3 F (36.8 C) 98 F (36.7 C)  Resp: 18 18    Intake/Output Summary (Last 24 hours) at 05/10/15 1654 Last data filed at 05/10/15 0518  Gross per 24 hour  Intake      0 ml  Output    800 ml  Net   -800 ml   Filed Weights   05/08/15 1353 05/08/15 1740  Weight: 89.812 kg (198 lb) 77.474 kg (170 lb 12.8 oz)    Exam:   General:  Alert, oriented. Has left eye patch on. Cooperative.  Cardiovascular: regular rate rhythm without murmurs gallops rubs  Respiratory: clear to auscultation bilaterally without wheezes rhonchi or rales  Abdomen: nontender nondistended  Ext: no clubbing cyanosis or edema. No tremulousness.  Neurologic: LUE strength improved 4/5. LLE strength improved 3/5  Basic Metabolic Panel:  Recent Labs Lab 05/08/15 1435 05/08/15 1444 05/09/15 0546  NA 138 138 140  K 4.1 3.9 3.9  CL 103 101 105  CO2 24  --  25  GLUCOSE 90 85 70  BUN 11 12 10   CREATININE 1.15 1.20 1.14   CALCIUM 9.0  --  8.9   Liver Function Tests:  Recent Labs Lab 05/08/15 1435  AST 21  ALT 18  ALKPHOS 53  BILITOT 1.2  PROT 6.8  ALBUMIN 3.6   No results for input(s): LIPASE, AMYLASE in the last 168 hours.  Recent Labs Lab 05/08/15 1604  AMMONIA 37*   CBC:  Recent Labs Lab 05/08/15 1435 05/08/15 1444 05/09/15 0546  WBC 8.7  --  6.9  NEUTROABS 6.5  --   --   HGB 14.2 16.3 13.1  HCT 43.7 48.0 40.8  MCV 90.9  --  91.5  PLT 206  --  203   Cardiac Enzymes: No results for input(s): CKTOTAL, CKMB, CKMBINDEX, TROPONINI in the last 168 hours. BNP (last 3 results) No results for input(s): BNP in the last 8760 hours.  ProBNP (last 3 results) No results for input(s): PROBNP in the last 8760 hours.  CBG:  Recent Labs Lab 05/08/15 1359  GLUCAP 94    Recent Results (from the past 240 hour(s))  Culture, blood (routine x 2)     Status: None (Preliminary result)   Collection Time: 05/08/15  4:04 PM  Result Value Ref Range Status   Specimen Description BLOOD RIGHT FOREARM  Final   Special Requests BOTTLES DRAWN AEROBIC AND ANAEROBIC 10CC  Final   Culture NO GROWTH 2 DAYS  Final   Report Status PENDING  Incomplete  Culture, blood (routine x 2)  Status: None (Preliminary result)   Collection Time: 05/08/15  4:11 PM  Result Value Ref Range Status   Specimen Description BLOOD RIGHT HAND  Final   Special Requests BOTTLES DRAWN AEROBIC AND ANAEROBIC 10CC  Final   Culture NO GROWTH 2 DAYS  Final   Report Status PENDING  Incomplete     Studies: Mr Brain Wo Contrast  05/08/2015  CLINICAL DATA:  65 year old hypertensive male with left-sided weakness and facial droop. Subsequent encounter. EXAM: MRI HEAD WITHOUT CONTRAST TECHNIQUE: Multiplanar, multiecho pulse sequences of the brain and surrounding structures were obtained without intravenous contrast. COMPARISON:  CT and CT angiogram 05/08/2015.  06/28/2008 brain MR. FINDINGS: Acute nonhemorrhagic small infarct at the  junction of the right pons and medulla. Remote moderate size infarct inferior left cerebellum. Remote bilateral basal ganglia and thalamic infarcts. Remote bilateral corona radiata infarcts extend across the corpus callosum. Tiny blood breakdown products right thalamus consistent with prior hemorrhagic ischemia otherwise no intracranial hemorrhage. Moderate small vessel disease type changes. Global atrophy without hydrocephalus. No intracranial mass lesion noted on this unenhanced exam. Major intracranial vascular structures appear patent. Degree of narrowing better assessed on recent CT angiogram. Cervical spondylotic changes with spinal stenosis and mild cord flattening C3-4. Cervical medullary junction, pituitary and pineal region within normal limits. Post lens replacement with mild exophthalmos. Paranasal sinus mucosal thickening most notable involving ethmoid sinus air cells. Nonspecific 2 cm subcutaneous mass superficial to the left masseter muscle. IMPRESSION: Acute nonhemorrhagic small infarct at the junction of the right pons and medulla. Remote moderate size infarct inferior left cerebellum. Remote bilateral basal ganglia and thalamic infarcts. Remote bilateral corona radiata infarcts extend across the corpus callosum. Moderate small vessel disease type changes. Global atrophy Major intracranial vascular structures appear patent. Degree of narrowing better assessed on recent CT angiogram. Cervical spondylotic changes with spinal stenosis and mild cord flattening C3-4. Cervical medullary junction, pituitary and pineal region within normal limits. Post lens replacement with mild exophthalmos. Paranasal sinus mucosal thickening most notable involving ethmoid sinus air cells. Nonspecific 2 cm subcutaneous mass superficial to the left masseter muscle. Electronically Signed   By: Genia Del M.D.   On: 05/08/2015 17:29    Scheduled Meds: . aspirin  325 mg Oral Daily  . clopidogrel  75 mg Oral Daily  .  enoxaparin (LOVENOX) injection  40 mg Subcutaneous Q24H  . folic acid  1 mg Oral Daily  . LORazepam  0-4 mg Intravenous Q6H   Followed by  . LORazepam  0-4 mg Intravenous Q12H  . multivitamin with minerals  1 tablet Oral Daily  . simvastatin  20 mg Oral Daily  . thiamine  100 mg Oral Daily   Continuous Infusions:    Time spent: 35 minutes  [facility]  [email or web address], password [facility] 05/10/2015, 4:54 PM  LOS: 1 day

## 2015-05-10 NOTE — Progress Notes (Signed)
Inpatient Rehabilitation  I met with the patient and his significant other Andre [name] at the bedside to discuss the recommendation of IP Rehab.  I answered their questions and provided informational booklets.  Pt. Would like to come to CIR for his rehab and  [name] is able to provide 24 hour care post rehab.  Will tentatively plan for CIR tomorrow if cleared by medical team.  My colleague Gunnar Fusi will follow up tomorrow. I have updated Lorne Skeens, RNCM and Glendon Axe, CSW.   Please call if questions.  [place] Admissions Coordinator Cell [phone] Office [phone]

## 2015-05-10 NOTE — Progress Notes (Signed)
Physical Therapy Treatment Patient Details Name: Andre [name] MRN: [id number] DOB: 11/14/1950 Today's Date: 05/10/2015    History of Present Illness 65 y.o. male admitted with left sided weakness.Acute ischemic stroke, right pons and medulla with history of multiple previous strokes: Workup underway. H/o polysubstance abuse.    PT Comments    Pt progressing towards physical therapy goals. Pt demonstrated increased active motion of the L upper and lower extremities this session. Continues to have difficulty with coordination of the LLE and noted significant L knee hyperextension during gait training. Will continue to follow and progress as able per POC.   Follow Up Recommendations  CIR;Supervision/Assistance - 24 hour     Equipment Recommendations  Rolling walker with 5" wheels;3in1 (PT)    Recommendations for Other Services Rehab consult     Precautions / Restrictions Precautions Precautions: Fall Restrictions Weight Bearing Restrictions: No    Mobility  Bed Mobility Overal bed mobility: Needs Assistance Bed Mobility: Supine to Sit     Supine to sit: Min guard     General bed mobility comments: Pt was able to transition to EOB without assistance this session. Increased cues required for pt to initiate scooting fully to EOB.   Transfers Overall transfer level: Needs assistance Equipment used: Rolling walker (2 wheeled) Transfers: Sit to/from Stand Sit to Stand: Min assist         General transfer comment: Steadying assist to power-up to full standing position. VC's for hand placement on seated surface for safety. Pt able to place LUE on walker without assistance this session.   Ambulation/Gait Ambulation/Gait assistance: Min assist Ambulation Distance (Feet): 200 Feet (100' x2) Assistive device: Rolling walker (2 wheeled) Gait Pattern/deviations: Step-through pattern;Decreased stride length;Trunk flexed Gait velocity: Decreased Gait velocity interpretation:  Below normal speed for age/gender General Gait Details: Noted significant L knee hyperextension during gait training. Occasional min assist required for balance and walker management. Pt reports dizziness with standing activity.    Stairs            Wheelchair Mobility    Modified Rankin (Stroke Patients Only) Modified Rankin (Stroke Patients Only) Pre-Morbid Rankin Score: No symptoms Modified Rankin: Moderately severe disability     Balance Overall balance assessment: Needs assistance Sitting-balance support: Feet supported;No upper extremity supported Sitting balance-[name]: Fair     Standing balance support: Bilateral upper extremity supported;During functional activity Standing balance-[name]: Poor                      Cognition Arousal/Alertness: Awake/alert Behavior During Therapy: WFL for tasks assessed/performed Overall Cognitive Status: Within Functional Limits for tasks assessed                      Exercises General Exercises - Lower Extremity Long Arc Quad: 10 reps;Left    General Comments        Pertinent Vitals/Pain Pain Assessment: No/denies pain    Home Living                      Prior Function            PT Goals (current goals can now be found in the care plan section) Acute Rehab PT Goals Patient Stated Goal: Get stronger PT Goal Formulation: With patient/family Time For Goal Achievement: 05/23/15 Potential to Achieve Goals: Good Progress towards PT goals: Progressing toward goals    Frequency  Min 4X/week    PT Plan Current plan remains  appropriate    Co-evaluation             End of Session Equipment Utilized During Treatment: Gait belt Activity Tolerance: Patient tolerated treatment well;Treatment limited secondary to medical complications (Comment) (Dizziness) Patient left: in chair;with call bell/phone within reach;with chair alarm set     Time: [id number] PT Time Calculation  (min) (ACUTE ONLY): 15 min  Charges:  $Gait Training: 8-22 mins                    G Codes:      Rolinda Roan 05/29/15, 11:14 AM   Rolinda Roan, PT, DPT Acute Rehabilitation Services Pager: [phone]

## 2015-05-11 ENCOUNTER — Inpatient Hospital Stay (HOSPITAL_COMMUNITY)
Admission: RE | Admit: 2015-05-11 | Discharge: 2015-05-22 | DRG: 057 | Disposition: A | Discharge status: Home Health | Payer: Medicare Other | Source: Intra-hospital | Attending: Physical Medicine & Rehabilitation | Admitting: Physical Medicine & Rehabilitation

## 2015-05-11 DIAGNOSIS — J069 Acute upper respiratory infection, unspecified: Secondary | ICD-10-CM | POA: Diagnosis not present

## 2015-05-11 DIAGNOSIS — F17210 Nicotine dependence, cigarettes, uncomplicated: Secondary | ICD-10-CM

## 2015-05-11 DIAGNOSIS — I69354 Hemiplegia and hemiparesis following cerebral infarction affecting left non-dominant side: Secondary | ICD-10-CM | POA: Diagnosis present

## 2015-05-11 DIAGNOSIS — G8194 Hemiplegia, unspecified affecting left nondominant side: Secondary | ICD-10-CM

## 2015-05-11 DIAGNOSIS — F1410 Cocaine abuse, uncomplicated: Secondary | ICD-10-CM

## 2015-05-11 DIAGNOSIS — I63331 Cerebral infarction due to thrombosis of right posterior cerebral artery: Secondary | ICD-10-CM

## 2015-05-11 DIAGNOSIS — Z79899 Other long term (current) drug therapy: Secondary | ICD-10-CM | POA: Diagnosis not present

## 2015-05-11 DIAGNOSIS — I69393 Ataxia following cerebral infarction: Secondary | ICD-10-CM

## 2015-05-11 DIAGNOSIS — R350 Frequency of micturition: Secondary | ICD-10-CM

## 2015-05-11 DIAGNOSIS — E785 Hyperlipidemia, unspecified: Secondary | ICD-10-CM | POA: Diagnosis not present

## 2015-05-11 DIAGNOSIS — I69398 Other sequelae of cerebral infarction: Secondary | ICD-10-CM

## 2015-05-11 DIAGNOSIS — I69392 Facial weakness following cerebral infarction: Secondary | ICD-10-CM

## 2015-05-11 DIAGNOSIS — I10 Essential (primary) hypertension: Secondary | ICD-10-CM | POA: Diagnosis present

## 2015-05-11 DIAGNOSIS — H532 Diplopia: Secondary | ICD-10-CM

## 2015-05-11 DIAGNOSIS — K5900 Constipation, unspecified: Secondary | ICD-10-CM | POA: Diagnosis not present

## 2015-05-11 DIAGNOSIS — H539 Unspecified visual disturbance: Secondary | ICD-10-CM | POA: Diagnosis not present

## 2015-05-11 DIAGNOSIS — I69319 Unspecified symptoms and signs involving cognitive functions following cerebral infarction: Secondary | ICD-10-CM

## 2015-05-11 DIAGNOSIS — F1010 Alcohol abuse, uncomplicated: Secondary | ICD-10-CM | POA: Diagnosis not present

## 2015-05-11 MED ORDER — ACETAMINOPHEN 325 MG PO TABS: 325.0000 mg | ORAL_TABLET | ORAL | Status: DC | PRN

## 2015-05-11 MED ORDER — PROCHLORPERAZINE EDISYLATE 5 MG/ML IJ SOLN: 5.0000 mg | Freq: Four times a day (QID) | INTRAMUSCULAR | Status: DC | PRN

## 2015-05-11 MED ORDER — FLUTICASONE PROPIONATE 50 MCG/ACT NA SUSP
2.0000 | Freq: Every day | NASAL | Status: AC
  Administered 2015-05-13 – 2015-05-14 (×2): 2 via NASAL
  Filled 2015-05-11 (×2): qty 16

## 2015-05-11 MED ORDER — FLUTICASONE PROPIONATE 50 MCG/ACT NA SUSP
2.0000 | Freq: Every day | NASAL | Status: DC | PRN
  Filled 2015-05-11: qty 16

## 2015-05-11 MED ORDER — LORAZEPAM 2 MG/ML IJ SOLN: 0.0000 mg | Freq: Two times a day (BID) | INTRAMUSCULAR | Status: AC

## 2015-05-11 MED ORDER — ALBUTEROL SULFATE HFA 108 (90 BASE) MCG/ACT IN AERS: 2.0000 | INHALATION_SPRAY | Freq: Four times a day (QID) | RESPIRATORY_TRACT | Status: DC | PRN

## 2015-05-11 MED ORDER — TRAZODONE HCL 50 MG PO TABS: 25.0000 mg | ORAL_TABLET | Freq: Every evening | ORAL | Status: DC | PRN

## 2015-05-11 MED ORDER — SENNOSIDES-DOCUSATE SODIUM 8.6-50 MG PO TABS
2.0000 | ORAL_TABLET | Freq: Every day | ORAL | Status: DC
  Administered 2015-05-11 – 2015-05-16 (×6): 2 via ORAL
  Filled 2015-05-11 (×6): qty 2

## 2015-05-11 MED ORDER — GUAIFENESIN-DM 100-10 MG/5ML PO SYRP: 5.0000 mL | ORAL_SOLUTION | Freq: Four times a day (QID) | ORAL | Status: DC | PRN

## 2015-05-11 MED ORDER — GUAIFENESIN-CODEINE 100-10 MG/5ML PO SOLN: 5.0000 mL | Freq: Three times a day (TID) | ORAL | Status: DC | PRN

## 2015-05-11 MED ORDER — ENOXAPARIN SODIUM 40 MG/0.4ML ~~LOC~~ SOLN
40.0000 mg | SUBCUTANEOUS | Status: DC
  Administered 2015-05-11 – 2015-05-21 (×11): 40 mg via SUBCUTANEOUS
  Filled 2015-05-11 (×11): qty 0.4

## 2015-05-11 MED ORDER — ADULT MULTIVITAMIN W/MINERALS CH
1.0000 | ORAL_TABLET | Freq: Every day | ORAL | Status: DC
  Administered 2015-05-12 – 2015-05-22 (×11): 1 via ORAL
  Filled 2015-05-11 (×11): qty 1

## 2015-05-11 MED ORDER — DM-GUAIFENESIN ER 30-600 MG PO TB12
1.0000 | ORAL_TABLET | Freq: Two times a day (BID) | ORAL | Status: DC
  Administered 2015-05-11 – 2015-05-22 (×22): 1 via ORAL
  Filled 2015-05-11 (×22): qty 1

## 2015-05-11 MED ORDER — FOLIC ACID 1 MG PO TABS
1.0000 mg | ORAL_TABLET | Freq: Every day | ORAL | Status: DC
  Administered 2015-05-12 – 2015-05-22 (×11): 1 mg via ORAL
  Filled 2015-05-11 (×11): qty 1

## 2015-05-11 MED ORDER — POLYETHYLENE GLYCOL 3350 17 G PO PACK
17.0000 g | PACK | Freq: Once | ORAL | Status: AC
  Administered 2015-05-11: 17 g via ORAL
  Filled 2015-05-11: qty 1

## 2015-05-11 MED ORDER — FLEET ENEMA 7-19 GM/118ML RE ENEM: 1.0000 | ENEMA | Freq: Once | RECTAL | Status: DC | PRN

## 2015-05-11 MED ORDER — DIPHENHYDRAMINE HCL 12.5 MG/5ML PO ELIX: 12.5000 mg | ORAL_SOLUTION | Freq: Four times a day (QID) | ORAL | Status: DC | PRN

## 2015-05-11 MED ORDER — SIMVASTATIN 20 MG PO TABS
20.0000 mg | ORAL_TABLET | Freq: Every day | ORAL | Status: DC
  Administered 2015-05-12 – 2015-05-22 (×11): 20 mg via ORAL
  Filled 2015-05-11 (×11): qty 1

## 2015-05-11 MED ORDER — ASPIRIN EC 81 MG PO TBEC
81.0000 mg | DELAYED_RELEASE_TABLET | Freq: Every day | ORAL | Status: DC
  Administered 2015-05-12 – 2015-05-22 (×11): 81 mg via ORAL
  Filled 2015-05-11 (×11): qty 1

## 2015-05-11 MED ORDER — ASPIRIN 325 MG PO TABS: 325.0000 mg | ORAL_TABLET | Freq: Every day | ORAL | Status: DC

## 2015-05-11 MED ORDER — ALUM & MAG HYDROXIDE-SIMETH 200-200-20 MG/5ML PO SUSP: 30.0000 mL | ORAL | Status: DC | PRN

## 2015-05-11 MED ORDER — PROCHLORPERAZINE 25 MG RE SUPP: 12.5000 mg | Freq: Four times a day (QID) | RECTAL | Status: DC | PRN

## 2015-05-11 MED ORDER — VITAMIN B-1 100 MG PO TABS
100.0000 mg | ORAL_TABLET | Freq: Every day | ORAL | Status: DC
  Administered 2015-05-12 – 2015-05-22 (×11): 100 mg via ORAL
  Filled 2015-05-11 (×11): qty 1

## 2015-05-11 MED ORDER — CLOPIDOGREL BISULFATE 75 MG PO TABS: 75.0000 mg | ORAL_TABLET | Freq: Every day | ORAL | Status: DC

## 2015-05-11 MED ORDER — PROCHLORPERAZINE MALEATE 5 MG PO TABS: 5.0000 mg | ORAL_TABLET | Freq: Four times a day (QID) | ORAL | Status: DC | PRN

## 2015-05-11 MED ORDER — BISACODYL 10 MG RE SUPP
10.0000 mg | Freq: Every day | RECTAL | Status: DC | PRN
  Filled 2015-05-11: qty 1

## 2015-05-11 MED ORDER — CLOPIDOGREL BISULFATE 75 MG PO TABS
75.0000 mg | ORAL_TABLET | Freq: Every day | ORAL | Status: DC
  Administered 2015-05-12 – 2015-05-22 (×11): 75 mg via ORAL
  Filled 2015-05-11 (×11): qty 1

## 2015-05-11 NOTE — Progress Notes (Signed)
Inpatient Rehabilitation  I have received MD clearance and insurance approval for IP Rehab admission today.  Patient, RN CM and CSW was notified.  Please call with questions.   Thanks,  Gunnar Fusi, M.A., CCC/SLP Admission Coordinator  [place]  Cell [phone]

## 2015-05-11 NOTE — Progress Notes (Signed)
Ankit Lorie Phenix, MD Physician Signed Physical Medicine and Rehabilitation Consult Note 05/09/2015 2:47 PM  Related encounter: ED to Hosp-Admission (Current) from 05/08/2015 in [place] All Collapse All        Physical Medicine and Rehabilitation Consult  Reason for Consult: Left sided weakness, visual deficits and left facial droop Referring Physician: Dr. Janann Colonel   HPI: Andre [name] is a 65 y.o. male with history of CVA with mild LLE weakness, polysubstance abuse who was admitted on 05/08/15 with left sided weakness and left facial droop. Patient had been out drinking with friends the night before and woke up the next day with worsening of left sided weakness. UDS positive for cocaine. CT head negative and CTA head/neck showed 60% stenosis proximal R-ICA and 50% stenosis L-ICA, moderate stenosis distal B-Va and mid basilar artery. MRI brain done revealing acute non-hemorrhagic infarct in right pons and medulla. 2 D echo with EF 50-55% and no wall abnormality. EEG negative for seizure/normal study. [name] consulted and felt that stroke due to small vessel disease and work up underway. ST evaluation with moderate mixed dysarthria and mild oral dysphagia---on dysphagia 3, thin liquids. Patient with resultant left sided weakness with sensory deficits, visual deficits with diplopia, and dizziness affecting ability to carry out self care tasks as well as mobility.  Review of Systems  HENT: Negative for hearing loss.  Eyes: Positive for blurred vision and double vision.  Respiratory: Positive for cough (recent cold). Negative for shortness of breath and wheezing.  Cardiovascular: Negative for chest pain and palpitations.  Gastrointestinal: Negative for heartburn, nausea and abdominal pain.  Genitourinary: Negative for dysuria.  Musculoskeletal: Negative for myalgias.  Neurological: Positive for sensory change, focal weakness and weakness.  Negative for dizziness and tingling.  Psychiatric/Behavioral: The patient does not have insomnia.  All other systems reviewed and are negative.  Past Medical History  Diagnosis Date  . Hypertension   . Stroke [facility])     Past Surgical History  Procedure Laterality Date  . Abdominal surgery      Family History  Problem Relation Age of Onset  . Hypertension Mother   . Hypertension Father     Social History: Widowed-lives with girlfriend who has health issues but can provide some supervision after discharge. Disabled since [place] 2012. Independent with cane PTA. He reports that he has been smoking--about 5 cigarettes daily. He does not have any smokeless tobacco history on file. He reports that he drinks beer daily--4 40 ounces over New Years day. He denies illicit drug use.   Allergies: No Known Allergies   Medications Prior to Admission  Medication Sig Dispense Refill  . lisinopril (PRINIVIL,ZESTRIL) 10 MG tablet Take 10 mg by mouth daily.  0  . Multiple Vitamins-Minerals (MULTIVITAMINS THER. W/MINERALS) TABS Take 1 tablet by mouth daily. 30 each 0  . simvastatin (ZOCOR) 20 MG tablet Take 20 mg by mouth daily.  0  . amLODipine (NORVASC) 10 MG tablet Take 1 tablet (10 mg total) by mouth daily. 30 tablet 0  . guaiFENesin-codeine (ROBITUSSIN AC) 100-10 MG/5ML syrup Take 5 mLs by mouth 3 (three) times daily as needed for cough. (Patient not taking: Reported on 05/08/2015) 120 mL 0  . metoprolol (LOPRESSOR) 100 MG tablet Take 100 mg by mouth 2 (two) times daily.      Home: Home Living Family/patient expects to be discharged to:: Private residence Living Arrangements: Spouse/significant other Type of Home: Apartment Home Access:  Elevator Home Layout: One level Bathroom Shower/Tub: Producer, television/film/video: Kasandra Knudsen - single point Lives With: Significant other, Other (Comment) (20 years per significant other's  report with pt present)  Functional History: Prior Function Level of Independence: Independent with assistive device(s) Comments: cane for ambulation, no assist ADLs Functional Status:  Mobility: Bed Mobility Overal bed mobility: Needs Assistance Bed Mobility: Supine to Sit Supine to sit: Mod assist General bed mobility comments: Assist to elevate trunk to full sitting position and scoot so feet were resting on floor. Pt using RLE to move LLE off EOB.  Transfers Overall transfer level: Needs assistance Equipment used: Rolling walker (2 wheeled) Transfers: Sit to/from Stand Sit to Stand: Mod assist General transfer comment: Assist to power-up to full standing from low EOB. Assist to place LUE on the walker and pt pushed up from bed on the R side.  Ambulation/Gait General Gait Details: Unable at this time. Will need +2 to progress to gait training.    ADL: ADL Overall ADL's : Needs assistance/impaired Eating/Feeding: Set up, Sitting Grooming: Moderate assistance, Sitting Upper Body Bathing: Moderate assistance, Sitting Lower Body Bathing: Maximal assistance, Sit to/from stand Upper Body Dressing : Moderate assistance, Sitting Lower Body Dressing: Maximal assistance, Sit to/from stand Toilet Transfer: Maximal assistance, Stand-pivot, BSC Toileting- Clothing Manipulation and Hygiene: Maximal assistance, Sit to/from stand Tub/ Shower Transfer: Maximal assistance, Stand-pivot, 3 in 1 General ADL Comments: Pt completed sit<>stand from recliner with mod-max A. Pt stood for about 1 minute. Dizziness reported as pt leaned forward in preparation to stand and lasted until pt sat back in recliner again.   Cognition: Cognition Overall Cognitive Status: Within Functional Limits for tasks assessed Arousal/Alertness: Awake/alert Orientation Level: Oriented X4 Attention: Sustained, Selective Sustained Attention: Appears intact Selective Attention: Appears intact Memory: Impaired Memory  Impairment: Retrieval deficit (pt recalled 3/5 words independently within 5 minutes, 2 with cues ) Awareness: Appears intact (aware to need to call for assist to prevent falls due to left sided weakness) Safety/Judgment: Appears intact Comments: abstract thinking deficits noted with pt able to identify basic similarities between items eg: food vs fruit, noise maker vs musical instruments Cognition Arousal/Alertness: Awake/alert Behavior During Therapy: WFL for tasks assessed/performed Overall Cognitive Status: Within Functional Limits for tasks assessed   Blood pressure 137/85, pulse 71, temperature 98.4 F (36.9 C), temperature source Oral, resp. rate 20, height 5\' 6"  (1.676 m), weight 77.474 kg (170 lb 12.8 oz), SpO2 97 %. Physical Exam  Nursing note and vitals reviewed. Constitutional: He is oriented to person, place, and time. He appears well-nourished.  HENT:  Head: Normocephalic and atraumatic.  Eyes: Right conjunctiva is injected. Left conjunctiva is injected. Right eye exhibits abnormal extraocular motion. Left eye exhibits abnormal extraocular motion.  Neck: Normal range of motion. Neck supple.  Cardiovascular: Normal rate and regular rhythm.  No murmur heard. Respiratory: Effort normal and breath sounds normal. No respiratory distress. He exhibits no tenderness.  GI: Soft. Bowel sounds are normal. He exhibits no distension. There is no tenderness.  Musculoskeletal: He exhibits no edema or tenderness.  Neurological: He is alert and oriented to person, place, and time.  Soft voice with mild dysarthria.  Right inattention.  Left eye abducted and unable to turn right eye laterally.  ?tongue deviation to left Occasional wet voice.  Able to follow basic one and two step motor commands. Motor RUE/RLE 4+-5/5.  LUE: 3+/5 proximal to distal, apraxia LLE: 1/5 hip flexion, 3/5 ankle dorsi/plantar flexion Sensation diminished to light touch LUE/LLE  3+ DTR LUE Left hemiparesis with  component of apraxia. Left sensory deficits.  Skin: Skin is warm and dry.  Psychiatric: He has a normal mood and affect. His behavior is normal.     Lab Results Last 24 Hours    Results for orders placed or performed during the hospital encounter of 05/08/15 (from the past 24 hour(s))  Ammonia Status: Abnormal   Collection Time: 05/08/15 4:04 PM  Result Value Ref Range   Ammonia 37 (H) 9 - 35 umol/L  TSH Status: None   Collection Time: 05/08/15 4:04 PM  Result Value Ref Range   TSH 0.495 0.350 - 4.500 uIU/mL  Ethanol Status: None   Collection Time: 05/08/15 4:04 PM  Result Value Ref Range   Alcohol, Ethyl (B) <5 <5 mg/dL  Culture, blood (routine x 2) Status: None (Preliminary result)   Collection Time: 05/08/15 4:04 PM  Result Value Ref Range   Specimen Description BLOOD RIGHT FOREARM    Special Requests BOTTLES DRAWN AEROBIC AND ANAEROBIC 10CC    Culture NO GROWTH < 24 HOURS    Report Status PENDING   Culture, blood (routine x 2) Status: None (Preliminary result)   Collection Time: 05/08/15 4:11 PM  Result Value Ref Range   Specimen Description BLOOD RIGHT HAND    Special Requests BOTTLES DRAWN AEROBIC AND ANAEROBIC 10CC    Culture NO GROWTH < 24 HOURS    Report Status PENDING   Urine rapid drug screen (hosp performed)not at [facility] Status: Abnormal   Collection Time: 05/08/15 4:29 PM  Result Value Ref Range   Opiates NONE DETECTED NONE DETECTED   Cocaine POSITIVE (A) NONE DETECTED   Benzodiazepines NONE DETECTED NONE DETECTED   Amphetamines NONE DETECTED NONE DETECTED   Tetrahydrocannabinol NONE DETECTED NONE DETECTED   Barbiturates NONE DETECTED NONE DETECTED  Urinalysis, Routine w reflex microscopic (not at [facility]) Status: Abnormal   Collection Time: 05/08/15 4:30 PM  Result Value Ref Range   Color, Urine YELLOW YELLOW    APPearance CLEAR CLEAR   Specific Gravity, Urine >1.046 (H) 1.005 - 1.030   pH 5.0 5.0 - 8.0   Glucose, UA NEGATIVE NEGATIVE mg/dL   Hgb urine dipstick NEGATIVE NEGATIVE   Bilirubin Urine NEGATIVE NEGATIVE   Ketones, ur 15 (A) NEGATIVE mg/dL   Protein, ur NEGATIVE NEGATIVE mg/dL   Nitrite NEGATIVE NEGATIVE   Leukocytes, UA NEGATIVE NEGATIVE  Lipid panel Status: Abnormal   Collection Time: 05/09/15 5:46 AM  Result Value Ref Range   Cholesterol 137 0 - 200 mg/dL   Triglycerides 198 (H) <150 mg/dL   HDL 45 >40 mg/dL   Total CHOL/HDL Ratio 3.0 RATIO   VLDL 40 0 - 40 mg/dL   LDL Cholesterol 52 0 - 99 mg/dL  Basic metabolic panel Status: None   Collection Time: 05/09/15 5:46 AM  Result Value Ref Range   Sodium 140 135 - 145 mmol/L   Potassium 3.9 3.5 - 5.1 mmol/L   Chloride 105 101 - 111 mmol/L   CO2 25 22 - 32 mmol/L   Glucose, Bld 70 65 - 99 mg/dL   BUN 10 6 - 20 mg/dL   Creatinine, Ser 1.14 0.61 - 1.24 mg/dL   Calcium 8.9 8.9 - 10.3 mg/dL   GFR calc non Af Amer >60 >60 mL/min   GFR calc Af Amer >60 >60 mL/min   Anion gap 10 5 - 15  CBC Status: None   Collection Time: 05/09/15 5:46 AM  Result Value Ref Range  WBC 6.9 4.0 - 10.5 K/uL   RBC 4.46 4.22 - 5.81 MIL/uL   Hemoglobin 13.1 13.0 - 17.0 g/dL   HCT 40.8 39.0 - 52.0 %   MCV 91.5 78.0 - 100.0 fL   MCH 29.4 26.0 - 34.0 pg   MCHC 32.1 30.0 - 36.0 g/dL   RDW 12.5 11.5 - 15.5 %   Platelets 203 150 - 400 K/uL      Imaging Results (Last 48 hours)    Ct Angio Head W/cm &/or Wo Cm  05/08/2015 CLINICAL DATA: Left-sided weakness EXAM: CT ANGIOGRAPHY HEAD AND NECK TECHNIQUE: Multidetector CT imaging of the head and neck was performed using the standard protocol during bolus administration of intravenous contrast. Multiplanar CT image reconstructions and  MIPs were obtained to evaluate the vascular anatomy. Carotid stenosis measurements (when applicable) are obtained utilizing NASCET criteria, using the distal internal carotid diameter as the denominator. CONTRAST: 149mL OMNIPAQUE IOHEXOL 350 MG/ML SOLN COMPARISON: CT head today and 03/28/2011 FINDINGS: CTA NECK Aortic arch: Proximal great vessels patent with mild atherosclerotic calcification at the origin of the innominate artery. Mild apical emphysema bilaterally. Right carotid system: Right common carotid artery widely patent. Calcified plaque in the right carotid bulb. 60% diameter stenosis right internal carotid artery. Right external carotid artery widely patent. Remainder of the right internal carotid artery widely patent. Left carotid system: Left common carotid artery widely patent. Atherosclerotic calcified plaque at the carotid bulb on the left with 50% diameter stenosis. Left external carotid artery widely patent. Left internal carotid artery widely patent. Vertebral arteries:Both vertebral arteries patent to the basilar. No proximal stenosis. Moderate stenosis distal vertebral artery bilaterally due to calcific plaque. Skeleton: Moderate to advanced cervical spondylosis. No fracture or bony lesion. Other neck: Multiple subcutaneous cysts are present in the face bilaterally. These are mostly fluid filled however several also contain gas. 15 mm submandibular nodule on the right contains gas. Larger nodule anterior to the left parotid gland measures 26 x 17 mm and contains gas. Surrounding subcutaneous fat does not show significant stranding. CTA HEAD Anterior circulation: Atherosclerotic calcification in the cavernous carotid bilaterally with moderate stenosis bilaterally, left greater than right. Moderate stenosis distal left M1 segment. Hypoplastic left A1 segment. Both anterior cerebral arteries supplied from the right and are patent. Right middle cerebral artery is patent with mild stenosis of the  parietal branch of the left middle cerebral artery. Posterior circulation: Moderate stenosis distal vertebral artery due to calcific plaque. Moderate stenosis mid basilar. AICA patent bilaterally. Superior cerebellar and posterior cerebral arteries patent bilaterally. Fetal origin left posterior cerebral artery. Hypoplastic left P1 segment. Venous sinuses: Patent Anatomic variants: Negative for aneurysm Delayed phase: Normal enhancement on delayed imaging. IMPRESSION: No acute intracranial abnormality. Separate CT head results. See separate CT perfusion results. 60% diameter stenosis proximal right internal carotid artery. 50% diameter stenosis proximal left internal carotid artery. Moderate stenosis through the carotid siphon bilaterally. Moderate stenosis distal vertebral artery bilaterally. Moderate stenosis mid basilar artery. Moderate stenosis distal left M1 segment. Mild stenosis right middle cerebral artery branch to the parietal lobe. Multiple subcutaneous cysts in the face containing gas. These may represent infected dermal cysts. Critical Value/emergent results were called by telephone at the time of interpretation on 05/08/2015 at 3:45 pm to Dr. Faythe Dingwall, who verbally acknowledged these results. Electronically Signed By: Franchot Gallo M.D. On: 05/08/2015 15:57   Dg Chest 2 View  05/08/2015 CLINICAL DATA: Cough for 2 days EXAM: CHEST 2 VIEW COMPARISON: Chest radiograph and chest CT  May 16, 2012 FINDINGS: There is no edema or consolidation. Heart is borderline enlarged with pulmonary vascularity within normal limits. No adenopathy. No bone lesions. IMPRESSION: Mild cardiac enlargement. No edema or consolidation. Electronically Signed By: Lowella Grip III M.D. On: 05/08/2015 14:51   Ct Head Wo Contrast  05/08/2015 CLINICAL DATA: Left-sided weakness. Stroke. EXAM: CT HEAD WITHOUT CONTRAST TECHNIQUE: Contiguous axial images were obtained from the base of the skull through the vertex  without intravenous contrast. COMPARISON: CT head 03/28/2011 FINDINGS: Mild atrophy. Negative for hydrocephalus Chronic microvascular ischemic change in the white matter. Chronic lacune in the left medial putamen. Chronic infarction left head of caudate. Chronic infarct left inferior cerebellum. Negative for acute infarct. Negative for acute hemorrhage or mass lesion. No shift of the midline structures. Atherosclerotic calcification of the carotid and vertebral arteries bilaterally Mucosal edema paranasal sinuses. Negative calvarium IMPRESSION: Atrophy and chronic ischemic change. No acute intracranial abnormality Critical Value/emergent results were called by telephone at the time of interpretation on 05/08/2015 at 3:45 pm to Dr. Faythe Dingwall , who verbally acknowledged these results. Electronically Signed By: Franchot Gallo M.D. On: 05/08/2015 15:59   Ct Angio Neck W/cm &/or Wo/cm  05/08/2015 CLINICAL DATA: Left-sided weakness EXAM: CT ANGIOGRAPHY HEAD AND NECK TECHNIQUE: Multidetector CT imaging of the head and neck was performed using the standard protocol during bolus administration of intravenous contrast. Multiplanar CT image reconstructions and MIPs were obtained to evaluate the vascular anatomy. Carotid stenosis measurements (when applicable) are obtained utilizing NASCET criteria, using the distal internal carotid diameter as the denominator. CONTRAST: 139mL OMNIPAQUE IOHEXOL 350 MG/ML SOLN COMPARISON: CT head today and 03/28/2011 FINDINGS: CTA NECK Aortic arch: Proximal great vessels patent with mild atherosclerotic calcification at the origin of the innominate artery. Mild apical emphysema bilaterally. Right carotid system: Right common carotid artery widely patent. Calcified plaque in the right carotid bulb. 60% diameter stenosis right internal carotid artery. Right external carotid artery widely patent. Remainder of the right internal carotid artery widely patent. Left carotid system: Left  common carotid artery widely patent. Atherosclerotic calcified plaque at the carotid bulb on the left with 50% diameter stenosis. Left external carotid artery widely patent. Left internal carotid artery widely patent. Vertebral arteries:Both vertebral arteries patent to the basilar. No proximal stenosis. Moderate stenosis distal vertebral artery bilaterally due to calcific plaque. Skeleton: Moderate to advanced cervical spondylosis. No fracture or bony lesion. Other neck: Multiple subcutaneous cysts are present in the face bilaterally. These are mostly fluid filled however several also contain gas. 15 mm submandibular nodule on the right contains gas. Larger nodule anterior to the left parotid gland measures 26 x 17 mm and contains gas. Surrounding subcutaneous fat does not show significant stranding. CTA HEAD Anterior circulation: Atherosclerotic calcification in the cavernous carotid bilaterally with moderate stenosis bilaterally, left greater than right. Moderate stenosis distal left M1 segment. Hypoplastic left A1 segment. Both anterior cerebral arteries supplied from the right and are patent. Right middle cerebral artery is patent with mild stenosis of the parietal branch of the left middle cerebral artery. Posterior circulation: Moderate stenosis distal vertebral artery due to calcific plaque. Moderate stenosis mid basilar. AICA patent bilaterally. Superior cerebellar and posterior cerebral arteries patent bilaterally. Fetal origin left posterior cerebral artery. Hypoplastic left P1 segment. Venous sinuses: Patent Anatomic variants: Negative for aneurysm Delayed phase: Normal enhancement on delayed imaging. IMPRESSION: No acute intracranial abnormality. Separate CT head results. See separate CT perfusion results. 60% diameter stenosis proximal right internal carotid artery. 50% diameter stenosis proximal left  internal carotid artery. Moderate stenosis through the carotid siphon bilaterally. Moderate stenosis  distal vertebral artery bilaterally. Moderate stenosis mid basilar artery. Moderate stenosis distal left M1 segment. Mild stenosis right middle cerebral artery branch to the parietal lobe. Multiple subcutaneous cysts in the face containing gas. These may represent infected dermal cysts. Critical Value/emergent results were called by telephone at the time of interpretation on 05/08/2015 at 3:45 pm to Dr. Faythe Dingwall, who verbally acknowledged these results. Electronically Signed By: Franchot Gallo M.D. On: 05/08/2015 15:57   Mr Brain Wo Contrast  05/08/2015 CLINICAL DATA: 65 year old hypertensive male with left-sided weakness and facial droop. Subsequent encounter. EXAM: MRI HEAD WITHOUT CONTRAST TECHNIQUE: Multiplanar, multiecho pulse sequences of the brain and surrounding structures were obtained without intravenous contrast. COMPARISON: CT and CT angiogram 05/08/2015. 06/28/2008 brain MR. FINDINGS: Acute nonhemorrhagic small infarct at the junction of the right pons and medulla. Remote moderate size infarct inferior left cerebellum. Remote bilateral basal ganglia and thalamic infarcts. Remote bilateral corona radiata infarcts extend across the corpus callosum. Tiny blood breakdown products right thalamus consistent with prior hemorrhagic ischemia otherwise no intracranial hemorrhage. Moderate small vessel disease type changes. Global atrophy without hydrocephalus. No intracranial mass lesion noted on this unenhanced exam. Major intracranial vascular structures appear patent. Degree of narrowing better assessed on recent CT angiogram. Cervical spondylotic changes with spinal stenosis and mild cord flattening C3-4. Cervical medullary junction, pituitary and pineal region within normal limits. Post lens replacement with mild exophthalmos. Paranasal sinus mucosal thickening most notable involving ethmoid sinus air cells. Nonspecific 2 cm subcutaneous mass superficial to the left masseter muscle. IMPRESSION: Acute  nonhemorrhagic small infarct at the junction of the right pons and medulla. Remote moderate size infarct inferior left cerebellum. Remote bilateral basal ganglia and thalamic infarcts. Remote bilateral corona radiata infarcts extend across the corpus callosum. Moderate small vessel disease type changes. Global atrophy Major intracranial vascular structures appear patent. Degree of narrowing better assessed on recent CT angiogram. Cervical spondylotic changes with spinal stenosis and mild cord flattening C3-4. Cervical medullary junction, pituitary and pineal region within normal limits. Post lens replacement with mild exophthalmos. Paranasal sinus mucosal thickening most notable involving ethmoid sinus air cells. Nonspecific 2 cm subcutaneous mass superficial to the left masseter muscle. Electronically Signed By: Genia Del M.D. On: 05/08/2015 17:29   Ct Cerebral Perfusion W/cm  05/08/2015 CLINICAL DATA: Left-sided weakness. Stroke. EXAM: CT CEREBRAL PERFUSION WITH CONTRAST TECHNIQUE: CT perfusion was obtained through the basal ganglia and cerebral hemispheres. Computer generator calculations of mean transit time, time to peak perfusion, and cerebral blood volume were obtained. CONTRAST: 134mL OMNIPAQUE IOHEXOL 350 MG/ML SOLN COMPARISON: CT head today FINDINGS: No areas of abnormal perfusion identified. Normal mean transit time. Normal cerebral blood volume. No evidence of infarct or acute ischemia. IMPRESSION: Negative CT perfusion Critical Value/emergent results were called by telephone at the time of interpretation on 05/08/2015 at 3:45 Pm to Dr. Faythe Dingwall , who verbally acknowledged these results. Electronically Signed By: Franchot Gallo M.D. On: 05/08/2015 16:02     Assessment/Plan: Diagnosis: non-hemorrhagic infarct in right pons and medulla Labs and images independently reviewed. Records reviewed and summated above. Stroke: Continue secondary stroke prophylaxis and Risk Factor  Modification listed below:  Antiplatelet therapy:  Blood Pressure Management: Continue current medication with prn's with permisive HTN per primary team Statin Agent:  Tobacco/cocaine abuse: Cont to counsel Left sided hemiparesis: fit for orthotics to prevent contractures (resting hand splint for day, wrist cock up splint at night, PRAFO, etc)  Motor recovery:  Fluoxetine  1. Does the need for close, 24 hr/day medical supervision in concert with the patient's rehab needs make it unreasonable for this patient to be served in a less intensive setting? Potentially 2. Co-Morbidities requiring supervision/potential complications: history of CVA with mild LLE weakness, polysubstance (cocaine, cigarettes, etoh - cont to counsel, CIWA, monitor for withdrawls), abuse, dysphagia (cont SLP, consider Vital Stim), HTN (monitor and provide prns in accordance with increased physical exertion and pain), 3. Due to safety, disease management, medication administration and patient education, does the patient require 24 hr/day rehab nursing? Yes 4. Does the patient require coordinated care of a physician, rehab nurse, PT (1-2 hrs/day, 5 days/week), OT (1-2 hrs/day, 5 days/week) and SLP (1-2 hrs/day, 5 days/week) to address physical and functional deficits in the context of the above medical diagnosis(es)? Yes Addressing deficits in the following areas: balance, endurance, locomotion, strength, transferring, dressing, toileting, speech, swallowing and psychosocial support 5. Can the patient actively participate in an intensive therapy program of at least 3 hrs of therapy per day at least 5 days per week? Yes 6. The potential for patient to make measurable gains while on inpatient rehab is excellent 7. Anticipated functional outcomes upon discharge from inpatient rehab are supervision and min assist with PT, supervision and min assist with OT, modified independent and supervision with SLP. 8. Estimated rehab  length of stay to reach the above functional goals is: 16-19 days. 9. Does the patient have adequate social supports and living environment to accommodate these discharge functional goals? Potentially 10. Anticipated D/C setting: Home 11. Anticipated post D/C treatments: HH therapy and Home excercise program 12. Overall Rehab/Functional Prognosis: good and fair  RECOMMENDATIONS: This patient's condition is appropriate for continued rehabilitative care in the following setting: CIR if pt has 24/7 support after discharge.  Patient has agreed to participate in recommended program. Yes Note that insurance prior authorization may be required for reimbursement for recommended care.  Comment: Rehab Admissions Coordinator to follow up  Delice Lesch, MD 05/09/2015       Revision History     Date/Time User Provider Type Action   05/09/2015 7:15 PM Ankit Lorie Phenix, MD Physician Sign   05/09/2015 6:26 PM Ankit Lorie Phenix, MD Physician Share   05/09/2015 4:19 PM Bary Leriche, PA-C Physician Assistant Share   View Details Report       Routing History     Date/Time From To Method   05/09/2015 7:15 PM Ankit Lorie Phenix, MD Ankit Lorie Phenix, MD In [facility] [place]

## 2015-05-11 NOTE — Interval H&P Note (Signed)
Andre [name] was admitted today to Inpatient Rehabilitation with the diagnosis of right ponto-medullary infarct.  The patient's history has been reviewed, patient examined, and there is no change in status.  Patient continues to be appropriate for intensive inpatient rehabilitation.  I have reviewed the patient's chart and labs.  Questions were answered to the patient's satisfaction. The PAPE has been reviewed and assessment remains appropriate.  Andre [name] T 05/11/2015, 4:27 PM

## 2015-05-11 NOTE — Progress Notes (Signed)
Gunnar Fusi Rehab Admission Coordinator Signed Physical Medicine and Rehabilitation PMR Pre-admission 05/11/2015 12:34 PM  Related encounter: ED to Hosp-Admission (Current) from 05/08/2015 in [place] Collapse All   PMR Admission Coordinator Pre-Admission Assessment  Patient: Andre [name] is an 65 y.o., male MRN: [id number] DOB: 02-Mar-1951 Height: 5\' 6"  (167.6 cm) Weight: 77.474 kg (170 lb 12.8 oz)  Insurance Information HMO: PPO: PCP: IPA: 80/20: OTHER:  PRIMARY: Medicaid [facility] Access Policy#: [id number] o Subscriber: Self CM Name: Phone#: Fax#:  Pre-Cert#: Employer: disabled not employed  Benefits: Phone #: Name:  Eff. Date: eligible 05/11/15 with coverage code [id number] Deduct: Out of Pocket Max:  Life Max:  CIR: SNF:  Outpatient: Co-Pay:  Home Health: Co-Pay:  DME: Co-Pay:  Providers:   Medicaid Application Date: Case Manager:  Disability Application Date: Case Worker:   Emergency Contact Information Contact Information    Name Relation Home Work Mobile   Briggs,Cleve Significant other [phone]     No name specified         Current Medical History  Patient Admitting Diagnosis: Non-hemorrhagic infarct in right pons and medulla  History of Present Illness: [name] is a 65 y.o. male with history of CVA with mild LLE weakness, polysubstance abuse who was admitted on 05/08/15 with left sided weakness and left facial droop. Patient had been out drinking with friends the night before and woke up the next day with worsening of left sided weakness. UDS positive for cocaine. CT head negative and CTA head/neck showed 60% stenosis proximal R-ICA and 50%  stenosis L-ICA, moderate stenosis distal B-Va and mid basilar artery. MRI brain done revealing acute non-hemorrhagic infarct in right pons and medulla. 2 D echo with EF 50-55% and no wall abnormality. EEG negative for seizure/normal study. [name] consulted and felt that stroke due to diffuse intracranial stenosis and recommended ASA/plavix for 3 months followed by Plavix alone. Patient continues to deny cocaine use and has been counseled on abstinence. Patient with resultant LLE instability, ataxic mixed dysarthria, mild oral dysphagia, sensory deficits, visual deficits with diplopia, and dizziness affecting ability to carry out self care tasks as well as mobility. CIR recommended by MD and rehab team for follow up therapy.   NIH Total: 11    Past Medical History  Past Medical History  Diagnosis Date  . Hypertension   . Stroke [facility])     Family History  family history includes Hypertension in his father and mother.  Prior Rehab/Hospitalizations:  Has the patient had major surgery during 100 days prior to admission? No  Current Medications   Current facility-administered medications:  . aspirin tablet 325 mg, 325 mg, Oral, Daily, Rosalin Hawking, MD, 325 mg at 05/11/15 1019 . clopidogrel (PLAVIX) tablet 75 mg, 75 mg, Oral, Daily, Ram Fuller Mandril, MD, 75 mg at 05/11/15 1019 . enoxaparin (LOVENOX) injection 40 mg, 40 mg, Subcutaneous, Q24H, Costin Karlyne Greenspan, MD, 40 mg at 05/10/15 1610 . folic acid (FOLVITE) tablet 1 mg, 1 mg, Oral, Daily, Costin Karlyne Greenspan, MD, 1 mg at 05/11/15 1019 . guaiFENesin-codeine 100-10 MG/5ML solution 5 mL, 5 mL, Oral, TID PRN, Delfina Redwood, MD . [EXPIRED] LORazepam (ATIVAN) injection 0-4 mg, 0-4 mg, Intravenous, Q6H, Stopped at 05/10/15 1103 **FOLLOWED BY** LORazepam (ATIVAN) injection 0-4 mg, 0-4 mg, Intravenous, Q12H, Caren Griffins, MD, 0 mg at 05/10/15 1756 . LORazepam (ATIVAN) tablet 1 mg, 1 mg, Oral, Q6H PRN **OR** LORazepam  (ATIVAN) injection  1 mg, 1 mg, Intravenous, Q6H PRN, Caren Griffins, MD . multivitamin with minerals tablet 1 tablet, 1 tablet, Oral, Daily, Costin Karlyne Greenspan, MD, 1 tablet at 05/11/15 1019 . senna-docusate (Senokot-S) tablet 1 tablet, 1 tablet, Oral, QHS PRN, Caren Griffins, MD . simvastatin (ZOCOR) tablet 20 mg, 20 mg, Oral, Daily, Costin Karlyne Greenspan, MD, 20 mg at 05/11/15 1019 . thiamine (VITAMIN B-1) tablet 100 mg, 100 mg, Oral, Daily, 100 mg at 05/11/15 1019 **OR** [DISCONTINUED] thiamine (B-1) injection 100 mg, 100 mg, Intravenous, Daily, Caren Griffins, MD, 100 mg at 05/08/15 2104  Patients Current Diet: DIET DYS 3 Room service appropriate?: Yes with Assist; Fluid consistency:: Thin Diet - low sodium heart healthy  Precautions / Restrictions Precautions Precautions: Fall Restrictions Weight Bearing Restrictions: No   Has the patient had 2 or more falls or a fall with injury in the past year?No until the fall that was associated with this CVA and admission   Prior Activity Level Community (5-7x/wk): Patient goes out into the community daily to go visit friends and gets there via bus.   Home Assistive Devices / Equipment Home Assistive Devices/Equipment: Cane (specify quad or straight) Home Equipment: Cane - single point  Prior Device Use: Indicate devices/aids used by the patient prior to current illness, exacerbation or injury? Single point cane when out in the community   Prior Functional Level Prior Function Level of Independence: Independent (reports using a single point cane when out of the house PTA) Comments: cane for ambulation, no assist ADLs  Self Care: Did the patient need help bathing, dressing, using the toilet or eating? Independent  Indoor Mobility: Did the patient need assistance with walking from room to room (with or without device)? Independent  Stairs: Did the patient need assistance with internal or external stairs (with or without device)?  Independent  Functional Cognition: Did the patient need help planning regular tasks such as shopping or remembering to take medications? Independent  Current Functional Level Cognition  Arousal/Alertness: Awake/alert Overall Cognitive Status: Within Functional Limits for tasks assessed Orientation Level: Oriented X4 Attention: Sustained, Selective Sustained Attention: Appears intact Selective Attention: Appears intact Memory: Impaired Memory Impairment: Retrieval deficit (pt recalled 3/5 words independently within 5 minutes, 2 with cues ) Awareness: Appears intact (aware to need to call for assist to prevent falls due to left sided weakness) Safety/Judgment: Appears intact Comments: abstract thinking deficits noted with pt able to identify basic similarities between items eg: food vs fruit, noise maker vs musical instruments   Extremity Assessment (includes Sensation/Coordination)  Upper Extremity Assessment: Defer to OT evaluation LUE Deficits / Details: Grossly 2-/5 although stronger distal than proximal. 3/5 distal with weak grasp noted.  LUE Sensation: decreased light touch LUE Coordination: decreased fine motor, decreased gross motor  Lower Extremity Assessment: LLE deficits/detail LLE Deficits / Details: Grossly <3/5 strength in LLE. Pt mostly using RLE tucked underneath L ankle to move the LLE. LLE Sensation: decreased light touch LLE Coordination: decreased fine motor, decreased gross motor    ADLs  Overall ADL's : Needs assistance/impaired Eating/Feeding: Set up, Sitting Grooming: Moderate assistance, Sitting Upper Body Bathing: Moderate assistance, Sitting Lower Body Bathing: Maximal assistance, Sit to/from stand Upper Body Dressing : Moderate assistance, Sitting Lower Body Dressing: Maximal assistance, Sit to/from stand Toilet Transfer: Maximal assistance, Stand-pivot, BSC Toileting- Clothing Manipulation and Hygiene: Maximal assistance, Sit to/from  stand Tub/ Shower Transfer: Maximal assistance, Stand-pivot, 3 in 1 General ADL Comments: Pt completed sit<>stand from recliner with mod-max A.  Pt stood for about 1 minute. Dizziness reported as pt leaned forward in preparation to stand and lasted until pt sat back in recliner again.     Mobility  Overal bed mobility: Needs Assistance Bed Mobility: Supine to Sit Supine to sit: Min guard General bed mobility comments: Pt was able to transition to EOB without assistance this session. Increased cues required for pt to initiate scooting fully to EOB.     Transfers  Overall transfer level: Needs assistance Equipment used: Rolling walker (2 wheeled) Transfers: Sit to/from Stand Sit to Stand: Min assist General transfer comment: Steadying assist to power-up to full standing position. VC's for hand placement on seated surface for safety. Pt able to place LUE on walker without assistance this session.     Ambulation / Gait / Stairs / Wheelchair Mobility  Ambulation/Gait Ambulation/Gait assistance: Museum/gallery curator (Feet): 200 Feet (100' x2) Assistive device: Rolling walker (2 wheeled) Gait Pattern/deviations: Step-through pattern, Decreased stride length, Trunk flexed General Gait Details: Noted significant L knee hyperextension during gait training. Occasional min assist required for balance and walker management. Pt reports dizziness with standing activity.  Gait velocity: Decreased Gait velocity interpretation: Below normal speed for age/gender    Posture / Balance Dynamic Sitting Balance Sitting balance - Comments: in unsupported seating briefly in preparation to stand, c/o dizziness Balance Overall balance assessment: Needs assistance Sitting-balance support: Feet supported, No upper extremity supported Sitting balance-[name]: Fair Sitting balance - Comments: in unsupported seating briefly in preparation to stand, c/o dizziness Standing balance support:  Bilateral upper extremity supported, During functional activity Standing balance-[name]: Poor Standing balance comment: Requires UE support to maintain standing balance.     Special needs/care consideration BiPAP/CPAP No CPM No  Continuous Drip IV No Dialysis No Days N/A Life Vest No Oxygen No Special Bed No  Trach Size N/A Wound Vac (area) N/A  Skin WDL Location N/A Bowel mgmt: reports being continent, last BM 05/07/15 Bladder mgmt:continent with use of urinal  Diabetic mgmt N/A     Previous Home Environment Living Arrangements: Spouse/significant other Lives With: Significant other, Other (Comment) (20 years per significant other's report with pt present) Type of Home: Apartment Home Layout: One level Home Access: Elevator Bathroom Shower/Tub: Tub/shower unit Home Care Services: No  Discharge Living Setting Plans for Discharge Living Setting: Patient's home, Other (Comment) (Significant others apartment ) Type of Home at Discharge: Apartment Discharge Home Layout: One level Discharge Home Access: Elevator Discharge Bathroom Shower/Tub: Tub/shower unit, Curtain Discharge Bathroom Toilet: Handicapped height (per patient report there is a device to elevate the toilet) Discharge Bathroom Accessibility: Yes How Accessible: Accessible via walker Does the patient have any problems obtaining your medications?: Yes (Describe) (transportation concerns now that he cannot take the bus)  Social/Family/Support Systems Patient Roles: Partner Contact Information: S/O: Violeta Gelinas [phone] Anticipated Caregiver: Significant Other Cleve Ability/Limitations of Caregiver: none  Caregiver Availability: 24/7 Is Caregiver In Agreement with Plan?: Yes Does Caregiver/Family have Issues with Lodging/Transportation while Pt is in Rehab?: No   Goals/Additional Needs Patient/Family Goal for Rehab: Supervision-Min assist PT/OT;  Supervision/Mod I SLP Expected length of stay: 16-19 days Cultural Considerations: None Dietary Needs: None reported PTA, currently on Dys.3 textures and thin liquids  Equipment Needs: TBD Special Service Needs: None Additional Information: None Pt/Family Agrees to Admission and willing to participate: Yes Program Orientation Provided & Reviewed with Pt/Caregiver Including Roles & Responsibilities: Yes Additional Information Needs: Patient concerned about transportation upon discharge such he will likely be  unable to ride the bus. Information Needs to be Provided By: Social Work   Decrease burden of Care through IP rehab admission: Not anticipated   Possible need for SNF placement upon discharge: Not anticipated   Patient Condition: This patient's condition remains as documented in the consult dated 05/09/15 @ 1915, in which the Rehabilitation Physician determined and documented that the patient's condition is appropriate for intensive rehabilitative care in an inpatient rehabilitation facility. Will admit to inpatient rehab today.  Preadmission Screen Completed By: Gunnar Fusi, 05/11/2015 1:38 PM ______________________________________________________________________  Discussed status with Dr. Naaman Plummer on 05/11/15 at 1340 and received telephone approval for admission today.  Admission Coordinator: Gunnar Fusi, time 1340/Date 05/11/15          Cosigned by: Meredith Staggers, MD at 05/11/2015 1:40 PM  Revision History     Date/Time User Provider Type Action   05/11/2015 1:40 PM Meredith Staggers, MD Physician Cosign   05/11/2015 1:39 PM Gunnar Fusi Rehab Admission Coordinator Sign

## 2015-05-11 NOTE — Progress Notes (Signed)
Pt discharged to CIR. Report called to [facility]. IV will remain due to IV Keppra. Telemetry removed. Pt to be transported via wheelchair by NT at 1610. Wendee Copp

## 2015-05-11 NOTE — Discharge Summary (Signed)
Physician Discharge Summary  Andre [name] [id number] DOB: 1959/06/23 DOA: 05/08/2015  PCP: No primary care provider on file.  Admit date: 05/08/2015 Discharge date: 05/11/2015  Time spent: greater than 30 minutes  Recommendations for Outpatient Follow-up:  To inpatient rehab Aspirin and plavix x 3 months then plavix alone F/u Dr. Lavera Guise 2 months  Discharge Diagnoses:  Principal Problem:   Acute ischemic stroke [facility]) Active Problems:   Polysubstance abuse   Essential hypertension   Tobacco use disorder   Cocaine abuse   HLD (hyperlipidemia)   ETOH abuse   History of CVA with residual deficit  Discharge Condition: stable  Diet recommendation: heart healthy  Filed Weights   05/08/15 1353 05/08/15 1740  Weight: 89.812 kg (198 lb) 77.474 kg (170 lb 12.8 oz)    History of present illness/Hospital Course:  65 y.o.male who is a poor historian. Patient states he was out drinking with his friends last night and did not go to bed until 0500 hours this AM. He awoke around 1400 hours and noted he could not move his left side and had a facial droop. Also with visual disturbance.He states he has some weakness from previous stroke but the symptoms have become much more pronounced. On physical examination, noted to have left-sided weakness, disconjugate gaze with left eye a lateral position and minimal movement right eye and mid position but with nystagmus.  Admitted to telemetry. MRI confirmed stroke. Neurology consulted and felt the right pontomedullary infarct was secondary to small vessel disease in the setting of hypertension and substance abuse. Multiple remote infarcts were noted on MRI. CT a of the head and neck showed diffuse cerebrovascular disease with right ICA stenosis 60% and left ICA stenosis 50%. Echocardiogram showed normal ejection fraction and no cardiac source of emboli. EEG was normal. LDL 52. Hemoglobin A1c 5.1. Patient work with physical therapy occupational therapy and  speech therapy. Inpatient rehabilitation was recommended and patient is stable for transfer. Neurology recommends stat due to diffuse intracranial stenosis, dual antiplatelet therapy with aspirin and Plavix is necessary for 3 months then Plavix alone. Social work was consult and for polysubstance abuse. Patient smokes cigarettes and had cocaine positive urine drug screen. Also reports history of heavy alcohol use. He was monitored for signs of withdrawal but had none. Antihypertensives were held in the setting of acute stroke but may be resumed. At discharge, he still has visual difficulties , helped by eye patch, but his left-sided weakness is slowly improving. Was on statin prior to admission and will be continued.  Procedures:  none  Consultations:  Neurology  Physical medicine and rehabilitation  Discharge Exam: Filed Vitals:   05/11/15 0536 05/11/15 1035  BP: 142/89 119/78  Pulse: 77 80  Temp: 98.4 F (36.9 C) 98.4 F (36.9 C)  Resp: 18 18    General: eating lunch in no acute distress. Alert and oriented Cardiovascular: regular rate rhythm without murmurs gallops rubs Respiratory: clear to auscultation bilaterally without wheezes rhonchi or rales Neurologic: Left upper extremity strength improving with about 4+/5. Left lower extremity strength about 3 out of 5.  Discharge Instructions   Discharge Instructions    Ambulatory referral to Neurology    Complete by:  As directed   An appointment is requested in approximately: 2 months     Diet - low sodium heart healthy    Complete by:  As directed           Current Discharge Medication List    START taking  these medications   Details  albuterol (PROVENTIL HFA;VENTOLIN HFA) 108 (90 Base) MCG/ACT inhaler Inhale 2 puffs into the lungs every 6 (six) hours as needed for wheezing (cough). Qty: 1 Inhaler, Refills: 2    aspirin 325 MG tablet Take 1 tablet (325 mg total) by mouth daily.    clopidogrel (PLAVIX) 75 MG tablet  Take 1 tablet (75 mg total) by mouth daily.      CONTINUE these medications which have NOT CHANGED   Details  lisinopril (PRINIVIL,ZESTRIL) 10 MG tablet Take 10 mg by mouth daily. Refills: 0    Multiple Vitamins-Minerals (MULTIVITAMINS THER. W/MINERALS) TABS Take 1 tablet by mouth daily. Qty: 30 each, Refills: 0    simvastatin (ZOCOR) 20 MG tablet Take 20 mg by mouth daily. Refills: 0    amLODipine (NORVASC) 10 MG tablet Take 1 tablet (10 mg total) by mouth daily. Qty: 30 tablet, Refills: 0    guaiFENesin-codeine (ROBITUSSIN AC) 100-10 MG/5ML syrup Take 5 mLs by mouth 3 (three) times daily as needed for cough. Qty: 120 mL, Refills: 0    metoprolol (LOPRESSOR) 100 MG tablet Take 100 mg by mouth 2 (two) times daily.       No Known Allergies Follow-up Information    Follow up with Xu,Jindong, MD In 2 months.   Specialty:  Neurology   Contact information:   [address] [place] [address] [phone]        The results of significant diagnostics from this hospitalization (including imaging, microbiology, ancillary and laboratory) are listed below for reference.    Significant Diagnostic Studies: Ct Angio Head W/cm &/or Wo Cm  05/08/2015  CLINICAL DATA:  Left-sided weakness EXAM: CT ANGIOGRAPHY HEAD AND NECK TECHNIQUE: Multidetector CT imaging of the head and neck was performed using the standard protocol during bolus administration of intravenous contrast. Multiplanar CT image reconstructions and MIPs were obtained to evaluate the vascular anatomy. Carotid stenosis measurements (when applicable) are obtained utilizing NASCET criteria, using the distal internal carotid diameter as the denominator. CONTRAST:  1102mL OMNIPAQUE IOHEXOL 350 MG/ML SOLN COMPARISON:  CT head today and 03/28/2011 FINDINGS: CTA NECK Aortic arch: Proximal great vessels patent with mild atherosclerotic calcification at the origin of the innominate artery. Mild apical emphysema bilaterally.  Right carotid system: Right common carotid artery widely patent. Calcified plaque in the right carotid bulb. 60% diameter stenosis right internal carotid artery. Right external carotid artery widely patent. Remainder of the right internal carotid artery widely patent. Left carotid system: Left common carotid artery widely patent. Atherosclerotic calcified plaque at the carotid bulb on the left with 50% diameter stenosis. Left external carotid artery widely patent. Left internal carotid artery widely patent. Vertebral arteries:Both vertebral arteries patent to the basilar. No proximal stenosis. Moderate stenosis distal vertebral artery bilaterally due to calcific plaque. Skeleton: Moderate to advanced cervical spondylosis. No fracture or bony lesion. Other neck: Multiple subcutaneous cysts are present in the face bilaterally. These are mostly fluid filled however several also contain gas. 15 mm submandibular nodule on the right contains gas. Larger nodule anterior to the left parotid gland measures 26 x 17 mm and contains gas. Surrounding subcutaneous fat does not show significant stranding. CTA HEAD Anterior circulation: Atherosclerotic calcification in the cavernous carotid bilaterally with moderate stenosis bilaterally, left greater than right. Moderate stenosis distal left M1 segment. Hypoplastic left A1 segment. Both anterior cerebral arteries supplied from the right and are patent. Right middle cerebral artery is patent with mild stenosis of the parietal branch  of the left middle cerebral artery. Posterior circulation: Moderate stenosis distal vertebral artery due to calcific plaque. Moderate stenosis mid basilar. AICA patent bilaterally. Superior cerebellar and posterior cerebral arteries patent bilaterally. Fetal origin left posterior cerebral artery. Hypoplastic left P1 segment. Venous sinuses: Patent Anatomic variants: Negative for aneurysm Delayed phase: Normal enhancement on delayed imaging. IMPRESSION:  No acute intracranial abnormality. Separate CT head results. See separate CT perfusion results. 60% diameter stenosis proximal right internal carotid artery. 50% diameter stenosis proximal left internal carotid artery. Moderate stenosis through the carotid siphon bilaterally. Moderate stenosis distal vertebral artery bilaterally. Moderate stenosis mid basilar artery. Moderate stenosis distal left M1 segment. Mild stenosis right middle cerebral artery branch to the parietal lobe. Multiple subcutaneous cysts in the face containing gas. These may represent infected dermal cysts. Critical Value/emergent results were called by telephone at the time of interpretation on 05/08/2015 at 3:45 pm to Dr. Faythe Dingwall, who verbally acknowledged these results. Electronically Signed   By: Franchot Gallo M.D.   On: 05/08/2015 15:57   Dg Chest 2 View  05/08/2015  CLINICAL DATA:  Cough for 2 days EXAM: CHEST  2 VIEW COMPARISON:  Chest radiograph and chest CT May 16, 2012 FINDINGS: There is no edema or consolidation. Heart is borderline enlarged with pulmonary vascularity within normal limits. No adenopathy. No bone lesions. IMPRESSION: Mild cardiac enlargement.  No edema or consolidation. Electronically Signed   By: Lowella Grip III M.D.   On: 05/08/2015 14:51   Ct Head Wo Contrast  05/08/2015  CLINICAL DATA:  Left-sided weakness.  Stroke. EXAM: CT HEAD WITHOUT CONTRAST TECHNIQUE: Contiguous axial images were obtained from the base of the skull through the vertex without intravenous contrast. COMPARISON:  CT head 03/28/2011 FINDINGS: Mild atrophy.  Negative for hydrocephalus Chronic microvascular ischemic change in the white matter. Chronic lacune in the left medial putamen. Chronic infarction left head of caudate. Chronic infarct left inferior cerebellum. Negative for acute infarct. Negative for acute hemorrhage or mass lesion. No shift of the midline structures. Atherosclerotic calcification of the carotid and vertebral  arteries bilaterally Mucosal edema paranasal sinuses.  Negative calvarium IMPRESSION: Atrophy and chronic ischemic change. No acute intracranial abnormality Critical Value/emergent results were called by telephone at the time of interpretation on 05/08/2015 at 3:45 pm to Dr. Faythe Dingwall , who verbally acknowledged these results. Electronically Signed   By: Franchot Gallo M.D.   On: 05/08/2015 15:59   Ct Angio Neck W/cm &/or Wo/cm  05/08/2015  CLINICAL DATA:  Left-sided weakness EXAM: CT ANGIOGRAPHY HEAD AND NECK TECHNIQUE: Multidetector CT imaging of the head and neck was performed using the standard protocol during bolus administration of intravenous contrast. Multiplanar CT image reconstructions and MIPs were obtained to evaluate the vascular anatomy. Carotid stenosis measurements (when applicable) are obtained utilizing NASCET criteria, using the distal internal carotid diameter as the denominator. CONTRAST:  162mL OMNIPAQUE IOHEXOL 350 MG/ML SOLN COMPARISON:  CT head today and 03/28/2011 FINDINGS: CTA NECK Aortic arch: Proximal great vessels patent with mild atherosclerotic calcification at the origin of the innominate artery. Mild apical emphysema bilaterally. Right carotid system: Right common carotid artery widely patent. Calcified plaque in the right carotid bulb. 60% diameter stenosis right internal carotid artery. Right external carotid artery widely patent. Remainder of the right internal carotid artery widely patent. Left carotid system: Left common carotid artery widely patent. Atherosclerotic calcified plaque at the carotid bulb on the left with 50% diameter stenosis. Left external carotid artery widely patent. Left internal carotid artery widely  patent. Vertebral arteries:Both vertebral arteries patent to the basilar. No proximal stenosis. Moderate stenosis distal vertebral artery bilaterally due to calcific plaque. Skeleton: Moderate to advanced cervical spondylosis. No fracture or bony lesion. Other  neck: Multiple subcutaneous cysts are present in the face bilaterally. These are mostly fluid filled however several also contain gas. 15 mm submandibular nodule on the right contains gas. Larger nodule anterior to the left parotid gland measures 26 x 17 mm and contains gas. Surrounding subcutaneous fat does not show significant stranding. CTA HEAD Anterior circulation: Atherosclerotic calcification in the cavernous carotid bilaterally with moderate stenosis bilaterally, left greater than right. Moderate stenosis distal left M1 segment. Hypoplastic left A1 segment. Both anterior cerebral arteries supplied from the right and are patent. Right middle cerebral artery is patent with mild stenosis of the parietal branch of the left middle cerebral artery. Posterior circulation: Moderate stenosis distal vertebral artery due to calcific plaque. Moderate stenosis mid basilar. AICA patent bilaterally. Superior cerebellar and posterior cerebral arteries patent bilaterally. Fetal origin left posterior cerebral artery. Hypoplastic left P1 segment. Venous sinuses: Patent Anatomic variants: Negative for aneurysm Delayed phase: Normal enhancement on delayed imaging. IMPRESSION: No acute intracranial abnormality. Separate CT head results. See separate CT perfusion results. 60% diameter stenosis proximal right internal carotid artery. 50% diameter stenosis proximal left internal carotid artery. Moderate stenosis through the carotid siphon bilaterally. Moderate stenosis distal vertebral artery bilaterally. Moderate stenosis mid basilar artery. Moderate stenosis distal left M1 segment. Mild stenosis right middle cerebral artery branch to the parietal lobe. Multiple subcutaneous cysts in the face containing gas. These may represent infected dermal cysts. Critical Value/emergent results were called by telephone at the time of interpretation on 05/08/2015 at 3:45 pm to Dr. Faythe Dingwall, who verbally acknowledged these results. Electronically  Signed   By: Franchot Gallo M.D.   On: 05/08/2015 15:57   Mr Brain Wo Contrast  05/08/2015  CLINICAL DATA:  65 year old hypertensive male with left-sided weakness and facial droop. Subsequent encounter. EXAM: MRI HEAD WITHOUT CONTRAST TECHNIQUE: Multiplanar, multiecho pulse sequences of the brain and surrounding structures were obtained without intravenous contrast. COMPARISON:  CT and CT angiogram 05/08/2015.  06/28/2008 brain MR. FINDINGS: Acute nonhemorrhagic small infarct at the junction of the right pons and medulla. Remote moderate size infarct inferior left cerebellum. Remote bilateral basal ganglia and thalamic infarcts. Remote bilateral corona radiata infarcts extend across the corpus callosum. Tiny blood breakdown products right thalamus consistent with prior hemorrhagic ischemia otherwise no intracranial hemorrhage. Moderate small vessel disease type changes. Global atrophy without hydrocephalus. No intracranial mass lesion noted on this unenhanced exam. Major intracranial vascular structures appear patent. Degree of narrowing better assessed on recent CT angiogram. Cervical spondylotic changes with spinal stenosis and mild cord flattening C3-4. Cervical medullary junction, pituitary and pineal region within normal limits. Post lens replacement with mild exophthalmos. Paranasal sinus mucosal thickening most notable involving ethmoid sinus air cells. Nonspecific 2 cm subcutaneous mass superficial to the left masseter muscle. IMPRESSION: Acute nonhemorrhagic small infarct at the junction of the right pons and medulla. Remote moderate size infarct inferior left cerebellum. Remote bilateral basal ganglia and thalamic infarcts. Remote bilateral corona radiata infarcts extend across the corpus callosum. Moderate small vessel disease type changes. Global atrophy Major intracranial vascular structures appear patent. Degree of narrowing better assessed on recent CT angiogram. Cervical spondylotic changes with  spinal stenosis and mild cord flattening C3-4. Cervical medullary junction, pituitary and pineal region within normal limits. Post lens replacement with mild exophthalmos. Paranasal sinus mucosal  thickening most notable involving ethmoid sinus air cells. Nonspecific 2 cm subcutaneous mass superficial to the left masseter muscle. Electronically Signed   By: Genia Del M.D.   On: 05/08/2015 17:29   Ct Cerebral Perfusion W/cm  05/08/2015  CLINICAL DATA:  Left-sided weakness.  Stroke. EXAM: CT CEREBRAL PERFUSION WITH CONTRAST TECHNIQUE: CT perfusion was obtained through the basal ganglia and cerebral hemispheres. Computer generator calculations of mean transit time, time to peak perfusion, and cerebral blood volume were obtained. CONTRAST:  143mL OMNIPAQUE IOHEXOL 350 MG/ML SOLN COMPARISON:  CT head today FINDINGS: No areas of abnormal perfusion identified. Normal mean transit time. Normal cerebral blood volume. No evidence of infarct or acute ischemia. IMPRESSION: Negative CT perfusion Critical Value/emergent results were called by telephone at the time of interpretation on 05/08/2015 at 3:45 Pm to Dr. Faythe Dingwall , who verbally acknowledged these results. Electronically Signed   By: Franchot Gallo M.D.   On: 05/08/2015 16:02    Microbiology: Recent Results (from the past 240 hour(s))  Culture, blood (routine x 2)     Status: None (Preliminary result)   Collection Time: 05/08/15  4:04 PM  Result Value Ref Range Status   Specimen Description BLOOD RIGHT FOREARM  Final   Special Requests BOTTLES DRAWN AEROBIC AND ANAEROBIC 10CC  Final   Culture NO GROWTH 2 DAYS  Final   Report Status PENDING  Incomplete  Culture, blood (routine x 2)     Status: None (Preliminary result)   Collection Time: 05/08/15  4:11 PM  Result Value Ref Range Status   Specimen Description BLOOD RIGHT HAND  Final   Special Requests BOTTLES DRAWN AEROBIC AND ANAEROBIC 10CC  Final   Culture NO GROWTH 2 DAYS  Final   Report Status  PENDING  Incomplete     Labs: Basic Metabolic Panel:  Recent Labs Lab 05/08/15 1435 05/08/15 1444 05/09/15 0546  NA 138 138 140  K 4.1 3.9 3.9  CL 103 101 105  CO2 24  --  25  GLUCOSE 90 85 70  BUN 11 12 10   CREATININE 1.15 1.20 1.14  CALCIUM 9.0  --  8.9   Liver Function Tests:  Recent Labs Lab 05/08/15 1435  AST 21  ALT 18  ALKPHOS 53  BILITOT 1.2  PROT 6.8  ALBUMIN 3.6   No results for input(s): LIPASE, AMYLASE in the last 168 hours.  Recent Labs Lab 05/08/15 1604  AMMONIA 37*   CBC:  Recent Labs Lab 05/08/15 1435 05/08/15 1444 05/09/15 0546  WBC 8.7  --  6.9  NEUTROABS 6.5  --   --   HGB 14.2 16.3 13.1  HCT 43.7 48.0 40.8  MCV 90.9  --  91.5  PLT 206  --  203   Cardiac Enzymes: No results for input(s): CKTOTAL, CKMB, CKMBINDEX, TROPONINI in the last 168 hours. BNP: BNP (last 3 results) No results for input(s): BNP in the last 8760 hours.  ProBNP (last 3 results) No results for input(s): PROBNP in the last 8760 hours.  CBG:  Recent Labs Lab 05/08/15 1359  GLUCAP 94       Signed:  Delfina Redwood MD  FACP  [facility] 05/11/2015, 12:46 PM

## 2015-05-11 NOTE — Progress Notes (Signed)
Speech Language Pathology Treatment: Dysphagia;Cognitive-Linquistic  Patient Details Name: Andre [name] MRN: [id number] DOB: 10/28/1950 Today's Date: 05/11/2015 Time: [id number] SLP Time Calculation (min) (ACUTE ONLY): 27 min  Assessment / Plan / Recommendation Clinical Impression  Pt with improved speech intelligibility *and movement of left arm (since initial eval 2 days prior).  He continues with productive cough but denies coughing associated with po intake or dysphagia.  SLP observed pt consuming water and sausage - slow but effective "mastication" noted and no indications of airway compromise.  Using teach back, reviewed purpose of swallow precautions and compensations.   Given lack of dentition, pt will benefit from remaining on dys3/thin diet - he reports agreement.   Pt now reports speech to be at baseline but admits to ongoing hoarseness, SLP questions how much current "cold" with coughing impacting pt vocal quality.    Treatment also focused on dysarthria,  - pt speech intelligibility was 90% at sentence level today without cues.  Cognitive linguistic abilities including attending to the right side.  Pt able to read 2 simple sentences and follow directions with large print and hand over hand cues to use finger for visual scanning right.  Pt able to demonstrate strategy usage with mod I.   Recommend follow up at next venue of care to help maximize pt's functional rehab and decrease caregiver burden.  Thanks for allowing me to help care for this pt.    HPI HPI: 65 y.o. male has a past medical history significant for stroke, hypertension, alcohol dependence and polysubstance abuse, presents to the emergency room with difficulties moving his left arm and left leg as well as weakness in his face, slurred speech and double vision.  Pt failed RN SSS and SLP eval indicated.  Pt found to have a CVA right pons and medulla junction.   Pt reports symptoms started at 2 am on Tuesday.  CXR negative.   Prior dysphagia - decreased UES opening with subsequent residuals.  Pt recommend dys3/ground meats/thin with 2-3 swallows.  Pt denies dysphagia but admits to cold symptoms x 1 week with coughing and expectoration of secretions - clear.        SLP Plan  Continue with current plan of care (for cognitive skills)     Recommendations  Diet recommendations: Dysphagia 3 (mechanical soft);Thin liquid Liquids provided via: Cup Medication Administration: Whole meds with puree Compensations: Slow rate;Small sips/bites (drink liquids t/o meal, intermittent dry swallow) Postural Changes and/or Swallow Maneuvers: Seated upright 90 degrees;Upright 30-60 min after meal      Patient may use Passy-Muir Speech Valve: Intermittently with supervision       General recommendations: Rehab consult Oral Care Recommendations: Oral care BID Follow up Recommendations: Inpatient Rehab Plan: Continue with current plan of care (for cognitive skills)   Claudie Fisherman, [place] [facility] SLP [phone]

## 2015-05-11 NOTE — H&P (View-Only) (Signed)
Physical Medicine and Rehabilitation Admission H&P    Chief Complaint  Patient presents with  . Left sided weakness, decreased sensation left side, speech difficulty and dizziness    HPI:   Andre [name] is a 65 y.o. male with history of CVA with mild LLE weakness, polysubstance abuse who was admitted on 05/08/15 with left sided weakness and left facial droop. Patient had been out drinking with friends the night before and woke up the next day with worsening of left sided weakness. UDS positive for cocaine. CT head negative and CTA head/neck showed 60% stenosis proximal R-ICA and 50% stenosis L-ICA, moderate stenosis distal B-Va and mid basilar artery. MRI brain done revealing acute non-hemorrhagic infarct in right pons and medulla. 2 D echo with EF 50-55% and no wall abnormality. EEG negative for seizure/normal study. [name] consulted and felt that stroke due to diffuse intracranial stenosis and recommended ASA/plavix for 3 months followed by Plavix alone. Patient continues to deny cocaine use and has been counseled on abstinence. Patient with resultant LLE instability,  ataxic mixed dysarthria, mild oral dysphagia, sensory deficits, visual deficits with diplopia, and dizziness affecting ability to carry out self care tasks as well as mobility. CIR recommended by MD and rehab team for follow up therapy.    Review of Systems  HENT: Negative for hearing loss.   Eyes: Positive for blurred vision.  Respiratory: Positive for sputum production. Negative for shortness of breath and wheezing.        Feels congested. Mild cough due to "cold"  Cardiovascular: Negative for chest pain and palpitations.  Gastrointestinal: Positive for constipation (hasn't had BM since admission). Negative for heartburn, nausea and vomiting.  Genitourinary: Negative for dysuria and urgency.  Musculoskeletal: Negative for myalgias, back pain and joint pain.  Skin: Negative for itching.  Neurological: Positive for  dizziness, sensory change, speech change and focal weakness. Negative for headaches.  Psychiatric/Behavioral: Negative for depression. The patient does not have insomnia.       Past Medical History  Diagnosis Date  . Hypertension   . Stroke [facility])     Past Surgical History  Procedure Laterality Date  . Abdominal surgery      Family History  Problem Relation Age of Onset  . Hypertension Mother   . Hypertension Father     Social History:  Widowed-lives with girlfriend who has health issues but can provide some supervision after discharge. Disabled since [place] 2012. Independent with cane PTA. He reports that he has been smoking--about 5 cigarettes daily. He does not have any smokeless tobacco history on file. He reports that he drinks beer daily. Had four 40 ounces cans over New Years day. He denies illicit drug use.    Allergies: No Known Allergies    Medications Prior to Admission  Medication Sig Dispense Refill  . lisinopril (PRINIVIL,ZESTRIL) 10 MG tablet Take 10 mg by mouth daily.  0  . Multiple Vitamins-Minerals (MULTIVITAMINS THER. W/MINERALS) TABS Take 1 tablet by mouth daily. 30 each 0  . simvastatin (ZOCOR) 20 MG tablet Take 20 mg by mouth daily.  0  . amLODipine (NORVASC) 10 MG tablet Take 1 tablet (10 mg total) by mouth daily. 30 tablet 0  . guaiFENesin-codeine (ROBITUSSIN AC) 100-10 MG/5ML syrup Take 5 mLs by mouth 3 (three) times daily as needed for cough. (Patient not taking: Reported on 05/08/2015) 120 mL 0  . metoprolol (LOPRESSOR) 100 MG tablet Take 100 mg by mouth 2 (two) times daily.  Home: Home Living Family/patient expects to be discharged to:: Private residence Living Arrangements: Spouse/significant other Type of Home: Apartment Home Access: [place]: One level Bathroom Shower/Tub: Tub/shower unit Home Equipment: Radio producer - single point  Lives With: Significant other, Other (Comment) (20 years per significant other's report with pt  present)   Functional History: Prior Function Level of Independence: Independent with assistive device(s) Comments: cane for ambulation, no assist ADLs  Functional Status:  Mobility: Bed Mobility Overal bed mobility: Needs Assistance Bed Mobility: Supine to Sit Supine to sit: Min guard General bed mobility comments: Pt was able to transition to EOB without assistance this session. Increased cues required for pt to initiate scooting fully to EOB.  Transfers Overall transfer level: Needs assistance Equipment used: Rolling walker (2 wheeled) Transfers: Sit to/from Stand Sit to Stand: Min assist General transfer comment: Steadying assist to power-up to full standing position. VC's for hand placement on seated surface for safety. Pt able to place LUE on walker without assistance this session.  Ambulation/Gait Ambulation/Gait assistance: Min assist Ambulation Distance (Feet): 200 Feet (100' x2) Assistive device: Rolling walker (2 wheeled) Gait Pattern/deviations: Step-through pattern, Decreased stride length, Trunk flexed General Gait Details: Noted significant L knee hyperextension during gait training. Occasional min assist required for balance and walker management. Pt reports dizziness with standing activity.  Gait velocity: Decreased Gait velocity interpretation: Below normal speed for age/gender    ADL: ADL Overall ADL's : Needs assistance/impaired Eating/Feeding: Set up, Sitting Grooming: Moderate assistance, Sitting Upper Body Bathing: Moderate assistance, Sitting Lower Body Bathing: Maximal assistance, Sit to/from stand Upper Body Dressing : Moderate assistance, Sitting Lower Body Dressing: Maximal assistance, Sit to/from stand Toilet Transfer: Maximal assistance, Stand-pivot, BSC Toileting- Clothing Manipulation and Hygiene: Maximal assistance, Sit to/from stand Tub/ Shower Transfer: Maximal assistance, Stand-pivot, 3 in 1 General ADL Comments: Pt completed sit<>stand  from recliner with mod-max A. Pt stood for about 1 minute. Dizziness reported as pt leaned forward in preparation to stand and lasted until pt sat back in recliner again.   Cognition: Cognition Overall Cognitive Status: Within Functional Limits for tasks assessed Arousal/Alertness: Awake/alert Orientation Level: Oriented X4 Attention: Sustained, Selective Sustained Attention: Appears intact Selective Attention: Appears intact Memory: Impaired Memory Impairment: Retrieval deficit (pt recalled 3/5 words independently within 5 minutes, 2 with cues ) Awareness: Appears intact (aware to need to call for assist to prevent falls due to left sided weakness) Safety/Judgment: Appears intact Comments: abstract thinking deficits noted with pt able to identify basic similarities between items eg: food vs fruit, noise maker vs musical instruments Cognition Arousal/Alertness: Awake/alert Behavior During Therapy: WFL for tasks assessed/performed Overall Cognitive Status: Within Functional Limits for tasks assessed   Physical Exam: Blood pressure 142/89, pulse 77, temperature 98.4 F (36.9 C), temperature source Oral, resp. rate 18, height 5\' 6"  (1.676 m), weight 77.474 kg (170 lb 12.8 oz), SpO2 96 %. Physical Exam  Nursing note and vitals reviewed. Constitutional: He is oriented to person, place, and time. He appears well-developed and well-nourished.  HENT:  Head: Normocephalic and atraumatic.  Eyes: Right conjunctiva is not injected. Left conjunctiva is not injected. Right eye exhibits normal extraocular motion. Left eye exhibits normal extraocular motion.  Neck: Normal range of motion. Neck supple. No tracheal deviation present. No thyromegaly present.  Cardiovascular: Normal rate and regular rhythm.  Exam reveals no friction rub.   No murmur heard. Respiratory: Effort normal. No respiratory distress. He has wheezes (LLL). He has no rales.  GI: Soft. Bowel sounds  are normal. He exhibits no  distension. There is no tenderness.  Musculoskeletal: He exhibits no edema or tenderness.  Neurological: He is alert and oriented to person, place, and time.  Up in bed with tendency to slump to the right. Has right inattention-- Unable to turn right eye to right field and left eye fixed to lateral left field.  Sensory deficits left side with question apraxia. Voice soft with mild dysarthria.   Able to follow basic one and two step motor commands. Motor RUE/RLE 4+-5/5.  LUE: 3- delt, 3- bicep, wrist, tricep, 2+HI, with apraxia LLE: 2+/5 hip flexion, 3- KE, 3/5 ankle dorsi/plantar flexion Sensation diminished to light touch LUE/LLE 1+/2 3+ DTR LUE  Skin: Skin is warm and dry.  Psychiatric: He is withdrawn. Cognition and memory are normal.    No results found for this or any previous visit (from the past 48 hour(s)). No results found.     Medical Problem List and Plan: 1.  Left hemiparesis and cognitive/visual deficits secondary to right ponto-medullary infarct 2.  DVT Prophylaxis/Anticoagulation: Pharmaceutical: Lovenox 3. Pain Management: N/A 4. Mood: LCSW to follow for evaluatin and support.  5. Neuropsych: This patient is capable of making decisions on his own behalf. 6. Skin/Wound Care: routine pressure relief measures. Patient able to reposition himself in bed without difficulty.  7. Fluids/Electrolytes/Nutrition: Monitor I/O. Check lytes in am.  8. HTN: Monitor BP tid. Allow for higher blood pressure to allow adequate perfusion. Lisinopril and amlodipine on hold at at this time.  9. Dyslipidemia: continue Zocor.  10. Polysubstance abuse: On CIWA protocol. No signs of withdrawal reported/noted.  11. URI: Will add nasal spray to help with congestion and schedule cough medication. Nebs prn.  12. Constipation: Will order Miralax today and schedule Senna S daily at bedtime.   Post Admission Physician Evaluation: 1. Functional deficits secondary  to right ponto-medullary  infarct. 2. Patient is admitted to receive collaborative, interdisciplinary care between the physiatrist, rehab nursing staff, and therapy team. 3. Patient's level of medical complexity and substantial therapy needs in context of that medical necessity cannot be provided at a lesser intensity of care such as a SNF. 4. Patient has experienced substantial functional loss from his/her baseline which was documented above under the "Functional History" and "Functional Status" headings.  Judging by the patient's diagnosis, physical exam, and functional history, the patient has potential for functional progress which will result in measurable gains while on inpatient rehab.  These gains will be of substantial and practical use upon discharge  in facilitating mobility and self-care at the household level. 5. Physiatrist will provide 24 hour management of medical needs as well as oversight of the therapy plan/treatment and provide guidance as appropriate regarding the interaction of the two. 6. 24 hour rehab nursing will assist with bladder management, bowel management, safety, skin/wound care, disease management, medication administration, pain management and patient education  and help integrate therapy concepts, techniques,education, etc. 7. PT will assess and treat for/with: Lower extremity strength, range of motion, stamina, balance, functional mobility, safety, adaptive techniques and equipment, NMR, visual-spatial rx, stroke education, caregiver education.   Goals are: mod I. 8. OT will assess and treat for/with: ADL's, functional mobility, safety, upper extremity strength, adaptive techniques and equipment, NMR, visual-spatial rx, community reintegration, caregiver ed.   Goals are: supervision to mod I. Therapy may proceed with showering this patient. 9. SLP will assess and treat for/with: cognition, communication, education.  Goals are: supervision to mod I. 10. Case Management and  Social Worker will assess  and treat for psychological issues and discharge planning. 11. Team conference will be held weekly to assess progress toward goals and to determine barriers to discharge. 12. Patient will receive at least 3 hours of therapy per day at least 5 days per week. 13. ELOS: 8-12 days       14. Prognosis:  excellent     Meredith Staggers, MD, [facility] 05/11/2015   05/11/2015

## 2015-05-11 NOTE — Care Management Note (Signed)
Case Management Note  Patient Details  Name: Andre [name] MRN: [id number] Date of Birth: 1950/06/18  Subjective/Objective:                    Action/Plan: Patient discharging to CIR today. No further needs per CM.   Expected Discharge Date:                  Expected Discharge Plan:  [place]  In-House Referral:     Discharge planning Services     Post Acute Care Choice:    Choice offered to:     DME Arranged:    DME Agency:     HH Arranged:    [place] Agency:     Status of Service:  Completed, signed off  Medicare Important Message Given:    Date Medicare IM Given:    Medicare IM give by:    Date Additional Medicare IM Given:    Additional Medicare Important Message give by:     If discussed at [place] of Stay Meetings, dates discussed:    Additional Comments:  Pollie Friar, RN 05/11/2015, 3:45 PM

## 2015-05-11 NOTE — H&P (Signed)
Physical Medicine and Rehabilitation Admission H&P    Chief Complaint  Patient presents with  . Left sided weakness, decreased sensation left side, speech difficulty and dizziness    HPI:   Andre [name] is a 64 y.o. male with history of CVA with mild LLE weakness, polysubstance abuse who was admitted on 05/08/15 with left sided weakness and left facial droop. Patient had been out drinking with friends the night before and woke up the next day with worsening of left sided weakness. UDS positive for cocaine. CT head negative and CTA head/neck showed 60% stenosis proximal R-ICA and 50% stenosis L-ICA, moderate stenosis distal B-Va and mid basilar artery. MRI brain done revealing acute non-hemorrhagic infarct in right pons and medulla. 2 D echo with EF 50-55% and no wall abnormality. EEG negative for seizure/normal study. [name] consulted and felt that stroke due to diffuse intracranial stenosis and recommended ASA/plavix for 3 months followed by Plavix alone. Patient continues to deny cocaine use and has been counseled on abstinence. Patient with resultant LLE instability,  ataxic mixed dysarthria, mild oral dysphagia, sensory deficits, visual deficits with diplopia, and dizziness affecting ability to carry out self care tasks as well as mobility. CIR recommended by MD and rehab team for follow up therapy.    Review of Systems  HENT: Negative for hearing loss.   Eyes: Positive for blurred vision.  Respiratory: Positive for sputum production. Negative for shortness of breath and wheezing.        Feels congested. Mild cough due to "cold"  Cardiovascular: Negative for chest pain and palpitations.  Gastrointestinal: Positive for constipation (hasn't had BM since admission). Negative for heartburn, nausea and vomiting.  Genitourinary: Negative for dysuria and urgency.  Musculoskeletal: Negative for myalgias, back pain and joint pain.  Skin: Negative for itching.  Neurological: Positive for  dizziness, sensory change, speech change and focal weakness. Negative for headaches.  Psychiatric/Behavioral: Negative for depression. The patient does not have insomnia.       Past Medical History  Diagnosis Date  . Hypertension   . Stroke [facility])     Past Surgical History  Procedure Laterality Date  . Abdominal surgery      Family History  Problem Relation Age of Onset  . Hypertension Mother   . Hypertension Father     Social History:  Widowed-lives with girlfriend who has health issues but can provide some supervision after discharge. Disabled since [place] 2012. Independent with cane PTA. He reports that he has been smoking--about 5 cigarettes daily. He does not have any smokeless tobacco history on file. He reports that he drinks beer daily. Had four 40 ounces cans over New Years day. He denies illicit drug use.    Allergies: No Known Allergies    Medications Prior to Admission  Medication Sig Dispense Refill  . lisinopril (PRINIVIL,ZESTRIL) 10 MG tablet Take 10 mg by mouth daily.  0  . Multiple Vitamins-Minerals (MULTIVITAMINS THER. W/MINERALS) TABS Take 1 tablet by mouth daily. 30 each 0  . simvastatin (ZOCOR) 20 MG tablet Take 20 mg by mouth daily.  0  . amLODipine (NORVASC) 10 MG tablet Take 1 tablet (10 mg total) by mouth daily. 30 tablet 0  . guaiFENesin-codeine (ROBITUSSIN AC) 100-10 MG/5ML syrup Take 5 mLs by mouth 3 (three) times daily as needed for cough. (Patient not taking: Reported on 05/08/2015) 120 mL 0  . metoprolol (LOPRESSOR) 100 MG tablet Take 100 mg by mouth 2 (two) times daily.  Home: Home Living Family/patient expects to be discharged to:: Private residence Living Arrangements: Spouse/significant other Type of Home: Apartment Home Access: [place]: One level Bathroom Shower/Tub: Tub/shower unit Home Equipment: Radio producer - single point  Lives With: Significant other, Other (Comment) (20 years per significant other's report with pt  present)   Functional History: Prior Function Level of Independence: Independent with assistive device(s) Comments: cane for ambulation, no assist ADLs  Functional Status:  Mobility: Bed Mobility Overal bed mobility: Needs Assistance Bed Mobility: Supine to Sit Supine to sit: Min guard General bed mobility comments: Pt was able to transition to EOB without assistance this session. Increased cues required for pt to initiate scooting fully to EOB.  Transfers Overall transfer level: Needs assistance Equipment used: Rolling walker (2 wheeled) Transfers: Sit to/from Stand Sit to Stand: Min assist General transfer comment: Steadying assist to power-up to full standing position. VC's for hand placement on seated surface for safety. Pt able to place LUE on walker without assistance this session.  Ambulation/Gait Ambulation/Gait assistance: Min assist Ambulation Distance (Feet): 200 Feet (100' x2) Assistive device: Rolling walker (2 wheeled) Gait Pattern/deviations: Step-through pattern, Decreased stride length, Trunk flexed General Gait Details: Noted significant L knee hyperextension during gait training. Occasional min assist required for balance and walker management. Pt reports dizziness with standing activity.  Gait velocity: Decreased Gait velocity interpretation: Below normal speed for age/gender    ADL: ADL Overall ADL's : Needs assistance/impaired Eating/Feeding: Set up, Sitting Grooming: Moderate assistance, Sitting Upper Body Bathing: Moderate assistance, Sitting Lower Body Bathing: Maximal assistance, Sit to/from stand Upper Body Dressing : Moderate assistance, Sitting Lower Body Dressing: Maximal assistance, Sit to/from stand Toilet Transfer: Maximal assistance, Stand-pivot, BSC Toileting- Clothing Manipulation and Hygiene: Maximal assistance, Sit to/from stand Tub/ Shower Transfer: Maximal assistance, Stand-pivot, 3 in 1 General ADL Comments: Pt completed sit<>stand  from recliner with mod-max A. Pt stood for about 1 minute. Dizziness reported as pt leaned forward in preparation to stand and lasted until pt sat back in recliner again.   Cognition: Cognition Overall Cognitive Status: Within Functional Limits for tasks assessed Arousal/Alertness: Awake/alert Orientation Level: Oriented X4 Attention: Sustained, Selective Sustained Attention: Appears intact Selective Attention: Appears intact Memory: Impaired Memory Impairment: Retrieval deficit (pt recalled 3/5 words independently within 5 minutes, 2 with cues ) Awareness: Appears intact (aware to need to call for assist to prevent falls due to left sided weakness) Safety/Judgment: Appears intact Comments: abstract thinking deficits noted with pt able to identify basic similarities between items eg: food vs fruit, noise maker vs musical instruments Cognition Arousal/Alertness: Awake/alert Behavior During Therapy: WFL for tasks assessed/performed Overall Cognitive Status: Within Functional Limits for tasks assessed   Physical Exam: Blood pressure 142/89, pulse 77, temperature 98.4 F (36.9 C), temperature source Oral, resp. rate 18, height 5\' 6"  (1.676 m), weight 77.474 kg (170 lb 12.8 oz), SpO2 96 %. Physical Exam  Nursing note and vitals reviewed. Constitutional: He is oriented to person, place, and time. He appears well-developed and well-nourished.  HENT:  Head: Normocephalic and atraumatic.  Eyes: Right conjunctiva is not injected. Left conjunctiva is not injected. Right eye exhibits normal extraocular motion. Left eye exhibits normal extraocular motion.  Neck: Normal range of motion. Neck supple. No tracheal deviation present. No thyromegaly present.  Cardiovascular: Normal rate and regular rhythm.  Exam reveals no friction rub.   No murmur heard. Respiratory: Effort normal. No respiratory distress. He has wheezes (LLL). He has no rales.  GI: Soft. Bowel sounds  are normal. He exhibits no  distension. There is no tenderness.  Musculoskeletal: He exhibits no edema or tenderness.  Neurological: He is alert and oriented to person, place, and time.  Up in bed with tendency to slump to the right. Has right inattention-- Unable to turn right eye to right field and left eye fixed to lateral left field.  Sensory deficits left side with question apraxia. Voice soft with mild dysarthria.   Able to follow basic one and two step motor commands. Motor RUE/RLE 4+-5/5.  LUE: 3- delt, 3- bicep, wrist, tricep, 2+HI, with apraxia LLE: 2+/5 hip flexion, 3- KE, 3/5 ankle dorsi/plantar flexion Sensation diminished to light touch LUE/LLE 1+/2 3+ DTR LUE  Skin: Skin is warm and dry.  Psychiatric: He is withdrawn. Cognition and memory are normal.    No results found for this or any previous visit (from the past 48 hour(s)). No results found.     Medical Problem List and Plan: 1.  Left hemiparesis and cognitive/visual deficits secondary to right ponto-medullary infarct 2.  DVT Prophylaxis/Anticoagulation: Pharmaceutical: Lovenox 3. Pain Management: N/A 4. Mood: LCSW to follow for evaluatin and support.  5. Neuropsych: This patient is capable of making decisions on his own behalf. 6. Skin/Wound Care: routine pressure relief measures. Patient able to reposition himself in bed without difficulty.  7. Fluids/Electrolytes/Nutrition: Monitor I/O. Check lytes in am.  8. HTN: Monitor BP tid. Allow for higher blood pressure to allow adequate perfusion. Lisinopril and amlodipine on hold at at this time.  9. Dyslipidemia: continue Zocor.  10. Polysubstance abuse: On CIWA protocol. No signs of withdrawal reported/noted.  11. URI: Will add nasal spray to help with congestion and schedule cough medication. Nebs prn.  12. Constipation: Will order Miralax today and schedule Senna S daily at bedtime.   Post Admission Physician Evaluation: 1. Functional deficits secondary  to right ponto-medullary  infarct. 2. Patient is admitted to receive collaborative, interdisciplinary care between the physiatrist, rehab nursing staff, and therapy team. 3. Patient's level of medical complexity and substantial therapy needs in context of that medical necessity cannot be provided at a lesser intensity of care such as a SNF. 4. Patient has experienced substantial functional loss from his/her baseline which was documented above under the "Functional History" and "Functional Status" headings.  Judging by the patient's diagnosis, physical exam, and functional history, the patient has potential for functional progress which will result in measurable gains while on inpatient rehab.  These gains will be of substantial and practical use upon discharge  in facilitating mobility and self-care at the household level. 5. Physiatrist will provide 24 hour management of medical needs as well as oversight of the therapy plan/treatment and provide guidance as appropriate regarding the interaction of the two. 6. 24 hour rehab nursing will assist with bladder management, bowel management, safety, skin/wound care, disease management, medication administration, pain management and patient education  and help integrate therapy concepts, techniques,education, etc. 7. PT will assess and treat for/with: Lower extremity strength, range of motion, stamina, balance, functional mobility, safety, adaptive techniques and equipment, NMR, visual-spatial rx, stroke education, caregiver education.   Goals are: mod I. 8. OT will assess and treat for/with: ADL's, functional mobility, safety, upper extremity strength, adaptive techniques and equipment, NMR, visual-spatial rx, community reintegration, caregiver ed.   Goals are: supervision to mod I. Therapy may proceed with showering this patient. 9. SLP will assess and treat for/with: cognition, communication, education.  Goals are: supervision to mod I. 10. Case Management and  Social Worker will assess  and treat for psychological issues and discharge planning. 11. Team conference will be held weekly to assess progress toward goals and to determine barriers to discharge. 12. Patient will receive at least 3 hours of therapy per day at least 5 days per week. 13. ELOS: 8-12 days       14. Prognosis:  excellent     Meredith Staggers, MD, [facility] 05/11/2015   05/11/2015

## 2015-05-11 NOTE — PMR Pre-admission (Signed)
PMR Admission Coordinator Pre-Admission Assessment  Patient: Andre [name] is an 65 y.o., male MRN: [id number] DOB: [birth year] Height: 5\' 6"  (167.6 cm) Weight: 77.474 kg (170 lb 12.8 oz)              Insurance Information HMO:     PPO:      PCP:      IPA:      80/20:      OTHER:  PRIMARY: Medicaid [place] [facility]       Policy#: [id number]      Subscriber: Self CM Name:       Phone#:      Fax#:  Pre-Cert#:       Employer: disabled not employed  Benefits:  Phone #:    Name:  Eff. Date: eligible 05/11/15 with coverage code [id number]    Deduct:      Out of Pocket Max:       Life Max:  CIR:        SNF:  Outpatient:      Co-Pay:  Home Health:       Co-Pay:  DME:      Co-Pay:  Providers:   Medicaid Application Date:       Case Manager:  Disability Application Date:       Case Worker:   Emergency Contact Information Contact Information    Name Relation Home Work Mobile   Briggs,Cleve Significant other [phone]     No name specified         Current Medical History  Patient Admitting Diagnosis: Non-hemorrhagic infarct in right pons and medulla  History of Present Illness: Andre [name] is a 65 y.o. male with history of CVA with mild LLE weakness, polysubstance abuse who was admitted on 05/08/15 with left sided weakness and left facial droop. Patient had been out drinking with friends the night before and woke up the next day with worsening of left sided weakness. UDS positive for cocaine. CT head negative and CTA head/neck showed 60% stenosis proximal R-ICA and 50% stenosis L-ICA, moderate stenosis distal B-Va and mid basilar artery. MRI brain done revealing acute non-hemorrhagic infarct in right pons and medulla. 2 D echo with EF 50-55% and no wall abnormality. EEG negative for seizure/normal study. [name] consulted and felt that stroke due to diffuse intracranial stenosis and recommended ASA/plavix for 3 months followed by Plavix alone. Patient continues to deny cocaine use and has  been counseled on abstinence. Patient with resultant LLE instability, ataxic mixed dysarthria, mild oral dysphagia, sensory deficits, visual deficits with diplopia, and dizziness affecting ability to carry out self care tasks as well as mobility. CIR recommended by MD and rehab team for follow up therapy.    NIH Total: 11    Past Medical History  Past Medical History  Diagnosis Date  . Hypertension   . Stroke [facility])     Family History  family history includes Hypertension in his father and mother.  Prior Rehab/Hospitalizations:  Has the patient had major surgery during 100 days prior to admission? No  Current Medications   Current facility-administered medications:  .  aspirin tablet 325 mg, 325 mg, Oral, Daily, Rosalin Hawking, MD, 325 mg at 05/11/15 1019 .  clopidogrel (PLAVIX) tablet 75 mg, 75 mg, Oral, Daily, Ram Fuller Mandril, MD, 75 mg at 05/11/15 1019 .  enoxaparin (LOVENOX) injection 40 mg, 40 mg, Subcutaneous, Q24H, Costin Karlyne Greenspan, MD, 40 mg at 05/10/15 1610 .  folic acid (FOLVITE) tablet 1 mg,  1 mg, Oral, Daily, Caren Griffins, MD, 1 mg at 05/11/15 1019 .  guaiFENesin-codeine 100-10 MG/5ML solution 5 mL, 5 mL, Oral, TID PRN, Delfina Redwood, MD .  [EXPIRED] LORazepam (ATIVAN) injection 0-4 mg, 0-4 mg, Intravenous, Q6H, Stopped at 05/10/15 1103 **FOLLOWED BY** LORazepam (ATIVAN) injection 0-4 mg, 0-4 mg, Intravenous, Q12H, Caren Griffins, MD, 0 mg at 05/10/15 1756 .  LORazepam (ATIVAN) tablet 1 mg, 1 mg, Oral, Q6H PRN **OR** LORazepam (ATIVAN) injection 1 mg, 1 mg, Intravenous, Q6H PRN, Caren Griffins, MD .  multivitamin with minerals tablet 1 tablet, 1 tablet, Oral, Daily, Costin Karlyne Greenspan, MD, 1 tablet at 05/11/15 1019 .  senna-docusate (Senokot-S) tablet 1 tablet, 1 tablet, Oral, QHS PRN, Caren Griffins, MD .  simvastatin (ZOCOR) tablet 20 mg, 20 mg, Oral, Daily, Costin Karlyne Greenspan, MD, 20 mg at 05/11/15 1019 .  thiamine (VITAMIN B-1) tablet 100 mg, 100 mg,  Oral, Daily, 100 mg at 05/11/15 1019 **OR** [DISCONTINUED] thiamine (B-1) injection 100 mg, 100 mg, Intravenous, Daily, Caren Griffins, MD, 100 mg at 05/08/15 2104  Patients Current Diet: DIET DYS 3 Room service appropriate?: Yes with Assist; Fluid consistency:: Thin Diet - low sodium heart healthy  Precautions / Restrictions Precautions Precautions: Fall Restrictions Weight Bearing Restrictions: No   Has the patient had 2 or more falls or a fall with injury in the past year?No until the fall that was associated with this CVA and admission   Prior Activity Level Community (5-7x/wk): Patient goes out into the community daily to go visit friends and gets there via bus.   Home Assistive Devices / Equipment Home Assistive Devices/Equipment: Cane (specify quad or straight) Home Equipment: Cane - single point  Prior Device Use: Indicate devices/aids used by the patient prior to current illness, exacerbation or injury? Single point cane when out in the community   Prior Functional Level Prior Function Level of Independence: Independent (reports using a single point cane when out of the house PTA) Comments: cane for ambulation, no assist ADLs  Self Care: Did the patient need help bathing, dressing, using the toilet or eating?  Independent  Indoor Mobility: Did the patient need assistance with walking from room to room (with or without device)? Independent  Stairs: Did the patient need assistance with internal or external stairs (with or without device)? Independent  Functional Cognition: Did the patient need help planning regular tasks such as shopping or remembering to take medications? Independent  Current Functional Level Cognition  Arousal/Alertness: Awake/alert Overall Cognitive Status: Within Functional Limits for tasks assessed Orientation Level: Oriented X4 Attention: Sustained, Selective Sustained Attention: Appears intact Selective Attention: Appears intact Memory:  Impaired Memory Impairment: Retrieval deficit (pt recalled 3/5 words independently within 5 minutes, 2 with cues ) Awareness: Appears intact (aware to need to call for assist to prevent falls due to left sided weakness) Safety/Judgment: Appears intact Comments: abstract thinking deficits noted with pt able to identify basic similarities between items eg: food vs fruit, noise maker vs musical instruments    Extremity Assessment (includes Sensation/Coordination)  Upper Extremity Assessment: Defer to OT evaluation LUE Deficits / Details: Grossly 2-/5 although stronger distal than proximal. 3/5 distal with weak grasp noted.  LUE Sensation: decreased light touch LUE Coordination: decreased fine motor, decreased gross motor  Lower Extremity Assessment: LLE deficits/detail LLE Deficits / Details: Grossly <3/5 strength in LLE. Pt mostly using RLE tucked underneath L ankle to move the LLE. LLE Sensation: decreased light touch LLE Coordination: decreased  fine motor, decreased gross motor    ADLs  Overall ADL's : Needs assistance/impaired Eating/Feeding: Set up, Sitting Grooming: Moderate assistance, Sitting Upper Body Bathing: Moderate assistance, Sitting Lower Body Bathing: Maximal assistance, Sit to/from stand Upper Body Dressing : Moderate assistance, Sitting Lower Body Dressing: Maximal assistance, Sit to/from stand Toilet Transfer: Maximal assistance, Stand-pivot, BSC Toileting- Clothing Manipulation and Hygiene: Maximal assistance, Sit to/from stand Tub/ Shower Transfer: Maximal assistance, Stand-pivot, 3 in 1 General ADL Comments: Pt completed sit<>stand from recliner with mod-max A. Pt stood for about 1 minute. Dizziness reported as pt leaned forward in preparation to stand and lasted until pt sat back in recliner again.     Mobility  Overal bed mobility: Needs Assistance Bed Mobility: Supine to Sit Supine to sit: Min guard General bed mobility comments: Pt was able to transition to  EOB without assistance this session. Increased cues required for pt to initiate scooting fully to EOB.     Transfers  Overall transfer level: Needs assistance Equipment used: Rolling walker (2 wheeled) Transfers: Sit to/from Stand Sit to Stand: Min assist General transfer comment: Steadying assist to power-up to full standing position. VC's for hand placement on seated surface for safety. Pt able to place LUE on walker without assistance this session.     Ambulation / Gait / Stairs / Wheelchair Mobility  Ambulation/Gait Ambulation/Gait assistance: Museum/gallery curator (Feet): 200 Feet (100' x2) Assistive device: Rolling walker (2 wheeled) Gait Pattern/deviations: Step-through pattern, Decreased stride length, Trunk flexed General Gait Details: Noted significant L knee hyperextension during gait training. Occasional min assist required for balance and walker management. Pt reports dizziness with standing activity.  Gait velocity: Decreased Gait velocity interpretation: Below normal speed for age/gender    Posture / Balance Dynamic Sitting Balance Sitting balance - Comments: in unsupported seating briefly in preparation to stand, c/o dizziness Balance Overall balance assessment: Needs assistance Sitting-balance support: Feet supported, No upper extremity supported Sitting balance-[name]: Fair Sitting balance - Comments: in unsupported seating briefly in preparation to stand, c/o dizziness Standing balance support: Bilateral upper extremity supported, During functional activity Standing balance-[name]: Poor Standing balance comment: Requires UE support to maintain standing balance.     Special needs/care consideration BiPAP/CPAP No CPM No  Continuous Drip IV No Dialysis No       Days N/A Life Vest No Oxygen No Special Bed No  Trach Size N/A Wound Vac (area) N/A       Skin WDL                              Location N/A Bowel mgmt: reports being continent, last  BM 05/07/15 Bladder mgmt:continent with use of urinal  Diabetic mgmt N/A     Previous Home Environment Living Arrangements: Spouse/significant other  Lives With: Significant other, Other (Comment) (20 years per significant other's report with pt present) Type of Home: Apartment Home Layout: One level Home Access: Elevator Bathroom Shower/Tub: Tub/shower unit Home Care Services: No  Discharge Living Setting Plans for Discharge Living Setting: Patient's home, Other (Comment) (Significant others apartment ) Type of Home at Discharge: Apartment Discharge Home Layout: One level Discharge Home Access: Elevator Discharge Bathroom Shower/Tub: Tub/shower unit, Curtain Discharge Bathroom Toilet: Handicapped height (per patient report there is a device to elevate the toilet) Discharge Bathroom Accessibility: Yes How Accessible: Accessible via walker Does the patient have any problems obtaining your medications?: Yes (Describe) (transportation concerns now that he  cannot take the bus)  Social/Family/Support Systems Patient Roles: Partner Contact Information: S/O: Violeta Gelinas [phone] Anticipated Caregiver: Significant Other Cleve Ability/Limitations of Caregiver: none  Caregiver Availability: 24/7 Is Caregiver In Agreement with Plan?: Yes Does Caregiver/Family have Issues with Lodging/Transportation while Pt is in Rehab?: No   Goals/Additional Needs Patient/Family Goal for Rehab: Supervision-Min assist PT/OT; Supervision/Mod I SLP Expected length of stay: 16-19 days Cultural Considerations: None Dietary Needs: None reported PTA, currently on Dys.3 textures and thin liquids  Equipment Needs: TBD Special Service Needs: None Additional Information: None Pt/Family Agrees to Admission and willing to participate: Yes Program Orientation Provided & Reviewed with Pt/Caregiver Including Roles  & Responsibilities: Yes Additional Information Needs: Patient concerned about transportation  upon discharge such he will likely be unable to ride the bus. Information Needs to be Provided By: Social Work   Decrease burden of Care through IP rehab admission: Not anticipated   Possible need for SNF placement upon discharge: Not anticipated   Patient Condition: This patient's condition remains as documented in the consult dated  05/09/15 @ 1915, in which the Rehabilitation Physician determined and documented that the patient's condition is appropriate for intensive rehabilitative care in an inpatient rehabilitation facility. Will admit to inpatient rehab today.  Preadmission Screen Completed By:  Gunnar Fusi, 05/11/2015 1:38 PM ______________________________________________________________________   Discussed status with Dr. Naaman Plummer on 05/11/15 at 1340 and received telephone approval for admission today.  Admission Coordinator:  Gunnar Fusi, time 1340/Date 05/11/15

## 2015-05-11 NOTE — Progress Notes (Signed)
Pt admitted to unit at 1620 via wheelchair from 36M. Pt educated on rehab process and safety plan with verbal understanding. Pt denies pain at this time. Call bell with in reach and bed alarm inplace.

## 2015-05-12 ENCOUNTER — Inpatient Hospital Stay (HOSPITAL_COMMUNITY): Payer: Medicare Other | Admitting: Occupational Therapy

## 2015-05-12 ENCOUNTER — Inpatient Hospital Stay (HOSPITAL_COMMUNITY): Payer: Medicare Other | Admitting: Speech Pathology

## 2015-05-12 ENCOUNTER — Inpatient Hospital Stay (HOSPITAL_COMMUNITY): Payer: Medicare Other | Admitting: Physical Therapy

## 2015-05-12 LAB — CBC WITH DIFFERENTIAL/PLATELET
BASOS ABS: 0 10*3/uL (ref 0.0–0.1)
BASOS PCT: 0 %
EOS PCT: 2 %
Eosinophils Absolute: 0.1 10*3/uL (ref 0.0–0.7)
HCT: 43.4 % (ref 39.0–52.0)
Hemoglobin: 13.9 g/dL (ref 13.0–17.0)
Lymphocytes Relative: 32 %
Lymphs Abs: 2.1 10*3/uL (ref 0.7–4.0)
MCH: 29.4 pg (ref 26.0–34.0)
MCHC: 32 g/dL (ref 30.0–36.0)
MCV: 91.8 fL (ref 78.0–100.0)
MONO ABS: 0.7 10*3/uL (ref 0.1–1.0)
MONOS PCT: 11 %
Neutro Abs: 3.6 10*3/uL (ref 1.7–7.7)
Neutrophils Relative %: 55 %
PLATELETS: 227 10*3/uL (ref 150–400)
RBC: 4.73 MIL/uL (ref 4.22–5.81)
RDW: 12.2 % (ref 11.5–15.5)
WBC: 6.6 10*3/uL (ref 4.0–10.5)

## 2015-05-12 LAB — COMPREHENSIVE METABOLIC PANEL
ALBUMIN: 3.4 g/dL — AB (ref 3.5–5.0)
ALT: 14 U/L — ABNORMAL LOW (ref 17–63)
ANION GAP: 10 (ref 5–15)
AST: 17 U/L (ref 15–41)
Alkaline Phosphatase: 49 U/L (ref 38–126)
BILIRUBIN TOTAL: 0.9 mg/dL (ref 0.3–1.2)
BUN: 12 mg/dL (ref 6–20)
CHLORIDE: 105 mmol/L (ref 101–111)
CO2: 25 mmol/L (ref 22–32)
Calcium: 9.3 mg/dL (ref 8.9–10.3)
Creatinine, Ser: 1.36 mg/dL — ABNORMAL HIGH (ref 0.61–1.24)
GFR calc Af Amer: >60 mL/min (ref >60)
GFR, EST NON AFRICAN AMERICAN: 54 mL/min — AB (ref >60)
Glucose, Bld: 80 mg/dL (ref 65–99)
POTASSIUM: 3.7 mmol/L (ref 3.5–5.1)
Sodium: 140 mmol/L (ref 135–145)
TOTAL PROTEIN: 6.7 g/dL (ref 6.5–8.1)

## 2015-05-12 NOTE — Evaluation (Signed)
Physical Therapy Assessment and Plan  Patient Details  Name: Andre [name] MRN: [id number] Date of Birth: 1951/02/07  PT Diagnosis: Ataxia, Impaired cognition, Impaired sensation and Muscle weakness Rehab Potential: Fair ELOS: 12 to 14 days   Today's Date: 05/12/2015 PT Individual Time: 1330-1430 PT Individual Time Calculation (min): 60 min    Problem List:  Patient Active Problem List   Diagnosis Date Noted  . Cerebral infarction due to thrombosis of right posterior cerebral artery ([place]) 05/11/2015  . Left hemiparesis ([place]) 05/11/2015  . Essential hypertension 05/09/2015  . Stroke ([place]) 05/09/2015  . Tobacco use disorder   . Cocaine abuse   . HLD (hyperlipidemia)   . ETOH abuse   . History of CVA with residual deficit   . Dysphagia as late effect of cerebrovascular disease   . Acute ischemic stroke ([place]) 05/08/2015  . Left-sided weakness   . Alcohol abuse   . Stroke, hemorrhagic ([place]) 03/31/2011  . Alcohol withdrawal ([place]) 03/31/2011  . Polysubstance abuse 03/31/2011    Past Medical History:  Past Medical History  Diagnosis Date  . Hypertension   . Stroke [name])    Past Surgical History:  Past Surgical History  Procedure Laterality Date  . Abdominal surgery      Assessment & Plan Clinical Impression: Patient is a 65 y.o. male with history of CVA with mild LLE weakness, polysubstance abuse who was admitted on 05/08/15 with left sided weakness and left facial droop. Patient had been out drinking with friends the night before and woke up the next day with worsening of left sided weakness. UDS positive for cocaine. CT head negative and CTA head/neck showed 60% stenosis proximal R-ICA and 50% stenosis L-ICA, moderate stenosis distal B-Va and mid basilar artery. MRI brain done revealing acute non-hemorrhagic infarct in right pons and medulla. 2 D echo with EF 50-55% and no wall abnormality. EEG negative for seizure/normal study. [name] consulted and felt that stroke due  to diffuse intracranial stenosis and recommended ASA/plavix for 3 months followed by Plavix alone. Patient continues to deny cocaine use and has been counseled on abstinence. Patient with resultant LLE instability, ataxic mixed dysarthria, mild oral dysphagia, sensory deficits, visual deficits with diplopia, and dizziness affecting ability to carry out self care tasks as well as mobility. CIR recommended by MD and rehab team for follow up therapy.   Patient transferred to CIR on 05/11/2015 .   Patient currently requires mod with mobility secondary to muscle weakness, decreased cardiorespiratoy endurance, ataxia and decreased coordination and decreased problem solving.  Prior to hospitalization, patient was independent  with mobility and lived with Significant other in a [place] home.  Home access is  Media planner (lives on 16th floor).  Patient will benefit from skilled PT intervention to maximize safe functional mobility, minimize fall risk and decrease caregiver burden for planned discharge home with 24 hour supervision.  Anticipate patient will benefit from follow up [place] at discharge.  PT - End of Session Activity Tolerance: Decreased this session;Tolerates 30+ min activity with multiple rests Endurance Deficit: Yes PT Assessment Rehab Potential (ACUTE/IP ONLY): Fair Barriers to Discharge: Decreased caregiver support PT Patient demonstrates impairments in the following area(s): Balance;Endurance;Motor;Safety PT Transfers Functional Problem(s): Bed Mobility;Bed to Chair;Car PT Locomotion Functional Problem(s): Ambulation;Wheelchair Mobility;Stairs PT Plan PT Intensity: Minimum of 1-2 x/day ,45 to 90 minutes PT Frequency: 5 out of 7 days PT Duration Estimated Length of Stay: 12 to 14 days PT Treatment/Interventions: Ambulation/gait training;Balance/vestibular training;Functional mobility training;DME/adaptive equipment instruction;Community reintegration;Neuromuscular re-education;Patient/family  education;Splinting/orthotics;Therapeutic Exercise;Visual/perceptual remediation/compensation;UE/LE Coordination activities;Therapeutic Activities;Stair training;UE/LE Strength taining/ROM;Wheelchair propulsion/positioning PT Transfers Anticipated Outcome(s): mod I for transfers PT Locomotion Anticipated Outcome(s): mod I gait, mod I w/c mobility, S for stairs PT Recommendation Follow Up Recommendations: Home health PT Patient destination: Home Equipment Recommended: To be determined  Skilled Therapeutic Intervention PT evaluation completed and treatment plan initiated. Pt performed multiple sit to stand transfers with min to mod A and verbal cues. Pt ambulated 75 feet x 2 without assistive device, mod A and verbal cues. Following treatment pt returned to room. Pt transferred w/c to edge of bed with mod A and verbal cues. Pt transferred edge of bed to supine with S. Pt left sitting up in bed with call bell within reach.   PT Evaluation Precautions/Restrictions Precautions Precautions: Fall Restrictions Weight Bearing Restrictions: No General Chart Reviewed: Yes Family/Caregiver Present: No  Pain Pain Assessment Pain Assessment: No/denies pain Home Living/Prior Functioning Home Living Available Help at Discharge: Family;Available 24 hours/day Type of Home: Apartment Home Access: Elevator (lives on 16th floor) Home Layout: One level  Lives With: Significant other Prior Function Level of Independence: Independent with transfers;Independent with gait  Able to Take Stairs?: Yes Driving: No (utilizes bus for transportation) Vocation: On disability Vision/Perception As per OT evaluation. Cognition Overall Cognitive Status: Within Functional Limits for tasks assessed Arousal/Alertness: Awake/alert Orientation Level: Oriented X4 Sustained Attention: Appears intact Memory: Impaired Memory Impairment: Decreased recall of new information Awareness: Appears intact Safety/Judgment:  Appears intact Comments: Patient's visual impairment (blurred and double vision) prevented him from being able to complete complex-level medication management task Sensation Sensation Light Touch: Impaired by gross assessment Proprioception: Impaired by gross assessment Additional Comments: light touch and proprioception impaired L LE Coordination Heel Shin Test: impaired L LE Motor  Motor Motor: Ataxia;Abnormal tone  Mobility Bed Mobility Bed Mobility: Rolling Right;Rolling Left;Supine to Sit;Sit to Supine Rolling Right: 5: Supervision Rolling Left: 5: Supervision Supine to Sit: 5: Supervision Sit to Supine: 5: Supervision Transfers Transfers: Yes Sit to Stand: 4: Min assist Stand to Sit: 4: Min assist Stand Pivot Transfers: 3: Mod assist Locomotion  Ambulation Ambulation: Yes Ambulation/Gait Assistance: 3: Mod assist Ambulation Distance (Feet): 150 Feet Assistive device: 1 person hand held assist Stairs / Additional Locomotion Stairs: Yes Stairs Assistance: 3: Mod assist Stair Management Technique: Two rails Number of Stairs: 4 Height of Stairs: 6 Wheelchair Mobility Wheelchair Mobility: Yes Wheelchair Assistance: 5: Careers information officer: Both upper extremities Distance: 150  Trunk/Postural Assessment  Cervical Assessment Cervical Assessment: Within Scientist, physiological Assessment: Within Functional Limits Lumbar Assessment Lumbar Assessment: Within Functional Limits Postural Control Postural Control: Deficits on evaluation  Balance Balance Balance Assessed: Yes Static Sitting Balance Static Sitting - Balance Support: Feet supported Static Sitting - Level of Assistance: 5: Stand by assistance Static Standing Balance Static Standing - Balance Support: During functional activity Static Standing - Level of Assistance: 4: Min assist Dynamic Standing Balance Dynamic Standing - Balance Support: During functional  activity Dynamic Standing - Level of Assistance: 3: Mod assist Extremity Assessment B UEs as per OT evaluation.   RLE Assessment RLE Assessment: Within Functional Limits LLE Assessment LLE Assessment: Exceptions to WFL LLE PROM (degrees) Overall PROM Left Lower Extremity: Within functional limits for tasks assessed LLE Strength LLE Overall Strength: Deficits LLE Overall Strength Comments: grossly 3-/5 except DF 1/5   See Function Navigator for Current Functional Status.   Refer to Care Plan for Long Term Goals  Recommendations for other services: None  Discharge  Criteria: Patient will be discharged from PT if patient refuses treatment 3 consecutive times without medical reason, if treatment goals not met, if there is a change in medical status, if patient makes no progress towards goals or if patient is discharged from hospital.  The above assessment, treatment plan, treatment alternatives and goals were discussed and mutually agreed upon: by patient  Dub Amis 05/12/2015, 4:25 PM

## 2015-05-12 NOTE — Progress Notes (Signed)
65 y.o. male with history of CVA with mild LLE weakness, polysubstance abuse who was admitted on 05/08/15 with left sided weakness and left facial droop. Patient had been out drinking with friends the night before and woke up the next day with worsening of left sided weakness. UDS positive for cocaine. CT head negative and CTA head/neck showed 60% stenosis proximal R-ICA and 50% stenosis L-ICA, moderate stenosis distal B-Va and mid basilar artery. MRI brain done revealing acute non-hemorrhagic infarct in right pons and medulla.   Subjective/Complaints:   Objective: Vital Signs: Blood pressure 125/83, pulse 80, temperature 98.6 F (37 C), temperature source Oral, resp. rate 17, weight 76.023 kg (167 lb 9.6 oz), SpO2 96 %. No results found. Results for orders placed or performed during the hospital encounter of 05/11/15 (from the past 72 hour(s))  CBC WITH DIFFERENTIAL     Status: None   Collection Time: 05/12/15  5:45 AM  Result Value Ref Range   WBC 6.6 4.0 - 10.5 K/uL   RBC 4.73 4.22 - 5.81 MIL/uL   Hemoglobin 13.9 13.0 - 17.0 g/dL   HCT 43.4 39.0 - 52.0 %   MCV 91.8 78.0 - 100.0 fL   MCH 29.4 26.0 - 34.0 pg   MCHC 32.0 30.0 - 36.0 g/dL   RDW 12.2 11.5 - 15.5 %   Platelets 227 150 - 400 K/uL   Neutrophils Relative % 55 %   Neutro Abs 3.6 1.7 - 7.7 K/uL   Lymphocytes Relative 32 %   Lymphs Abs 2.1 0.7 - 4.0 K/uL   Monocytes Relative 11 %   Monocytes Absolute 0.7 0.1 - 1.0 K/uL   Eosinophils Relative 2 %   Eosinophils Absolute 0.1 0.0 - 0.7 K/uL   Basophils Relative 0 %   Basophils Absolute 0.0 0.0 - 0.1 K/uL  Comprehensive metabolic panel     Status: Abnormal   Collection Time: 05/12/15  5:45 AM  Result Value Ref Range   Sodium 140 135 - 145 mmol/L   Potassium 3.7 3.5 - 5.1 mmol/L   Chloride 105 101 - 111 mmol/L   CO2 25 22 - 32 mmol/L   Glucose, Bld 80 65 - 99 mg/dL   BUN 12 6 - 20 mg/dL   Creatinine, Ser 1.36 (H) 0.61 - 1.24 mg/dL   Calcium 9.3 8.9 - 10.3 mg/dL   Total  Protein 6.7 6.5 - 8.1 g/dL   Albumin 3.4 (L) 3.5 - 5.0 g/dL   AST 17 15 - 41 U/L   ALT 14 (L) 17 - 63 U/L   Alkaline Phosphatase 49 38 - 126 U/L   Total Bilirubin 0.9 0.3 - 1.2 mg/dL   GFR calc non Af Amer 54 (L) >60 mL/min   GFR calc Af Amer >60 >60 mL/min    Comment: (NOTE) The eGFR has been calculated using the CKD EPI equation. This calculation has not been validated in all clinical situations. eGFR's persistently <60 mL/min signify possible Chronic Kidney Disease.    Anion gap 10 5 - 15     HEENT: eye patch Cardio: RRR and no murmur Resp: CTA B/L and unlabored GI: BS positive and NT, ND Extremity:  Pulses positive and No Edema Skin:   Intact Neuro: Alert/Oriented, Cranial Nerve Abnormalities dyscongugate gaze to Right, Normal Sensory, Abnormal Motor 4-/5 LLE 5/5 RUE, 4+ in BUE and Abnormal FMC Ataxic/ dec FMC Musc/Skel:  Other no pain with UE or LE ROM Gen NAD   Assessment/Plan: 1. Functional deficits secondary to Right  ponto medullary infarct with Left sided ataxia/fine motor def, LLE weakness and dysconjugate  which require 3+ hours per day of interdisciplinary therapy in a comprehensive inpatient rehab setting. Physiatrist is providing close team supervision and 24 hour management of active medical problems listed below. Physiatrist and rehab team continue to assess barriers to discharge/monitor patient progress toward functional and medical goals. FIM:    Function - Lower Body Dressing/Undressing Assist for lower body dressing:  (difficulty with coordination of the LLE )                             Medical Problem List and Plan: 1.  Left hemiparesis and cognitive/visual deficits secondary to right ponto-medullary infarct-Initiate CIR today 2.  DVT Prophylaxis/Anticoagulation: Pharmaceutical: Lovenox- no clinical sign DVT 3. Pain Management: N/A 4. Mood: LCSW to follow for evaluatin and support.   5. Neuropsych: This patient is capable of making  decisions on his own behalf. 6. Skin/Wound Care: routine pressure relief measures. Patient able to reposition himself in bed without difficulty.   7. Fluids/Electrolytes/Nutrition: Monitor I/O. Check lytes in am.   8. HTN: Monitor BP tid. Allow for higher blood pressure to allow adequate perfusion. Lisinopril and amlodipine on hold at at this time.   9. Dyslipidemia: continue Zocor.   10. Polysubstance abuse: On CIWA protocol. No signs of withdrawal reported/noted.  11. URI: Will add nasal spray to help with congestion and schedule cough medication. Nebs prn.   12. Constipation: Will order Miralax today and schedule Senna S daily at bedtime.  Had hard BM this am   LOS (Days) 1 A FACE TO FACE EVALUATION WAS PERFORMED  Tyrie Porzio E 05/12/2015, 10:59 AM

## 2015-05-12 NOTE — Progress Notes (Signed)
Occupational Therapy Assessment and Plan  Patient Details  Name: Andre [name] MRN: [id number] Date of Birth: 22-Apr-1951  OT Diagnosis: abnormal posture, ataxia, disturbance of vision, hemiplegia affecting non-dominant side and muscle weakness (generalized) Rehab Potential: Rehab Potential (ACUTE ONLY): Good ELOS: 12-14 days   Today's Date: 05/12/2015 OT Individual Time: 0900-1000 OT Individual Time Calculation (min): 60 min     Problem List:  Patient Active Problem List   Diagnosis Date Noted  . Cerebral infarction due to thrombosis of right posterior cerebral artery ([place]) 05/11/2015  . Left hemiparesis ([place]) 05/11/2015  . Essential hypertension 05/09/2015  . Stroke ([place]) 05/09/2015  . Tobacco use disorder   . Cocaine abuse   . HLD (hyperlipidemia)   . ETOH abuse   . History of CVA with residual deficit   . Dysphagia as late effect of cerebrovascular disease   . Acute ischemic stroke ([place]) 05/08/2015  . Left-sided weakness   . Alcohol abuse   . Stroke, hemorrhagic ([place]) 03/31/2011  . Alcohol withdrawal ([place]) 03/31/2011  . Polysubstance abuse 03/31/2011    Past Medical History:  Past Medical History  Diagnosis Date  . Hypertension   . Stroke [facility])    Past Surgical History:  Past Surgical History  Procedure Laterality Date  . Abdominal surgery      Assessment & Plan Clinical Impression: history of chronic migraine headaches followed at the pain clinic, diabetes mellitus, hypertension. Currently separated from his wife and presently living with his youngest daughter for the past 6 months. Presented 05/07/2015 with altered mental status as well as headache. Per EMS patient had a 50 count difference in blood pressure in each arm in route to the ED. Denies any recent trauma. MRI of the brain showed a large left frontal hemorrhage measuring 36 x 22 mm unchanged from prior tracing of CT. CTA of head negative for vascular malformation. Echocardiogram with ejection fraction  of 36% grade 1 diastolic dysfunction. Carotid Dopplers with no ICA stenosis. Neurology follow-up close monitoring of blood pressure. Subcutaneous heparin added for DVT prophylaxis 05/09/2015. CTA of neck ultimately ordered which revealed left subclavian occlusion and reconstitution proximal to the left vertebral artery c/w subclavian steal phenomenon. Tolerating a regular consistency diet. Physical and occupational therapy evaluations completed 05/10/2015 with recommendations of physical medicine rehabilitation cons Patient transferred to CIR on 05/11/2015 .    Patient currently requires mod with basic self-care skills secondary to muscle weakness and muscle joint tightness, impaired timing and sequencing, abnormal tone and unbalanced muscle activation, right side neglect and central origin.  Prior to hospitalization, patient could complete BADL with independent .  Patient will benefit from skilled intervention to increase independence with basic self-care skills prior to discharge home with care partner.  Anticipate patient will require intermittent supervision and follow up home health.  OT - End of Session Activity Tolerance: Tolerates 30+ min activity with multiple rests Endurance Deficit: Yes OT Assessment Rehab Potential (ACUTE ONLY): Good OT Patient demonstrates impairments in the following area(s): Balance;Endurance;Motor;Perception;Safety;Vision OT Basic ADL's Functional Problem(s): Grooming;Bathing;Dressing;Toileting OT Transfers Functional Problem(s): Toilet;Tub/Shower OT Additional Impairment(s): Fuctional Use of Upper Extremity OT Plan OT Intensity: Minimum of 1-2 x/day, 45 to 90 minutes OT Frequency: 5 out of 7 days OT Duration/Estimated Length of Stay: 12-14 days OT Treatment/Interventions: Balance/vestibular training;Discharge planning;DME/adaptive equipment instruction;Functional mobility training;Neuromuscular re-education;Patient/family education;Self Care/advanced ADL  retraining;Therapeutic Activities;Therapeutic Exercise;UE/LE Strength taining/ROM;UE/LE Coordination activities;Visual/perceptual remediation/compensation OT Self Feeding Anticipated Outcome(s): independent OT Basic Self-Care Anticipated Outcome(s): supervision OT Toileting Anticipated Outcome(s): supervision OT Bathroom  Transfers Anticipated Outcome(s): supervision OT Recommendation Patient destination: Home Follow Up Recommendations: Home health OT Equipment Recommended: To be determined   Skilled Therapeutic Intervention Skilled OT intervention with treatment focus on the following: balance, vision, UE AROM, transfers.  Pt with diplopia, and blurred vision.  Unable to track eyes to midline and are deviated to the left.  Pt aware of deficits and able to verbalize 80 %.  Transferred to shower chair and performed bathing with min assist for dynamic sitting and standing balance using grab bar.  Explained OT POC and goals.  Pt in agreement.     OT Evaluation Precautions/Restrictions  Precautions Precautions: Fall Restrictions Weight Bearing Restrictions: No     Pain  none   Home Living/Prior Functioning Home Living Available Help at Discharge: Available 24 hours/day Type of Home: Apartment Home Access: Elevator Home Layout: One level Bathroom Shower/Tub: Tub/shower unit, Architectural technologist: Handicapped height Bathroom Accessibility: Yes Additional Comments:  (uses cane qd)  Lives With: Significant other, Other (Comment) (20 years per significant other's report with pt present) IADL History Current License: No Mode of Transportation: Bus Education: reported that he completed 6th grade Occupation:  (on Fish farm manager) Leisure and Hobbies:  Psychologist, clinical) Prior Function Level of Independence: Independent with transfers, Independent with gait  Able to Take Stairs?: Reciprically Driving: No (utilizes bus for transportation) Vocation: On disability Comments: cane for ambulation,  no assist ADLs ADL   Vision/Perception  Vision- History Baseline Vision/History:  ("I should have glasses.") Patient Visual Report: Diplopia;Peripheral vision impairment Vision- Assessment Vision Assessment?: Yes;Vision impaired- to be further tested in functional context Eye Alignment: Impaired (comment) Ocular Range of Motion: Other (comment);Restricted on the right;Restricted looking up;Restricted looking down (impaired left eye) Alignment/Gaze Preference: Gaze left;Other (comment) (even with patch on left eye) Tracking/Visual Pursuits: Left eye does not track medially;Requires cues, head turns, or add eye shifts to track;Impaired - to be further tested in functional context;Decreased smoothness of horizontal tracking;Decreased smoothness of vertical tracking;Decreased smoothness of eye movement to RIGHT superior field;Decreased smoothness of eye movement to RIGHT inferior field Saccades: Impaired - to be further tested in functional context;Additional head turns occurred during testing Convergence: Impaired - to be further tested in functional context (decreased  supervior vision) Visual Fields: Left visual field deficit;Right visual field deficit;Impaired-to be further tested in functional context;Left superior homonymous quadranopsia Diplopia Assessment: Disappears with one eye closed Depth Perception: Undershoots Perception Perception: Within Functional Limits Praxis Praxis: Not tested  Cognition Overall Cognitive Status: Within Functional Limits for tasks assessed Arousal/Alertness: Awake/alert Orientation Level: Person;Place;Situation Person: Oriented Place: Oriented Situation: Oriented Year: 2017 Month: January Day of Week: Correct Memory: Impaired (can't remember details) Memory Impairment: Decreased recall of new information;Storage deficit;Decreased short term memory (pt recalled 3/5 words independently within 5 minutes, 2 with cues ) Decreased Short Term Memory: Verbal  basic;Functional basic Memory Recall: Sock;Blue;Bed Memory Recall Sock: Without Cue Memory Recall Blue: Without Cue Memory Recall Bed: Without Cue Attention: Selective Sustained Attention: Appears intact Selective Attention: Appears intact Awareness: Appears intact (aware to need to call for assist to prevent falls due to left sided weakness) Safety/Judgment: Appears intact Comments: abstract thinking deficits noted with pt able to identify basic similarities between items eg: food vs fruit, noise maker vs musical instruments Sensation intact Sensation Light Touch: Impaired by gross assessment Proprioception: Impaired by gross assessment Additional Comments: light touch and proprioception impaired L LE Coordination Gross Motor Movements are Fluid and Coordinated: No Fine Motor Movements are Fluid and Coordinated: No Heel  Shin Test: impaired L LE Motor  Motor Motor: Ataxia;Abnormal postural alignment and control Mobility  Bed Mobility Bed Mobility: Rolling Right;Rolling Left;Supine to Sit;Sit to Supine Rolling Right: 5: Supervision Rolling Left: 5: Supervision Supine to Sit: 5: Supervision Sit to Supine: 5: Supervision Transfers Sit to Stand: 4: Min assist Stand to Sit: 4: Min assist  Trunk/Postural Assessment  Cervical Assessment Cervical Assessment: Within Functional Limits Thoracic Assessment Thoracic Assessment: Within Functional Limits Lumbar Assessment Lumbar Assessment: Within Functional Limits Postural Control Postural Control: Deficits on evaluation Protective Responses: slow   Balance Balance Balance Assessed: Yes Static Sitting Balance Static Sitting - Balance Support: Feet supported Static Sitting - Level of Assistance: 5: Stand by assistance Dynamic Sitting Balance Sitting balance - Comments: in unsupported seating briefly in preparation to stand, c/o dizziness Static Standing Balance Static Standing - Balance Support: During functional activity Static  Standing - Level of Assistance: 4: Min assist Dynamic Standing Balance Dynamic Standing - Balance Support: During functional activity Dynamic Standing - Level of Assistance: 3: Mod assist Extremity/Trunk Assessment RUE Assessment RUE Assessment: Within Functional Limits LUE Assessment LUE Assessment: Exceptions to [facility] LUE Strength LUE Overall Strength: Deficits   See Function Navigator for Current Functional Status.   Refer to Care Plan for Long Term Goals  Recommendations for other services: None  Discharge Criteria: Patient will be discharged from OT if patient refuses treatment 3 consecutive times without medical reason, if treatment goals not met, if there is a change in medical status, if patient makes no progress towards goals or if patient is discharged from hospital.  The above assessment, treatment plan, treatment alternatives and goals were discussed and mutually agreed upon: by patient  Lisa Roca 05/12/2015, 7:33 PM

## 2015-05-12 NOTE — Evaluation (Signed)
Speech Language Pathology Assessment and Plan  Patient Details  Name: Andre [name] MRN: [id number] Date of Birth: Jul 26, 1950  SLP Diagnosis: Dysarthria;Dysphagia  Rehab Potential: Good ELOS: 10-12 days    Today's Date: 05/12/2015 SLP Individual Time: 1100-1200 SLP Individual Time Calculation (min): 60 min   Problem List:  Patient Active Problem List   Diagnosis Date Noted  . Cerebral infarction due to thrombosis of right posterior cerebral artery ([place]) 05/11/2015  . Left hemiparesis ([place]) 05/11/2015  . Essential hypertension 05/09/2015  . Stroke ([place]) 05/09/2015  . Tobacco use disorder   . Cocaine abuse   . HLD (hyperlipidemia)   . ETOH abuse   . History of CVA with residual deficit   . Dysphagia as late effect of cerebrovascular disease   . Acute ischemic stroke ([place]) 05/08/2015  . Left-sided weakness   . Alcohol abuse   . Stroke, hemorrhagic ([place]) 03/31/2011  . Alcohol withdrawal ([place]) 03/31/2011  . Polysubstance abuse 03/31/2011   Past Medical History:  Past Medical History  Diagnosis Date  . Hypertension   . Stroke [facility])    Past Surgical History:  Past Surgical History  Procedure Laterality Date  . Abdominal surgery      Assessment / Plan / Recommendation Clinical Impression Patient presents with a mild oral dysphagia characterized by decreased mastication and oral control of bolus, with patient reporting decreased sensation on left side of face. Patient also exhibits a mild, mixed dysarthria, which results in decreased speech intelligibility at phrase and sentence levels from decreased lingual ROM and strength as well as decreased bilabial ROM. He also exhibits decreased respiratory support and control, resulting in low vocal intensity and mild hoarse, congested vocal quality. Patient reports that he has been coughing and expectorating phlegm since he got to hospital. Patient's deficits impact his ability to effectively communicate and safely consume solid  textures and will require speech-language pathologist intervention in order to improve his overall function.  Skilled Therapeutic Interventions            SLP Assessment  Patient will need skilled Speech Lanaguage Pathology Services during CIR admission    Recommendations  SLP Diet Recommendations: Dysphagia 3 (Mech soft);Thin Medication Administration: Whole meds with liquid Supervision: Patient able to self feed;Intermittent supervision to cue for compensatory strategies Compensations: Slow rate;Small sips/bites Postural Changes and/or Swallow Maneuvers: Seated upright 90 degrees;Upright 30-60 min after meal Oral Care Recommendations: Oral care BID Patient destination: Home Follow up Recommendations: None Equipment Recommended: None recommended by SLP    SLP Frequency     SLP Duration  SLP Intensity  SLP Treatment/Interventions  3-5 out of 7 days    1-2 times/day, 30-90 minutes  Speech/Language facilitation;Cueing hierarchy;Therapeutic Activities;Functional tasks;Therapeutic Exercise;Dysphagia/aspiration precaution training;Medication managment;Patient/family education    Pain Pain Assessment Pain Assessment: No/denies pain  Prior Functioning Cognitive/Linguistic Baseline: Baseline deficits (Patient reported he has been on disability since initial CVA in 2014) Type of Home: Apartment  Lives With: Significant other;Other (Comment) Education: reported that he completed 6th grade Vocation: On disability  Function:  Eating Eating                 Cognition Comprehension Comprehension assist level: Follows basic conversation/direction with no assist  Expression   Expression assist level: Expresses basic 90% of the time/requires cueing < 10% of the time.  Social Interaction Social Interaction assist level: Interacts appropriately 90% of the time - Needs monitoring or encouragement for participation or interaction.  Problem Solving Problem solving assist level: Solves  basic 90% of the time/requires cueing < 10% of the time  Memory Memory assist level: Recognizes or recalls 90% of the time/requires cueing < 10% of the time   Short Term Goals: Week 1: SLP Short Term Goal 1 (Week 1): Patient will tolerate trial trays of regular solids, thin liquids with no overt s/s of aspiration. SLP Short Term Goal 2 (Week 1): Patient will be able to utilize learned strategies to increase speech intelligibility at phrase level to 90% accuracy for two consecutive sessions. SLP Short Term Goal 3 (Week 1): Patient will demonstrate ability to complete complex-level medication management task with 90% accuracy prior to discharge. SLP Short Term Goal 4 (Week 1): Patient will be able to increase and maintain adequate vocal intensity at phrase level, following clinician education and training for strategies (respiratory support/control, etc).  Refer to Care Plan for Long Term Goals  Recommendations for other services: None  Discharge Criteria: Patient will be discharged from SLP if patient refuses treatment 3 consecutive times without medical reason, if treatment goals not met, if there is a change in medical status, if patient makes no progress towards goals or if patient is discharged from hospital.  The above assessment, treatment plan, treatment alternatives and goals were discussed and mutually agreed upon: by patient  Dannial Monarch 05/12/2015, 3:42 PM  Sonia Baller, MA, CCC-SLP 05/12/2015 3:42 PM

## 2015-05-13 ENCOUNTER — Inpatient Hospital Stay (HOSPITAL_COMMUNITY): Payer: Medicare Other | Admitting: Physical Therapy

## 2015-05-13 LAB — CULTURE, BLOOD (ROUTINE X 2)
CULTURE: NO GROWTH
CULTURE: NO GROWTH

## 2015-05-13 NOTE — Progress Notes (Signed)
Physical Therapy Session Note  Patient Details  Name: Andre [name] MRN: [id number] Date of Birth: Apr 15, 1951  Today's Date: 05/13/2015 PT Individual Time: [id number] PT Individual Time Calculation (min): 45 min   Short Term Goals: Week 1:  PT Short Term Goal 1 (Week 1): Pt will increase bed mobility to mod I.  PT Short Term Goal 2 (Week 1): Pt will increase transfers to min A.  PT Short Term Goal 3 (Week 1): Pt will increase ambulation with LRAD to min A about 150 feet.  PT Short Term Goal 4 (Week 1): Pt will ascend/descend 4 stairs with B rails and min A.  PT Short Term Goal 5 (Week 1): Pt will propel w/c about 150 feet with mod I.   Skilled Therapeutic Interventions/Progress Updates:  Pt was seen bedside in the am. Pt transferred supine to edge of bed with S. Pt transferred edge of bed to w/c with min A and verbal cues. Pt propelled w/c to gym with B UEs and S. Pt ambulated 75 and 150 feet without assistive device and min A with verbal cues. Pt performed multiple sit to stand transfers with min A and verbal cues. Treatment focused on NMR, utilizing step taps and alternating step taps, 3 sets x 10 reps each. Pt propelled w/c back to room with B UEs and S. Pt performed toilet transfers with min A and verbal cues. Pt left sitting up in recliner with call bell within reach.   Therapy Documentation Precautions:  Precautions Precautions: Fall Restrictions Weight Bearing Restrictions: No General:   Pain: No c/o pain.   See Function Navigator for Current Functional Status.   Therapy/Group: Individual Therapy  Dub Amis 05/13/2015, 12:48 PM

## 2015-05-13 NOTE — Progress Notes (Signed)
65 y.o. male with history of CVA with mild LLE weakness, polysubstance abuse who was admitted on 05/08/15 with left sided weakness and left facial droop. Patient had been out drinking with friends the night before and woke up the next day with worsening of left sided weakness. UDS positive for cocaine. CT head negative and CTA head/neck showed 60% stenosis proximal R-ICA and 50% stenosis L-ICA, moderate stenosis distal B-Va and mid basilar artery. MRI brain done revealing acute non-hemorrhagic infarct in right pons and medulla.   Subjective/Complaints: Patient denies any new complaints today  Review of systems denies any chest pain shortness breath nausea vomiting diarrhea or constipation  Objective: Vital Signs: Blood pressure 130/78, pulse 64, temperature 98.5 F (36.9 C), temperature source Oral, resp. rate 17, weight 76.023 kg (167 lb 9.6 oz), SpO2 96 %. No results found. Results for orders placed or performed during the hospital encounter of 05/11/15 (from the past 72 hour(s))  CBC WITH DIFFERENTIAL     Status: None   Collection Time: 05/12/15  5:45 AM  Result Value Ref Range   WBC 6.6 4.0 - 10.5 K/uL   RBC 4.73 4.22 - 5.81 MIL/uL   Hemoglobin 13.9 13.0 - 17.0 g/dL   HCT 43.4 39.0 - 52.0 %   MCV 91.8 78.0 - 100.0 fL   MCH 29.4 26.0 - 34.0 pg   MCHC 32.0 30.0 - 36.0 g/dL   RDW 12.2 11.5 - 15.5 %   Platelets 227 150 - 400 K/uL   Neutrophils Relative % 55 %   Neutro Abs 3.6 1.7 - 7.7 K/uL   Lymphocytes Relative 32 %   Lymphs Abs 2.1 0.7 - 4.0 K/uL   Monocytes Relative 11 %   Monocytes Absolute 0.7 0.1 - 1.0 K/uL   Eosinophils Relative 2 %   Eosinophils Absolute 0.1 0.0 - 0.7 K/uL   Basophils Relative 0 %   Basophils Absolute 0.0 0.0 - 0.1 K/uL  Comprehensive metabolic panel     Status: Abnormal   Collection Time: 05/12/15  5:45 AM  Result Value Ref Range   Sodium 140 135 - 145 mmol/L   Potassium 3.7 3.5 - 5.1 mmol/L   Chloride 105 101 - 111 mmol/L   CO2 25 22 - 32 mmol/L    Glucose, Bld 80 65 - 99 mg/dL   BUN 12 6 - 20 mg/dL   Creatinine, Ser 1.36 (H) 0.61 - 1.24 mg/dL   Calcium 9.3 8.9 - 10.3 mg/dL   Total Protein 6.7 6.5 - 8.1 g/dL   Albumin 3.4 (L) 3.5 - 5.0 g/dL   AST 17 15 - 41 U/L   ALT 14 (L) 17 - 63 U/L   Alkaline Phosphatase 49 38 - 126 U/L   Total Bilirubin 0.9 0.3 - 1.2 mg/dL   GFR calc non Af Amer 54 (L) >60 mL/min   GFR calc Af Amer >60 >60 mL/min    Comment: (NOTE) The eGFR has been calculated using the CKD EPI equation. This calculation has not been validated in all clinical situations. eGFR's persistently <60 mL/min signify possible Chronic Kidney Disease.    Anion gap 10 5 - 15     HEENT: eye patch Cardio: RRR and no murmur Resp: CTA B/L and unlabored GI: BS positive and NT, ND Extremity:  Pulses positive and No Edema Skin:   Intact Neuro: Alert/Oriented, Cranial Nerve Abnormalities dyscongugate gaze to Right, Normal Sensory, Abnormal Motor 4-/5 LLE 5/5 RUE, 4+ in BUE and Abnormal [facility] Ataxic/ dec [facility] Musc/Skel:  Other no pain with UE or LE ROM Gen NAD   Assessment/Plan: 1. 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 Physiatrist is providing close team supervision and 24 hour management of active medical problems listed below. Physiatrist and rehab team continue to assess barriers to discharge/monitor patient progress toward functional and medical goals. FIM: Function - Bathing Position: Shower Body parts bathed by patient: Right arm, Left arm, Chest, Abdomen, Front perineal area, Right upper leg, Left upper leg Body parts bathed by helper: Buttocks, Left lower leg, Back, Right lower leg Assist Level: Touching or steadying assistance(Pt > 75%)  Function- Upper Body Dressing/Undressing What is the patient wearing?: Pull over  shirt/dress Pull over shirt/dress - Perfomed by patient: Thread/unthread right sleeve, Thread/unthread left sleeve, Put head through opening, Pull shirt over trunk Assist Level: Touching or steadying assistance(Pt > 75%) Function - Lower Body Dressing/Undressing What is the patient wearing?: Underwear, Non-skid slipper socks, Pants Position: Wheelchair/chair at sink Underwear - Performed by patient: Thread/unthread right underwear leg, Thread/unthread left underwear leg Underwear - Performed by helper: Pull underwear up/down Pants- Performed by patient: Thread/unthread right pants leg, Thread/unthread left pants leg, Pull pants up/down Pants- Performed by helper: Pull pants up/down Non-skid slipper socks- Performed by helper: Don/doff right sock, Don/doff left sock Assist for footwear: Partial/moderate assist Assist for lower body dressing: Touching or steadying assistance (Pt > 75%)  Function - Toileting Toileting steps completed by patient: Adjust clothing prior to toileting, Performs perineal hygiene Toileting steps completed by helper: Adjust clothing after toileting Toileting Assistive Devices: Grab bar or rail Assist level: Touching or steadying assistance (Pt.75%)  Function - Air cabin crew transfer assistive device: Mechanical lift Mechanical lift: Stedy (prior to eval)  Function - Chair/bed transfer Chair/bed transfer method: Stand pivot, Ambulatory Chair/bed transfer assist level: Moderate assist (Pt 50 - 74%/lift or lower)  Function - Locomotion: Wheelchair Will patient use wheelchair at discharge?: Yes Type: Manual Max wheelchair distance: 150 Assist Level: Supervision or verbal cues Assist Level: Supervision or verbal cues Assist Level: Supervision or verbal cues Function - Locomotion: Ambulation Assistive device: No device Max distance: 150 Assist level: Moderate assist (Pt 50 - 74%) Assist level: Moderate assist (Pt 50 - 74%) Assist level: Moderate  assist (Pt 50 - 74%) Assist level: Moderate assist (Pt 50 - 74%) Assist level: Moderate assist (Pt 50 - 74%)  Function - Comprehension Comprehension: Auditory Comprehension assist level: Follows basic conversation/direction with no assist  Function - Expression Expression: Verbal Expression assist level: Expresses basic 90% of the time/requires cueing < 10% of the time.  Function - Social Interaction Social Interaction assist level: Interacts appropriately 90% of the time - Needs monitoring or encouragement for participation or interaction.  Function - Problem Solving Problem solving assist level: Solves basic 90% of the time/requires cueing < 10% of the time  Function - Memory Memory assist level: Recognizes or recalls 90% of the time/requires cueing < 10% of the time Patient normally able to recall (first 3 days only): Current season, Location of own room, Staff names and faces, That he or she is in a hospital  Medical Problem List and Plan: 1.  Left hemiparesis and cognitive/visual deficits secondary to right ponto-medullary infarct-tolerating CIR thus far 2.  DVT Prophylaxis/Anticoagulation: Pharmaceutical: Lovenox- no clinical sign DVT 3. Pain Management: N/A 4. Mood: LCSW to follow for evaluatin  and support.   5. Neuropsych: This patient is capable of making decisions on his own behalf. 6. Skin/Wound Care: routine pressure relief measures. Patient able to reposition himself in bed without difficulty.   7. Fluids/Electrolytes/Nutrition: Monitor I/O. Check lytes in am.   8. HTN: Monitor BP tid. Allow for higher blood pressure to allow adequate perfusion. Lisinopril and amlodipine on hold at at this time.  blood pressure 130/78 this morning 9. Dyslipidemia: continue Zocor.   10. Polysubstance abuse: On CIWA protocol. No signs of withdrawal reported/noted.  11. URI: Will add nasal spray to help with congestion and schedule cough medication. Nebs prn.   12. Constipation: Will order  Miralax today and schedule Senna S daily at bedtime.  formed bowel movement yesterday evening   LOS (Days) 2 A FACE TO FACE EVALUATION WAS PERFORMED  Jalyssa Fleisher E 05/13/2015, 11:05 AM

## 2015-05-14 ENCOUNTER — Inpatient Hospital Stay (HOSPITAL_COMMUNITY): Payer: Medicare Other | Admitting: Physical Therapy

## 2015-05-14 ENCOUNTER — Inpatient Hospital Stay (HOSPITAL_COMMUNITY): Payer: Medicare Other | Admitting: Speech Pathology

## 2015-05-14 ENCOUNTER — Inpatient Hospital Stay (HOSPITAL_COMMUNITY): Payer: Medicare Other | Admitting: Occupational Therapy

## 2015-05-14 NOTE — Progress Notes (Signed)
Occupational Therapy Session Note  Patient Details  Name: Andre [name] MRN: [id number] Date of Birth: Apr 20, 1951  Today's Date: 05/14/2015 OT Individual Time: 1000-1130 OT Individual Time Calculation (min): 90 min    Short Term Goals: Week 1:  OT Short Term Goal 1 (Week 1): Pt will maintain head in midline for 5 minutes duting activity OT Short Term Goal 2 (Week 1): Pt. will groom self with min assist OT Short Term Goal 3 (Week 1): Pt will bathe welf with min assit OT Short Term Goal 4 (Week 1): Pt. will dress self with min assist OT Short Term Goal 5 (Week 1): Pt will transfer to toilet with min assist  Skilled Therapeutic Interventions/Progress Updates:    Treatment session with focus on further visual assessment and treatment.  Pt with inability to track passed midline to Rt with neither Rt nor Lt eye.  Suspect pt with field cut due to attempts to compensate with head turn when attempting to locate items and clock drawing WNL and line bisection WNL in 5 of 6 lines.  However with horizontal scanning on paper task, pt unable to locate underlined letters to Rt of midline.  Completed number cancellation on mirror with pt standing and visually scanning mirror to erase numbers in all visual fields with min assist for standing balance.  Ambulated forwards and backwards in hallway with focus on scanning to Rt to select colored dots on wall to address Rt attention and awareness of Rt visual field.  Pt required min assist with ambulation with direction changes.  Completed toilet transfer with min assist at end of session and supervision for toileting tasks.  Therapy Documentation Precautions:  Precautions Precautions: Fall Restrictions Weight Bearing Restrictions: No General:   Vital Signs: Therapy Vitals Temp: 99.2 F (37.3 C) Temp Source: Oral Pulse Rate: 74 Resp: 18 BP: 117/74 mmHg Patient Position (if appropriate): Sitting Oxygen Therapy SpO2: 95 % O2 Device: Not  Delivered Pain: Pain Assessment Pain Assessment: No/denies pain  See Function Navigator for Current Functional Status.   Therapy/Group: Individual Therapy  Simonne Come 05/14/2015, 3:25 PM

## 2015-05-14 NOTE — Progress Notes (Signed)
Physical Therapy Session Note  Patient Details  Name: Andre [name] MRN: [id number] Date of Birth: 12-Mar-1951  Today's Date: 05/14/2015 PT Individual Time: 0815-0900 PT Individual Total Time: 45 min  Short Term Goals: Week 1:  PT Short Term Goal 1 (Week 1): Pt will increase bed mobility to mod I.  PT Short Term Goal 2 (Week 1): Pt will increase transfers to min A.  PT Short Term Goal 3 (Week 1): Pt will increase ambulation with LRAD to min A about 150 feet.  PT Short Term Goal 4 (Week 1): Pt will ascend/descend 4 stairs with B rails and min A.  PT Short Term Goal 5 (Week 1): Pt will propel w/c about 150 feet with mod I.   Skilled Therapeutic Interventions/Progress Updates:    Pt received seated in bed, no c/o pain and declining therapy until he finished breakfast; missed 15 minutes PT. W/c propulsion 2x200' with BUEs for endurance and strengthening. In w/c room, pt stood with min guard to place cushion in w/c. Seated card matching task to assess visual scanning, attention. Note R inattention, questionable R field cut with B eyes able to track from L side to midline only. With pt compensating with head turning to R, eyes remain in midline of body. Assessed Berg Balance scale with results as below. Returned to room in w/c totalA for energy conservation. Stand pivot to return to bed with min guard; remained seated in bed with all needs in reach at completion of session.   Therapy Documentation Precautions:  Precautions Precautions: Fall Restrictions Weight Bearing Restrictions: No General:  Missed Time: 15 min- pt refusal, late breakfast tray Pain: Pain Assessment Pain Assessment: No/denies pain Pain Score: 0-No pain   Standardized Balance Assessment Standardized Balance Assessment: Berg Balance Test Berg Balance Test Sit to Stand: Able to stand  independently using hands Standing Unsupported: Able to stand 2 minutes with supervision Sitting with Back Unsupported but Feet  Supported on Floor or Stool: Able to sit safely and securely 2 minutes Stand to Sit: Uses backs of legs against chair to control descent Transfers: Needs one person to assist Standing Unsupported with Eyes Closed: Able to stand 10 seconds with supervision Standing Ubsupported with Feet Together: Able to place feet together independently but unable to hold for 30 seconds From Standing, Reach Forward with Outstretched Arm: Loses balance while trying/requires external support From Standing Position, Pick up Object from Floor: Unable to pick up and needs supervision From Standing Position, Turn to Look Behind Over each Shoulder: Needs supervision when turning Turn 360 Degrees: Needs assistance while turning Standing Unsupported, Alternately Place Feet on Step/Stool: Needs assistance to keep from falling or unable to try Standing Unsupported, One Foot in Front: Needs help to step but can hold 15 seconds Standing on One Leg: Tries to lift leg/unable to hold 3 seconds but remains standing independently Total Score: 22  See Function Navigator for Current Functional Status.   Therapy/Group: Individual Therapy  Luberta Mutter 05/14/2015, 3:45 PM

## 2015-05-14 NOTE — Evaluation (Signed)
Recreational Therapy Assessment and Plan  Patient Details  Name: Andre [name] MRN: [id number] Date of Birth: 03-15-1951 Today's Date: 05/14/2015  Rehab Potential: Good ELOS: 10 days   Assessment Clinical Impression:  Problem List:  Patient Active Problem List   Diagnosis Date Noted  . Cerebral infarction due to thrombosis of right posterior cerebral artery ([place]) 05/11/2015  . Left hemiparesis ([place]) 05/11/2015  . Essential hypertension 05/09/2015  . Stroke ([place]) 05/09/2015  . Tobacco use disorder   . Cocaine abuse   . HLD (hyperlipidemia)   . ETOH abuse   . History of CVA with residual deficit   . Dysphagia as late effect of cerebrovascular disease   . Acute ischemic stroke ([place]) 05/08/2015  . Left-sided weakness   . Alcohol abuse   . Stroke, hemorrhagic ([place]) 03/31/2011  . Alcohol withdrawal ([place]) 03/31/2011  . Polysubstance abuse 03/31/2011    Past Medical History:  Past Medical History  Diagnosis Date  . Hypertension   . Stroke [facility])    Past Surgical History:  Past Surgical History  Procedure Laterality Date  . Abdominal surgery      Assessment & Plan Clinical Impression: history of chronic migraine headaches followed at the pain clinic, diabetes mellitus, hypertension. Currently separated from his wife and presently living with his youngest daughter for the past 6 months. Presented 05/07/2015 with altered mental status as well as headache. Per EMS patient had a 50 count difference in blood pressure in each arm in route to the ED. Denies any recent trauma. MRI of the brain showed a large left frontal hemorrhage measuring 36 x 22 mm unchanged from prior tracing of CT. CTA of head negative for vascular malformation. Echocardiogram with ejection fraction of 97% grade 1 diastolic dysfunction. Carotid Dopplers with no ICA stenosis. Neurology follow-up close monitoring of blood pressure. Subcutaneous  heparin added for DVT prophylaxis 05/09/2015. CTA of neck ultimately ordered which revealed left subclavian occlusion and reconstitution proximal to the left vertebral artery c/w subclavian steal phenomenon. Tolerating a regular consistency diet. Physical and occupational therapy evaluations completed 05/10/2015 with recommendations of physical medicine rehabilitation cons Patient transferred to CIR on 05/11/2015 .      Pt presents with decreased activity tolerance, decreased functional mobility, decreased balance, decreased vision, right inattention Limiting pt's independence with leisure/community pursuits.  Leisure History/Participation Premorbid leisure interest/current participation: Games - Cards;Community - Other (Comment);Community - Doctor, hospital - Production designer, theatre/television/film (spades, tonk) Other Leisure Interests: Television Leisure Participation Style: With Family/Friends Awareness of Community Resources: Fair-identify 2 post discharge leisure resources Psychosocial / Spiritual Stress Management: Poor Methods of Stress Management: pt reports drinking to get away from stressors Patient agreeable to Pet Therapy: No Does patient have pets?: No Social interaction - Mood/Behavior: [place] Appropriate for Education?: Yes Recreational Therapy Orientation Orientation -Reviewed with patient: Available activity resources Strengths/Weaknesses Patient Strengths/Abilities: Willingness to participate Patient weaknesses: Physical limitations;Minimal Premorbid Leisure Activity TR Patient demonstrates impairments in the following area(s): Endurance;Motor;Perception;Safety  Plan Rec Therapy Plan Is patient appropriate for Therapeutic Recreation?: Yes Rehab Potential: Good Treatment times per week: Min 1 time per week >20 minutes Estimated Length of Stay: 10 days TR Treatment/Interventions: Adaptive equipment instruction;1:1 session;Balance/vestibular training;Functional  mobility training;Community reintegration;Leisure education;Patient/family education;Therapeutic activities;Recreation/leisure participation;Therapeutic exercise;Visual/perceptual remediation/compensation Recommendations for other services: Neuropsych  Recommendations for other services: None  Discharge Criteria: Patient will be discharged from TR if patient refuses treatment 3 consecutive times without medical reason.  If treatment goals not met, if there is a change in medical  status, if patient makes no progress towards goals or if patient is discharged from hospital.  The above assessment, treatment plan, treatment alternatives and goals were discussed and mutually agreed upon: by patient  [place] 05/14/2015, 10:22 AM

## 2015-05-14 NOTE — Progress Notes (Signed)
Social Work  Social Work Assessment and Plan  Patient Details  Name: Andre [name] MRN: [id number] Date of Birth: October 08, 1950  Today's Date: 05/14/2015  Problem List:  Patient Active Problem List   Diagnosis Date Noted  . Cerebral infarction due to thrombosis of right posterior cerebral artery ([place]) 05/11/2015  . Left hemiparesis ([place]) 05/11/2015  . Essential hypertension 05/09/2015  . Stroke ([place]) 05/09/2015  . Tobacco use disorder   . Cocaine abuse   . HLD (hyperlipidemia)   . ETOH abuse   . History of CVA with residual deficit   . Dysphagia as late effect of cerebrovascular disease   . Acute ischemic stroke ([place]) 05/08/2015  . Left-sided weakness   . Alcohol abuse   . Stroke, hemorrhagic ([place]) 03/31/2011  . Alcohol withdrawal ([place]) 03/31/2011  . Polysubstance abuse 03/31/2011   Past Medical History:  Past Medical History  Diagnosis Date  . Hypertension   . Stroke [facility])    Past Surgical History:  Past Surgical History  Procedure Laterality Date  . Abdominal surgery     Social History:  reports that he has been smoking.  He does not have any smokeless tobacco history on file. He reports that he drinks alcohol. His drug history is not on file.  Family / Support Systems Marital Status: Single Patient Roles: Partner Spouse/Significant Other: Andre [name]-girlfriend of 20 years Other Supports: Two brothers he see's often Anticipated Caregiver: Andre Ability/Limitations of Caregiver: Some health issues but independent Caregiver Availability: 24/7 Family Dynamics: Been with Andre for 20 years they depend upon one another. He has two brothers who he see's every so often, the rest of his family have passed away. He has friends who he hangs out with. He has no children  Social History Preferred language: English Religion:  Cultural Background: No issues Education: High School Read: Yes Write: Yes Employment Status: Disabled Freight forwarder Issues: No  issues Guardian/Conservator: None-according to MD pt is capable of making his own decisions while here   Abuse/Neglect Physical Abuse: Denies Verbal Abuse: Denies Sexual Abuse: Denies Exploitation of patient/patient's resources: Denies Self-Neglect: Denies  Emotional Status Pt's affect, behavior adn adjustment status: Pt is motivated to improve and feels if his vision was better he could manage, he has a hard time with his balance due to visual issues. He is wearing a patch to help with this. He has never had to experience this before, he has always taken care of himself. Recent Psychosocial Issues: Other health issues was managing going to the MD for his HTN Pyschiatric History: No history deferred depression screen due to doing well, wuld benefit form seeing neuor-psych while here for coping and substance abuse issues.  Substance Abuse History: Admits to ETOH and Tobacco but not to cocaine use. He reports he is aware he needs to quit drinking and plans to try when he is home, aware of resources but will discuss further during his stay  Patient / Family Perceptions, Expectations & Goals Pt/Family understanding of illness & functional limitations: Pt and Andre [name] are able to explain his stroke and deficits from it. His main issues is his visual and balance caused by this. He has spoken with MD and feels his questions are being answered. He hopes to do well here. Premorbid pt/family roles/activities: Boyfriend, friend, brother, etc Anticipated changes in roles/activities/participation: resume upon discharge Pt/family expectations/goals: Pt states: " I want to get my vision better so I can walk better."  Andre [name] states: " I hope he can walk  I can't help him physically."  [employer]: Other (Comment) (Had in past) Premorbid Home Care/DME Agencies: Other (Comment) (Had in past) Transportation available at discharge: Both use public transportation Resource referrals  recommended: Neuropsychology, Support group (specify)  Discharge Planning Living Arrangements: Spouse/significant other Support Systems: Spouse/significant other, Other relatives, Friends/neighbors Type of Residence: Private residence Insurance Resources: Medicaid (specify county) ([place]) Museum/gallery curator Resources: [place] Referred: No Living Expenses: Education officer, community Management: Significant Other, Patient Does the patient have any problems obtaining your medications?: No Home Management: Andre [name] does the cooking and Merchant navy officer Preliminary Plans: Return home with Andre [name] who is also disabled but manages and is still independent. They have been together for 20 years and can depend upon one another. Discussed applying for [employer] to assist wiht transportation needs and need to get another PCP due to MD has passed away. Social Work Anticipated Follow Up Needs: HH/OP, Support Group  Clinical Impression Pleasant cooperative gentleman who is motivated and wanting to improve from this stroke. He knows he needs to change some of his lifestyle habits to prevent another stroke. His partner-Andre is a strong support and will  Assist him at discharge. He would benefit from seeing neuro-psych while here, will make referral. Work on transportation issue and obtain a PCP prior to discharge from here.  Elease Hashimoto 05/14/2015, 10:01 AM

## 2015-05-14 NOTE — Progress Notes (Signed)
65 y.o. male with history of CVA with mild LLE weakness, polysubstance abuse who was admitted on 05/08/15 with left sided weakness and left facial droop. Patient had been out drinking with friends the night before and woke up the next day with worsening of left sided weakness. UDS positive for cocaine. CT head negative and CTA head/neck showed 60% stenosis proximal R-ICA and 50% stenosis L-ICA, moderate stenosis distal B-Va and mid basilar artery. MRI brain done revealing acute non-hemorrhagic infarct in right pons and medulla.   Subjective/Complaints: Patient denies any new complaints today Didn't eat breakfast because of therapy.  Review of systems denies any chest pain shortness breath nausea vomiting diarrhea or constipation  Objective: Vital Signs: Blood pressure 119/76, pulse 81, temperature 98 F (36.7 C), temperature source Oral, resp. rate 18, weight 76.023 kg (167 lb 9.6 oz), SpO2 99 %. No results found. Results for orders placed or performed during the hospital encounter of 05/11/15 (from the past 72 hour(s))  CBC WITH DIFFERENTIAL     Status: None   Collection Time: 05/12/15  5:45 AM  Result Value Ref Range   WBC 6.6 4.0 - 10.5 K/uL   RBC 4.73 4.22 - 5.81 MIL/uL   Hemoglobin 13.9 13.0 - 17.0 g/dL   HCT 43.4 39.0 - 52.0 %   MCV 91.8 78.0 - 100.0 fL   MCH 29.4 26.0 - 34.0 pg   MCHC 32.0 30.0 - 36.0 g/dL   RDW 12.2 11.5 - 15.5 %   Platelets 227 150 - 400 K/uL   Neutrophils Relative % 55 %   Neutro Abs 3.6 1.7 - 7.7 K/uL   Lymphocytes Relative 32 %   Lymphs Abs 2.1 0.7 - 4.0 K/uL   Monocytes Relative 11 %   Monocytes Absolute 0.7 0.1 - 1.0 K/uL   Eosinophils Relative 2 %   Eosinophils Absolute 0.1 0.0 - 0.7 K/uL   Basophils Relative 0 %   Basophils Absolute 0.0 0.0 - 0.1 K/uL  Comprehensive metabolic panel     Status: Abnormal   Collection Time: 05/12/15  5:45 AM  Result Value Ref Range   Sodium 140 135 - 145 mmol/L   Potassium 3.7 3.5 - 5.1 mmol/L   Chloride 105 101  - 111 mmol/L   CO2 25 22 - 32 mmol/L   Glucose, Bld 80 65 - 99 mg/dL   BUN 12 6 - 20 mg/dL   Creatinine, Ser 1.36 (H) 0.61 - 1.24 mg/dL   Calcium 9.3 8.9 - 10.3 mg/dL   Total Protein 6.7 6.5 - 8.1 g/dL   Albumin 3.4 (L) 3.5 - 5.0 g/dL   AST 17 15 - 41 U/L   ALT 14 (L) 17 - 63 U/L   Alkaline Phosphatase 49 38 - 126 U/L   Total Bilirubin 0.9 0.3 - 1.2 mg/dL   GFR calc non Af Amer 54 (L) >60 mL/min   GFR calc Af Amer >60 >60 mL/min    Comment: (NOTE) The eGFR has been calculated using the CKD EPI equation. This calculation has not been validated in all clinical situations. eGFR's persistently <60 mL/min signify possible Chronic Kidney Disease.    Anion gap 10 5 - 15     HEENT: eye patch Cardio: RRR and no murmur Resp: CTA B/L and unlabored GI: BS positive and NT, ND Extremity:  Pulses positive and No Edema Skin:   Intact Neuro: Alert/Oriented, Cranial Nerve Abnormalities dyscongugate gaze to Right, Normal Sensory, Abnormal Motor 4-/5 LLE 5/5 RUE, 4+ in BUE and  Abnormal FMC Ataxic/ dec FMC Musc/Skel:  Other no pain with UE or LE ROM Gen NAD   Assessment/Plan: 1. 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 Physiatrist is providing close team supervision and 24 hour management of active medical problems listed below. Physiatrist and rehab team continue to assess barriers to discharge/monitor patient progress toward functional and medical goals. FIM: Function - Bathing Bathing activity did not occur: Refused Position: Shower Body parts bathed by patient: Right arm, Left arm, Chest, Abdomen, Front perineal area, Right upper leg, Left upper leg Body parts bathed by helper: Buttocks, Left lower leg, Back, Right lower leg Assist Level: Touching or steadying assistance(Pt > 75%)  Function-  Upper Body Dressing/Undressing Upper body dressing/undressing activity did not occur: Refused What is the patient wearing?: Pull over shirt/dress Pull over shirt/dress - Perfomed by patient: Thread/unthread right sleeve, Thread/unthread left sleeve, Put head through opening, Pull shirt over trunk Assist Level: Touching or steadying assistance(Pt > 75%) Function - Lower Body Dressing/Undressing Lower body dressing/undressing activity did not occur: Refused What is the patient wearing?: Underwear, Non-skid slipper socks, Pants Position: Wheelchair/chair at sink Underwear - Performed by patient: Thread/unthread right underwear leg, Thread/unthread left underwear leg Underwear - Performed by helper: Pull underwear up/down Pants- Performed by patient: Thread/unthread right pants leg, Thread/unthread left pants leg, Pull pants up/down Pants- Performed by helper: Pull pants up/down Non-skid slipper socks- Performed by helper: Don/doff right sock, Don/doff left sock Assist for footwear: Partial/moderate assist Assist for lower body dressing: Touching or steadying assistance (Pt > 75%)  Function - Toileting Toileting activity did not occur: No continent bowel/bladder event Toileting steps completed by patient: Adjust clothing prior to toileting, Performs perineal hygiene Toileting steps completed by helper: Adjust clothing after toileting Toileting Assistive Devices: Grab bar or rail Assist level: Touching or steadying assistance (Pt.75%)  Function - Air cabin crew transfer assistive device: Mechanical lift Mechanical lift: Stedy (prior to eval)  Function - Chair/bed transfer Chair/bed transfer method: Stand pivot, Ambulatory Chair/bed transfer assist level: Touching or steadying assistance (Pt > 75%)  Function - Locomotion: Wheelchair Will patient use wheelchair at discharge?: Yes Type: Manual Max wheelchair distance: 150 Assist Level: Supervision or verbal cues Assist Level:  Supervision or verbal cues Assist Level: Supervision or verbal cues Function - Locomotion: Ambulation Assistive device: No device Max distance: 150 Assist level: Touching or steadying assistance (Pt > 75%) Assist level: Touching or steadying assistance (Pt > 75%) Assist level: Touching or steadying assistance (Pt > 75%) Assist level: Touching or steadying assistance (Pt > 75%) Assist level: Moderate assist (Pt 50 - 74%)  Function - Comprehension Comprehension: Auditory Comprehension assist level: Follows basic conversation/direction with no assist  Function - Expression Expression: Verbal Expression assist level: Expresses basic 90% of the time/requires cueing < 10% of the time.  Function - Social Interaction Social Interaction assist level: Interacts appropriately 90% of the time - Needs monitoring or encouragement for participation or interaction.  Function - Problem Solving Problem solving assist level: Solves basic 90% of the time/requires cueing < 10% of the time  Function - Memory Memory assist level: Recognizes or recalls 75 - 89% of the time/requires cueing 10 - 24% of the time Patient normally able to recall (first 3 days only): Current season, Location of own room, That he or she is in a hospital  Medical  Problem List and Plan: 1.  Left hemiparesis and cognitive/visual deficits secondary to right ponto-medullary infarct 2.  DVT Prophylaxis/Anticoagulation: Pharmaceutical: Lovenox- no clinical sign DVT 3. Pain Management: N/A 4. Mood: LCSW to follow for evaluatin and support.   5. Neuropsych: This patient is capable of making decisions on his own behalf. 6. Skin/Wound Care: routine pressure relief measures. Patient able to reposition himself in bed without difficulty.   7. Fluids/Electrolytes/Nutrition: Monitor I/O.creatinine borderline high otherwise normal 8. HTN: Monitor BP tid. Allow for higher blood pressure to allow adequate perfusion. Lisinopril and amlodipine on  hold at at this time.  blood pressure 119/76 this morning 9. Dyslipidemia: continue Zocor.   10. Polysubstance abuse: On CIWA protocol. No signs of withdrawal reported/noted.  11. URI: Will add nasal spray to help with congestion and schedule cough medication. Nebs prn.  symptoms are improving 12. Constipation: Will order Miralax today and schedule Senna S daily at bedtime.     LOS (Days) 3 A FACE TO FACE EVALUATION WAS PERFORMED  KIRSTEINS,ANDREW E 05/14/2015, 9:22 AM

## 2015-05-14 NOTE — Care Management Note (Signed)
[facility] Individual Statement of Services  Patient Name:  Andre [name]  Date:  05/14/2015  Welcome to the [facility].  Our goal is to provide you with an individualized program based on your diagnosis and situation, designed to meet your specific needs.  With this comprehensive rehabilitation program, you will be expected to participate in at least 3 hours of rehabilitation therapies Monday-Friday, with modified therapy programming on the weekends.  Your rehabilitation program will include the following services:  Physical Therapy (PT), Occupational Therapy (OT), Speech Therapy (ST), 24 hour per day rehabilitation nursing, Therapeutic Recreaction (TR), Neuropsychology, Case Management (Social Worker), Rehabilitation Medicine, Nutrition Services and Pharmacy Services  Weekly team conferences will be held on Wednesday to discuss your progress.  Your Social Worker will talk with you frequently to get your input and to update you on team discussions.  Team conferences with you and your family in attendance may also be held.  Expected length of stay: 12-14 days Overall anticipated outcome: mod/i-supervision level  Depending on your progress and recovery, your program may change. Your Social Worker will coordinate services and will keep you informed of any changes. Your Social Worker's name and contact numbers are listed  below.  The following services may also be recommended but are not provided by the [facility]:    [place] will be made to provide these services after discharge if needed.  Arrangements include referral to agencies that provide these services.  Your insurance has been verified to be:  Medicaid-[place] Access Your primary doctor is:  Dr. Harless Litten passed away-need new PCP  Pertinent information will be shared with your  doctor and your insurance company.  Social Worker:  Ovidio Kin, [place] or ([phone]  Information discussed with and copy given to patient by: Elease Hashimoto, 05/14/2015, 9:43 AM

## 2015-05-14 NOTE — Progress Notes (Signed)
Speech Language Pathology Daily Session Note  Patient Details  Name: Andre [name] MRN: [id number] Date of Birth: 1950-09-01  Today's Date: 05/14/2015 SLP Individual Time: [id number] SLP Individual Time Calculation (min): 40 min  Short Term Goals: Week 1: SLP Short Term Goal 1 (Week 1): Patient will tolerate trial trays of regular solids, thin liquids with no overt s/s of aspiration. SLP Short Term Goal 2 (Week 1): Patient will be able to utilize learned strategies to increase speech intelligibility at phrase level to 90% accuracy for two consecutive sessions. SLP Short Term Goal 3 (Week 1): Patient will demonstrate ability to complete complex-level medication management task with 90% accuracy prior to discharge. SLP Short Term Goal 4 (Week 1): Patient will be able to increase and maintain adequate vocal intensity at phrase level, following clinician education and training for strategies (respiratory support/control, etc).  Skilled Therapeutic Interventions:  Pt was seen for skilled ST targeting goals for communication and dysphagia.  SLP facilitated the session with a trial snack of regular textures to continue working towards diet progression.  Pt demonstrated slow but effective mastication of advanced solids with complete clearance of solids from the oral cavity with mod I use of swallowing precautions.  No overt s/s of aspiration were evident with solids or liquids.  Pt edentulous but reports tolerating a regular diet prior to admission without dentures.  As a result, recommend advancing pt to a regular diet with thin liquids.  Pt was noted with slightly decreased vocal intensity during functional conversations with SLP but was fully intelligible in a minimally distracting environment.  Pt was able to maintain an increased vocal intensity during sustained phonation and counting tasks with mod I.  Pt was also able to yell to simulate calling out for help and was heard outside of his room, in the  hallway with the door closed with mod I.  Pt reports speech to be at baseline and has no concerns for intelligibility.  Pt also reports cognition to be baseline and demonstrated appropriate safety awareness by ensuring that his call bell was within reach prior to SLP's departure.  Pt also recalled details from SLP evaluation >48 hours prior to treatment session with supervision question cues.  Given that pt is at baseline for speech and cognition, SLP recommends 1-2 additional treatments to assess toleration of diet upgrade then sign off for ST services.  Pt left in recliner with call bell within reach.  Continue per current plan of care.     Function:  Eating Eating   Modified Consistency Diet: No Eating Assist Level: More than reasonable amount of time           Cognition Comprehension Comprehension assist level: Follows basic conversation/direction with no assist  Expression   Expression assist level: Expresses basic 90% of the time/requires cueing < 10% of the time.  Social Interaction Social Interaction assist level: Interacts appropriately 90% of the time - Needs monitoring or encouragement for participation or interaction.  Problem Solving Problem solving assist level: Solves basic 90% of the time/requires cueing < 10% of the time  Memory Memory assist level: Recognizes or recalls 75 - 89% of the time/requires cueing 10 - 24% of the time    Pain Pain Assessment Pain Assessment: No/denies pain Pain Score: 0-No pain  Therapy/Group: Individual Therapy  Ema Hebner, Selinda Orion 05/14/2015, 4:12 PM

## 2015-05-14 NOTE — IPOC Note (Addendum)
Overall Plan of Care [facility]) Patient Details Name: Andre [name] MRN: [id number] DOB: [birth year]  Admitting Diagnosis: RT CVA  Hospital Problems: Principal Problem:   Cerebral infarction due to thrombosis of right posterior cerebral artery ([place]) Active Problems:   Essential hypertension   Left hemiparesis ([place])     Functional Problem List: Nursing Bowel, Endurance, Medication Management, Motor, Perception, Safety, Sensory  PT Balance, Endurance, Motor, Safety  OT Balance, Endurance, Motor, Perception, Safety, Vision  SLP    TR Endurance, Motor, Perception, Safety       Basic ADL's: OT Grooming, Bathing, Dressing, Toileting     Advanced  ADL's: OT       Transfers: PT Bed Mobility, Bed to Chair, Teacher, early years/pre, Tub/Shower     Locomotion: PT Ambulation, Emergency planning/management officer, Stairs     Additional Impairments: OT Fuctional Use of Upper Extremity  SLP Swallowing      TR  community integration, stress managment    Anticipated Outcomes Item Anticipated Outcome  Self Feeding independent  Swallowing  modified independent with regular solids, thin liquids   Basic self-care  supervision  Toileting  supervision   Bathroom Transfers supervision  Bowel/Bladder  manage bowel and bladder min assist  Transfers  mod I for transfers  Locomotion  mod I gait, mod I w/c mobility, S for stairs  Communication  modified independent for sentence-level intelligibility of speech  Cognition  modified independent   Pain  2 or less  Safety/Judgment  Minimal assist   Therapy Plan: PT Intensity: Minimum of 1-2 x/day ,45 to 90 minutes PT Frequency: 5 out of 7 days PT Duration Estimated Length of Stay: 12 to 14 days OT Intensity: Minimum of 1-2 x/day, 45 to 90 minutes OT Frequency: 5 out of 7 days OT Duration/Estimated Length of Stay: 12-14 days SLP Intensity: Minumum of 1-2 x/day, 30 to 90 minutes SLP Frequency: 3 to 5 out of 7 days SLP Duration/Estimated Length of  Stay: 10-12 days  TR Duration/ELOS:  10 Days TR Frequency:  Min 1 time per week >20 minutes        Team Interventions: Nursing Interventions Patient/Family Education, Bowel Management, Disease Management/Prevention, Medication Management  PT interventions Ambulation/gait training, Training and development officer, Functional mobility training, DME/adaptive equipment instruction, Community reintegration, Neuromuscular re-education, Patient/family education, Splinting/orthotics, Therapeutic Exercise, Visual/perceptual remediation/compensation, UE/LE Coordination activities, Therapeutic Activities, Stair training, UE/LE Strength taining/ROM, Wheelchair propulsion/positioning  OT Interventions Training and development officer, Discharge planning, DME/adaptive equipment instruction, Functional mobility training, Neuromuscular re-education, Patient/family education, Self Care/advanced ADL retraining, Therapeutic Activities, Therapeutic Exercise, UE/LE Strength taining/ROM, UE/LE Coordination activities, Visual/perceptual remediation/compensation  SLP Interventions Speech/Language facilitation, Cueing hierarchy, Therapeutic Activities, Functional tasks, Therapeutic Exercise, Dysphagia/aspiration precaution training, Medication managment, Patient/family education  TR Interventions Adaptive equipment instruction, 1:1 session, Training and development officer, Functional mobility training, Academic librarian, Leisure education, Barrister's clerk education, Therapeutic activities, Recreation/leisure participation, Therapeutic exercise, Visual/perceptual remediation/compensation; psychosocial support  SW/CM Interventions Discharge Planning, Barrister's clerk, Patient/Family Education    Team Discharge Planning: Destination: PT-Home ,OT- Home , SLP-Home Projected Follow-up: PT-Home health PT, OT-  Home health OT, SLP-None Projected Equipment Needs: PT-To be determined, OT- To be determined, SLP-None recommended by  SLP Equipment Details: PT- , OT-  Patient/family involved in discharge planning: PT- Patient,  OT-Patient, SLP-Patient  MD ELOS: 10-12d Medical Rehab Prognosis:  Good Assessment: 65 y.o. male with history of CVA with mild LLE weakness, polysubstance abuse who was admitted on 05/08/15 with left sided weakness and left facial droop. Patient had been out drinking with friends the night  before and woke up the next day with worsening of left sided weakness. UDS positive for cocaine. CT head negative and CTA head/neck showed 60% stenosis proximal R-ICA and 50% stenosis L-ICA, moderate stenosis distal B-Va and mid basilar artery. MRI brain done revealing acute non-hemorrhagic infarct in right pons and medulla.    Now requiring 24/7 Rehab RN,MD, as well as CIR level PT, OT and SLP.  Treatment team will focus on ADLs and mobility with goals set at Mod I  See Team Conference Notes for weekly updates to the plan of care

## 2015-05-14 NOTE — Progress Notes (Signed)
Patient information reviewed and entered into eRehab system by Alydia Gosser, RN, CRRN, PPS Coordinator.  Information including medical coding and functional independence measure will be reviewed and updated through discharge.    

## 2015-05-15 ENCOUNTER — Inpatient Hospital Stay (HOSPITAL_COMMUNITY): Payer: Medicare Other | Admitting: Physical Therapy

## 2015-05-15 ENCOUNTER — Inpatient Hospital Stay (HOSPITAL_COMMUNITY): Payer: Medicare Other | Admitting: Speech Pathology

## 2015-05-15 ENCOUNTER — Inpatient Hospital Stay (HOSPITAL_COMMUNITY): Payer: Medicare Other | Admitting: Occupational Therapy

## 2015-05-15 NOTE — Progress Notes (Signed)
Recreational Therapy Session Note  Patient Details  Name: Andre [name] MRN: [id number] Date of Birth: 12/07/1950 Today's Date: 05/15/2015  Pain: no c/o Skilled Therapeutic Interventions/Progress Updates: Session focused on activity tolerance, dynamic standing balance, ambulation, visual scanning/compensation during co-treat with PT.  Pt ambulated forward & backwards with min assist while scanning to the right to locate horseshoes during ambulation.  Pt required multiple attempts to locate all the horseshoes.  Therapy/Group: Co-Treatment   Juanitta Earnhardt 05/15/2015, 3:14 PM

## 2015-05-15 NOTE — Progress Notes (Signed)
Occupational Therapy Session Note  Patient Details  Name: Andre [name] MRN: [id number] Date of Birth: [birth year]  Today's Date: 05/15/2015 OT Individual Time: 0935-1100 OT Individual Time Calculation (min): 85 min    Short Term Goals: Week 1:  OT Short Term Goal 1 (Week 1): Pt will maintain head in midline for 5 minutes duting activity OT Short Term Goal 2 (Week 1): Pt. will groom self with min assist OT Short Term Goal 3 (Week 1): Pt will bathe welf with min assit OT Short Term Goal 4 (Week 1): Pt. will dress self with min assist OT Short Term Goal 5 (Week 1): Pt will transfer to toilet with min assist  Skilled Therapeutic Interventions/Progress Updates:    Treatment session with focus on functional mobility, standing balance, and treatment of visual impairments.  Pt deferred bathing and dressing, reporting SO to bring in clothes later today.  Ambulated to therapy gym without eye patch with supervision.  Engaged in table top task in sitting progressing to standing with focus on use of LUE and convergence with pyramid stacking task, pt with increased difficulty in standing when using LUE compared to RUE due to mild Lt ataxia and overshooting.  Reaching task in sitting and standing with focus on motor control, visual convergence and divergence during reaching and placement of items.  Utilized "card board" in standing with focus on visual scanning to Rt with pt requiring head turns due to decreased ability to scan to Rt.  Pt required increased time with scanning task to locate playing cards in order.  Increased challenge to standing on compliant surface to further challenge standing balance with trunk control and reaching.  Pt able to complete all tasks with increased time and supervision.  Pt demonstrating increased awareness of visual deficits and impairments and reports decrease in double vision this session.  Therapy Documentation Precautions:  Precautions Precautions:  Fall Restrictions Weight Bearing Restrictions: No Pain: Pain Assessment Pain Assessment: No/denies pain Pain Score: 0-No pain  See Function Navigator for Current Functional Status.   Therapy/Group: Individual Therapy  Simonne Come 05/15/2015, 11:43 AM

## 2015-05-15 NOTE — Progress Notes (Signed)
Speech Language Pathology Discharge Summary  Patient Details  Name: Andre [name] MRN: [id number] Date of Birth: 1951-03-21  Today's Date: 05/15/2015 SLP Individual Time: 1135-1230 SLP Individual Time Calculation (min): 55 min   Skilled Therapeutic Interventions:  Pt was seen for skilled ST targeting dysphagia goals.  Pt consumed regular textures and thin liquids with mod I use of universal swallowing precautions and demonstrated no overt s/s of aspiration with solids or liquids.  While pt's mastication of solids was slowed due to edentulous state, pt was able to clear solids from the oral cavity post swallow with no cues needed and no complaints of fatigue.  Pt with intermittently wet vocal quality both with and without PO intake which SLP suspects to be related to management of increased secretions.  Pt reports no recent history of dysphagia outside of previous CVA 5 years ago.  Given that pt appears to be at baseline for swallowing function, do not recommend further ST services.  All education is complete at this time.  Pt left in recliner with call bell left within reach.      Patient has met 3 of 3 long term goals.  Patient to discharge at overall Modified Independent level.  Reasons goals not met:  n/a    Clinical Impression/Discharge Summary:  Pt made functional gains and is discharging from [place] services having met 3 out of 3 long term goals.  Pt's diet has been advanced to regular textures and thin liquids.  He is consuming his currently prescribed diet with mod I use of standard swallowing precautions with minimal overt s/s of aspiration at bedside.  Pt is at baseline for speech intelligibility and cognition.  As a result, no further ST services are indicated at this time.  All education is complete.   Care Partner:  Caregiver Able to Provide Assistance: Other (comment) (n/a)     Recommendation:  None      Equipment: none recommended by SLP    Reasons for discharge: Treatment  goals met   Patient/Family Agrees with Progress Made and Goals Achieved: Yes   Function:  Eating Eating   Modified Consistency Diet: No Eating Assist Level: More than reasonable amount of time           Cognition Comprehension Comprehension assist level: Follows basic conversation/direction with no assist  Expression   Expression assist level: Expresses basic needs/ideas: With no assist  Social Interaction Social Interaction assist level: Interacts appropriately 90% of the time - Needs monitoring or encouragement for participation or interaction.  Problem Solving Problem solving assist level: Solves basic 90% of the time/requires cueing < 10% of the time  Memory Memory assist level: Recognizes or recalls 75 - 89% of the time/requires cueing 10 - 24% of the time   Emilio Math 05/15/2015, 12:35 PM

## 2015-05-15 NOTE — Progress Notes (Signed)
65 y.o. male with history of CVA with mild LLE weakness, polysubstance abuse who was admitted on 05/08/15 with left sided weakness and left facial droop. Patient had been out drinking with friends the night before and woke up the next day with worsening of left sided weakness. UDS positive for cocaine. CT head negative and CTA head/neck showed 60% stenosis proximal R-ICA and 50% stenosis L-ICA, moderate stenosis distal B-Va and mid basilar artery. MRI brain done revealing acute non-hemorrhagic infarct in right pons and medulla.   Subjective/Complaints: Ambulating with physical therapy this morning. Denies any pains during ambulation. Using a left eye patch. Patch reportedly helped some with double vision.  Review of systems denies any chest pain shortness breath nausea vomiting diarrhea or constipation  Objective: Vital Signs: Blood pressure 124/76, pulse 72, temperature 98.5 F (36.9 C), temperature source Oral, resp. rate 18, height 5\' 6"  (1.676 m), weight 76.023 kg (167 lb 9.6 oz), SpO2 99 %. No results found. No results found for this or any previous visit (from the past 72 hour(s)).   HEENT: eye patch Cardio: RRR and no murmur Resp: CTA B/L and unlabored GI: BS positive and NT, ND Extremity:  Pulses positive and No Edema Skin:   Intact Neuro: Alert/Oriented, Cranial Nerve Abnormalities dyscongugate gaze to Right, Normal Sensory, Abnormal Motor 4-/5 LLE 5/5 RUE, 4+ in BUE and Abnormal FMC Ataxic/ dec FMC Musc/Skel:  Other no pain with UE or LE ROM Gen NAD   Assessment/Plan: 1. 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 Physiatrist is providing close team supervision and 24 hour management of active medical problems listed below. Physiatrist and rehab team continue to assess barriers to  discharge/monitor patient progress toward functional and medical goals. FIM: Function - Bathing Bathing activity did not occur: Refused Position: Shower Body parts bathed by patient: Right arm, Left arm, Chest, Abdomen, Front perineal area, Right upper leg, Left upper leg Body parts bathed by helper: Buttocks, Left lower leg, Back, Right lower leg Assist Level: Touching or steadying assistance(Pt > 75%)  Function- Upper Body Dressing/Undressing Upper body dressing/undressing activity did not occur: Refused What is the patient wearing?: Pull over shirt/dress Pull over shirt/dress - Perfomed by patient: Thread/unthread right sleeve, Thread/unthread left sleeve, Put head through opening, Pull shirt over trunk Assist Level: Touching or steadying assistance(Pt > 75%) Function - Lower Body Dressing/Undressing Lower body dressing/undressing activity did not occur: Refused What is the patient wearing?: Underwear, Non-skid slipper socks, Pants Position: Wheelchair/chair at sink Underwear - Performed by patient: Thread/unthread right underwear leg, Thread/unthread left underwear leg Underwear - Performed by helper: Pull underwear up/down Pants- Performed by patient: Thread/unthread right pants leg, Thread/unthread left pants leg, Pull pants up/down Pants- Performed by helper: Pull pants up/down Non-skid slipper socks- Performed by helper: Don/doff right sock, Don/doff left sock Assist for footwear: Partial/moderate assist Assist for lower body dressing: Touching or steadying assistance (Pt > 75%)  Function - Toileting Toileting activity did not occur: No continent bowel/bladder event Toileting steps completed by patient: Adjust clothing prior to toileting, Performs perineal hygiene Toileting steps completed by helper: Adjust clothing after toileting Toileting Assistive Devices: Grab bar or rail Assist level: Touching or steadying assistance (Pt.75%)  Function - Air cabin crew  transfer assistive device: Mechanical lift Mechanical lift: Stedy (prior to eval)  Function - Chair/bed transfer Chair/bed transfer method: Stand  pivot, Ambulatory Chair/bed transfer assist level: Touching or steadying assistance (Pt > 75%)  Function - Locomotion: Wheelchair Will patient use wheelchair at discharge?: No Type: Manual Max wheelchair distance: 150 Assist Level: Supervision or verbal cues Assist Level: Supervision or verbal cues Assist Level: Supervision or verbal cues Function - Locomotion: Ambulation Assistive device: No device Max distance: 150 Assist level: Touching or steadying assistance (Pt > 75%) Assist level: Touching or steadying assistance (Pt > 75%) Assist level: Touching or steadying assistance (Pt > 75%) Assist level: Touching or steadying assistance (Pt > 75%) Assist level: Moderate assist (Pt 50 - 74%)  Function - Comprehension Comprehension: Auditory Comprehension assist level: Follows basic conversation/direction with no assist  Function - Expression Expression: Verbal Expression assist level: Expresses basic needs/ideas: With no assist  Function - Social Interaction Social Interaction assist level: Interacts appropriately 90% of the time - Needs monitoring or encouragement for participation or interaction.  Function - Problem Solving Problem solving assist level: Solves basic 90% of the time/requires cueing < 10% of the time  Function - Memory Memory assist level: Recognizes or recalls 75 - 89% of the time/requires cueing 10 - 24% of the time Patient normally able to recall (first 3 days only): Current season, Location of own room, That he or she is in a hospital  Medical Problem List and Plan: 1.  Left hemiparesis and cognitive/visual deficits secondary to right ponto-medullary infarct, the patient has decreased conjugate gaze, paramedian pontine reticular formation as well as right cranial nerve VI involvement due to his stroke 2.  DVT  Prophylaxis/Anticoagulation: Pharmaceutical: Lovenox- no clinical sign DVT 3. Pain Management: N/A 4. Mood: LCSW to follow for evaluatin and support.   5. Neuropsych: This patient is capable of making decisions on his own behalf. 6. Skin/Wound Care: routine pressure relief measures. Patient able to reposition himself in bed without difficulty.   7. Fluids/Electrolytes/Nutrition: Monitor I/O.creatinine borderline high otherwise normal 8. HTN: Monitor BP tid. Allow for higher blood pressure to allow adequate perfusion. Lisinopril and amlodipine on hold at at this time.  blood pressure 124/76 this morning 9. Dyslipidemia: continue Zocor.   10. Polysubstance abuse: On CIWA protocol. No signs of withdrawal reported/noted.  11. URI: resolved 12. Constipation: Will order Miralax today and schedule Senna S daily at bedtime.  formed bowel movement 05/14/2015, continent   LOS (Days) 4 A FACE TO FACE EVALUATION WAS PERFORMED  Lilee Aldea E 05/15/2015, 8:26 AM

## 2015-05-15 NOTE — Progress Notes (Signed)
Physical Therapy Session Note  Patient Details  Name: Andre [name] MRN: [id number] Date of Birth: 03-23-1951  Today's Date: 05/15/2015 PT Individual Time: 0800-0900 and 1500-1530 PT Individual Time Calculation (min): 60 min and 30 min make-up session for 05/13/14 (total 90 min)   Short Term Goals: Week 1:  PT Short Term Goal 1 (Week 1): Pt will increase bed mobility to mod I.  PT Short Term Goal 2 (Week 1): Pt will increase transfers to min A.  PT Short Term Goal 3 (Week 1): Pt will increase ambulation with LRAD to min A about 150 feet.  PT Short Term Goal 4 (Week 1): Pt will ascend/descend 4 stairs with B rails and min A.  PT Short Term Goal 5 (Week 1): Pt will propel w/c about 150 feet with mod I.   Skilled Therapeutic Interventions/Progress Updates:    Tx 1: Pt received seated in bed finishing breakfast, no c/o pain and agreeable to treatment. Gait into bathroom with minA and pt reaching for furniture/walls for stability. Standing balance with LUE on rail and min guard while urinating. Gait to gym with variable min guard >modA for balance with noted LLE buckling and hyperextending, staggering to L side and min verbal cues for navigation due to R inattention. Standing balance with alternating LE toe taps to numbered targets on 6" step. Forward and backward gait in hallway with min guard>minA with horseshoes hung on R side for R attention and visual scanning. Requires several attempts to locate all horseshoes. Standing balance with RUE on 3" step to facilitate LLE weight bearing and stance control; cues to prevent hyperextension and min guard for balance. Gait to return to room with min guard. Remained supine in bed with alarm intact and all needs in reach at completion of session. Discussed possibility of switching rooms with RN, charge nurse to allow pt to attend to R side throughout the day.   Tx 2: Pt received supine in bed, no c/o pain and agreeable to treatment. Ambulated into bathroom  with close S, standing at toilet to urinate with grab bar for balance and close S. Gait to gym x175' with variable S>minA due to occasional staggering to L side. Nustep x10 min with BUE/BLE for strengthening and aerobic endurance. Stair training 2 x 16 6-inch stairs with 2 handrails and CGA faded to S with additional trial. Gait to return to room x175' with close S. Remained supine in bed with all needs in reach at completion of session.    Therapy Documentation Precautions:  Precautions Precautions: Fall Restrictions Weight Bearing Restrictions: No Pain: Pain Assessment Pain Assessment: No/denies pain Pain Score: 0-No pain   See Function Navigator for Current Functional Status.   Therapy/Group: Individual Therapy  Luberta Mutter 05/15/2015, 3:33 PM

## 2015-05-16 ENCOUNTER — Inpatient Hospital Stay (HOSPITAL_COMMUNITY): Payer: Medicaid Other | Admitting: Physical Therapy

## 2015-05-16 ENCOUNTER — Inpatient Hospital Stay (HOSPITAL_COMMUNITY): Payer: Medicare Other

## 2015-05-16 ENCOUNTER — Inpatient Hospital Stay (HOSPITAL_COMMUNITY): Payer: Medicaid Other | Admitting: *Deleted

## 2015-05-16 ENCOUNTER — Inpatient Hospital Stay (HOSPITAL_COMMUNITY): Payer: Medicare Other | Admitting: Occupational Therapy

## 2015-05-16 DIAGNOSIS — I69398 Other sequelae of cerebral infarction: Secondary | ICD-10-CM

## 2015-05-16 DIAGNOSIS — H539 Unspecified visual disturbance: Secondary | ICD-10-CM

## 2015-05-16 NOTE — Patient Care Conference (Signed)
Inpatient RehabilitationTeam Conference and Plan of Care Update Date: 05/16/2015   Time: 11:15 AM   Patient Name: Andre [name]      Medical Record Number: [id number]  Date of Birth: 1951-04-02 Sex: Male         Room/Bed: 4W01C/4W01C-01 Payor Info: Payor: MEDICAID [place] / Plan: MEDICAID [place] ACCESS / Product Type: *No Product type* /    Admitting Diagnosis: RT CVA  Admit Date/Time:  05/11/2015  4:20 PM Admission Comments: No comment available   Primary Diagnosis:  Cerebral infarction due to thrombosis of right posterior cerebral artery ([place]) Principal Problem: Cerebral infarction due to thrombosis of right posterior cerebral artery [facility])  Patient Active Problem List   Diagnosis Date Noted  . Visual disturbance as complication of stroke 27/25/3664  . Cerebral infarction due to thrombosis of right posterior cerebral artery ([place]) 05/11/2015  . Left hemiparesis ([place]) 05/11/2015  . Essential hypertension 05/09/2015  . Stroke ([place]) 05/09/2015  . Tobacco use disorder   . Cocaine abuse   . HLD (hyperlipidemia)   . ETOH abuse   . History of CVA with residual deficit   . Dysphagia as late effect of cerebrovascular disease   . Acute ischemic stroke ([place]) 05/08/2015  . Left-sided weakness   . Alcohol abuse   . Stroke, hemorrhagic ([place]) 03/31/2011  . Alcohol withdrawal ([place]) 03/31/2011  . Polysubstance abuse 03/31/2011    Expected Discharge Date: Expected Discharge Date: 05/22/15  Team Members Present: Physician leading conference: Dr. Alysia Penna Social Worker Present: Ovidio Kin, LCSW Nurse Present: Dorien Chihuahua, RN PT Present: Kem Parkinson, PT OT Present: Willeen Cass, OT SLP Present: Windell Moulding, SLP PPS Coordinator present : Daiva Nakayama, RN, CRRN     Current Status/Progress Goal Weekly Team Focus  Medical   Still having constipation, urinary frequency  Continent and regular with bowel and bladder  Medication adjustment, toileting program    Bowel/Bladder   Pt continent of bowel and bladder  Min assist  cont. POC with bowel and bladder   Swallow/Nutrition/ Hydration     na        ADL's   supervision transfers and dynamic standing balance, min assist bathing and UB dressing, mod assist LB dressing  supervision overall  vision, functional mobility, ADL retraining, pt/family education   Mobility   S bed mobility, close S transfers, min guard gait and stairs  Mod I transfers, bed mobility. Downgraded home ambulation to S due to visual/perceptual impairments, added community gait S, d/c w/c goals  dynamic standing balance, LLE NMR, visual/perceptual deficits   Communication     na        Safety/Cognition/ Behavioral Observations    no unsafe behaviors        Pain   denies pain         Skin   skin CDI  free of skin breakdown independently  assess skin q shift      *See Care Plan and progress notes for long and short-term goals.  Barriers to Discharge: Continues require  physical assistance,, see above    Possible Resolutions to Barriers:  Continue rehabilitation program    Discharge Planning/Teaching Needs:    Home with girlfriend who can provide supervision level. Friends will be in and out     Team Discussion:  Goals-mod/i level, supervision ADL's. Eye patch helping. Back to baseline with cognition and regular diet. DC-SP Constipation improving. Will need new PCP due to pt's has recently passed away  Revisions to Treatment Plan:  DC-SP goals met   Continued Need for Acute Rehabilitation Level of Care: The patient requires daily medical management by a physician with specialized training in physical medicine and rehabilitation for the following conditions: Daily direction of a multidisciplinary physical rehabilitation program to ensure safe treatment while eliciting the highest outcome that is of practical value to the patient.: Yes Daily medical management of patient stability for increased activity during  participation in an intensive rehabilitation regime.: Yes Daily analysis of laboratory values and/or radiology reports with any subsequent need for medication adjustment of medical intervention for : Neurological problems;Other;Urological problems  Andie Mungin, Gardiner Rhyme 05/16/2015, 1:30 PM

## 2015-05-16 NOTE — Progress Notes (Signed)
Physical Therapy Session Note  Patient Details  Name: Andre [name] MRN: [id number] Date of Birth: 09-20-1950  Today's Date: 05/16/2015 PT Individual Time: 1000-1030 PT Individual Time Calculation (min): 30 min  and  Today's Date: 05/16/2015 PT Concurrent Time: 0900-1000 PT Concurrent Time Calculation (min): 60 min  Short Term Goals: Week 1:  PT Short Term Goal 1 (Week 1): Pt will increase bed mobility to mod I.  PT Short Term Goal 2 (Week 1): Pt will increase transfers to min A.  PT Short Term Goal 3 (Week 1): Pt will increase ambulation with LRAD to min A about 150 feet.  PT Short Term Goal 4 (Week 1): Pt will ascend/descend 4 stairs with B rails and min A.  PT Short Term Goal 5 (Week 1): Pt will propel w/c about 150 feet with mod I.   Skilled Therapeutic Interventions/Progress Updates:    Pt received ambulating in hallway with recreational therapist for transport to gym, S for dynamic balance. No c/o pain and agreeable to treatment. Nustep x10 min level 5 with BUE/BLE for strengthening and endurance. Seated and standing LE exercises as part of [place] A exercises, 2 sets 15 reps of each. Dynamic gait in gym with visual scanning to locate numbered targets around gym at various heights to facilitate visual scanning and cognitive dual task with gait; S overall for balance. Standing static and dynamic balance with zoomball, including forward and backwards walk while using ball. Close S for backwards walking and cues for larger steps. Stairs 2 x 16 on 6-inch stairs with B handrails for strengthening and endurance. Couch, bed, and car transfer all performed with S. Educated pt and girlfriend in floor transfer; performed with S and min verbal cues for sequencing and safety. Community gait with S in hospital gift shop for increased cognitive dual task, navigation challenges. Girlfriend present for entire session to observe, states she's "crippled" from a fall and a stroke, but able to provide verbal  cueing for safety. Pt returned to room and remained supine in bed with all needs in reach at completion of session.   Therapy Documentation Precautions:  Precautions Precautions: Fall Restrictions Weight Bearing Restrictions: No Pain: Pain Assessment Pain Assessment: No/denies pain Pain Score: 0-No pain   See Function Navigator for Current Functional Status.   Therapy/Group: Individual Therapy  Luberta Mutter 05/16/2015, 10:45 AM

## 2015-05-16 NOTE — Plan of Care (Signed)
Problem: RH Ambulation Goal: LTG Patient will ambulate in home environment (PT) LTG: Patient will ambulate in home environment, # of feet with assistance (PT).  Downgraded d/t visual/perceptual impairments     Problem: RH Wheelchair Mobility Goal: LTG Patient will propel w/c in controlled environment (PT) LTG: Patient will propel wheelchair in controlled environment, # of feet with assist (PT)  Outcome: Not Applicable Date Met:  94/70/96 D/c; pt ambulatory    Goal: LTG Patient will propel w/c in home environment (PT) LTG: Patient will propel wheelchair in home environment, # of feet with assistance (PT).  Outcome: Not Applicable Date Met:  28/36/62 D/c; pt ambulatory Goal: LTG Patient will propel w/c in community environment (PT) LTG: Patient will propel wheelchair in community environment, # of feet with assist (PT)  Outcome: Not Applicable Date Met:  94/76/54 D/c; pt ambulatory

## 2015-05-16 NOTE — Progress Notes (Signed)
Occupational Therapy Session Note  Patient Details  Name: Andre [name] MRN: [id number] Date of Birth: 1951-01-07  Today's Date: 05/16/2015 OT Individual Time: 1300-1345 OT Individual Time Calculation (min): 45 min    Short Term Goals: Week 1:  OT Short Term Goal 1 (Week 1): Pt will maintain head in midline for 5 minutes duting activity OT Short Term Goal 2 (Week 1): Pt. will groom self with min assist OT Short Term Goal 3 (Week 1): Pt will bathe welf with min assit OT Short Term Goal 4 (Week 1): Pt. will dress self with min assist OT Short Term Goal 5 (Week 1): Pt will transfer to toilet with min assist      Skilled Therapeutic Interventions/Progress Updates:    Pt seen for skilled OT for therapeutic activities to increase dynamic balance, head control in midline and visual scanning.  Pt received in laying in bed and sat up to EOB to participate in visual scanning of finding colors of objects and larger numbers on tiles. Pt sorted numbers from 1-12 using good techniques of shifting head to compensate for decreased eye ROM.  Standing balance exercises of alt toe taps, alt side lunges, marching, standing and reaching overhead, and twisting.  Visual fixation exercise to turn head while maintaining visual focus. Good control with head turns to R, difficulty to turn to L.  Discussed using those vision exercises on his own time. Pt ambulated to toilet and toileted with S.  Pt returned to bed with alarm set.     Therapy Documentation Precautions:  Precautions Precautions: Fall Restrictions Weight Bearing Restrictions: No   Pain: Pain Assessment Pain Assessment: No/denies pain Pain Score: 0-No pain ADL:   See Function Navigator for Current Functional Status.   Therapy/Group: Individual Therapy  Katrisha Segall 05/16/2015, 1:02 PM

## 2015-05-16 NOTE — Plan of Care (Signed)
Problem: RH Balance Goal: LTG Patient will maintain dynamic standing balance (PT) LTG: Patient will maintain dynamic standing balance with assistance during mobility activities (PT) Added due to progress  Problem: RH Ambulation Goal: LTG Patient will ambulate in community environment (PT) LTG: Patient will ambulate in community environment, # of feet with assistance (PT). Goal added due to progress

## 2015-05-16 NOTE — Progress Notes (Signed)
Social Work Patient ID: Andre [name], male   DOB: [birth year], 65 y.o.   MRN: [id number] Met with pt and girlfriend to inform team conference goals-mod/i-supervision level and discharge 1/17.  Both feel he is doing well and will be ready then. He does need to get a new PCP since his passed away Last week.  He prefer OP clinic or [place].  Will work on discharge needs, asked girlfriend to go through therapies while she is here today so she can see how he is doing. She reports she will.

## 2015-05-16 NOTE — Progress Notes (Signed)
65 y.o. male with history of CVA with mild LLE weakness, polysubstance abuse who was admitted on 05/08/15 with left sided weakness and left facial droop. Patient had been out drinking with friends the night before and woke up the next day with worsening of left sided weakness. UDS positive for cocaine. CT head negative and CTA head/neck showed 60% stenosis proximal R-ICA and 50% stenosis L-ICA, moderate stenosis distal B-Va and mid basilar artery. MRI brain done revealing acute non-hemorrhagic infarct in right pons and medulla.   Subjective/Complaints: Ambulating with physical therapy this morning. Denies any pains during ambulation. Using a left eye patch. Patch reportedly helped some with double vision.  Review of systems denies any chest pain shortness breath nausea vomiting diarrhea or constipation  Objective: Vital Signs: Blood pressure 100/67, pulse 79, temperature 98 F (36.7 C), temperature source Oral, resp. rate 18, height 5' 6"  (1.676 m), weight 76.023 kg (167 lb 9.6 oz), SpO2 100 %. No results found. No results found for this or any previous visit (from the past 72 hour(s)).   HEENT: eye patch Cardio: RRR and no murmur Resp: CTA B/L and unlabored GI: BS positive and NT, ND Extremity:  Pulses positive and No Edema Skin:   Intact Neuro: Alert/Oriented, Cranial Nerve Abnormalities dyscongugate gaze to Right, Normal Sensory, Abnormal Motor 4-/5 LLE 5/5 RUE, 4+ in BUE and Abnormal FMC Ataxic/ dec FMC Musc/Skel:  Other no pain with UE or LE ROM Gen NAD   Assessment/Plan: 1. 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 Physiatrist is providing close team supervision and 24 hour management of active medical problems listed below. Physiatrist and rehab team continue to assess barriers to  discharge/monitor patient progress toward functional and medical goals. FIM: Function - Bathing Bathing activity did not occur: Refused Position: Shower Body parts bathed by patient: Right arm, Left arm, Chest, Abdomen, Front perineal area, Right upper leg, Left upper leg Body parts bathed by helper: Buttocks, Left lower leg, Back, Right lower leg Assist Level: Touching or steadying assistance(Pt > 75%)  Function- Upper Body Dressing/Undressing Upper body dressing/undressing activity did not occur: Refused What is the patient wearing?: Pull over shirt/dress Pull over shirt/dress - Perfomed by patient: Thread/unthread right sleeve, Thread/unthread left sleeve, Put head through opening, Pull shirt over trunk Assist Level: Touching or steadying assistance(Pt > 75%) Function - Lower Body Dressing/Undressing Lower body dressing/undressing activity did not occur: Refused What is the patient wearing?: Underwear, Non-skid slipper socks, Pants Position: Wheelchair/chair at sink Underwear - Performed by patient: Thread/unthread right underwear leg, Thread/unthread left underwear leg Underwear - Performed by helper: Pull underwear up/down Pants- Performed by patient: Thread/unthread right pants leg, Thread/unthread left pants leg, Pull pants up/down Pants- Performed by helper: Pull pants up/down Non-skid slipper socks- Performed by helper: Don/doff right sock, Don/doff left sock Assist for footwear: Partial/moderate assist Assist for lower body dressing: Touching or steadying assistance (Pt > 75%)  Function - Toileting Toileting activity did not occur: No continent bowel/bladder event Toileting steps completed by patient: Adjust clothing prior to toileting, Performs perineal hygiene, Adjust clothing after toileting Toileting steps completed by helper: Adjust clothing after toileting Toileting Assistive Devices: Grab bar or rail Assist level: Supervision or verbal cues  Function - Engineer, petroleum transfer assistive device: Grab bar Mechanical lift: Stedy (prior to eval) Assist level to toilet: Supervision or  verbal cues Assist level from toilet: Supervision or verbal cues  Function - Chair/bed transfer Chair/bed transfer method: Stand pivot, Ambulatory Chair/bed transfer assist level: Touching or steadying assistance (Pt > 75%) Chair/bed transfer details: Visual cues/gestures for precautions/safety  Function - Locomotion: Wheelchair Will patient use wheelchair at discharge?: No Type: Manual Max wheelchair distance: 150 Assist Level: Supervision or verbal cues Assist Level: Supervision or verbal cues Assist Level: Supervision or verbal cues Function - Locomotion: Ambulation Assistive device: No device Max distance: 150 Assist level: Touching or steadying assistance (Pt > 75%) Assist level: Touching or steadying assistance (Pt > 75%) Assist level: Touching or steadying assistance (Pt > 75%) Assist level: Touching or steadying assistance (Pt > 75%) Assist level: Moderate assist (Pt 50 - 74%)  Function - Comprehension Comprehension: Auditory Comprehension assist level: Follows basic conversation/direction with no assist  Function - Expression Expression: Verbal Expression assist level: Expresses basic needs/ideas: With no assist  Function - Social Interaction Social Interaction assist level: Interacts appropriately 90% of the time - Needs monitoring or encouragement for participation or interaction.  Function - Problem Solving Problem solving assist level: Solves basic 90% of the time/requires cueing < 10% of the time  Function - Memory Memory assist level: Recognizes or recalls 75 - 89% of the time/requires cueing 10 - 24% of the time Patient normally able to recall (first 3 days only): Current season, Location of own room, That he or she is in a hospital  Medical Problem List and Plan: 1.  Left hemiparesis and cognitive/visual deficits secondary to  right ponto-medullary infarct, the patient has decreased conjugate gaze, paramedian pontine reticular formation as well as right cranial nerve VI involvement due to his stroke,Team conference today please see physician documentation under team conference tab, met with team face-to-face to discuss problems,progress, and goals. Formulized individual treatment plan based on medical history, underlying problem and comorbidities. 2.  DVT Prophylaxis/Anticoagulation: Pharmaceutical: Lovenox- no clinical sign DVT 3. Pain Management: N/A 4. Mood: LCSW to follow for evaluatin and support.   5. Neuropsych: This patient is capable of making decisions on his own behalf. 6. Skin/Wound Care: routine pressure relief measures. Patient able to reposition himself in bed without difficulty.   7. Fluids/Electrolytes/Nutrition: Monitor I/O.creatinine borderline high otherwise normal 8. HTN: Monitor BP tid. Allow for higher blood pressure to allow adequate perfusion. Lisinopril and amlodipine on hold at at this time.  blood pressure 100/67 this morning,monitor for dizziness, check ortho vitals 9. Dyslipidemia: continue Zocor.   10. Polysubstance abuse: On CIWA protocol. No signs of withdrawal reported/noted.  11.  Urinary frequency check UA C&S, may need an anticholinergic agent 12. Constipation: Will order Miralax  and schedule Senna S daily at bedtime.  formed bowel movement 05/14/2015, continent   LOS (Days) 5 A FACE TO FACE EVALUATION WAS PERFORMED  KIRSTEINS,ANDREW E 05/16/2015, 9:26 AM

## 2015-05-16 NOTE — Progress Notes (Signed)
Physical Therapy Session Note  Patient Details  Name: Andre [name] MRN: [id number] Date of Birth: 11/27/1950  Today's Date: 05/16/2015 PT Individual Time: [id number] PT Individual Time Calculation (min): 25 min   Short Term Goals: Week 1:  PT Short Term Goal 1 (Week 1): Pt will increase bed mobility to mod I.  PT Short Term Goal 2 (Week 1): Pt will increase transfers to min A.  PT Short Term Goal 3 (Week 1): Pt will increase ambulation with LRAD to min A about 150 feet.  PT Short Term Goal 4 (Week 1): Pt will ascend/descend 4 stairs with B rails and min A.  PT Short Term Goal 5 (Week 1): Pt will propel w/c about 150 feet with mod I.   Skilled Therapeutic Interventions/Progress Updates:   Pt received in bed; pt transferred to EOB mod I and performed ambulation to gym x 150' with min A, no AD with pt noted to have lateral lean to L, decreased weight shifting and stance time on R and decreased step length LLE.  In gym performed NMR; see below for details.  Returned to room-continued gait training with verbal and visual cues provided to facilitate increased step and stride length, heel strike and weight shift/stance time on RLE.  Returned to bed with all items within reach.   Therapy Documentation Precautions:  Precautions Precautions: Fall Restrictions Weight Bearing Restrictions: No Pain: Pain Assessment Pain Assessment: No/denies pain Other Treatments: Treatments Neuromuscular Facilitation: Activity to increase coordination;Activity to increase motor control;Activity to increase timing and sequencing;Activity to increase sustained activation;Activity to increase lateral weight shifting;Activity to increase anterior-posterior weight shifting to focus on dynamic balance, postural control and dual tasking-performed reaching to floor to L and R to retrieve ball, lateral stepping to L and R x 30' each direction during dual task of ball bounce and ball toss with min A and pt demonstrating  increased difficulty with lateral stepping to R; transitioned to mat to perform quadruped with alternating UE dynamic movement >> half kneeling on R and LLE<>tall kneeling without use of UE for weight shifting and proximal stability training.     See Function Navigator for Current Functional Status.   Therapy/Group: Individual Therapy  Raylene Everts [facility] 05/16/2015, 8:24 PM

## 2015-05-16 NOTE — Progress Notes (Signed)
Occupational Therapy Session Note  Patient Details  Name: Andre [name] MRN: [id number] Date of Birth: February 22, 1951  Today's Date: 05/16/2015 OT Individual Time: 1100-1157 OT Individual Time Calculation (min): 57 min    Short Term Goals: Week 1:  OT Short Term Goal 1 (Week 1): Pt will maintain head in midline for 5 minutes duting activity OT Short Term Goal 2 (Week 1): Pt. will groom self with min assist OT Short Term Goal 3 (Week 1): Pt will bathe welf with min assit OT Short Term Goal 4 (Week 1): Pt. will dress self with min assist OT Short Term Goal 5 (Week 1): Pt will transfer to toilet with min assist  Skilled Therapeutic Interventions/Progress Updates:    Pt resting in bed upon arrival and agreeable to therapy.  Pt declined bathing and changing clothing this morning.  Pt stated he believed he needed to work on his vision the most.  Pt acknowledges that he has difficulty seeing anything on the right. Pt unable to track to right past midline.  Pt stated he had difficulty with vision (up close) prior to hospitalization but now it was even more challenging.  Pt engaged in standing tasks retrieving and placing cards on card board.  Pt able to read cards when placed approx 6 inches away.  Pt turned his head to right and brought objects into left visual field to compensate.  Pt also engaged in locating objects in gym.  Pt required min verbal cues to locate approx 50% of objects.  Pt returned to room and remained in bed with all needs within reach.  Therapy Documentation Precautions:  Precautions Precautions: Fall Restrictions Weight Bearing Restrictions: No   Pain: Pain Assessment Pain Assessment: No/denies pain Pain Score: 0-No pain  See Function Navigator for Current Functional Status.   Therapy/Group: Individual Therapy  Leroy Libman 05/16/2015, 11:58 AM

## 2015-05-17 ENCOUNTER — Inpatient Hospital Stay (HOSPITAL_COMMUNITY): Payer: Medicare Other | Admitting: Occupational Therapy

## 2015-05-17 ENCOUNTER — Inpatient Hospital Stay (HOSPITAL_COMMUNITY): Payer: Medicare Other | Admitting: Physical Therapy

## 2015-05-17 LAB — URINE MICROSCOPIC-ADD ON

## 2015-05-17 LAB — URINALYSIS, ROUTINE W REFLEX MICROSCOPIC
Bilirubin Urine: NEGATIVE
GLUCOSE, UA: NEGATIVE mg/dL
Hgb urine dipstick: NEGATIVE
Ketones, ur: NEGATIVE mg/dL
Nitrite: NEGATIVE
PH: 8 (ref 5.0–8.0)
PROTEIN: NEGATIVE mg/dL
SPECIFIC GRAVITY, URINE: 1.024 (ref 1.005–1.030)

## 2015-05-17 MED ORDER — SENNOSIDES-DOCUSATE SODIUM 8.6-50 MG PO TABS
2.0000 | ORAL_TABLET | Freq: Two times a day (BID) | ORAL | Status: DC
  Administered 2015-05-17 – 2015-05-22 (×10): 2 via ORAL
  Filled 2015-05-17 (×10): qty 2

## 2015-05-17 NOTE — Progress Notes (Signed)
Occupational Therapy Session Note  Patient Details  Name: Andre [name] MRN: [id number] Date of Birth: 1951/01/25  Today's Date: 05/17/2015 OT Individual Time: 1030-1200 OT Individual Time Calculation (min): 90 min    Short Term Goals: Week 1:  OT Short Term Goal 1 (Week 1): Pt will maintain head in midline for 5 minutes duting activity OT Short Term Goal 2 (Week 1): Pt. will groom self with min assist OT Short Term Goal 3 (Week 1): Pt will bathe welf with min assit OT Short Term Goal 4 (Week 1): Pt. will dress self with min assist OT Short Term Goal 5 (Week 1): Pt will transfer to toilet with min assist  Skilled Therapeutic Interventions/Progress Updates:    Treatment session with focus on ADL retraining, Rt visual compensatory strategies and reaction times.  Pt gathered clothing prior to bathing in room shower.  Min guard with ambulation in room and around obstacles due to Rt field cut.  Pt completed all bathing and dressing tasks with distant supervision after setup.  Pt ambulated around unit with increased time and ability to compensate for impaired Rt vision without additional cueing.  Engaged in [place] in standing with focus on Rt vision.  Pt compensates for Rt field cut with head turns.  Pt with average reaction time in Lt upper quadrant: 1.75, Lt lower quadrant: 2.29, Rt upper quadrant: 2.49, and Rt lower quadrant: 3.02 seconds.  Data reiterating Rt field cut, however as activities progressed pt with improved compensation for decreased vision.  Engaged in visual scanning task with matching colored bean bags to colored spots on floor all in Rt visual field, incorporated bending to retrieve items from floor without LOB.  Completed 9 hole peg test Rt: 43 seconds and Lt: 1:11.  Pt returned to room with supervision for mobility and vision.  Therapy Documentation Precautions:  Precautions Precautions: Fall Restrictions Weight Bearing Restrictions: No Pain: Pain Assessment Pain  Assessment: No/denies pain Pain Score: 0-No pain  See Function Navigator for Current Functional Status.   Therapy/Group: Individual Therapy  Simonne Come 05/17/2015, 12:10 PM

## 2015-05-17 NOTE — Progress Notes (Signed)
Occupational Therapy Session Note  Patient Details  Name: Andre [name] MRN: [id number] Date of Birth: [birth year]  Today's Date: 05/17/2015 OT Individual Time: 1300-1400 OT Individual Time Calculation (min): 60 min    Skilled Therapeutic Interventions/Progress Updates:    Pt ambulated to the therapy gym for session with close supervision.  He worked on Horticulturist, commercial and tracking during session.  Pt tends to maintain eye gaze to the left at rest with decreased oculomotor scanning to the right.  Pt demonstrates greater AROM in the right eye when scanning to the right compared to the left.  Pt was able to scan past midline with the right eye and just to midline to the left with both eyes when tracking object from the left visual field to the right.  He demonstrated great difficulty just shifting eyes to midline when not following a target.  Therapist provided glasses taped on the nasal portion of the right eye and the lateral portion of the left, to help influence keeping eyes more at midline and for scanning to the right.  Pt was able to wear glasses and place larger pegs in a specific hole in the peg board with consistent accuracy, requiring slightly more time than normal.  Educated pt on wearing glasses for 30 mins or so while watching TV to increase occular stabilization to more midline instead of keeping eyes to the right.  Pt left in recliner with call button within reach.    Therapy Documentation Precautions:  Precautions Precautions: Fall Restrictions Weight Bearing Restrictions: No  Pain: Pain Assessment Pain Assessment: No/denies pain ADL: See Function Navigator for Current Functional Status.   Therapy/Group: Individual Therapy  Zeev Deakins OTR/L 05/17/2015, 4:15 PM

## 2015-05-17 NOTE — Progress Notes (Signed)
Physical Therapy Session Note  Patient Details  Name: Andre [name] MRN: [id number] Date of Birth: 12/09/1950  Today's Date: 05/17/2015 PT Individual Time: 0800-0900 PT Individual Time Calculation (min): 60 min   Short Term Goals: Week 1:  PT Short Term Goal 1 (Week 1): Pt will increase bed mobility to mod I.  PT Short Term Goal 2 (Week 1): Pt will increase transfers to min A.  PT Short Term Goal 3 (Week 1): Pt will increase ambulation with LRAD to min A about 150 feet.  PT Short Term Goal 4 (Week 1): Pt will ascend/descend 4 stairs with B rails and min A.  PT Short Term Goal 5 (Week 1): Pt will propel w/c about 150 feet with mod I.   Skilled Therapeutic Interventions/Progress Updates:    Pt received seated in bed finishing breakfast, no c/o pain and agreeable to treatment. Therapist retrieved shower chair for pt's SO to shower in room. Pt ambulated into bathroom and seated on toilet with S; performed all clothing management and hygiene with modI. Gait to gym x175' with S. Kinetron 4 x2 min with cues/facilitation for B hip extension in stance, trunk extension, and forward gaze with visual target in front of pt for R attention, tracking. Standing balance with toe taps on cones with majority of cones on R side to facilitate R scanning and attention; close S for standing balance overall however occasional min/modA due to LOB. Dynamic gait in gym with scavenger hunt to locate colored bean bags to facilitate R visual scanning. Min verbal cues for scanning to locate items. Returned to room gait x175' with S. Remained supine in bed with alarm intact and all needs in reach at completion of session.   Therapy Documentation Precautions:  Precautions Precautions: Fall Restrictions Weight Bearing Restrictions: No Pain: Pain Assessment Pain Assessment: No/denies pain Pain Score: 0-No pain   See Function Navigator for Current Functional Status.   Therapy/Group: Individual Therapy  Luberta Mutter 05/17/2015, 10:16 AM

## 2015-05-17 NOTE — Progress Notes (Signed)
Social Work Patient ID: Andre [name], male   DOB: 11-15-1950, 65 y.o.   MRN: [id number] Met with pt to inform [facility]. He has an appointment 1/24 @ 3;15 pm with Dr. Randell Patient.  Pt pleased with this since he knows where it is located and his MD recently passed away.

## 2015-05-17 NOTE — Plan of Care (Signed)
Problem: RH Toileting Goal: LTG Patient will perform toileting w/assist, cues/equip (OT) LTG: Patient will perform toiletiing (clothes management/hygiene) with assist, with/without cues using equipment (OT)  Upgraded due to progress  Problem: RH Toilet Transfers Goal: LTG Patient will perform toilet transfers w/assist (OT) LTG: Patient will perform toilet transfers with assist, with/without cues using equipment (OT)  Upgraded due to progress

## 2015-05-17 NOTE — Progress Notes (Signed)
65 y.o. male with history of CVA with mild LLE weakness, polysubstance abuse who was admitted on 05/08/15 with left sided weakness and left facial droop. Patient had been out drinking with friends the night before and woke up the next day with worsening of left sided weakness. UDS positive for cocaine. CT head negative and CTA head/neck showed 60% stenosis proximal R-ICA and 50% stenosis L-ICA, moderate stenosis distal B-Va and mid basilar artery. MRI brain done revealing acute non-hemorrhagic infarct in right pons and medulla.   Subjective/Complaints: Ambulating with physical therapy this morning. No double vision  Review of systems denies any chest pain shortness breath nausea vomiting diarrhea or constipation  Objective: Vital Signs: Blood pressure 118/68, pulse 67, temperature 98.4 F (36.9 C), temperature source Oral, resp. rate 18, height 5\' 6"  (1.676 m), weight 76.023 kg (167 lb 9.6 oz), SpO2 97 %. No results found. No results found for this or any previous visit (from the past 72 hour(s)).   HEENT: eye patch Cardio: RRR and no murmur Resp: CTA B/L and unlabored GI: BS positive and NT, ND Extremity:  Pulses positive and No Edema Skin:   Intact Neuro: Alert/Oriented, Cranial Nerve Abnormalities Left medial rectus stronger, L pupil passes midline slightly, Right lateral rectus still paretic, Normal Sensory, Abnormal Motor 4-/5 LLE 5/5 RUE, 4+ in BUE and Abnormal FMC Ataxic/ dec FMC Musc/Skel:  Other no pain with UE or LE ROM Gen NAD   Assessment/Plan: 1. 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 Physiatrist is providing close team supervision and 24 hour management of active medical problems listed below. Physiatrist and rehab team continue to assess barriers to discharge/monitor patient  progress toward functional and medical goals. FIM: Function - Bathing Bathing activity did not occur: Refused Position: Shower Body parts bathed by patient: Right arm, Left arm, Chest, Abdomen, Front perineal area, Right upper leg, Left upper leg Body parts bathed by helper: Buttocks, Left lower leg, Back, Right lower leg Assist Level: Touching or steadying assistance(Pt > 75%)  Function- Upper Body Dressing/Undressing Upper body dressing/undressing activity did not occur: Refused What is the patient wearing?: Pull over shirt/dress Pull over shirt/dress - Perfomed by patient: Thread/unthread right sleeve, Thread/unthread left sleeve, Put head through opening, Pull shirt over trunk Assist Level: Touching or steadying assistance(Pt > 75%) Function - Lower Body Dressing/Undressing Lower body dressing/undressing activity did not occur: Refused What is the patient wearing?: Underwear, Non-skid slipper socks, Pants Position: Wheelchair/chair at sink Underwear - Performed by patient: Thread/unthread right underwear leg, Thread/unthread left underwear leg Underwear - Performed by helper: Pull underwear up/down Pants- Performed by patient: Thread/unthread right pants leg, Thread/unthread left pants leg, Pull pants up/down Pants- Performed by helper: Pull pants up/down Non-skid slipper socks- Performed by helper: Don/doff right sock, Don/doff left sock Assist for footwear: Partial/moderate assist Assist for lower body dressing: Touching or steadying assistance (Pt > 75%)  Function - Toileting Toileting activity did not occur: No continent bowel/bladder event Toileting steps completed by patient: Adjust clothing prior to toileting, Performs perineal hygiene, Adjust clothing after toileting Toileting steps completed by helper: Adjust clothing after toileting Toileting Assistive Devices: Grab bar or rail Assist level: Supervision or verbal cues  Function - Air cabin crew transfer  assistive device: Grab bar Mechanical lift: Stedy (prior to eval) Assist level to toilet: Supervision or verbal cues Assist level  from toilet: Supervision or verbal cues  Function - Chair/bed transfer Chair/bed transfer method: Stand pivot, Ambulatory Chair/bed transfer assist level: Supervision or verbal cues Chair/bed transfer details: Verbal cues for precautions/safety  Function - Locomotion: Wheelchair Will patient use wheelchair at discharge?: No Type: Manual Max wheelchair distance: 150 Assist Level: Supervision or verbal cues Assist Level: Supervision or verbal cues Assist Level: Supervision or verbal cues Function - Locomotion: Ambulation Assistive device: No device Max distance: 300 Assist level: Supervision or verbal cues Assist level: Supervision or verbal cues Assist level: Supervision or verbal cues Assist level: Supervision or verbal cues Assist level: Moderate assist (Pt 50 - 74%)  Function - Comprehension Comprehension: Auditory Comprehension assist level: Follows basic conversation/direction with no assist  Function - Expression Expression: Verbal Expression assist level: Expresses basic needs/ideas: With no assist  Function - Social Interaction Social Interaction assist level: Interacts appropriately 90% of the time - Needs monitoring or encouragement for participation or interaction.  Function - Problem Solving Problem solving assist level: Solves basic 90% of the time/requires cueing < 10% of the time  Function - Memory Memory assist level: Recognizes or recalls 75 - 89% of the time/requires cueing 10 - 24% of the time Patient normally able to recall (first 3 days only): Current season, Location of own room, That he or she is in a hospital  Medical Problem List and Plan: 1.  Left hemiparesis and cognitive/visual deficits secondary to right ponto-medullary infarct, the patient has decreased conjugate gaze, paramedian pontine reticular formation (this is  improving)as well as right cranial nerve VI involvement due to his stroke,expect D/C 1/17                  2.  DVT Prophylaxis/Anticoagulation: Pharmaceutical: Lovenox- no clinical sign DVT 3. Pain Management: N/A 4. Mood: LCSW to follow for evaluatin and support.   5. Neuropsych: This patient is capable of making decisions on his own behalf. 6. Skin/Wound Care: routine pressure relief measures. Patient able to reposition himself in bed without difficulty.   7. Fluids/Electrolytes/Nutrition: Monitor I/O.creatinine borderline high otherwise normal 8. HTN: Monitor BP tid. Allow for higher blood pressure to allow adequate perfusion. Lisinopril and amlodipine on hold at at this time.  blood pressure 118/68 no dizziness during gait 9. Dyslipidemia: continue Zocor.   10. Polysubstance abuse: On CIWA protocol. No signs of withdrawal reported/noted.  11.  Urinary frequency check UA C&S, may need an anticholinergic agent 12. Constipation:recurrent  Cont  Miralax  and schedule Senna S 2 BID.  formed bowel movement 05/14/2015, continent   LOS (Days) 6 A FACE TO FACE EVALUATION WAS PERFORMED  Afrah Burlison E 05/17/2015, 8:04 AM

## 2015-05-18 ENCOUNTER — Inpatient Hospital Stay (HOSPITAL_COMMUNITY): Payer: Medicare Other | Admitting: Occupational Therapy

## 2015-05-18 ENCOUNTER — Inpatient Hospital Stay (HOSPITAL_COMMUNITY): Payer: Medicare Other | Admitting: Physical Therapy

## 2015-05-18 LAB — URINE CULTURE: Special Requests: NORMAL

## 2015-05-18 LAB — CREATININE, SERUM
CREATININE: 1.38 mg/dL — AB (ref 0.61–1.24)
GFR calc non Af Amer: 53 mL/min — ABNORMAL LOW (ref >60)

## 2015-05-18 NOTE — Progress Notes (Signed)
Physical Therapy Weekly Progress Note  Patient Details  Name: Andre [name] MRN: [id number] Date of Birth: [birth year]  Beginning of progress report period: May 12, 2015 End of progress report period: May 18, 2015  Today's Date: 05/18/2015 PT Individual Time: 1000-1105 PT Individual Time Calculation (min): 65 min   Patient has met 5 of 5 short term goals.  Pt is S level overall, with cueing needed for R attention and visual scanning for safety during mobility tasks. Continues to demonstrate gait abnormalities and balance impairments, with occasional staggering and mild LOB during ambulation however pt is able to recover without assistance. Therapy sessions continue to focus on balance, proprioception, and righting reactions for safety. Additionally, treatments integrate R attention and visual scanning throughout to improve pt safety and independence in home and community environment.   Patient continues to demonstrate the following deficits: impaired balance, coordination, activity tolerance, strength and therefore will continue to benefit from skilled PT intervention to enhance overall performance with activity tolerance, balance, postural control, ability to compensate for deficits, functional use of  left upper extremity and left lower extremity, attention, awareness and coordination.  Patient progressing toward long term goals..  Continue plan of care.  PT Short Term Goals Week 1:  PT Short Term Goal 1 (Week 1): Pt will increase bed mobility to mod I.  PT Short Term Goal 1 - Progress (Week 1): Met PT Short Term Goal 2 (Week 1): Pt will increase transfers to min A.  PT Short Term Goal 2 - Progress (Week 1): Met PT Short Term Goal 3 (Week 1): Pt will increase ambulation with LRAD to min A about 150 feet.  PT Short Term Goal 3 - Progress (Week 1): Met PT Short Term Goal 4 (Week 1): Pt will ascend/descend 4 stairs with B rails and min A.  PT Short Term Goal 4 - Progress (Week 1):  Met PT Short Term Goal 5 (Week 1): Pt will propel w/c about 150 feet with mod I.  PT Short Term Goal 5 - Progress (Week 1): Met Week 2:  PT Short Term Goal 1 (Week 2): = LTG due to estimated length of stay  Skilled Therapeutic Interventions/Progress Updates:    Pt received seated in recliner, no c/o pain and agreeable to treatment. Gait [phone] to gym with S. Nustep x12 min on level 4 with BUE, BLE for strengthening and endurance. Gait in community setting including indoors/outdoors, level/unlevel surfaces, stairs 4 x12 6" steps with S overall. One minor LOB on stairs with improper foot placement, however recovered without assist from therapist. One bathroom transfer performed with mod I. Several seated rest breaks due to fatigue. Utilized rest breaks to educate regarding increased stroke risk, signs/symptoms of stroke and importance of immediate action to seek medical treatment, lifestyle factors including smoking, sedentary lifestyle contributing to stroke risk. Also educated regarding R visual scanning, effectiveness of head turning compensation in the short term but importance of continuing to challenge ocular muscles for re-education and neuroplasticity. Pt returned to room and remained seated in recliner at completion of session, all needs within reach.   Therapy Documentation Precautions:  Precautions Precautions: Fall Restrictions Weight Bearing Restrictions: No Pain: Pain Assessment Pain Assessment: No/denies pain Pain Score: 0-No pain   See Function Navigator for Current Functional Status.  Therapy/Group: Individual Therapy  Luberta Mutter 05/18/2015, 11:17 AM

## 2015-05-18 NOTE — Progress Notes (Signed)
65 y.o. male with history of CVA with mild LLE weakness, polysubstance abuse who was admitted on 05/08/15 with left sided weakness and left facial droop. Patient had been out drinking with friends the night before and woke up the next day with worsening of left sided weakness. UDS positive for cocaine. CT head negative and CTA head/neck showed 60% stenosis proximal R-ICA and 50% stenosis L-ICA, moderate stenosis distal B-Va and mid basilar artery. MRI brain done revealing acute non-hemorrhagic infarct in right pons and medulla.   Subjective/Complaints: Eating without cough  Review of systems denies any chest pain shortness breath nausea vomiting diarrhea or constipation  Objective: Vital Signs: Blood pressure 118/75, pulse 92, temperature 98.7 F (37.1 C), temperature source Oral, resp. rate 18, height _0  (1.676 m), weight 76.023 kg (167 lb 9.6 oz), SpO2 98 %. No results found. Results for orders placed or performed during the hospital encounter of 05/11/15 (from the past 72 hour(s))  Urinalysis, Routine w reflex microscopic (not at [facility])     Status: Abnormal   Collection Time: 05/17/15 10:41 AM  Result Value Ref Range   Color, Urine AMBER (A) YELLOW    Comment: BIOCHEMICALS MAY BE AFFECTED BY COLOR   APPearance CLEAR CLEAR   Specific Gravity, Urine 1.024 1.005 - 1.030   pH 8.0 5.0 - 8.0   Glucose, UA NEGATIVE NEGATIVE mg/dL   Hgb urine dipstick NEGATIVE NEGATIVE   Bilirubin Urine NEGATIVE NEGATIVE   Ketones, ur NEGATIVE NEGATIVE mg/dL   Protein, ur NEGATIVE NEGATIVE mg/dL   Nitrite NEGATIVE NEGATIVE   Leukocytes, UA SMALL (A) NEGATIVE  Urine microscopic-add on     Status: Abnormal   Collection Time: 05/17/15 10:41 AM  Result Value Ref Range   Squamous Epithelial / LPF 0-5 (A) NONE SEEN   WBC, UA 6-30 0 - 5 WBC/hpf   RBC / HPF 0-5 0 - 5 RBC/hpf   Bacteria, UA RARE (A) NONE SEEN  Creatinine, serum     Status: Abnormal   Collection Time: 05/18/15  5:23 AM  Result Value Ref Range    Creatinine, Ser 1.38 (H) 0.61 - 1.24 mg/dL   GFR calc non Af Amer 53 (L) >60 mL/min   GFR calc Af Amer >60 >60 mL/min    Comment: (NOTE) The eGFR has been calculated using the CKD EPI equation. This calculation has not been validated in all clinical situations. eGFR's persistently <60 mL/min signify possible Chronic Kidney Disease.      HEENT: eye patch Cardio: RRR and no murmur Resp: CTA B/L and unlabored GI: BS positive and NT, ND Extremity:  Pulses positive and No Edema Skin:   Intact Neuro: Alert/Oriented, Cranial Nerve Abnormalities Left medial rectus stronger, L pupil passes midline slightly, Right lateral rectus still paretic, Normal Sensory, Abnormal Motor 4-/5 LLE 5/5 RUE, 4+ in BUE and Abnormal FMC Ataxic/ dec FMC Musc/Skel:  Other no pain with UE or LE ROM Gen NAD   Assessment/Plan: 1. 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 Physiatrist is providing close team supervision and 24 hour management of active medical problems listed below. Physiatrist and rehab team continue to assess barriers to discharge/monitor patient progress toward functional and medical goals. FIM: Function - Bathing Bathing activity did not occur: Refused Position: Shower Body parts bathed by patient: Right arm, Left arm, Chest, Abdomen, Front perineal area,  Right upper leg, Left upper leg, Buttocks, Right lower leg, Left lower leg, Back Body parts bathed by helper: Buttocks, Left lower leg, Back, Right lower leg Assist Level: Supervision or verbal cues  Function- Upper Body Dressing/Undressing Upper body dressing/undressing activity did not occur: Refused What is the patient wearing?: Pull over shirt/dress Pull over shirt/dress - Perfomed by patient: Thread/unthread right sleeve, Thread/unthread left  sleeve, Put head through opening, Pull shirt over trunk Assist Level: Set up Set up : To obtain clothing/put away Function - Lower Body Dressing/Undressing Lower body dressing/undressing activity did not occur: Refused What is the patient wearing?: Underwear, Pants, Socks, Shoes Position: Sitting EOB Underwear - Performed by patient: Thread/unthread right underwear leg, Thread/unthread left underwear leg, Pull underwear up/down Underwear - Performed by helper: Pull underwear up/down Pants- Performed by patient: Thread/unthread right pants leg, Thread/unthread left pants leg, Pull pants up/down Pants- Performed by helper: Pull pants up/down Non-skid slipper socks- Performed by helper: Don/doff right sock, Don/doff left sock Socks - Performed by patient: Don/doff right sock, Don/doff left sock Shoes - Performed by patient: Don/doff right shoe, Don/doff left shoe, Fasten right, Fasten left Assist for footwear: Setup Assist for lower body dressing: Set up Set up : To obtain clothing/put away  Function - Toileting Toileting activity did not occur: No continent bowel/bladder event Toileting steps completed by patient: Adjust clothing prior to toileting, Performs perineal hygiene, Adjust clothing after toileting Toileting steps completed by helper: Adjust clothing after toileting Toileting Assistive Devices: Grab bar or rail Assist level: Supervision or verbal cues  Function - Toilet Transfers Toilet transfer assistive device: Grab bar Mechanical lift: Stedy (prior to eval) Assist level to toilet: Supervision or verbal cues (Pt stood to urinate) Assist level from toilet: Supervision or verbal cues  Function - Chair/bed transfer Chair/bed transfer method: Stand pivot, Ambulatory Chair/bed transfer assist level: Supervision or verbal cues Chair/bed transfer details: Verbal cues for precautions/safety  Function - Locomotion: Wheelchair Will patient use wheelchair at discharge?: No Type:  Manual Max wheelchair distance: 150 Assist Level: Supervision or verbal cues Assist Level: Supervision or verbal cues Assist Level: Supervision or verbal cues Function - Locomotion: Ambulation Assistive device: No device Max distance: 175 Assist level: Supervision or verbal cues Assist level: Supervision or verbal cues Assist level: Supervision or verbal cues Assist level: Supervision or verbal cues Assist level: Moderate assist (Pt 50 - 74%)  Function - Comprehension Comprehension: Auditory Comprehension assist level: Follows basic conversation/direction with no assist  Function - Expression Expression: Verbal Expression assist level: Expresses basic needs/ideas: With no assist  Function - Social Interaction Social Interaction assist level: Interacts appropriately 90% of the time - Needs monitoring or encouragement for participation or interaction.  Function - Problem Solving Problem solving assist level: Solves basic 90% of the time/requires cueing < 10% of the time  Function - Memory Memory assist level: Recognizes or recalls 75 - 89% of the time/requires cueing 10 - 24% of the time Patient normally able to recall (first 3 days only): Current season, Location of own room, That he or she is in a hospital  Medical Problem List and Plan: 1.  Left hemiparesis and cognitive/visual deficits secondary to right ponto-medullary infarct, the patient has decreased conjugate gaze, paramedian pontine reticular formation as well as right cranial nerve VI involvement due to his stroke,expect D/C 1/17                  2.  DVT Prophylaxis/Anticoagulation: Pharmaceutical: Lovenox- no clinical sign DVT 3.  Pain Management: N/A 4. Mood: LCSW to follow for evaluatin and support.   5. Neuropsych: This patient is capable of making decisions on his own behalf. 6. Skin/Wound Care: routine pressure relief measures. Patient able to reposition himself in bed without difficulty.   7.  Fluids/Electrolytes/Nutrition: Monitor I/O.creatinine borderline high otherwise normal 8. HTN: Monitor BP tid. Allow for higher blood pressure to allow adequate perfusion. Lisinopril and amlodipine on hold at at this time.  blood pressure 118/75  9. Dyslipidemia: continue Zocor.   10. Polysubstance abuse: On CIWA protocol. No signs of withdrawal reported/noted.  11.  Urinary frequency check UA C&S, may need an anticholinergic agent 12. Constipation:recurrent  Cont  Miralax  and schedule Senna S 2 BID.  Pt states he had BM today but not recorded   LOS (Days) 7 A FACE TO FACE EVALUATION WAS PERFORMED  Haven Foss E 05/18/2015, 8:30 AM

## 2015-05-18 NOTE — Progress Notes (Signed)
Occupational Therapy Weekly Progress Note  Patient Details  Name: Andre [name] MRN: [id number] Date of Birth: 07-31-1950  Beginning of progress report period: May 12, 2015 End of progress report period: May 18, 2015  Today's Date: 05/18/2015 OT Individual Time: 0630-1601 and 1300-1430 OT Individual Time Calculation (min): 60 min and 90 min   Patient has met 5 of 5 short term goals.  Pt making quick progress towards goals.  Pt's mobility and safety continue to be limited due to impaired occular motor in both eyes with limited scanning past midline to Rt visual field, however pt able to compensate with min cues.  Supervision with self-care tasks at sit > stand level for safety due to mildly impaired balance.  Patient continues to demonstrate the following deficits: vision impairments, LUE ataxia, impaired balance and therefore will continue to benefit from skilled OT intervention to enhance overall performance with BADL, iADL and Reduce care partner burden.  Patient progressing toward long term goals..  Plan of care revisions: upgraded toilet transfers to Mod I.  OT Short Term Goals Week 1:  OT Short Term Goal 1 (Week 1): Pt will maintain head in midline for 5 minutes duting activity OT Short Term Goal 1 - Progress (Week 1): Met OT Short Term Goal 2 (Week 1): Pt. will groom self with min assist OT Short Term Goal 2 - Progress (Week 1): Met OT Short Term Goal 3 (Week 1): Pt will bathe welf with min assit OT Short Term Goal 3 - Progress (Week 1): Met OT Short Term Goal 4 (Week 1): Pt. will dress self with min assist OT Short Term Goal 4 - Progress (Week 1): Met OT Short Term Goal 5 (Week 1): Pt will transfer to toilet with min assist OT Short Term Goal 5 - Progress (Week 1): Met Week 2:  OT Short Term Goal 1 (Week 2): STG = LTG due to remaining LOS  Skilled Therapeutic Interventions/Progress Updates:    1) Treatment session with focus on Rt oculomotor activities to promote  scanning to and tracking into Rt visual field. Ambulated to therapy gym with distant supervision.  Engaged in standing activity with focus on visually scanning to Rt to obtain items and functional use of LUE to address mild ataxia with stacking cups.  3D pipe tree puzzle in standing with focus on following picture instructions placed on Rt to replicate design.  Pt required mod question cues to identify errors, also required bringing instructions on Rt closer to face and midline to ensure proper design.  Pt with improved tracking in Rt eye, marginally passed midline to Rt.  Pt reports clearer vision with increased ability to read clock on wall (on Lt).  2) Treatment session with focus on Rt oculomotor scanning and tracking during table top task and in community ambulation.  Engaged in jigsaw puzzle in sitting with focus on scanning to Lt for picture, requiring mod cues for sequencing and problem solving with orientation of puzzle pieces.  Engaged in scavenger hunt in gift shop with focus on scanning environment to locate 8 items with pt requiring constant reminders for list of items and cues to scan to Rt.  Pt demonstrated path finding back to room with min question cues.  Therapy Documentation Precautions:  Precautions Precautions: Fall Restrictions Weight Bearing Restrictions: No Pain:  Pt with no c/o pain  See Function Navigator for Current Functional Status.   Therapy/Group: Individual Therapy  Simonne Come 05/18/2015, 9:33 AM

## 2015-05-19 ENCOUNTER — Inpatient Hospital Stay (HOSPITAL_COMMUNITY): Payer: Medicare Other | Admitting: Occupational Therapy

## 2015-05-19 DIAGNOSIS — R350 Frequency of micturition: Secondary | ICD-10-CM

## 2015-05-19 NOTE — Progress Notes (Addendum)
Occupational Therapy Session Note  Patient Details  Name: Andre [name] MRN: [id number] Date of Birth: [birth year]  Today's Date: 05/19/2015 OT Individual Time: 1000-1100 OT Individual Time Calculation (min): 60 min    Short Term Goals: Week 2:  OT Short Term Goal 1 (Week 2): STG = LTG due to remaining LOS  Skilled Therapeutic Interventions/Progress Updates:    Treatment session with focus on functional ambulation and Rt visual attention during functional mobility and structured task to promote eye movements and tracking and scanning to Rt.  Engaged in Reaction time setting on [employer] with focus on visual scanning to Rt of midline.  Pt demonstrating average reaction time of 1.79 seconds with linear scanning with visual reaction time 0.89 seconds and motor reaction time 0.90 and with scanning in whole hemisphere average reaction time 1.79 seconds again, however visual 1.15 seconds and motor 0.63 seconds. Engaged in scanning activity to Rt visual field to locate specific items as called out by therapist with pt requiring increased time to fully scan Rt environment, compensating with head turns.  Ball toss in sitting and standing with focus on bimanual coordination and attention to Rt.  Therapy Documentation Precautions:  Precautions Precautions: Fall Restrictions Weight Bearing Restrictions: No Pain: Pain Assessment Pain Assessment: No/denies pain  See Function Navigator for Current Functional Status.   Therapy/Group: Individual Therapy  Simonne Come 05/19/2015, 12:13 PM

## 2015-05-19 NOTE — Progress Notes (Signed)
Subjective/Complaints: Patient seen this morning resting in bed comfortably. He denies dizziness blurry vision or diplopia. He states he is feeling good.  Review of systems: Denies dizziness, blurred vision, diplopia, CP, SOB, N/V/D  Objective: Vital Signs: Blood pressure 98/64, pulse 79, temperature 98.4 F (36.9 C), temperature source Oral, resp. rate 20, height '5\' 6"'$  (1.676 m), weight 76.023 kg (167 lb 9.6 oz), SpO2 99 %. No results found. Results for orders placed or performed during the hospital encounter of 05/11/15 (from the past 72 hour(s))  Urinalysis, Routine w reflex microscopic (not at [facility])     Status: Abnormal   Collection Time: 05/17/15 10:41 AM  Result Value Ref Range   Color, Urine AMBER (A) YELLOW    Comment: BIOCHEMICALS MAY BE AFFECTED BY COLOR   APPearance CLEAR CLEAR   Specific Gravity, Urine 1.024 1.005 - 1.030   pH 8.0 5.0 - 8.0   Glucose, UA NEGATIVE NEGATIVE mg/dL   Hgb urine dipstick NEGATIVE NEGATIVE   Bilirubin Urine NEGATIVE NEGATIVE   Ketones, ur NEGATIVE NEGATIVE mg/dL   Protein, ur NEGATIVE NEGATIVE mg/dL   Nitrite NEGATIVE NEGATIVE   Leukocytes, UA SMALL (A) NEGATIVE  Urine culture     Status: None   Collection Time: 05/17/15 10:41 AM  Result Value Ref Range   Specimen Description URINE, CLEAN CATCH    Special Requests Normal    Culture MULTIPLE SPECIES PRESENT, SUGGEST RECOLLECTION    Report Status 05/18/2015 FINAL   Urine microscopic-add on     Status: Abnormal   Collection Time: 05/17/15 10:41 AM  Result Value Ref Range   Squamous Epithelial / LPF 0-5 (A) NONE SEEN   WBC, UA 6-30 0 - 5 WBC/hpf   RBC / HPF 0-5 0 - 5 RBC/hpf   Bacteria, UA RARE (A) NONE SEEN  Creatinine, serum     Status: Abnormal   Collection Time: 05/18/15  5:23 AM  Result Value Ref Range   Creatinine, Ser 1.38 (H) 0.61 - 1.24 mg/dL   GFR calc non Af Amer 53 (L) >60 mL/min   GFR calc Af Amer >60 >60 mL/min    Comment: (NOTE) The eGFR has been calculated using the CKD  EPI equation. This calculation has not been validated in all clinical situations. eGFR's persistently <60 mL/min signify possible Chronic Kidney Disease.     Gen NAD. Vital signs reviewed HEENT: eye patch Cardio: RRR and no murmur Resp: CTA B/L and unlabored GI: BS positive and NT, ND Musc/Skel:  No tenderness. No Edema Neuro: Alert/Oriented Motor 4/5 LLE 5/5 RUE, 4+ in BUE Skin:   Intact. Warm and dry  Assessment/Plan: 1. 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 Physiatrist is providing close team supervision and 24 hour management of active medical problems listed below. Physiatrist and rehab team continue to assess barriers to discharge/monitor patient progress toward functional and medical goals. FIM: Function - Bathing Bathing activity did not occur: Refused Position: Shower Body parts bathed by patient: Right arm, Left arm, Chest, Abdomen, Front perineal area, Right upper leg, Left upper leg, Buttocks, Right lower leg, Left lower leg, Back Body parts bathed by helper: Buttocks, Left lower leg, Back, Right lower leg Assist Level: Supervision or verbal cues  Function- Upper Body Dressing/Undressing Upper body dressing/undressing activity did not occur: Refused What is the patient wearing?: Pull over shirt/dress Pull over  shirt/dress - Perfomed by patient: Thread/unthread right sleeve, Thread/unthread left sleeve, Put head through opening, Pull shirt over trunk Assist Level: Set up Set up : To obtain clothing/put away Function - Lower Body Dressing/Undressing Lower body dressing/undressing activity did not occur: Refused What is the patient wearing?: Shoes Position: Sitting EOB Underwear - Performed by patient: Thread/unthread right underwear leg, Thread/unthread left underwear leg,  Pull underwear up/down Underwear - Performed by helper: Pull underwear up/down Pants- Performed by patient: Thread/unthread right pants leg, Thread/unthread left pants leg, Pull pants up/down Pants- Performed by helper: Pull pants up/down Non-skid slipper socks- Performed by helper: Don/doff right sock, Don/doff left sock Socks - Performed by patient: Don/doff right sock, Don/doff left sock Shoes - Performed by patient: Don/doff right shoe, Don/doff left shoe, Fasten right, Fasten left Assist for footwear: Setup Assist for lower body dressing: Set up Set up : To obtain clothing/put away  Function - Toileting Toileting activity did not occur: No continent bowel/bladder event Toileting steps completed by patient: Adjust clothing prior to toileting, Performs perineal hygiene, Adjust clothing after toileting Toileting steps completed by helper: Adjust clothing after toileting Toileting Assistive Devices: Grab bar or rail Assist level: Supervision or verbal cues  Function - Toilet Transfers Toilet transfer assistive device: Grab bar Mechanical lift: Stedy (prior to eval) Assist level to toilet: Supervision or verbal cues Assist level from toilet: Supervision or verbal cues  Function - Chair/bed transfer Chair/bed transfer method: Stand pivot, Ambulatory Chair/bed transfer assist level: Supervision or verbal cues Chair/bed transfer details: Verbal cues for precautions/safety  Function - Locomotion: Wheelchair Will patient use wheelchair at discharge?: No Type: Manual Max wheelchair distance: 150 Assist Level: Supervision or verbal cues Assist Level: Supervision or verbal cues Assist Level: Supervision or verbal cues Function - Locomotion: Ambulation Assistive device: No device Max distance: 250 Assist level: Supervision or verbal cues Assist level: Supervision or verbal cues Assist level: Supervision or verbal cues Assist level: Supervision or verbal cues Assist level:  Supervision or verbal cues  Function - Comprehension Comprehension: Auditory Comprehension assist level: Follows basic conversation/direction with no assist  Function - Expression Expression: Verbal Expression assist level: Expresses basic needs/ideas: With no assist  Function - Social Interaction Social Interaction assist level: Interacts appropriately 90% of the time - Needs monitoring or encouragement for participation or interaction.  Function - Problem Solving Problem solving assist level: Solves basic 90% of the time/requires cueing < 10% of the time  Function - Memory Memory assist level: Recognizes or recalls 75 - 89% of the time/requires cueing 10 - 24% of the time Patient normally able to recall (first 3 days only): Current season, Location of own room, That he or she is in a hospital  Medical Problem List and Plan: 1.  Left hemiparesis and cognitive/visual deficits secondary to right ponto-medullary infarct, the patient has decreased conjugate gaze, paramedian pontine reticular formation as well as right cranial nerve VI involvement due to his stroke, expect D/C 1/17                   2.  DVT Prophylaxis/Anticoagulation: Pharmaceutical: Lovenox- no clinical sign DVT 3. Pain Management: N/A 4. Mood: LCSW to follow for evaluatin and support.   5. Neuropsych: This patient is capable of making decisions on his own behalf. 6. Skin/Wound Care: routine pressure relief measures. Patient able to reposition himself in bed without difficulty.   7. Fluids/Electrolytes/Nutrition: Monitor I/O.creatinine borderline high otherwise normal 8. HTN: Monitor BP. Allow for higher blood pressure to  allow adequate perfusion. Lisinopril and amlodipine on hold at at this time.    Blood pressure 98/64 this AM 9. Dyslipidemia: continue Zocor.   10. Polysubstance abuse: On CIWA protocol. No signs of withdrawal reported/noted.  11.  Urinary frequency   Urine pending C&S  May need an anticholinergic  agent 12. Constipation:  recurrent  Cont  Miralax  and schedule Senna S 2 BID.     BM 11/13   LOS (Days) 8 A FACE TO FACE EVALUATION WAS PERFORMED  Andre [name] Lorie Phenix 05/19/2015, 7:53 AM

## 2015-05-20 ENCOUNTER — Inpatient Hospital Stay (HOSPITAL_COMMUNITY): Payer: Medicare Other | Admitting: Physical Therapy

## 2015-05-20 MED ORDER — POLYETHYLENE GLYCOL 3350 17 G PO PACK
17.0000 g | PACK | Freq: Every day | ORAL | Status: DC
  Administered 2015-05-20 – 2015-05-22 (×3): 17 g via ORAL
  Filled 2015-05-20 (×3): qty 1

## 2015-05-20 NOTE — Progress Notes (Signed)
Subjective/Complaints: Patient seen this morning lying in bed. He states "gas aint going:"  Review of systems: + Constipation. Denies dizziness, blurred vision, diplopia, CP, SOB, N/V/D  Objective: Vital Signs: Blood pressure 109/64, pulse 71, temperature 98 F (36.7 C), temperature source Oral, resp. rate 18, height 5' 6"  (1.676 m), weight 76.023 kg (167 lb 9.6 oz), SpO2 98 %. No results found. Results for orders placed or performed during the hospital encounter of 05/11/15 (from the past 72 hour(s))  Urinalysis, Routine w reflex microscopic (not at [facility])     Status: Abnormal   Collection Time: 05/17/15 10:41 AM  Result Value Ref Range   Color, Urine AMBER (A) YELLOW    Comment: BIOCHEMICALS MAY BE AFFECTED BY COLOR   APPearance CLEAR CLEAR   Specific Gravity, Urine 1.024 1.005 - 1.030   pH 8.0 5.0 - 8.0   Glucose, UA NEGATIVE NEGATIVE mg/dL   Hgb urine dipstick NEGATIVE NEGATIVE   Bilirubin Urine NEGATIVE NEGATIVE   Ketones, ur NEGATIVE NEGATIVE mg/dL   Protein, ur NEGATIVE NEGATIVE mg/dL   Nitrite NEGATIVE NEGATIVE   Leukocytes, UA SMALL (A) NEGATIVE  Urine culture     Status: None   Collection Time: 05/17/15 10:41 AM  Result Value Ref Range   Specimen Description URINE, CLEAN CATCH    Special Requests Normal    Culture MULTIPLE SPECIES PRESENT, SUGGEST RECOLLECTION    Report Status 05/18/2015 FINAL   Urine microscopic-add on     Status: Abnormal   Collection Time: 05/17/15 10:41 AM  Result Value Ref Range   Squamous Epithelial / LPF 0-5 (A) NONE SEEN   WBC, UA 6-30 0 - 5 WBC/hpf   RBC / HPF 0-5 0 - 5 RBC/hpf   Bacteria, UA RARE (A) NONE SEEN  Creatinine, serum     Status: Abnormal   Collection Time: 05/18/15  5:23 AM  Result Value Ref Range   Creatinine, Ser 1.38 (H) 0.61 - 1.24 mg/dL   GFR calc non Af Amer 53 (L) >60 mL/min   GFR calc Af Amer >60 >60 mL/min    Comment: (NOTE) The eGFR has been calculated using the CKD EPI equation. This calculation has not been  validated in all clinical situations. eGFR's persistently <60 mL/min signify possible Chronic Kidney Disease.     Gen NAD. Vital signs reviewed HEENT: eye patch Cardio: RRR and no murmur Resp: CTA B/L and unlabored GI: BS positive and NT, ND Musc/Skel:  No tenderness. No Edema Neuro: Alert/Oriented Motor 4/5 LLE 5/5 RUE, 4+ in BUE Skin:   Intact. Warm and dry  Assessment/Plan: 1. 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 Physiatrist is providing close team supervision and 24 hour management of active medical problems listed below. Physiatrist and rehab team continue to assess barriers to discharge/monitor patient progress toward functional and medical goals. FIM: Function - Bathing Bathing activity did not occur: Refused Position: Shower Body parts bathed by patient: Right arm, Left arm, Chest, Abdomen, Front perineal area, Right upper leg, Left upper leg, Buttocks, Right lower leg, Left lower leg, Back Body parts bathed by helper: Buttocks, Left lower leg, Back, Right lower leg Assist Level: Supervision or verbal cues  Function- Upper Body Dressing/Undressing Upper body dressing/undressing activity did not occur: Refused What is the patient wearing?: Pull over shirt/dress Pull over shirt/dress - Perfomed by patient: Thread/unthread right  sleeve, Thread/unthread left sleeve, Put head through opening, Pull shirt over trunk Assist Level: Set up Set up : To obtain clothing/put away Function - Lower Body Dressing/Undressing Lower body dressing/undressing activity did not occur: Refused What is the patient wearing?: Shoes Position: Sitting EOB Underwear - Performed by patient: Thread/unthread right underwear leg, Thread/unthread left underwear leg, Pull underwear up/down Underwear - Performed  by helper: Pull underwear up/down Pants- Performed by patient: Thread/unthread right pants leg, Thread/unthread left pants leg, Pull pants up/down Pants- Performed by helper: Pull pants up/down Non-skid slipper socks- Performed by helper: Don/doff right sock, Don/doff left sock Socks - Performed by patient: Don/doff right sock, Don/doff left sock Shoes - Performed by patient: Don/doff right shoe, Don/doff left shoe, Fasten right, Fasten left Assist for footwear: Setup Assist for lower body dressing: Set up Set up : To obtain clothing/put away  Function - Toileting Toileting activity did not occur: No continent bowel/bladder event Toileting steps completed by patient: Adjust clothing prior to toileting, Performs perineal hygiene, Adjust clothing after toileting Toileting steps completed by helper: Adjust clothing prior to toileting [place]: Grab bar or rail Assist level: Supervision or verbal cues  Function - Toilet Transfers Toilet transfer assistive device: Grab bar Mechanical lift: Stedy Assist level to toilet: No Help, no cues, assistive device, takes more than a reasonable amount of time Assist level from toilet: No Help, no cues, assistive device, takes more than a reasonable amount of time  Function - Chair/bed transfer Chair/bed transfer method: Stand pivot, Ambulatory Chair/bed transfer assist level: Supervision or verbal cues Chair/bed transfer details: Verbal cues for precautions/safety  Function - Locomotion: Wheelchair Will patient use wheelchair at discharge?: No Type: Manual Max wheelchair distance: 150 Assist Level: Supervision or verbal cues Assist Level: Supervision or verbal cues Assist Level: Supervision or verbal cues Function - Locomotion: Ambulation Assistive device: No device Max distance: 250 Assist level: Supervision or verbal cues Assist level: Supervision or verbal cues Assist level: Supervision or verbal cues Assist level:  Supervision or verbal cues Assist level: Supervision or verbal cues  Function - Comprehension Comprehension: Auditory Comprehension assist level: Follows complex conversation/direction with no assist  Function - Expression Expression: Verbal Expression assist level: Expresses complex ideas: With no assist  Function - Social Interaction Social Interaction assist level: Interacts appropriately 90% of the time - Needs monitoring or encouragement for participation or interaction.  Function - Problem Solving Problem solving assist level: Solves complex 90% of the time/cues < 10% of the time  Function - Memory Memory assist level: Recognizes or recalls 75 - 89% of the time/requires cueing 10 - 24% of the time Patient normally able to recall (first 3 days only): Current season, Location of own room, That he or she is in a hospital  Medical Problem List and Plan: 1.  Left hemiparesis and cognitive/visual deficits secondary to right ponto-medullary infarct, the patient has decreased conjugate gaze, paramedian pontine reticular formation as well as right cranial nerve VI involvement due to his stroke, expect D/C 1/17                   2.  DVT Prophylaxis/Anticoagulation: Pharmaceutical: Lovenox- no clinical sign DVT 3. Pain Management: N/A 4. Mood: LCSW to follow for evaluatin and support.   5. Neuropsych: This patient is capable of making decisions on his own behalf. 6. Skin/Wound Care: routine pressure relief measures. Patient able to reposition himself in bed without difficulty.   7. Fluids/Electrolytes/Nutrition: Monitor I/O.creatinine borderline high otherwise normal  8. HTN: Monitor BP. Allow for higher blood pressure to allow adequate perfusion. Lisinopril and amlodipine on hold at at this time.    Blood pressure 109/64 this AM 9. Dyslipidemia: continue Zocor.   10. Polysubstance abuse: On CIWA protocol. No signs of withdrawal reported/noted.  11.  Urinary frequency   Urine culture with  multiple organisms, will resend today.  May need an anticholinergic agent 12. Constipation:  Cont  Miralax  and schedule Senna S 2 BID.     BM 1/13   LOS (Days) 9 A FACE TO FACE EVALUATION WAS PERFORMED  Ankit Lorie Phenix 05/20/2015, 7:50 AM

## 2015-05-20 NOTE — Progress Notes (Signed)
Physical Therapy Session Note  Patient Details  Name: Andre [name] MRN: [id number] Date of Birth: 01-Nov-1950  Today's Date: 05/20/2015 PT Individual Time: 1430-1530 PT Individual Time Calculation (min): 60 min   Short Term Goals: Week 2:  PT Short Term Goal 1 (Week 2): = LTG due to estimated length of stay  Skilled Therapeutic Interventions/Progress Updates:    Pt received seated in bed, no c/o pain and agreeable to treatment. Requests to use restroom; ambulates into/out of bathroom with S, and performs clothing management and hygiene with distant S. Gait training outdoors on level, unlevel surfaces including grass, gravel, stairs 2 x 12 6-inch height. Has one major LOB during descent of stairs requiring modA to recover balance with RUE on handrail. Educated pt regarding safety on stairs due to vision impairments. Pt reports frequently crossing the street when he leaves his apartment to go to the grocery store. Crossed street x2 trials with pt cued for visual scanning of environment before crossing, attention to curbs for balance and close S overall. Discussed with pt plan to discharge at mostly mod I level, however still recommending S for community mobility and stairs due to visual impairments, and pt in agreement. Kinetron 3 x 2 min for NMR, stance control and endurance. Extended seated rest breaks due to fatigue after each trial. Toe taps, step ups and knee ups on 3" step with cues for decreased reliance on parallel bars. Initially required modA for dynamic balance on knee ups, however reduced to CGA after several repetitions. Returned to room with gait x150' S. Remained seated in recliner with all needs in reach at completion of session.   Therapy Documentation Precautions:  Precautions Precautions: Fall Restrictions Weight Bearing Restrictions: No Pain: Pain Assessment Pain Assessment: No/denies pain Pain Score: 0-No pain   See Function Navigator for Current Functional  Status.   Therapy/Group: Individual Therapy  Luberta Mutter 05/20/2015, 3:27 PM

## 2015-05-21 ENCOUNTER — Inpatient Hospital Stay (HOSPITAL_COMMUNITY): Payer: Medicare Other

## 2015-05-21 ENCOUNTER — Inpatient Hospital Stay (HOSPITAL_COMMUNITY): Payer: Medicare Other | Admitting: Physical Therapy

## 2015-05-21 ENCOUNTER — Inpatient Hospital Stay (HOSPITAL_COMMUNITY): Payer: Medicaid Other | Admitting: Occupational Therapy

## 2015-05-21 LAB — URINE CULTURE

## 2015-05-21 NOTE — Progress Notes (Signed)
Occupational Therapy Session Note  Patient Details  Name: ALLEY GORE MRN: [id number] Date of Birth: 1951-03-13  Today's Date: 05/21/2015 OT Individual Time: 1000-1100 OT Individual Time Calculation (min): 60 min    Short Term Goals: Week 2:  OT Short Term Goal 1 (Week 2): STG = LTG due to remaining LOS  Skilled Therapeutic Interventions/Progress Updates:    Pt resting in bed upon arrival.  Pt agreeable to therapy but declined shower this morning.  Pt stated he was awaiting his "lady friend" to bring him some clean undershorts in the early afternoon.  Pt amb without AD to ADL apartment and practiced stepping into tub and sitting on tub seat.  Pt used grab bar, stating his tub at home had one grab bar. Pt also engaged in picking up items from floor and taking them into gym to place in dirty linen bag to simulate laundry tasks at home.  Pt also engaged in simple home mgmt tasks.  Pt completed all tasks at supervision level.  Pt returned to room and requested to use toilet (transfer and toileting tasks at mod I). Pt returned to recliner with all needs within reach.  Therapy Documentation Precautions:  Precautions Precautions: Fall Restrictions Weight Bearing Restrictions: No Pain:  Pt denied pain  See Function Navigator for Current Functional Status.   Therapy/Group: Individual Therapy  Leroy Libman 05/21/2015, 11:23 AM

## 2015-05-21 NOTE — Progress Notes (Signed)
Subjective/Complaints: Patient feels like his vision is a little bit better  Review of systems: + Constipation. Denies dizziness, blurred vision, diplopia, CP, SOB, N/V/D  Objective: Vital Signs: Blood pressure 114/67, pulse 84, temperature 98.2 F (36.8 C), temperature source Oral, resp. rate 18, height 5\' 6"  (1.676 m), weight 76.023 kg (167 lb 9.6 oz), SpO2 99 %. No results found. No results found for this or any previous visit (from the past 72 hour(s)).  Gen NAD. Vital signs reviewed HEENT: eye patch Cardio: RRR and no murmur Resp: CTA B/L and unlabored GI: BS positive and NT, ND Musc/Skel:  No tenderness. No Edema Neuro: Alert/Oriented Motor 4/5 LLE 5/5 RUE, 4+ in BUE Skin:   Intact. Warm and dry  Assessment/Plan: 1. 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 Physiatrist is providing close team supervision and 24 hour management of active medical problems listed below. Physiatrist and rehab team continue to assess barriers to discharge/monitor patient progress toward functional and medical goals. FIM: Function - Bathing Bathing activity did not occur: Refused Position: Shower Body parts bathed by patient: Right arm, Left arm, Chest, Abdomen, Front perineal area, Right upper leg, Left upper leg, Buttocks, Right lower leg, Left lower leg, Back Body parts bathed by helper: Buttocks, Left lower leg, Back, Right lower leg Assist Level: Supervision or verbal cues  Function- Upper Body Dressing/Undressing Upper body dressing/undressing activity did not occur: Refused What is the patient wearing?: Pull over shirt/dress Pull over shirt/dress - Perfomed by patient: Thread/unthread right sleeve, Thread/unthread left sleeve, Put head through opening, Pull shirt over trunk Assist Level: Set  up Set up : To obtain clothing/put away Function - Lower Body Dressing/Undressing Lower body dressing/undressing activity did not occur: Refused What is the patient wearing?: Shoes Position: Sitting EOB Underwear - Performed by patient: Thread/unthread right underwear leg, Thread/unthread left underwear leg, Pull underwear up/down Underwear - Performed by helper: Pull underwear up/down Pants- Performed by patient: Thread/unthread right pants leg, Thread/unthread left pants leg, Pull pants up/down Pants- Performed by helper: Pull pants up/down Non-skid slipper socks- Performed by helper: Don/doff right sock, Don/doff left sock Socks - Performed by patient: Don/doff right sock, Don/doff left sock Shoes - Performed by patient: Don/doff right shoe, Don/doff left shoe, Fasten right, Fasten left Assist for footwear: Setup Assist for lower body dressing: Set up Set up : To obtain clothing/put away  Function - Toileting Toileting activity did not occur: No continent bowel/bladder event Toileting steps completed by patient: Adjust clothing prior to toileting, Performs perineal hygiene, Adjust clothing after toileting Toileting steps completed by helper: Adjust clothing prior to toileting [place]: Grab bar or rail Assist level: Supervision or verbal cues  Function - Toilet Transfers Toilet transfer assistive device: Grab bar Mechanical lift: Stedy Assist level to toilet: No Help, No cues Assist level from toilet: No Help, No cues  Function - Chair/bed transfer Chair/bed transfer method: Stand pivot, Ambulatory Chair/bed transfer assist level: Supervision or verbal cues Chair/bed transfer details: Verbal cues for precautions/safety  Function - Locomotion: Wheelchair Will patient use wheelchair at discharge?: No Type: Manual Max wheelchair distance: 150 Assist Level: Supervision or verbal cues Assist Level: Supervision or verbal cues Assist Level: Supervision or  verbal cues Function - Locomotion: Ambulation Assistive device: No device Max distance: 250 Assist level: Supervision or verbal cues Assist level: Supervision or verbal  cues Assist level: Supervision or verbal cues Assist level: Supervision or verbal cues Assist level: Supervision or verbal cues  Function - Comprehension Comprehension: Auditory Comprehension assist level: Follows complex conversation/direction with no assist  Function - Expression Expression: Verbal Expression assist level: Expresses complex ideas: With no assist  Function - Social Interaction Social Interaction assist level: Interacts appropriately 90% of the time - Needs monitoring or encouragement for participation or interaction.  Function - Problem Solving Problem solving assist level: Solves basic 90% of the time/requires cueing < 10% of the time  Function - Memory Memory assist level: Recognizes or recalls 75 - 89% of the time/requires cueing 10 - 24% of the time Patient normally able to recall (first 3 days only): Current season, Location of own room, That he or she is in a hospital  Medical Problem List and Plan: 1.  Left hemiparesis and cognitive/visual deficits secondary to right ponto-medullary infarct, the patient has decreased conjugate gaze, paramedian pontine reticular formation as well as right cranial nerve VI involvement due to his stroke, expect D/C tomorrow                 2.  DVT Prophylaxis/Anticoagulation: Pharmaceutical: Lovenox- no clinical sign DVT 3. Pain Management: N/A 4. Mood: LCSW to follow for evaluatin and support.   5. Neuropsych: This patient is capable of making decisions on his own behalf. 6. Skin/Wound Care: routine pressure relief measures. Patient able to reposition himself in bed without difficulty.   7. Fluids/Electrolytes/Nutrition: Monitor I/O.creatinine borderline high otherwise normal 8. HTN: Monitor BP. Allow for higher blood pressure to allow adequate perfusion.  Lisinopril and amlodipine on hold at at this time.    Blood pressure 114/67 this AM 9. Dyslipidemia: continue Zocor.   10. Polysubstance abuse: On CIWA protocol. No signs of withdrawal reported/noted.  11.  Urinary frequency   Repeat urine shows positive WBC rare bacteria   12. Constipation:  Cont  Miralax  and schedule Senna S 2 BID.        LOS (Days) 10 A FACE TO FACE EVALUATION WAS PERFORMED  Lyvia Mondesir E 05/21/2015, 8:09 AM

## 2015-05-21 NOTE — Plan of Care (Signed)
Problem: RH Balance Goal: LTG Patient will maintain dynamic standing with ADLs (OT) LTG: Patient will maintain dynamic standing balance with assist during activities of daily living (OT)  Outcome: Not Met (add Reason) Still needs supervision for safety.     

## 2015-05-21 NOTE — Progress Notes (Signed)
Physical Therapy Discharge Summary  Patient Details  Name: Andre [name] MRN: [id number] Date of Birth: 05-22-1950  Today's Date: 05/21/2015 PT Individual Time: 1100-1200 PT Individual Time Calculation (min): 60 min    Patient has met 7 of 7 long term goals due to improved activity tolerance, improved balance, improved postural control, increased strength, ability to compensate for deficits, functional use of  left upper extremity and left lower extremity, improved awareness and improved coordination.  Patient to discharge at an ambulatory level Modified Independent.   Patient's care partner is independent to provide the necessary physical supervision at discharge.  Reasons goals not met: All goals met  Recommendation:  Patient will benefit from ongoing skilled PT services in outpatient setting to continue to advance safe functional mobility, address ongoing impairments in balance, strength, coordination, activity tolerance, visual impairments, and minimize fall risk.  Equipment: No equipment provided  Reasons for discharge: treatment goals met and discharge from hospital  Patient/family agrees with progress made and goals achieved: Yes  PT Discharge Precautions/Restrictions Precautions Precautions: Fall Restrictions Weight Bearing Restrictions: No Pain Pain Assessment Pain Assessment: No/denies pain Pain Score: 0-No pain Vision/Perception  Vision - Assessment Eye Alignment: Impaired (comment) Ocular Range of Motion: Other (comment);Restricted on the right;Restricted looking up;Restricted looking down Alignment/Gaze Preference: Gaze left Tracking/Visual Pursuits: Left eye does not track medially;Requires cues, head turns, or add eye shifts to track;Decreased smoothness of horizontal tracking;Decreased smoothness of vertical tracking;Decreased smoothness of eye movement to RIGHT superior field;Decreased smoothness of eye movement to RIGHT inferior field Saccades: Impaired -  to be further tested in functional context;Additional head turns occurred during testing Convergence: Impaired (comment) Diplopia Assessment: Disappears with one eye closed  Cognition Overall Cognitive Status: Within Functional Limits for tasks assessed Arousal/Alertness: Awake/alert Orientation Level: Oriented X4 Attention: Selective Sustained Attention: Appears intact Selective Attention: Appears intact Awareness: Appears intact Problem Solving: Appears intact Safety/Judgment: Appears intact Sensation Sensation Light Touch: Impaired Detail Light Touch Impaired Details: Impaired LLE Stereognosis: Not tested Hot/Cold: Not tested Proprioception: Impaired Detail Proprioception Impaired Details: Impaired LLE Coordination Gross Motor Movements are Fluid and Coordinated: No Fine Motor Movements are Fluid and Coordinated: No Coordination and Movement Description: mild ataxia, incoordinaiton Heel Shin Test: impaired LLE Motor  Motor Motor: Ataxia;Abnormal postural alignment and control Motor - Discharge Observations: lateral weight shift during gait, head turn to L side requires cues for compensation, decreased power production and strength for righting reactions, stepping strategies  Mobility Bed Mobility Bed Mobility: Supine to Sit;Sit to Supine;Rolling Left;Rolling Right Rolling Right: 6: Modified independent (Device/Increase time) Rolling Left: 6: Modified independent (Device/Increase time) Supine to Sit: 6: Modified independent (Device/Increase time) Sit to Supine: 6: Modified independent (Device/Increase time) Transfers Transfers: Yes Sit to Stand: 6: Modified independent (Device/Increase time) Stand to Sit: 6: Modified independent (Device/Increase time) Stand Pivot Transfers: 6: Modified independent (Device/Increase time) Locomotion  Ambulation Ambulation: Yes Ambulation/Gait Assistance: 6: Modified independent (Device/Increase time) Ambulation Distance (Feet): 200  Feet Gait Gait: Yes Gait Pattern: Impaired Gait Pattern: Ataxic;Lateral trunk lean to right;Lateral trunk lean to left;Trendelenburg Gait velocity: 3.42 ft/sec High Level Ambulation High Level Ambulation: Head turns;Direction changes Direction Changes: mild LOB with S Head Turns: mild LOB, decreased velocity with S Stairs / Additional Locomotion Stairs: Yes Stairs Assistance: 5: Supervision Stairs Assistance Details: Verbal cues for precautions/safety Stair Management Technique: Alternating pattern;Forwards;One rail Right Number of Stairs: 12 Height of Stairs: 6 Ramp: 5: Supervision Curb: 5: Supervision Wheelchair Mobility Wheelchair Mobility: No (pt ambulatory)  Trunk/Postural Assessment  Cervical  Assessment Cervical Assessment: Within Functional Limits Thoracic Assessment Thoracic Assessment: Within Functional Limits Lumbar Assessment Lumbar Assessment: Within Functional Limits Postural Control Postural Control: Deficits on evaluation (decreaed power production and righting reactions for balance strategies)  Balance Standardized Balance Assessment Standardized Balance Assessment: Berg Balance Test;Timed Up and Go Test Berg Balance Test Sit to Stand: Able to stand without using hands and stabilize independently Standing Unsupported: Able to stand safely 2 minutes Sitting with Back Unsupported but Feet Supported on Floor or Stool: Able to sit safely and securely 2 minutes Stand to Sit: Controls descent by using hands Transfers: Able to transfer safely, minor use of hands Standing Unsupported with Eyes Closed: Able to stand 10 seconds with supervision Standing Ubsupported with Feet Together: Able to place feet together independently and stand for 1 minute with supervision From Standing, Reach Forward with Outstretched Arm: Reaches forward but needs supervision From Standing Position, Pick up Object from Floor: Able to pick up shoe, needs supervision From Standing Position,  Turn to Look Behind Over each Shoulder: Looks behind one side only/other side shows less weight shift Turn 360 Degrees: Able to turn 360 degrees safely but slowly Standing Unsupported, Alternately Place Feet on Step/Stool: Able to complete >2 steps/needs minimal assist Standing Unsupported, One Foot in Front: Able to plae foot ahead of the other independently and hold 30 seconds Standing on One Leg: Tries to lift leg/unable to hold 3 seconds but remains standing independently Total Score: 39 Timed Up and Go Test TUG: Normal TUG;Cognitive TUG Normal TUG (seconds): 12.18 Cognitive TUG (seconds): 16.98 (no numbers recited correctly) Static Sitting Balance Static Sitting - Balance Support: Feet supported Static Sitting - Level of Assistance: 7: Independent Dynamic Sitting Balance Sitting balance - Comments: mod I Static Standing Balance Static Standing - Balance Support: No upper extremity supported;During functional activity Static Standing - Level of Assistance: 6: Modified independent (Device/Increase time) Static Stance: Eyes closed Static Stance: Eyes Closed: 30 sec with S Dynamic Standing Balance Dynamic Standing - Balance Support: During functional activity;No upper extremity supported Dynamic Standing - Level of Assistance: 6: Modified independent (Device/Increase time) Dynamic Standing - Balance Activities: Lateral lean/weight shifting;Forward lean/weight shifting;Reaching for objects;Reaching across midline Extremity Assessment  RUE Assessment RUE Assessment: Within Functional Limits LUE Assessment LUE Assessment: Within Functional Limits RLE Assessment RLE Assessment: Within Functional Limits (4+/5 to 5/5 throughout) LLE Assessment LLE Assessment: Within Functional Limits (4+/5 to 5/5 throughout)  Skilled Therapeutic Intervention: Pt received seated in recliner, no c/o pain and agreeable to treatment. Performed all mobility as described above and in care tool in anticipation  of d/c home with SO providing intermittent assist. Pt performs all mobility at mod I level with the exception of community mobility, stairs, and curbs due to visual impairments. Educated pt in recommendation for supervision in these environments for safety. Performed Nustep x10 min with BUE/BLE on level 6 for strengthening and aerobic endurance. Returned to room with mod I ambulation, bathroom transfer with mod I and then remained seated in recliner at completion of session, all needs within reach.   See Function Navigator for Current Functional Status.  Benjiman Core Tygielski 05/21/2015, 12:07 PM

## 2015-05-21 NOTE — Progress Notes (Signed)
Occupational Therapy Discharge Summary  Patient Details  Name: Andre [name] MRN: [id number] Date of Birth: 08-Aug-1950   Session Note:  Pt worked on ADL session to begin with, performing shower and dressing tasks.  He utilized seat in the shower for bathing and safety.  Overall supervision for all aspects of bathing as well as dressing.  Noted pt with occasional excessive swaying when changing directions quickly.  Pt also completed grooming tasks in standing as well.  Once dressed had pt ambulate down to the [employer].  Had him work on visual scanning an compensation to the right side with use of custom programs.  Pt able to demonstrate 100% with lights flashing at 3 seconds.  Only demonstrates 70-80% accuracy for 2 second flashing lights.  Had pt stand on foam surface to complete as well with increased time with 80% accuracy also.  Next ambulated down to the therapy gym and worked on visual scanning exercises with emphasis on tracking to the right of midline.  Integrated pipe tree puzzle with emphasis on pt visually scanning to the right to look at diagram of puzzle he needed to reproduce.  Pt with increased difficulty with visual acuity unless he places the diagram in the right visual field or turns his head far enough to the right to use to still have the object in the left visual field.   Patient has met 9 of 10 long term goals due to improved balance, postural control, functional use of  LEFT upper extremity, improved attention and improved coordination.  Patient to discharge at overall Supervision level.  Patient's care partner is independent to provide the necessary physical and cognitive assistance at discharge.    Reasons goals not met: Pt needs supervision for dynamic standing balance with selfcare tasks.   Recommendation:  Patient will benefit from ongoing skilled OT services in home health setting to continue to advance functional skills in the area of iADL.  Feel pt still needs work  on dynamic standing balance with functional tasks as well as vision.  Pt exhibits increased occular ROM bilaterally right of midline, however he is unable to demonstrate full AROM and once crossing midline cannot maintain unless cued to do so.   Equipment: tub seat  Reasons for discharge: treatment goals met and discharge from hospital  Patient/family agrees with progress made and goals achieved: Yes  OT Discharge Precautions/Restrictions  Precautions Precautions: Fall Restrictions Weight Bearing Restrictions: No   Vision/Perception  Vision- History Patient Visual Report: Blurring of vision Vision- Assessment Vision Assessment?: Vision impaired- to be further tested in functional context Eye Alignment: Impaired (comment) Ocular Range of Motion: Other (comment);Restricted on the right;Restricted looking up;Restricted looking down Alignment/Gaze Preference: Gaze left Tracking/Visual Pursuits: Left eye does not track medially;Requires cues, head turns, or add eye shifts to track;Decreased smoothness of horizontal tracking;Decreased smoothness of vertical tracking;Decreased smoothness of eye movement to RIGHT superior field;Decreased smoothness of eye movement to RIGHT inferior field Saccades: Impaired - to be further tested in functional context;Additional head turns occurred during testing Convergence: Impaired (comment) Visual Fields: Left visual field deficit;Right visual field deficit;Impaired-to be further tested in functional context;Left superior homonymous quadranopsia Diplopia Assessment: Disappears with one eye closed Depth Perception: Undershoots  Cognition Overall Cognitive Status: Within Functional Limits for tasks assessed Arousal/Alertness: Awake/alert Orientation Level: Oriented X4 Attention: Selective Sustained Attention: Appears intact Selective Attention: Appears intact Awareness: Appears intact Problem Solving: Appears intact Safety/Judgment: Appears  intact Sensation Sensation Light Touch: Impaired Detail Light Touch Impaired Details: Impaired LLE  Stereognosis: Not tested Hot/Cold: Not tested Proprioception: Impaired Detail Proprioception Impaired Details: Impaired LLE Coordination Gross Motor Movements are Fluid and Coordinated: No Fine Motor Movements are Fluid and Coordinated: No Coordination and Movement Description: mild ataxia, incoordinaiton Heel Shin Test: impaired LLE Motor  Motor Motor: Ataxia;Abnormal postural alignment and control Motor - Discharge Observations: lateral weight shift during gait, head turn to L side requires cues for compensation, decreased power production and strength for righting reactions, stepping strategies Mobility  Bed Mobility Bed Mobility: Supine to Sit;Sit to Supine;Rolling Left;Rolling Right Rolling Right: 6: Modified independent (Device/Increase time) Rolling Left: 6: Modified independent (Device/Increase time) Supine to Sit: 6: Modified independent (Device/Increase time) Sit to Supine: 6: Modified independent (Device/Increase time) Transfers Sit to Stand: 6: Modified independent (Device/Increase time) Stand to Sit: 6: Modified independent (Device/Increase time)  Trunk/Postural Assessment  Cervical Assessment Cervical Assessment: Within Functional Limits Thoracic Assessment Thoracic Assessment: Within Functional Limits Lumbar Assessment Lumbar Assessment: Within Functional Limits  Balance Standardized Balance Assessment Standardized Balance Assessment: Berg Balance Test;Timed Up and Go Test Berg Balance Test Sit to Stand: Able to stand without using hands and stabilize independently Standing Unsupported: Able to stand safely 2 minutes Sitting with Back Unsupported but Feet Supported on Floor or Stool: Able to sit safely and securely 2 minutes Stand to Sit: Controls descent by using hands Transfers: Able to transfer safely, minor use of hands Standing Unsupported with Eyes  Closed: Able to stand 10 seconds with supervision Standing Ubsupported with Feet Together: Able to place feet together independently and stand for 1 minute with supervision From Standing, Reach Forward with Outstretched Arm: Reaches forward but needs supervision From Standing Position, Pick up Object from Floor: Able to pick up shoe, needs supervision From Standing Position, Turn to Look Behind Over each Shoulder: Looks behind one side only/other side shows less weight shift Turn 360 Degrees: Able to turn 360 degrees safely but slowly Standing Unsupported, Alternately Place Feet on Step/Stool: Able to complete >2 steps/needs minimal assist Standing Unsupported, One Foot in Front: Able to plae foot ahead of the other independently and hold 30 seconds Standing on One Leg: Tries to lift leg/unable to hold 3 seconds but remains standing independently Total Score: 39 Timed Up and Go Test TUG: Normal TUG;Cognitive TUG Normal TUG (seconds): 12.18 Cognitive TUG (seconds): 16.98 (no numbers recited correctly) Static Sitting Balance Static Sitting - Balance Support: Feet supported Static Sitting - Level of Assistance: 7: Independent Dynamic Sitting Balance Sitting balance - Comments: mod I Static Standing Balance Static Standing - Balance Support: No upper extremity supported;During functional activity Static Standing - Level of Assistance: 6: Modified independent (Device/Increase time) Static Stance: Eyes closed Static Stance: Eyes Closed: 30 sec with S Dynamic Standing Balance Dynamic Standing - Balance Support: During functional activity;No upper extremity supported Dynamic Standing - Level of Assistance: 6: Modified independent (Device/Increase time) Dynamic Standing - Balance Activities: Lateral lean/weight shifting;Forward lean/weight shifting;Reaching for objects;Reaching across midline Extremity/Trunk Assessment RUE Assessment RUE Assessment: Within Functional Limits LUE Assessment LUE  Assessment: Within Functional Limits   See Function Navigator for Current Functional Status.  Leotis Shames [facility] 05/21/2015, 11:45 AM   Clyda Greener, OTR/L 05/21/2015

## 2015-05-21 NOTE — Progress Notes (Signed)
Social Work Patient ID: Andre [name], male   DOB: 07/25/1950, 65 y.o.   MRN: [id number] Met with pt to discuss discharge needs, team would like for him to get OP therapies but not on the bus line and would need to walk to far to get too. So have made referral to [facility] for follow up PT and OT. Also for a tub seat to be delivered to his room prior to discharge. Trying to figure out a way home for pt he doesn't not know anyone to take him home tomorrow and MD doesn't not want pt to take the bus home.

## 2015-05-22 ENCOUNTER — Inpatient Hospital Stay (HOSPITAL_COMMUNITY): Payer: Medicaid Other | Admitting: Physical Therapy

## 2015-05-22 ENCOUNTER — Inpatient Hospital Stay (HOSPITAL_COMMUNITY): Payer: Medicaid Other | Admitting: Occupational Therapy

## 2015-05-22 MED ORDER — ASPIRIN 81 MG PO TBEC: 81.0000 mg | DELAYED_RELEASE_TABLET | Freq: Every day | ORAL | Status: DC

## 2015-05-22 MED ORDER — POLYETHYLENE GLYCOL 3350 17 G PO PACK: 17.0000 g | PACK | Freq: Every day | ORAL | Status: DC

## 2015-05-22 MED ORDER — FOLIC ACID 1 MG PO TABS: 1.0000 mg | ORAL_TABLET | Freq: Every day | ORAL | Status: DC

## 2015-05-22 MED ORDER — SENNOSIDES-DOCUSATE SODIUM 8.6-50 MG PO TABS: 2.0000 | ORAL_TABLET | Freq: Two times a day (BID) | ORAL | Status: DC

## 2015-05-22 MED ORDER — THIAMINE HCL 100 MG PO TABS: 100.0000 mg | ORAL_TABLET | Freq: Every day | ORAL | Status: DC

## 2015-05-22 MED ORDER — CLOPIDOGREL BISULFATE 75 MG PO TABS: 75.0000 mg | ORAL_TABLET | Freq: Every day | ORAL | Status: DC

## 2015-05-22 MED ORDER — FLUTICASONE PROPIONATE 50 MCG/ACT NA SUSP: 2.0000 | Freq: Every day | NASAL | Status: DC | PRN

## 2015-05-22 NOTE — Discharge Summary (Signed)
Physician Discharge Summary  Patient ID: MELVERN FAUBION MRN: [id number] DOB/AGE: 1950/08/18 65 y.o.  Admit date: 05/11/2015 Discharge date: 05/22/2015  Discharge Diagnoses:  Principal Problem:   Cerebral infarction due to thrombosis of right posterior cerebral artery [facility]) Active Problems:   Essential hypertension   Left hemiparesis (HCC)   Visual disturbance as complication of stroke   Urinary frequency   Discharged Condition:  Stable    Labs:  Basic Metabolic Panel: BMP Latest Ref Rng 05/18/2015 05/12/2015 05/09/2015  Glucose 65 - 99 mg/dL - 80 70  BUN 6 - 20 mg/dL - 12 10  Creatinine 0.61 - 1.24 mg/dL 1.38(H) 1.36(H) 1.14  Sodium 135 - 145 mmol/L - 140 140  Potassium 3.5 - 5.1 mmol/L - 3.7 3.9  Chloride 101 - 111 mmol/L - 105 105  CO2 22 - 32 mmol/L - 25 25  Calcium 8.9 - 10.3 mg/dL - 9.3 8.9     CBC: CBC Latest Ref Rng 05/12/2015 05/09/2015 05/08/2015  WBC 4.0 - 10.5 K/uL 6.6 6.9 -  Hemoglobin 13.0 - 17.0 g/dL 13.9 13.1 16.3  Hematocrit 39.0 - 52.0 % 43.4 40.8 48.0  Platelets 150 - 400 K/uL 227 203 -     CBG: No results for input(s): GLUCAP in the last 168 hours.  Brief HPI:   Andre [name] is a 65 y.o. male with history of CVA with mild LLE weakness, polysubstance abuse who was admitted on 05/08/15 with left sided weakness and left facial droop. Patient had been out drinking with friends the night before and woke up the next day with worsening of left sided weakness. UDS positive for cocaine. CT head negative and CTA head/neck showed 60% stenosis proximal R-ICA and 50% stenosis L-ICA, moderate stenosis distal B-Va and mid basilar artery. MRI brain done revealing acute non-hemorrhagic infarct in right pons and medulla.  [name] consulted and felt that stroke due to diffuse intracranial stenosis and recommended ASA/plavix for 3 months followed by Plavix alone. Patient with resultant LLE instability, ataxic mixed dysarthria, mild oral dysphagia, sensory deficits, visual  deficits with diplopia, and dizziness affecting ability to carry out self care tasks as well as mobility. CIR was recommended by MD and rehab team for follow up therapy.    Hospital Course: RAINE ALWARDT was admitted to rehab 05/11/2015 for inpatient therapies to consist of PT and OT at least three hours five days a week. Past admission physiatrist, therapy team and rehab RN have worked together to provide customized collaborative inpatient rehab. He was maintained on ASA and Plavix for secondary stroke prevention and is tolerating this without difficulty. Patching was used to help manage diplopia and he was reporting improvement in vision at discharge. Blood pressures were monitored on bid basis and have been controlled off medications. Po intake has been good and he is continent of bowel and bladder. Miralax and Senna was added to help with recurrent constipation. he has had issues with urgency and urine culture X 2 were ordered for work up and has been negative for infection.  Urgency has resolved with scheduled toileting. He was educated on alcohol and cocaine cessation. No signs of withdrawal seen during this stay. He has made good progress and is modified independent to supervision at discharge.  He continues to have issues with visual acuity and supervision is recommended for safety at discharge.  He will continue to receive follow up HHPT and [place] [facility] [place] after discharge.    Rehab course: During patient's stay  in rehab weekly team conferences were held to monitor patient's progress, set goals and discuss barriers to discharge. At admission, patient required moderate assistance with ADL tasks and mobility. He had mild oral dysphagia with mild mixed dysarthria. He has had improvement in activity tolerance, balance, postural control, as well as ability to compensate for deficits. He is has had improvement in functional use LUE and LLE as well as improved awareness.  He is tolerating  regular diet and utilizing universal swallow strategies independently. Speech quality has returned to baseline and speech therapy signed off on 01/10. He is able to complete simple home management tasks with supervision. He is modified independent for transfers and is ambulating 200 feet with ataxic gait and increased time. Family education was done with roommate regarding need for supervision after discharge.    Disposition: Home   Diet: Low fat. Low cholesterol.   Special Instructions: 1. Do not use blood pressure medications at this time. 2. Avoid alcohol or tobacco products.    Discharge Instructions    Ambulatory referral to Physical Medicine Rehab    Complete by:  As directed   4 week follow up after stroke            Medication List    STOP taking these medications        amLODipine 10 MG tablet  Commonly known as:  NORVASC     aspirin 325 MG tablet  Replaced by:  aspirin 81 MG EC tablet     lisinopril 10 MG tablet  Commonly known as:  PRINIVIL,ZESTRIL     metoprolol 100 MG tablet  Commonly known as:  LOPRESSOR      TAKE these medications        albuterol 108 (90 Base) MCG/ACT inhaler  Commonly known as:  PROVENTIL HFA;VENTOLIN HFA  Inhale 2 puffs into the lungs every 6 (six) hours as needed for wheezing (cough).     aspirin 81 MG EC tablet  Take 1 tablet (81 mg total) by mouth daily.     clopidogrel 75 MG tablet  Commonly known as:  PLAVIX  Take 1 tablet (75 mg total) by mouth daily.     fluticasone 50 MCG/ACT nasal spray  Commonly known as:  FLONASE  Place 2 sprays into both nostrils daily as needed for allergies or rhinitis.     folic acid 1 MG tablet  Commonly known as:  FOLVITE  Take 1 tablet (1 mg total) by mouth daily.     guaiFENesin-codeine 100-10 MG/5ML syrup  Commonly known as:  ROBITUSSIN AC  Take 5 mLs by mouth 3 (three) times daily as needed for cough.     multivitamins ther. w/minerals Tabs tablet  Take 1 tablet by mouth daily.      polyethylene glycol packet  Commonly known as:  MIRALAX / GLYCOLAX  Take 17 g by mouth daily.     senna-docusate 8.6-50 MG tablet  Commonly known as:  Senokot-S  Take 2 tablets by mouth 2 (two) times daily.     simvastatin 20 MG tablet  Commonly known as:  ZOCOR  Take 20 mg by mouth daily.     thiamine 100 MG tablet  Take 1 tablet (100 mg total) by mouth daily.           Follow-up Information    Follow up with Charlett Blake, MD.   Specialty:  Physical Medicine and Rehabilitation   Why:  office will call you with follow up appointment   Contact information:  [address] [place] [address] [phone]       Follow up with Xu,Jindong, MD. Call today.   Specialty:  Neurology   Why:  for follow up appointment   Contact information:   [address] [place] [address] [phone]       Follow up with Jacques Earthly, MD On 05/29/2015.   Specialty:  Internal Medicine   Why:  APPT @ 3:15 PM   Contact information:   [place] [id number] [phone]       Signed: Bary Leriche 05/22/2015, 10:05 AM

## 2015-05-22 NOTE — Progress Notes (Signed)
Recreational Therapy Discharge Summary Patient Details  Name: ERMIN PARISIEN MRN: [id number] Date of Birth: 01-07-1951 Today's Date: 05/22/2015  Long term goals set: 1  Long term goals met: 1  Comments on progress toward goals: Pt has made good progress during LOS and is ready for discharge home with roommate at supervision level for TR tasks.  Pt requires extra time and occasional cuing due to visual deficits.  Pt is anxious to return home today.  Reasons for discharge: discharge from hospital  Patient/family agrees with progress made and goals achieved: Yes  Aitana Burry 05/22/2015, 8:20 AM

## 2015-05-22 NOTE — Progress Notes (Signed)
Social Work  Discharge Note  The overall goal for the admission was met for:   Discharge location: Yes-HOME WITH GIRLFRIEND-CLEVE WHO CAN PROVIDE SUPERVISION  Length of Stay: Yes-11 DAYS  Discharge activity level: Yes-SUPERVISION/MOD/I LEVEL  Home/community participation: Yes  Services provided included: MD, RD, PT, OT, SLP, RN, CM, TR, Pharmacy and SW  Financial Services: Medicaid  Follow-up services arranged: Home Health: [facility] and Patient/Family has no preference for HH/DME agencies  Comments (or additional information):CLEVE HAS BEEN HERE AND OBSERVED PT IN THERAPIES, SHE CAN PROVIDE SUPERVISION TO HIM. PT DECLINED SUBSTANCE ABUSE RESOURCES FELT HE DOES NOT HAVE AN ISSUE. PT TRANSPORTING HOME VIA [facility] GIRLFRIEND HERE TO ACCOMPANY HIM.  Patient/Family verbalized understanding of follow-up arrangements: Yes  Individual responsible for coordination of the follow-up plan: PATIENT & CLEVE-GIRLFREIND   Confirmed correct DME delivered: Elease Hashimoto 05/22/2015    Elease Hashimoto

## 2015-05-22 NOTE — Progress Notes (Signed)
Subjective/Complaints: No issues overnight patient feels like he is ready to go home  Review of systems: + Constipation. Denies dizziness, blurred vision, diplopia, CP, SOB, N/V/D  Objective: Vital Signs: Blood pressure 108/69, pulse 75, temperature 98 F (36.7 C), temperature source Oral, resp. rate 18, height 5\' 6"  (1.676 m), weight 76.023 kg (167 lb 9.6 oz), SpO2 100 %. No results found. Results for orders placed or performed during the hospital encounter of 05/11/15 (from the past 72 hour(s))  Culture, Urine     Status: None   Collection Time: 05/20/15  9:45 AM  Result Value Ref Range   Specimen Description URINE, CLEAN CATCH    Special Requests NONE    Culture MULTIPLE SPECIES PRESENT, SUGGEST RECOLLECTION    Report Status 05/21/2015 FINAL     Gen NAD. Vital signs reviewed HEENT: eye patch Cardio: RRR and no murmur Resp: CTA B/L and unlabored GI: BS positive and NT, ND Musc/Skel:  No tenderness. No Edema Neuro: Alert/Oriented Motor 4/5 LLE 5/5 RUE, 4+ in BUE Skin:   Intact. Warm and dry  Assessment/Plan: CVA completing inpatient rehabilitation stable for discharge Stable for D/C today F/u PCP in 1-2 weeks, patient's PCP died and will need to find out whether somebody else's taking over the practice F/u PM&R 3 weeks See D/C summary See D/C instructions FIM: Function - Bathing Bathing activity did not occur: Refused Position: Shower Body parts bathed by patient: Right arm, Left arm, Chest, Abdomen, Front perineal area, Right upper leg, Left upper leg, Buttocks, Right lower leg, Left lower leg, Back Body parts bathed by helper: Buttocks, Left lower leg, Back, Right lower leg Assist Level: Supervision or verbal cues  Function- Upper Body Dressing/Undressing Upper body dressing/undressing activity did not occur: Refused What is the patient wearing?: Pull over shirt/dress Pull over shirt/dress - Perfomed by patient: Thread/unthread right sleeve, Thread/unthread left  sleeve, Put head through opening, Pull shirt over trunk Assist Level: Set up Set up : To obtain clothing/put away Function - Lower Body Dressing/Undressing Lower body dressing/undressing activity did not occur: Refused What is the patient wearing?: Pants, Underwear, Socks, Shoes Position: Sitting EOB Underwear - Performed by patient: Thread/unthread right underwear leg, Thread/unthread left underwear leg, Pull underwear up/down Underwear - Performed by helper: Pull underwear up/down Pants- Performed by patient: Thread/unthread right pants leg, Thread/unthread left pants leg, Pull pants up/down Pants- Performed by helper: Pull pants up/down Non-skid slipper socks- Performed by helper: Don/doff right sock, Don/doff left sock Socks - Performed by patient: Don/doff right sock, Don/doff left sock Shoes - Performed by patient: Don/doff right shoe, Don/doff left shoe, Fasten right, Fasten left Assist for footwear: Setup Assist for lower body dressing: Supervision or verbal cues Set up : To obtain clothing/put away  Function - Toileting Toileting activity did not occur: No continent bowel/bladder event Toileting steps completed by patient: Adjust clothing prior to toileting, Performs perineal hygiene, Adjust clothing after toileting Toileting steps completed by helper: Adjust clothing prior to toileting [place]: Grab bar or rail Assist level: No help/no cues  Function - Toilet Transfers Toilet transfer assistive device: Elevated toilet seat/BSC over toilet Mechanical lift: Stedy Assist level to toilet: No Help, no cues, assistive device, takes more than a reasonable amount of time Assist level from toilet: No Help, no cues, assistive device, takes more than a reasonable amount of time  Function - Chair/bed transfer Chair/bed transfer method: Stand pivot, Ambulatory Chair/bed transfer assist level: No Help, no cues, assistive device, takes more than a  reasonable amount of  time Chair/bed transfer assistive device: Armrests Chair/bed transfer details: Verbal cues for precautions/safety  Function - Locomotion: Wheelchair Will patient use wheelchair at discharge?: No Type: Manual Max wheelchair distance: 150 Assist Level: Supervision or verbal cues Assist Level: Supervision or verbal cues Assist Level: Supervision or verbal cues Function - Locomotion: Ambulation Assistive device: No device Max distance: 200 Assist level: No help, No cues, assistive device, takes more than a reasonable amount of time Assist level: No help, No cues, assistive device, takes more than a reasonable amount of time Assist level: No help, No cues, assistive device, takes more than a reasonable amount of time Assist level: No help, No cues, assistive device, takes more than a reasonable amount of time Assist level: Supervision or verbal cues  Function - Comprehension Comprehension: Auditory Comprehension assist level: Follows basic conversation/direction with no assist  Function - Expression Expression: Verbal Expression assist level: Expresses basic needs/ideas: With no assist  Function - Social Interaction Social Interaction assist level: Interacts appropriately 90% of the time - Needs monitoring or encouragement for participation or interaction.  Function - Problem Solving Problem solving assist level: Solves basic 90% of the time/requires cueing < 10% of the time  Function - Memory Memory assist level: Recognizes or recalls 75 - 89% of the time/requires cueing 10 - 24% of the time Patient normally able to recall (first 3 days only): Current season, Location of own room, That he or she is in a hospital, Staff names and faces  Medical Problem List and Plan: 1.  Left hemiparesis and cognitive/visual deficits secondary to right ponto-medullary infarct, the patient has decreased conjugate gaze, paramedian pontine reticular formation as well as right cranial nerve VI  involvement due to his stroke, expect D/C tomorrow                 2.  DVT Prophylaxis/Anticoagulation: Pharmaceutical:discontinue Lovenox 3. Pain Management: N/A 4. Mood: LCSW to follow for evaluatin and support.   5. Neuropsych: This patient is capable of making decisions on his own behalf. 6. Skin/Wound Care: routine pressure relief measures. Patient able to reposition himself in bed without difficulty.   7. Fluids/Electrolytes/Nutrition: Monitor I/O.creatinine borderline high otherwise normal 8. HTN: Monitor BP. Allow for higher blood pressure to allow adequate perfusion. Lisinopril and amlodipine on hold at at this time.    Blood pressure 108/69 this AM 9. Dyslipidemia: continue Zocor.   10. Polysubstance abuse: On CIWA protocol. No signs of withdrawal reported/noted.     LOS (Days) 11 A FACE TO FACE EVALUATION WAS PERFORMED  Rebecca Motta E 05/22/2015, 8:00 AM

## 2015-05-22 NOTE — Progress Notes (Signed)
Recreational Therapy Session Note  Patient Details  Name: Andre [name] MRN: [id number] Date of Birth: Feb 16, 1951 Today's Date: 05/22/2015 LATE ENTRY for 05/21/2015  Pain: no c/o Skilled Therapeutic Interventions/Progress Updates:  Pt participated in simple TR tasks seated/standing with supervision.  Education with pt and roommate about importance of staying active and changing previous lifestyle habits (alcohol consumption).  Both stated understanding.  Therapy/Group: Individual Therapy   [name] 05/22/2015, 8:17 AM

## 2015-05-22 NOTE — Progress Notes (Signed)
Pt. discharged accompanied by GF. Verbalizes understanding of DC instructions given by Algis Liming, PAC. VSS. Denies pain, discomfort.

## 2015-05-22 NOTE — Discharge Instructions (Signed)
Inpatient Rehab Discharge Instructions  Andre [name] Discharge date and time:  05/22/15  Activities/Precautions/ Functional Status:  Activity: no lifting, driving, or strenuous exercise till cleared by MD. Diet: low fat, low cholesterol diet Wound Care: none needed   Functional status:  ___ No restrictions     ___ Walk up steps independently ___ 24/7 supervision/assistance   ___ Walk up steps with assistance _X__ Intermittent supervision/assistance  ___ Bathe/dress independently ___ Walk with walker     ___ Bathe/dress with assistance ___ Walk Independently    ___ Shower independently ___ Walk with assistance    _X__ Shower with supervision _X__ No alcohol     ___ Return to work/school ________  Special Instructions: 1. Do not use blood pressure medications for now.  2. NO alcohol or tobacco.    COMMUNITY REFERRALS UPON DISCHARGE:    Home Health:   [facility]   Date of last service:05/22/2015  Medical Equipment/Items Ordered:TUB SEAT  Agency/Supplier:[facility]  [phone]   GENERAL COMMUNITY RESOURCES FOR PATIENT/FAMILY: Support Groups:[facility] SUPPORT GROUP  SECOND THURSDAY OF EACH MONTH 3;00-4:00 PM QUESTIONS CONTACT [phone]   STROKE/TIA DISCHARGE INSTRUCTIONS SMOKING Cigarette smoking nearly doubles your risk of having a stroke & is the single most alterable risk factor  If you smoke or have smoked in the last 12 months, you are advised to quit smoking for your health.  Most of the excess cardiovascular risk related to smoking disappears within a year of stopping.  Ask you doctor about anti-smoking medications  [place] Quit Line: 1-800-QUIT NOW  Free Smoking Cessation Classes [phone]  CHOLESTEROL Know your levels; limit fat & cholesterol in your diet  Lipid Panel     Component Value Date/Time   CHOL 137 05/09/2015 0546   TRIG 198* 05/09/2015 0546   HDL 45 05/09/2015 0546   CHOLHDL 3.0  05/09/2015 0546   VLDL 40 05/09/2015 0546   LDLCALC 52 05/09/2015 0546      Many patients benefit from treatment even if their cholesterol is at goal.  Goal: Total Cholesterol (CHOL) less than 160  Goal:  Triglycerides (TRIG) less than 150  Goal:  HDL greater than 40  Goal:  LDL (LDLCALC) less than 100   BLOOD PRESSURE American Stroke Association blood pressure target is less that 120/80 mm/Hg  Your discharge blood pressure is:  BP: 108/69 mmHg  Monitor your blood pressure  Limit your salt and alcohol intake  Many individuals will require more than one medication for high blood pressure  DIABETES (A1c is a blood sugar average for last 3 months) Goal HGBA1c is under 7% (HBGA1c is blood sugar average for last 3 months)  Diabetes: No known diagnosis of diabetes    Lab Results  Component Value Date   HGBA1C 5.1 05/09/2015     Your HGBA1c can be lowered with medications, healthy diet, and exercise.  Check your blood sugar as directed by your physician  Call your physician if you experience unexplained or low blood sugars.  PHYSICAL ACTIVITY/REHABILITATION Goal is 30 minutes at least 4 days per week  Activity: No driving, Therapies:  See above Return to work: N/A  Activity decreases your risk of heart attack and stroke and makes your heart stronger.  It helps control your weight and blood pressure; helps you relax and can improve your mood.  Participate in a regular exercise program.  Talk with your doctor about the best form of exercise for you (dancing, walking,  swimming, cycling).  DIET/WEIGHT Goal is to maintain a healthy weight  Your discharge diet is: Diet Heart Room service appropriate?: Yes; Fluid consistency:: Thin  liquids Your height is:  Height: 5\' 6"  (167.6 cm) Your current weight is: Weight: 76.023 kg (167 lb 9.6 oz) Your Body Mass Index (BMI) is:  BMI (Calculated): 27.1  Following the type of diet specifically designed for you will help prevent another  stroke.  Your goal weight is:  155 lbs  Your goal Body Mass Index (BMI) is 19-24.  Healthy food habits can help reduce 3 risk factors for stroke:  High cholesterol, hypertension, and excess weight.  RESOURCES Stroke/Support Group:  Call [phone]   STROKE EDUCATION PROVIDED/REVIEWED AND GIVEN TO PATIENT Stroke warning signs and symptoms How to activate emergency medical system (call 911). Medications prescribed at discharge. Need for follow-up after discharge. Personal risk factors for stroke. Pneumonia vaccine given:  Flu vaccine given:  My questions have been answered, the writing is legible, and I understand these instructions.  I will adhere to these goals & educational materials that have been provided to me after my discharge from the hospital.      My questions have been answered and I understand these instructions. I will adhere to these goals and the provided educational materials after my discharge from the hospital.  Patient/Caregiver Signature _______________________________ Date __________  Clinician Signature _______________________________________ Date __________  Please bring this form and your medication list with you to all your follow-up doctor's appointments.

## 2015-05-29 ENCOUNTER — Ambulatory Visit: Payer: Medicaid Other | Admitting: Pulmonary Disease

## 2015-05-29 ENCOUNTER — Telehealth: Payer: Self-pay | Admitting: Pulmonary Disease

## 2015-05-29 NOTE — Telephone Encounter (Signed)
REMINDER CALL FOR APPT 05/30/15, LMTCB

## 2015-05-30 ENCOUNTER — Encounter: Payer: Self-pay | Admitting: Pulmonary Disease

## 2015-05-30 ENCOUNTER — Ambulatory Visit (INDEPENDENT_AMBULATORY_CARE_PROVIDER_SITE_OTHER): Payer: Medicare Other | Admitting: Pulmonary Disease

## 2015-05-30 VITALS — BP 131/77 | HR 87 | Temp 98.1°F | Ht 67.5 in | Wt 175.2 lb

## 2015-05-30 DIAGNOSIS — I10 Essential (primary) hypertension: Secondary | ICD-10-CM

## 2015-05-30 DIAGNOSIS — Z8673 Personal history of transient ischemic attack (TIA), and cerebral infarction without residual deficits: Secondary | ICD-10-CM

## 2015-05-30 DIAGNOSIS — Z7982 Long term (current) use of aspirin: Secondary | ICD-10-CM

## 2015-05-30 DIAGNOSIS — I639 Cerebral infarction, unspecified: Secondary | ICD-10-CM

## 2015-05-30 NOTE — Progress Notes (Signed)
Subjective:    Patient ID: Andre [name], male    DOB: [birth year], 65 y.o.   MRN: [id number]  HPI [name] is a 65 year old man with history of HTN, polysubstance abuse, CVA presenting for follow up of CVA.  He was hospitalized 05/08/2015 to 05/11/2015 for acute CVA. Found to have right ICA stenosis 60% and left ICA stenosis 50%. He is on ASA and Plavix for 3 months then will continue Plavix alone. He went to inpatient rehab and was discharged on 05/22/2015.  He reports he is doing well overall. His vision and strength are back to baseline. His dysarthria has also resolved. Denies trouble swallowing or new weaknesses. Denies cocaine or alcohol use since discharge.  Review of Systems Constitutional: no fevers/chills Eyes: no vision changes Ears, nose, mouth, throat, and face: no cough Respiratory: no shortness of breath Cardiovascular: no chest pain Gastrointestinal: no nausea/vomiting, no abdominal pain, no constipation, no diarrhea Genitourinary: no dysuria, no hematuria Integument: no rash Hematologic/lymphatic: no edema Musculoskeletal: no arthralgias, no myalgias  Past Medical History  Diagnosis Date  . Hypertension   . Stroke [facility])    Past Surgical History  Procedure Laterality Date  . Abdominal surgery     Family History  Problem Relation Age of Onset  . Hypertension Mother   . Hypertension Father    Social History   Social History  . Marital Status: Widowed    Spouse Name: N/A  . Number of Children: N/A  . Years of Education: N/A   Social History Main Topics  . Smoking status: Current Some Day Smoker  . Smokeless tobacco: None     Comment: 1 CIGARETTE  . Alcohol Use: 0.0 oz/week    0 Standard drinks or equivalent per week  . Drug Use: None  . Sexual Activity: Not Asked   Other Topics Concern  . None   Social History Narrative    Current Outpatient Prescriptions on File Prior to Visit  Medication Sig Dispense Refill  . albuterol  (PROVENTIL HFA;VENTOLIN HFA) 108 (90 Base) MCG/ACT inhaler Inhale 2 puffs into the lungs every 6 (six) hours as needed for wheezing (cough). 1 Inhaler 2  . aspirin EC 81 MG EC tablet Take 1 tablet (81 mg total) by mouth daily. 100 tablet 0  . clopidogrel (PLAVIX) 75 MG tablet Take 1 tablet (75 mg total) by mouth daily. 30 tablet 0  . fluticasone (FLONASE) 50 MCG/ACT nasal spray Place 2 sprays into both nostrils daily as needed for allergies or rhinitis.  2  . folic acid (FOLVITE) 1 MG tablet Take 1 tablet (1 mg total) by mouth daily. 30 tablet 0  . guaiFENesin-codeine (ROBITUSSIN AC) 100-10 MG/5ML syrup Take 5 mLs by mouth 3 (three) times daily as needed for cough. (Patient not taking: Reported on 05/08/2015) 120 mL 0  . Multiple Vitamins-Minerals (MULTIVITAMINS THER. W/MINERALS) TABS Take 1 tablet by mouth daily. 30 each 0  . polyethylene glycol (MIRALAX / GLYCOLAX) packet Take 17 g by mouth daily. 14 each 0  . senna-docusate (SENOKOT-S) 8.6-50 MG tablet Take 2 tablets by mouth 2 (two) times daily. 100 tablet 0  . simvastatin (ZOCOR) 20 MG tablet Take 20 mg by mouth daily.  0  . thiamine 100 MG tablet Take 1 tablet (100 mg total) by mouth daily. 30 tablet 1   No current facility-administered medications on file prior to visit.   Today's Vitals   05/30/15 1546  BP: 131/77  Pulse: 87  Temp: 98.1 F (  36.7 C)  TempSrc: Oral  Height: 5' 7.5" (1.715 m)  Weight: 175 lb 3.2 oz (79.47 kg)  SpO2: 100%  PainSc: 0-No pain   Objective:   Physical Exam  Constitutional: He is oriented to person, place, and time. He appears well-developed and well-nourished. No distress.  HENT:  Head: Normocephalic and atraumatic.  Mouth/Throat: Oropharynx is clear and moist.  Eyes: Conjunctivae and EOM are normal.  Cardiovascular: Normal rate, regular rhythm and normal heart sounds.   Pulmonary/Chest: Effort normal and breath sounds normal. He has no wheezes. He has no rales.  Abdominal: Soft. He exhibits no  distension. There is no tenderness.  Musculoskeletal: Normal range of motion. He exhibits no edema or tenderness.  Neurological: He is alert and oriented to person, place, and time. No cranial nerve deficit. He exhibits normal muscle tone. Coordination normal.  Skin: Skin is warm and dry.  Psychiatric: He has a normal mood and affect.   Assessment & Plan:  Please refer to problem based charting.

## 2015-05-30 NOTE — Patient Instructions (Signed)
Please call [phone] to set up a follow up appointment for your stroke in the next month.  Dr. [facility] [address] [place], [place] [address]

## 2015-06-01 NOTE — Assessment & Plan Note (Signed)
BP Readings from Last 3 Encounters:  05/30/15 131/77  05/22/15 108/69  05/11/15 128/77    Lab Results  Component Value Date   NA 140 05/12/2015   K 3.7 05/12/2015   CREATININE 1.38* 05/18/2015    Assessment: Blood pressure control: Controlled Progress toward BP goal:  At goal  Plan:  -Diet controlled

## 2015-06-01 NOTE — Progress Notes (Signed)
Internal Medicine Clinic Attending  Case discussed with Dr. Krall at the time of the visit.  We reviewed the resident's history and exam and pertinent patient test results.  I agree with the assessment, diagnosis, and plan of care documented in the resident's note.  

## 2015-06-01 NOTE — Assessment & Plan Note (Signed)
Assessment: Doing well with no residual deficits.  Plan: -Continue ASA and Plavix for now. Discontinue ASA after 3 months -Encouraged patient to follow up with neurology

## 2015-09-05 ENCOUNTER — Telehealth: Payer: Self-pay | Admitting: Pulmonary Disease

## 2015-09-05 NOTE — Telephone Encounter (Signed)
APT. REMINDER CALL, LMTCB °

## 2015-09-06 ENCOUNTER — Encounter: Payer: Self-pay | Admitting: Pulmonary Disease

## 2015-09-06 ENCOUNTER — Ambulatory Visit (INDEPENDENT_AMBULATORY_CARE_PROVIDER_SITE_OTHER): Payer: Medicare Other | Admitting: Pulmonary Disease

## 2015-09-06 VITALS — BP 139/87 | HR 90 | Temp 98.1°F | Ht 67.0 in | Wt 176.0 lb

## 2015-09-06 DIAGNOSIS — Z8673 Personal history of transient ischemic attack (TIA), and cerebral infarction without residual deficits: Secondary | ICD-10-CM | POA: Diagnosis not present

## 2015-09-06 DIAGNOSIS — L84 Corns and callosities: Secondary | ICD-10-CM | POA: Insufficient documentation

## 2015-09-06 DIAGNOSIS — F17210 Nicotine dependence, cigarettes, uncomplicated: Secondary | ICD-10-CM

## 2015-09-06 DIAGNOSIS — I10 Essential (primary) hypertension: Secondary | ICD-10-CM | POA: Diagnosis not present

## 2015-09-06 DIAGNOSIS — Z23 Encounter for immunization: Secondary | ICD-10-CM

## 2015-09-06 DIAGNOSIS — I639 Cerebral infarction, unspecified: Secondary | ICD-10-CM

## 2015-09-06 DIAGNOSIS — Z0000 Encounter for general adult medical examination without abnormal findings: Secondary | ICD-10-CM | POA: Insufficient documentation

## 2015-09-06 MED ORDER — SIMVASTATIN 20 MG PO TABS: 20.0000 mg | ORAL_TABLET | Freq: Every day | ORAL | Status: DC

## 2015-09-06 MED ORDER — CLOPIDOGREL BISULFATE 75 MG PO TABS: 75.0000 mg | ORAL_TABLET | Freq: Every day | ORAL | Status: DC

## 2015-09-06 NOTE — Progress Notes (Signed)
   Subjective:    Patient ID: Andre [name], male    DOB: 01-Nov-1950, 65 y.o.   MRN: [id number]  HPI [name] is a 65 year old man with history of HTN, polysubstance abuse, CVA presenting for evaluation of toe pain.  He noticed some painful areas on the medial aspect of his PIP joint on his second toe bilaterally. It is darkened. No trauma. He has been walking more than before. He says this area rubs on his big toe. No drainage, no redness. His friend scraped one of them.   Review of Systems Constitutional: no fevers/chills Ears, nose, mouth, throat, and face: no cough Respiratory: no shortness of breath Cardiovascular: no chest pain   Past Medical History  Diagnosis Date  . Hypertension   . Stroke [facility])    Past Surgical History  Procedure Laterality Date  . Abdominal surgery     Family History  Problem Relation Age of Onset  . Hypertension Mother   . Cancer Mother     Leukemia  . Hypertension Father   . Heart disease Neg Hx   . Diabetes Neg Hx    Social History   Social History  . Marital Status: Widowed    Spouse Name: N/A  . Number of Children: N/A  . Years of Education: N/A   Social History Main Topics  . Smoking status: Current Some Day Smoker  . Smokeless tobacco: None     Comment: 1 CIGARETTE  . Alcohol Use: 0.0 oz/week    0 Standard drinks or equivalent per week  . Drug Use: None  . Sexual Activity: Not Asked   Other Topics Concern  . None   Social History Narrative    Current Outpatient Prescriptions on File Prior to Visit  Medication Sig Dispense Refill  . aspirin EC 81 MG EC tablet Take 1 tablet (81 mg total) by mouth daily. 100 tablet 0  . clopidogrel (PLAVIX) 75 MG tablet Take 1 tablet (75 mg total) by mouth daily. 30 tablet 0  . folic acid (FOLVITE) 1 MG tablet Take 1 tablet (1 mg total) by mouth daily. 30 tablet 0  . polyethylene glycol (MIRALAX / GLYCOLAX) packet Take 17 g by mouth daily. 14 each 0  . simvastatin (ZOCOR)  20 MG tablet Take 20 mg by mouth daily.  0   No current facility-administered medications on file prior to visit.   Objective:   Physical Exam  Blood pressure 139/87, pulse 90, temperature 98.1 F (36.7 C), temperature source Oral, height 5\' 7"  (1.702 m), weight 176 lb (79.833 kg), SpO2 100 %.  General Apperance: NAD HEENT: Normocephalic, atraumatic, anicteric sclera Neck: Supple, trachea midline Lungs: Clear to auscultation bilaterally. No wheezes, rhonchi or rales. Breathing comfortably Heart: Regular rate and rhythm, no murmur/rub/gallop Abdomen: Soft, nontender, nondistended, no rebound/guarding Extremities: Warm and well perfused, no edema Skin: Darkened and hardened 1cm areas on the medial aspect of PIP joint of second toe. Neurologic: Alert and interactive. No gross deficits.  Assessment & Plan:  Please refer to problem based charting.

## 2015-09-06 NOTE — Assessment & Plan Note (Signed)
Assessment: BP 139/87  Plan: Continue lifestyle modification

## 2015-09-06 NOTE — Assessment & Plan Note (Signed)
Painful areas appear to be callus formation due to increase walking and from the friction between the second toe and the large toe. Does not appear to be infected.  Referral to podiatry for foot care.

## 2015-09-06 NOTE — Patient Instructions (Addendum)
Please take your Plavix daily. You can stop taking Aspirin while you're on Plavix. Ask your pharmacy about giving you the Shingles vaccine. Follow up in 4-6 months.   Colorectal Cancer Screening Colorectal cancer screening is a group of tests used to check for colorectal cancer. Colorectal refers to your colon and rectum. Your colon and rectum are located at the end of your large intestine and carry your bowel movements out of your body.  WHY IS COLORECTAL CANCER SCREENING DONE? It is common for abnormal growths (polyps) to form in the lining of your colon, especially as you get older. These polyps can be cancerous or become cancerous. If colorectal cancer is found at an early stage, it is treatable. WHO SHOULD BE SCREENED FOR COLORECTAL CANCER? Screening is recommended for all adults at average risk starting at age 1. Tests may be recommended every 1 to 10 years. Your health care provider may recommend earlier or more frequent screening if you have:  A history of colorectal cancer or polyps.  A family member with a history of colorectal cancer or polyps.  Inflammatory bowel disease, such as ulcerative colitis or Crohn disease.  A type of hereditary colon cancer syndrome.  Colorectal cancer symptoms. TYPES OF SCREENING TESTS There are several types of colorectal screening tests. They include:  Guaiac-based fecal occult blood testing.  Sigmoidoscopy. During this test, a sigmoidoscope is used to examine your rectum and lower colon. A sigmoidoscope is a flexible tube with a camera that is inserted through your anus into your rectum and lower colon.  Colonoscopy. During this test, a colonoscope is used to examine your entire colon. A colonoscope is a long, thin, flexible tube with a camera. This test examines your entire colon and rectum.   This information is not intended to replace advice given to you by your health care provider. Make sure you discuss any questions you have with your  health care provider.   Document Released: 10/09/2009 Document Revised: 05/12/2014 Document Reviewed: 07/28/2013 Elsevier Interactive Patient Education [employer].

## 2015-09-06 NOTE — Assessment & Plan Note (Signed)
TDAP adminstered He will think about colon cancer screening. Discussed stool testing versus colonoscopy Advised him to get shingles vaccine

## 2015-09-06 NOTE — Assessment & Plan Note (Signed)
Assessment: No new neuro deficits. He ran out of Plavix and was just taking Aspirin.  Plan: -Follow up with [name] -Refill and continue Plavix -Discontinue ASA

## 2015-09-07 ENCOUNTER — Encounter: Payer: Self-pay | Admitting: *Deleted

## 2015-09-07 NOTE — Progress Notes (Signed)
Internal Medicine Clinic Attending  Case discussed with Dr. Krall at the time of the visit.  We reviewed the resident's history and exam and pertinent patient test results.  I agree with the assessment, diagnosis, and plan of care documented in the resident's note.  

## 2015-09-17 ENCOUNTER — Encounter: Payer: Self-pay | Admitting: *Deleted

## 2015-10-18 NOTE — Addendum Note (Signed)
Addended by: Yvonna Alanis E on: 10/18/2015 01:04 PM   Modules accepted: Orders, SmartSet

## 2016-02-07 ENCOUNTER — Encounter: Payer: Medicare Other | Admitting: Pulmonary Disease

## 2016-02-08 ENCOUNTER — Encounter: Payer: Self-pay | Admitting: Pulmonary Disease

## 2016-03-06 ENCOUNTER — Encounter: Payer: Medicare Other | Admitting: Pulmonary Disease

## 2016-04-24 ENCOUNTER — Ambulatory Visit (INDEPENDENT_AMBULATORY_CARE_PROVIDER_SITE_OTHER): Payer: Medicare Other | Admitting: Pulmonary Disease

## 2016-04-24 ENCOUNTER — Encounter: Payer: Self-pay | Admitting: Pulmonary Disease

## 2016-04-24 ENCOUNTER — Encounter (INDEPENDENT_AMBULATORY_CARE_PROVIDER_SITE_OTHER): Payer: Self-pay

## 2016-04-24 VITALS — BP 137/98 | HR 88 | Temp 98.2°F | Ht 67.0 in | Wt 173.8 lb

## 2016-04-24 DIAGNOSIS — Z79899 Other long term (current) drug therapy: Secondary | ICD-10-CM | POA: Diagnosis not present

## 2016-04-24 DIAGNOSIS — F17210 Nicotine dependence, cigarettes, uncomplicated: Secondary | ICD-10-CM

## 2016-04-24 DIAGNOSIS — Z8673 Personal history of transient ischemic attack (TIA), and cerebral infarction without residual deficits: Secondary | ICD-10-CM

## 2016-04-24 DIAGNOSIS — I10 Essential (primary) hypertension: Secondary | ICD-10-CM | POA: Diagnosis not present

## 2016-04-24 DIAGNOSIS — L84 Corns and callosities: Secondary | ICD-10-CM

## 2016-04-24 DIAGNOSIS — Z0000 Encounter for general adult medical examination without abnormal findings: Secondary | ICD-10-CM

## 2016-04-24 DIAGNOSIS — Z1211 Encounter for screening for malignant neoplasm of colon: Secondary | ICD-10-CM

## 2016-04-24 DIAGNOSIS — I639 Cerebral infarction, unspecified: Secondary | ICD-10-CM

## 2016-04-24 DIAGNOSIS — E785 Hyperlipidemia, unspecified: Secondary | ICD-10-CM

## 2016-04-24 DIAGNOSIS — Z23 Encounter for immunization: Secondary | ICD-10-CM | POA: Diagnosis present

## 2016-04-24 MED ORDER — CLOPIDOGREL BISULFATE 75 MG PO TABS: 75.0000 mg | ORAL_TABLET | Freq: Every day | ORAL | 2 refills | Status: DC

## 2016-04-24 MED ORDER — SIMVASTATIN 20 MG PO TABS: 20.0000 mg | ORAL_TABLET | Freq: Every day | ORAL | 2 refills | Status: DC

## 2016-04-24 NOTE — Progress Notes (Addendum)
   CC: hypertension follow up  HPI:  [name] is a 65 y.o. man with history of HTN, HLD, and CVA presenting for follow up of his hypertension.  His toe is feeling better.   Last had medication last week.   Past Medical History:  Diagnosis Date  . Hypertension   . Stroke [facility])     Review of Systems:   No fevers or chills No chest pain No new paresthesias No new weakness  Physical Exam:  Vitals:   04/24/16 1452 04/24/16 1529  BP: (!) 144/90 (!) 137/98  Pulse: (!) 102 88  Temp: 98.2 F (36.8 C)   TempSrc: Oral   SpO2: 99%   Weight: 173 lb 12.8 oz (78.8 kg)   Height: 5\' 7"  (1.702 m)    General Apperance: NAD HEENT: Normocephalic, atraumatic, anicteric sclera Neck: Supple, trachea midline Lungs: Clear to auscultation bilaterally. No wheezes, rhonchi or rales. Breathing comfortably Heart: Regular rate and rhythm, no murmur/rub/gallop Abdomen: Soft, nontender, nondistended, no rebound/guarding Extremities: Warm and well perfused, no edema Skin: No rashes or lesions Neurologic: Alert and interactive. No gross deficits.  Assessment & Plan:   See Encounters Tab for problem based charting.  Patient discussed with Dr. Daryll Drown

## 2016-04-24 NOTE — Patient Instructions (Signed)
Follow-up in 6 weeks

## 2016-04-25 NOTE — Assessment & Plan Note (Signed)
Refilled his simvastatin for secondary prevention of ASCVD

## 2016-04-25 NOTE — Assessment & Plan Note (Signed)
Assessment: Some improvement of his toe pain but he has not seen podiatry. He would still like to see them. Previously was noted that he has callus formation between his toes.  Referral to podiatry

## 2016-04-25 NOTE — Assessment & Plan Note (Signed)
Cards provided for colon cancer screening Influenza vaccine administered

## 2016-04-25 NOTE — Assessment & Plan Note (Signed)
No neuro deficits. He has not followed up with neuro.  New referral placed Restart Plavix 75mg  daily

## 2016-04-25 NOTE — Assessment & Plan Note (Signed)
Assessment: BP 137/98, almost at goal.  Plan: Continue to monitor for now. May need to be started on antihypertensive if he remains borderline. Follow up in 6 weeks

## 2016-04-30 NOTE — Progress Notes (Signed)
Internal Medicine Clinic Attending  Case discussed with Dr. Krall soon after the resident saw the patient.  We reviewed the resident's history and exam and pertinent patient test results.  I agree with the assessment, diagnosis, and plan of care documented in the resident's note. 

## 2016-05-13 ENCOUNTER — Encounter: Payer: Self-pay | Admitting: *Deleted

## 2016-06-12 ENCOUNTER — Encounter: Payer: Self-pay | Admitting: *Deleted

## 2016-06-19 ENCOUNTER — Encounter: Payer: Medicare Other | Admitting: Pulmonary Disease

## 2016-07-10 NOTE — Addendum Note (Signed)
Addended by: Hulan Fray on: 07/10/2016 08:39 PM   Modules accepted: Orders

## 2016-07-10 NOTE — Addendum Note (Signed)
Addended by: Hulan Fray on: 07/10/2016 08:54 PM   Modules accepted: Orders

## 2016-08-21 ENCOUNTER — Encounter: Payer: Medicare Other | Admitting: Pulmonary Disease

## 2016-09-03 ENCOUNTER — Telehealth: Payer: Self-pay | Admitting: Pulmonary Disease

## 2016-09-03 NOTE — Telephone Encounter (Signed)
APT. REMINDER CALL, NO ANSWER, NO VOICEMAIL °

## 2016-09-04 ENCOUNTER — Ambulatory Visit: Payer: Medicare Other

## 2016-09-09 ENCOUNTER — Ambulatory Visit (INDEPENDENT_AMBULATORY_CARE_PROVIDER_SITE_OTHER): Payer: Medicare Other | Admitting: Internal Medicine

## 2016-09-09 ENCOUNTER — Encounter: Payer: Self-pay | Admitting: Internal Medicine

## 2016-09-09 VITALS — BP 154/93 | HR 84 | Temp 98.6°F | Ht 67.0 in | Wt 169.0 lb

## 2016-09-09 DIAGNOSIS — F17210 Nicotine dependence, cigarettes, uncomplicated: Secondary | ICD-10-CM | POA: Diagnosis not present

## 2016-09-09 DIAGNOSIS — Z79899 Other long term (current) drug therapy: Secondary | ICD-10-CM | POA: Diagnosis not present

## 2016-09-09 DIAGNOSIS — Z7902 Long term (current) use of antithrombotics/antiplatelets: Secondary | ICD-10-CM

## 2016-09-09 DIAGNOSIS — I10 Essential (primary) hypertension: Secondary | ICD-10-CM | POA: Diagnosis not present

## 2016-09-09 DIAGNOSIS — Z8673 Personal history of transient ischemic attack (TIA), and cerebral infarction without residual deficits: Secondary | ICD-10-CM | POA: Diagnosis not present

## 2016-09-09 MED ORDER — AMLODIPINE BESYLATE 5 MG PO TABS: 5.0000 mg | ORAL_TABLET | Freq: Every day | ORAL | 1 refills | Status: DC

## 2016-09-09 NOTE — Assessment & Plan Note (Signed)
He is doing well, compliant with zocor and plavix.  Lab Results  Component Value Date   LDLCALC 52 05/09/2015   His LDL was low, but to follow guidelines, he may benefit from high intensity statin. I will not change it today but this can be revisited in the future by his PCP.

## 2016-09-09 NOTE — Patient Instructions (Signed)
Start taking amlodipine 5 mg daily.  Follow up in 6 weeks for recheck.

## 2016-09-09 NOTE — Progress Notes (Signed)
   CC: HTN f/up  HPI:  [name] is a 66 y.o. with PMh as listed below is here for f/up of HTN  Past Medical History:  Diagnosis Date  . Hypertension   . Stroke ([place])     HTN - not on meds currently. Last BP 137/98.   On plavix and zocor for 2ndary ASCVD prevention.    Review of Systems:   Review of Systems  Constitutional: Negative for chills and fever.  Cardiovascular: Negative for chest pain.  Genitourinary: Negative for dysuria.  Neurological: Negative for dizziness and headaches.     Physical Exam:  Vitals:   09/09/16 1413  BP: (!) 154/93  Pulse: 84  Temp: 98.6 F (37 C)  TempSrc: Oral  SpO2: 98%  Weight: 169 lb (76.7 kg)  Height: 5\' 7"  (1.702 m)   Physical Exam  Constitutional: He is oriented to person, place, and time. He appears well-developed and well-nourished.  Cardiovascular: Normal rate and regular rhythm.  Exam reveals no gallop and no friction rub.   No murmur heard. Respiratory: Effort normal and breath sounds normal. No respiratory distress. He has no wheezes.  GI: Soft. Bowel sounds are normal.  Musculoskeletal: Normal range of motion. He exhibits no edema.  Neurological: He is alert and oriented to person, place, and time.  Psychiatric: He has a normal mood and affect.    Assessment & Plan:   See Encounters Tab for problem based charting.  Patient discussed with Dr. Angelia Mould

## 2016-09-09 NOTE — Assessment & Plan Note (Signed)
Vitals:   09/09/16 1413  BP: (!) 154/93  Pulse: 84  Temp: 98.6 F (37 C)   BP is elevated. Will start him back on anti-hypertensive. Was on metop, lisinopril, and amlodipine previously before his last stroke admission last year. Does not have CAD or Diabetes. Will start him on amlodipine 5 mg daily and f/up in 6 weeks.

## 2016-09-09 NOTE — Addendum Note (Signed)
Addended by: Dellia Nims on: 09/09/2016 02:32 PM   Modules accepted: Orders

## 2016-09-16 NOTE — Progress Notes (Signed)
Internal Medicine Clinic Attending  Case discussed with Dr. Ahmed at the time of the visit.  We reviewed the resident's history and exam and pertinent patient test results.  I agree with the assessment, diagnosis, and plan of care documented in the resident's note. 

## 2016-10-17 ENCOUNTER — Encounter: Payer: Self-pay | Admitting: *Deleted

## 2017-02-27 DIAGNOSIS — B488 Other specified mycoses: Secondary | ICD-10-CM | POA: Diagnosis not present

## 2017-05-04 ENCOUNTER — Emergency Department (HOSPITAL_COMMUNITY): Payer: Medicare Other

## 2017-05-04 ENCOUNTER — Inpatient Hospital Stay (HOSPITAL_COMMUNITY): Payer: Medicare Other

## 2017-05-04 ENCOUNTER — Inpatient Hospital Stay (HOSPITAL_COMMUNITY)
Admission: EM | Admit: 2017-05-04 | Discharge: 2017-05-08 | DRG: 065 | Disposition: A | Discharge status: Skilled Nursing Facility | Payer: Medicare Other | Attending: Oncology | Admitting: Oncology

## 2017-05-04 ENCOUNTER — Encounter (HOSPITAL_COMMUNITY): Payer: Self-pay | Admitting: Emergency Medicine

## 2017-05-04 DIAGNOSIS — M6281 Muscle weakness (generalized): Secondary | ICD-10-CM | POA: Diagnosis not present

## 2017-05-04 DIAGNOSIS — Z9114 Patient's other noncompliance with medication regimen: Secondary | ICD-10-CM | POA: Diagnosis not present

## 2017-05-04 DIAGNOSIS — F1410 Cocaine abuse, uncomplicated: Secondary | ICD-10-CM | POA: Diagnosis present

## 2017-05-04 DIAGNOSIS — I6302 Cerebral infarction due to thrombosis of basilar artery: Secondary | ICD-10-CM | POA: Diagnosis not present

## 2017-05-04 DIAGNOSIS — R4702 Dysphasia: Secondary | ICD-10-CM | POA: Diagnosis present

## 2017-05-04 DIAGNOSIS — R29810 Facial weakness: Secondary | ICD-10-CM | POA: Diagnosis not present

## 2017-05-04 DIAGNOSIS — F17200 Nicotine dependence, unspecified, uncomplicated: Secondary | ICD-10-CM | POA: Diagnosis not present

## 2017-05-04 DIAGNOSIS — I69354 Hemiplegia and hemiparesis following cerebral infarction affecting left non-dominant side: Secondary | ICD-10-CM | POA: Diagnosis not present

## 2017-05-04 DIAGNOSIS — G8191 Hemiplegia, unspecified affecting right dominant side: Secondary | ICD-10-CM | POA: Diagnosis not present

## 2017-05-04 DIAGNOSIS — N179 Acute kidney failure, unspecified: Secondary | ICD-10-CM | POA: Diagnosis present

## 2017-05-04 DIAGNOSIS — I639 Cerebral infarction, unspecified: Secondary | ICD-10-CM

## 2017-05-04 DIAGNOSIS — F1099 Alcohol use, unspecified with unspecified alcohol-induced disorder: Secondary | ICD-10-CM | POA: Diagnosis not present

## 2017-05-04 DIAGNOSIS — I6930 Unspecified sequelae of cerebral infarction: Secondary | ICD-10-CM | POA: Diagnosis present

## 2017-05-04 DIAGNOSIS — Z806 Family history of leukemia: Secondary | ICD-10-CM | POA: Diagnosis not present

## 2017-05-04 DIAGNOSIS — R1311 Dysphagia, oral phase: Secondary | ICD-10-CM | POA: Diagnosis not present

## 2017-05-04 DIAGNOSIS — H53462 Homonymous bilateral field defects, left side: Secondary | ICD-10-CM | POA: Diagnosis present

## 2017-05-04 DIAGNOSIS — R471 Dysarthria and anarthria: Secondary | ICD-10-CM | POA: Diagnosis present

## 2017-05-04 DIAGNOSIS — I6523 Occlusion and stenosis of bilateral carotid arteries: Secondary | ICD-10-CM | POA: Diagnosis present

## 2017-05-04 DIAGNOSIS — R509 Fever, unspecified: Secondary | ICD-10-CM | POA: Diagnosis not present

## 2017-05-04 DIAGNOSIS — I63219 Cerebral infarction due to unspecified occlusion or stenosis of unspecified vertebral arteries: Secondary | ICD-10-CM | POA: Diagnosis not present

## 2017-05-04 DIAGNOSIS — G464 Cerebellar stroke syndrome: Secondary | ICD-10-CM | POA: Diagnosis not present

## 2017-05-04 DIAGNOSIS — R29713 NIHSS score 13: Secondary | ICD-10-CM | POA: Diagnosis not present

## 2017-05-04 DIAGNOSIS — R2681 Unsteadiness on feet: Secondary | ICD-10-CM | POA: Diagnosis not present

## 2017-05-04 DIAGNOSIS — B370 Candidal stomatitis: Secondary | ICD-10-CM | POA: Diagnosis not present

## 2017-05-04 DIAGNOSIS — K148 Other diseases of tongue: Secondary | ICD-10-CM | POA: Diagnosis not present

## 2017-05-04 DIAGNOSIS — I69322 Dysarthria following cerebral infarction: Secondary | ICD-10-CM | POA: Diagnosis not present

## 2017-05-04 DIAGNOSIS — I10 Essential (primary) hypertension: Secondary | ICD-10-CM

## 2017-05-04 DIAGNOSIS — F1010 Alcohol abuse, uncomplicated: Secondary | ICD-10-CM | POA: Diagnosis not present

## 2017-05-04 DIAGNOSIS — Z8673 Personal history of transient ischemic attack (TIA), and cerebral infarction without residual deficits: Secondary | ICD-10-CM | POA: Diagnosis not present

## 2017-05-04 DIAGNOSIS — Z7902 Long term (current) use of antithrombotics/antiplatelets: Secondary | ICD-10-CM

## 2017-05-04 DIAGNOSIS — R4781 Slurred speech: Secondary | ICD-10-CM | POA: Diagnosis not present

## 2017-05-04 DIAGNOSIS — R29818 Other symptoms and signs involving the nervous system: Secondary | ICD-10-CM | POA: Diagnosis not present

## 2017-05-04 DIAGNOSIS — I679 Cerebrovascular disease, unspecified: Secondary | ICD-10-CM | POA: Diagnosis not present

## 2017-05-04 DIAGNOSIS — I6789 Other cerebrovascular disease: Secondary | ICD-10-CM | POA: Diagnosis not present

## 2017-05-04 DIAGNOSIS — G529 Cranial nerve disorder, unspecified: Secondary | ICD-10-CM | POA: Diagnosis present

## 2017-05-04 DIAGNOSIS — Z66 Do not resuscitate: Secondary | ICD-10-CM | POA: Diagnosis not present

## 2017-05-04 DIAGNOSIS — I6389 Other cerebral infarction: Secondary | ICD-10-CM | POA: Diagnosis not present

## 2017-05-04 DIAGNOSIS — R1319 Other dysphagia: Secondary | ICD-10-CM | POA: Diagnosis not present

## 2017-05-04 DIAGNOSIS — H518 Other specified disorders of binocular movement: Secondary | ICD-10-CM | POA: Diagnosis not present

## 2017-05-04 DIAGNOSIS — F1490 Cocaine use, unspecified, uncomplicated: Secondary | ICD-10-CM | POA: Diagnosis not present

## 2017-05-04 DIAGNOSIS — R278 Other lack of coordination: Secondary | ICD-10-CM | POA: Diagnosis not present

## 2017-05-04 DIAGNOSIS — Z960 Presence of urogenital implants: Secondary | ICD-10-CM | POA: Diagnosis not present

## 2017-05-04 DIAGNOSIS — I69312 Visuospatial deficit and spatial neglect following cerebral infarction: Secondary | ICD-10-CM | POA: Diagnosis not present

## 2017-05-04 DIAGNOSIS — I69392 Facial weakness following cerebral infarction: Secondary | ICD-10-CM | POA: Diagnosis not present

## 2017-05-04 DIAGNOSIS — I672 Cerebral atherosclerosis: Secondary | ICD-10-CM | POA: Diagnosis not present

## 2017-05-04 DIAGNOSIS — F17210 Nicotine dependence, cigarettes, uncomplicated: Secondary | ICD-10-CM | POA: Diagnosis present

## 2017-05-04 DIAGNOSIS — R1310 Dysphagia, unspecified: Secondary | ICD-10-CM | POA: Diagnosis present

## 2017-05-04 DIAGNOSIS — I69391 Dysphagia following cerebral infarction: Secondary | ICD-10-CM | POA: Diagnosis not present

## 2017-05-04 DIAGNOSIS — Z8249 Family history of ischemic heart disease and other diseases of the circulatory system: Secondary | ICD-10-CM | POA: Diagnosis not present

## 2017-05-04 DIAGNOSIS — I6329 Cerebral infarction due to unspecified occlusion or stenosis of other precerebral arteries: Secondary | ICD-10-CM | POA: Diagnosis not present

## 2017-05-04 DIAGNOSIS — R079 Chest pain, unspecified: Secondary | ICD-10-CM | POA: Diagnosis not present

## 2017-05-04 HISTORY — DX: Personal history of other medical treatment: Z92.89

## 2017-05-04 HISTORY — DX: Cerebral infarction, unspecified: I63.9

## 2017-05-04 HISTORY — DX: Other psychoactive substance abuse, uncomplicated: F19.10

## 2017-05-04 LAB — URINALYSIS, ROUTINE W REFLEX MICROSCOPIC
Bilirubin Urine: NEGATIVE
Glucose, UA: NEGATIVE mg/dL
HGB URINE DIPSTICK: NEGATIVE
Ketones, ur: NEGATIVE mg/dL
LEUKOCYTES UA: NEGATIVE
Nitrite: NEGATIVE
PROTEIN: NEGATIVE mg/dL
Specific Gravity, Urine: 1.027 (ref 1.005–1.030)
pH: 7 (ref 5.0–8.0)

## 2017-05-04 LAB — I-STAT CHEM 8, ED
BUN: 6 mg/dL (ref 6–20)
CALCIUM ION: 1.08 mmol/L — AB (ref 1.15–1.40)
CHLORIDE: 103 mmol/L (ref 101–111)
CREATININE: 1 mg/dL (ref 0.61–1.24)
Glucose, Bld: 86 mg/dL (ref 65–99)
HCT: 46 % (ref 39.0–52.0)
Hemoglobin: 15.6 g/dL (ref 13.0–17.0)
Potassium: 3.2 mmol/L — ABNORMAL LOW (ref 3.5–5.1)
SODIUM: 141 mmol/L (ref 135–145)
TCO2: 24 mmol/L (ref 22–32)

## 2017-05-04 LAB — COMPREHENSIVE METABOLIC PANEL
ALT: 11 U/L — AB (ref 17–63)
AST: 23 U/L (ref 15–41)
Albumin: 3.5 g/dL (ref 3.5–5.0)
Alkaline Phosphatase: 56 U/L (ref 38–126)
Anion gap: 9 (ref 5–15)
BILIRUBIN TOTAL: 1.4 mg/dL — AB (ref 0.3–1.2)
BUN: 6 mg/dL (ref 6–20)
CO2: 24 mmol/L (ref 22–32)
CREATININE: 1.29 mg/dL — AB (ref 0.61–1.24)
Calcium: 9.2 mg/dL (ref 8.9–10.3)
Chloride: 101 mmol/L (ref 101–111)
GFR calc Af Amer: >60 mL/min (ref >60)
GFR, EST NON AFRICAN AMERICAN: 57 mL/min — AB (ref >60)
Glucose, Bld: 96 mg/dL (ref 65–99)
Potassium: 3.4 mmol/L — ABNORMAL LOW (ref 3.5–5.1)
Sodium: 134 mmol/L — ABNORMAL LOW (ref 135–145)
TOTAL PROTEIN: 6.7 g/dL (ref 6.5–8.1)

## 2017-05-04 LAB — CBC
HCT: 44 % (ref 39.0–52.0)
HEMOGLOBIN: 14.6 g/dL (ref 13.0–17.0)
MCH: 30.7 pg (ref 26.0–34.0)
MCHC: 33.2 g/dL (ref 30.0–36.0)
MCV: 92.6 fL (ref 78.0–100.0)
Platelets: 184 10*3/uL (ref 150–400)
RBC: 4.75 MIL/uL (ref 4.22–5.81)
RDW: 13.6 % (ref 11.5–15.5)
WBC: 5.8 10*3/uL (ref 4.0–10.5)

## 2017-05-04 LAB — DIFFERENTIAL
BASOS ABS: 0 10*3/uL (ref 0.0–0.1)
Basophils Relative: 0 %
Eosinophils Absolute: 0.1 10*3/uL (ref 0.0–0.7)
Eosinophils Relative: 1 %
LYMPHS ABS: 1.4 10*3/uL (ref 0.7–4.0)
Lymphocytes Relative: 23 %
MONOS PCT: 8 %
Monocytes Absolute: 0.5 10*3/uL (ref 0.1–1.0)
NEUTROS ABS: 3.9 10*3/uL (ref 1.7–7.7)
Neutrophils Relative %: 68 %

## 2017-05-04 LAB — APTT: aPTT: 27 seconds (ref 24–36)

## 2017-05-04 LAB — RAPID URINE DRUG SCREEN, HOSP PERFORMED
Amphetamines: NOT DETECTED
BARBITURATES: NOT DETECTED
Benzodiazepines: NOT DETECTED
Cocaine: POSITIVE — AB
Opiates: NOT DETECTED
Tetrahydrocannabinol: NOT DETECTED

## 2017-05-04 LAB — PROTIME-INR
INR: 1.03
PROTHROMBIN TIME: 13.4 s (ref 11.4–15.2)

## 2017-05-04 LAB — I-STAT TROPONIN, ED: TROPONIN I, POC: 0.01 ng/mL (ref 0.00–0.08)

## 2017-05-04 LAB — ETHANOL: Alcohol, Ethyl (B): <10 mg/dL (ref <10)

## 2017-05-04 LAB — ECHOCARDIOGRAM COMPLETE

## 2017-05-04 MED ORDER — ACETAMINOPHEN 160 MG/5ML PO SOLN: 650.0000 mg | ORAL | Status: DC | PRN

## 2017-05-04 MED ORDER — ACETAMINOPHEN 325 MG PO TABS
650.0000 mg | ORAL_TABLET | ORAL | Status: DC | PRN
  Administered 2017-05-06: 650 mg via ORAL
  Filled 2017-05-04: qty 2

## 2017-05-04 MED ORDER — STROKE: EARLY STAGES OF RECOVERY BOOK
Freq: Once | Status: AC
  Administered 2017-05-06: 06:00:00
  Filled 2017-05-04: qty 1

## 2017-05-04 MED ORDER — IOPAMIDOL (ISOVUE-370) INJECTION 76%
INTRAVENOUS | Status: AC
  Administered 2017-05-04: 50 mL via INTRAVENOUS
  Filled 2017-05-04: qty 50

## 2017-05-04 MED ORDER — ADULT MULTIVITAMIN W/MINERALS CH
1.0000 | ORAL_TABLET | Freq: Every day | ORAL | Status: DC
Start: 2017-05-04 — End: 2017-05-08
  Administered 2017-05-05 – 2017-05-08 (×4): 1 via ORAL
  Filled 2017-05-04 (×4): qty 1

## 2017-05-04 MED ORDER — MAGNESIUM SULFATE 2 GM/50ML IV SOLN
2.0000 g | Freq: Once | INTRAVENOUS | Status: AC
  Administered 2017-05-04: 2 g via INTRAVENOUS
  Filled 2017-05-04: qty 50

## 2017-05-04 MED ORDER — FOLIC ACID 1 MG PO TABS
1.0000 mg | ORAL_TABLET | Freq: Every day | ORAL | Status: DC
  Administered 2017-05-05 – 2017-05-08 (×4): 1 mg via ORAL
  Filled 2017-05-04 (×4): qty 1

## 2017-05-04 MED ORDER — LORAZEPAM 2 MG/ML IJ SOLN: 1.0000 mg | Freq: Four times a day (QID) | INTRAMUSCULAR | Status: DC | PRN

## 2017-05-04 MED ORDER — LORAZEPAM 1 MG PO TABS: 1.0000 mg | ORAL_TABLET | Freq: Four times a day (QID) | ORAL | Status: DC | PRN

## 2017-05-04 MED ORDER — SODIUM CHLORIDE 0.9 % IV SOLN
INTRAVENOUS | Status: DC
  Administered 2017-05-04: 15:00:00 via INTRAVENOUS

## 2017-05-04 MED ORDER — ACETAMINOPHEN 650 MG RE SUPP: 650.0000 mg | RECTAL | Status: DC | PRN

## 2017-05-04 MED ORDER — ASPIRIN EC 325 MG PO TBEC
325.0000 mg | DELAYED_RELEASE_TABLET | Freq: Every day | ORAL | Status: DC
  Administered 2017-05-05 – 2017-05-06 (×2): 325 mg via ORAL
  Filled 2017-05-04 (×2): qty 1

## 2017-05-04 MED ORDER — ENOXAPARIN SODIUM 40 MG/0.4ML ~~LOC~~ SOLN
40.0000 mg | SUBCUTANEOUS | Status: DC
  Administered 2017-05-05 – 2017-05-07 (×3): 40 mg via SUBCUTANEOUS
  Filled 2017-05-04 (×3): qty 0.4

## 2017-05-04 MED ORDER — POTASSIUM CHLORIDE 10 MEQ/100ML IV SOLN
10.0000 meq | INTRAVENOUS | Status: AC
  Administered 2017-05-04 (×3): 10 meq via INTRAVENOUS
  Filled 2017-05-04 (×2): qty 100

## 2017-05-04 MED ORDER — SENNOSIDES-DOCUSATE SODIUM 8.6-50 MG PO TABS: 1.0000 | ORAL_TABLET | Freq: Every evening | ORAL | Status: DC | PRN

## 2017-05-04 MED ORDER — IOPAMIDOL (ISOVUE-370) INJECTION 76%
INTRAVENOUS | Status: AC
Start: 2017-05-04 — End: 2017-05-04
  Filled 2017-05-04: qty 100

## 2017-05-04 MED ORDER — VITAMIN B-1 100 MG PO TABS
100.0000 mg | ORAL_TABLET | Freq: Every day | ORAL | Status: DC
  Administered 2017-05-05 – 2017-05-08 (×4): 100 mg via ORAL
  Filled 2017-05-04 (×4): qty 1

## 2017-05-04 MED ORDER — ASPIRIN 300 MG RE SUPP: 300.0000 mg | Freq: Every day | RECTAL | Status: DC

## 2017-05-04 MED ORDER — THIAMINE HCL 100 MG/ML IJ SOLN: 100.0000 mg | Freq: Every day | INTRAMUSCULAR | Status: DC

## 2017-05-04 NOTE — Progress Notes (Signed)
  Echocardiogram 2D Echocardiogram has been performed.  Andre [name] 05/04/2017, 5:02 PM

## 2017-05-04 NOTE — H&P (Signed)
Date: 05/04/2017               Patient Name:  Andre [name] MRN: [id number]  DOB: 03-30-1951 Age / Sex: 66 y.o., male   PCP: Valinda Party, DO         Medical Service: Internal Medicine Teaching Service         Attending Physician: Dr. Annia Belt, MD    First Contact: Dr. Ronalee Red Pager: [phone]  Second Contact: Dr. Heber [place] Pager: [phone]       After Hours (After 5p/  First Contact Pager: [phone]  weekends / holidays): Second Contact Pager: [phone]   Chief Complaint: right sided weakness  History of Present Illness:  Mr. Maisano is a 66yo male with PMH significant for HTN, hx of CVA (with residual LLE instability, mixed dysarthria, mild dysphagia, sensory deficits, visual deficits with diplopia, and dizziness), and hx of cocaine use who presents with right sided weakness. Last known normal 11pm on 12/30.  He states he was sleeping when he woke up last night and could not move his right arm or right leg. He also reports that he was not speaking normally. He denies chest pain or palpitations at the time of the event. Did state he had a headache. Denies dizziness. He denies abdominal pain, chest pain, shortness of breath, fevers, N/V, changes in BM, dysuria, or leg swelling.  He was brought into the ED and Code Stroke called given his right sided flaccidity and reported gaze deficit. TPA was considered but ultimately not given because no evidence of large vessel occlusion and he was outside the window.  He states that he is only on Plavix at home. He reports missing doses frequently, probably remembers to take Plavix 1-2 times per week. He is not taking anti-hypertensives or statin because he states his doctor took him off of them. He states he normally is able to walk around with a walker or a cane, however he does feel unsteady on his feet occasionally.  He smokes ~4-5 cigarettes per day. Endorses drinking "several 40 oz beers" daily. Last drink last night.  Endorses cocaine use (last used yesterday). Denies other illicit drugs.  ED Course: - BP 151/89, HR 57, RR 22, temp 98.2, O2 95% on RA - CBC wnl, INR 1.03, CMP with Na 134, K 3.4, Cr 1.29. UA negative. UDS positive for cocaine. Ethyl alcohol negative. Troponin negative. - CXR with mild cardiac enlargement, no active disease. CT head with no evidence of CVA. CT-A head with stenosis in multiple arteries, negative for emergent large vessel occlusion. Negative CT perfusion. - Received IV Mg, potassium. - Code stroke called. No TPA given. Neurology has been consulted  Meds:  No outpatient medications have been marked as taking for the 05/04/17 encounter [facility] Encounter).  - Plavix 75mg  daily - was previously prescribed amlodipine 5mg  daily and simvastatin 20mg  daily but he was not taking these medications  Allergies: Allergies as of 05/04/2017  . (No Known Allergies)   Past Medical History:  Diagnosis Date  . Hypertension   . Stroke [facility])    Family History:  Family History  Problem Relation Age of Onset  . Hypertension Mother   . Cancer Mother        Leukemia  . Hypertension Father   . Heart disease Neg Hx   . Diabetes Neg Hx    Social History:  - Smokes 4-5 cigarettes daily - Drinks "several" 40-oz beers daily - Endorses cocaine use. Denies  other illicit drug use. - Lives with a friend at home. No family in the area.  Review of Systems: A complete ROS was negative except as per HPI.  Physical Exam: Blood pressure (!) 168/99, pulse 63, temperature 98.2 F (36.8 C), temperature source Temporal, resp. rate (!) 22, SpO2 98 %.  GEN: Elderly male lying in bed in NAD. Alert and oriented x3. HENT: [place]/AT. Moist mucous membranes. No visible lesions. EYES: PERRL. Sclera non-icteric. Right gaze deviation. RESP: Clear to auscultation bilaterally. No wheezes, rales, or rhonchi. No increased work of breathing. CV: Normal rate and regular rhythm. No murmurs, gallops, or rubs. No  carotid bruits. No LE edema. ABD: Soft. Non-tender. Non-distended. Normoactive bowel sounds. EXT: No edema. Bilateral feet cool to touch. 2+ DP pulses bilaterally. NEURO: Right facial droop; decreased right facial sensation; decreased hearing on right; unable to shrug right shoulder; right gaze deviation; left homonymous hemianopsia. Unable to lift RUE and RLE against gravity. Flaccid paralysis and 0/5 strength on RUE and RLE. 4/5 LUE and LLE strength. Slurred speech. Normal finger-to-nose, heel-to-shin test on LUE and LLE. PSYCH: Patient is calm and pleasant. Appropriate affect. Well-groomed; speech is appropriate and on-subject.  Labs CBC Latest Ref Rng & Units 05/04/2017 05/04/2017 05/12/2015  WBC 4.0 - 10.5 K/uL - 5.8 6.6  Hemoglobin 13.0 - 17.0 g/dL 15.6 14.6 13.9  Hematocrit 39.0 - 52.0 % 46.0 44.0 43.4  Platelets 150 - 400 K/uL - 184 227   CMP Latest Ref Rng & Units 05/04/2017 05/04/2017 05/18/2015  Glucose 65 - 99 mg/dL 86 96 -  BUN 6 - 20 mg/dL 6 6 -  Creatinine 0.61 - 1.24 mg/dL 1.00 1.29(H) 1.38(H)  Sodium 135 - 145 mmol/L 141 134(L) -  Potassium 3.5 - 5.1 mmol/L 3.2(L) 3.4(L) -  Chloride 101 - 111 mmol/L 103 101 -  CO2 22 - 32 mmol/L - 24 -  Calcium 8.9 - 10.3 mg/dL - 9.2 -  Total Protein 6.5 - 8.1 g/dL - 6.7 -  Total Bilirubin 0.3 - 1.2 mg/dL - 1.4(H) -  Alkaline Phos 38 - 126 U/L - 56 -  AST 15 - 41 U/L - 23 -  ALT 17 - 63 U/L - 11(L) -   UA negative UDS positive for cocaine INR 1.03 Troponin 0.01 Ethyl alcohol <10  EKG: personally reviewed my interpretation is sinus rhythm  CXR: personally reviewed my interpretation is mild and stable cardiac enlargement, no edema or consolidation  CT Head 05/04/2017 1. No acute abnormality. Chronic ischemic changes stable from the prior MRI. 2. ASPECTS is 10  CT-A head and CT perfusion 05/04/2017 - Calcific plaque of the internal carotid artery origin bilaterally. 40% diameter stenosis right internal carotid artery, 30%  diameter stenosis left internal carotid artery - Severe stenosis supraclinoid internal carotid artery on the left. Mild stenosis right M1 segment and moderate stenosis left M1 segment. - Moderate stenosis distal vertebral arteries bilaterally. Mild stenosis mid basilar. - Negative for emergent large vessel occlusion. - Negative CT perfusion  Assessment & Plan by Problem: Active Problems:   CVA (cerebral vascular accident) [facility])  Mr. Velazquez is a 66yo male with PMH significant for HTN, hx of strokes, and hx of cocaine use who presents with right sided weakness and slurred speech since 11pm last night. Code stroke called, no TPA given secondary to CT head negative for large vessel occlusion and outside window.  Right sided hemiplegia, R facial droop, and slurred speech, likely acute CVA In setting of HTN and cocaine  use, not currently on medications except Plavix. Patient had a prior CVA in 2017, for which he was taking Plavix 75mg  daily. He reports that he often forgets to take this and only does take it approximately 1-2 times per week. Code stroke called, no TPA given. Last A1c 5.1 in 2017. Chol 137, HDL 45, LDL 52, TG 198. BP mildly elevated in ED - 140s-150s/80s-90s. Not taking statin or anti-hypertensive. CT head without acute CVA and CT-A shows multiple areas of stenosis. CT head perfusion study negative. - Neurology consulted; appreciate their recommendations - Telemetry - MRI brain - A1c, lipid panel - echo - Neuro checks q2h - PT/OT eval - speech swallow eval - NPO until speech swallow eval - Allowing for permissive HTN - Continue home simvastatin 20mg  daily and plavix 75mg  daily  AKI UA negative. Cr 1.29 (baseline ~1.1-1.3). - IVF NS 14mL/hr - CBC, BMET in AM  Hx of HTN BP elevated in the ED, most recent 151/89. HR 58. Home regimen includes amlodipine 5mg  daily. Patient states he had not been taking this medication at home because he states that his previous doctor took him  off of it. - Hold home amlodipine to allow for permissive HTN  Hx of cocaine use UDS positive for cocaine. Last used 12/30 PM.  Alcohol use disorder Drinks 2-3 40oz beers per day. Last drink 12/30 PM. - CIWA with Ativan - Thiamine, folate - MVI  Diet: NPO until speech swallow eval VTE PPx: Lovenox Code: DNR/DNI Dispo: Admit patient to Inpatient with expected length of stay greater than 2 midnights.  Signed: Colbert Ewing, MD 05/04/2017, 2:11 PM  Pager: Mamie Nick [phone]

## 2017-05-04 NOTE — Consult Note (Signed)
Requesting Physician: Dr. Dayna Barker    Chief Complaint: Right side weakness  History obtained from: Patient and Chart     HPI:                                                                                                                                       AARAV [name] is an 66 y.o. male with PMH of stroke, HTN, cocaine abuse presents with right side weakness and right gaze deviation. He was  brought by EMS and was  stroke alerted.  Last known normal was 11 PM last night when he went to bed. Patient woke up this morning and noticed that he was not able to move his right side. BP was 732 systolic. Admits to using cocaine yesterday, also admitted to smoking and  excessive alcohol consumption. He was supposed to be on Plavix however is not taking it regularly.   On arrival to [facility] ER, he was flaccid on the right side and had right gaze deviation. A stat CT head was negative. Has not candidate for TPA as he was outside the window and not candidate for mechanical thrombectomy of the ribs no large vessel occlusion on CT angiogram.    Date last known well: 12.30.18 Time last known well: 11 pm tPA Given: no, outside window  NIHSS:  Baseline MRS 0    Past Medical History:  Diagnosis Date  . Hypertension   . Stroke [facility])     Past Surgical History:  Procedure Laterality Date  . ABDOMINAL SURGERY      Family History  Problem Relation Age of Onset  . Hypertension Mother   . Cancer Mother        Leukemia  . Hypertension Father   . Heart disease Neg Hx   . Diabetes Neg Hx    Social History:  reports that he has been smoking cigarettes.  He has been smoking about 0.20 packs per day. he has never used smokeless tobacco. He reports that he drinks alcohol. He reports that he does not use drugs.  Allergies: No Known Allergies  Medications:                                                                                                                        I reviewed home  medications   ROS:  14 systems reviewed and negative except above    Examination:                                                                                                      General: Appears well-developed and well-nourished.  Psych: Affect appropriate to situation Eyes: No scleral injection HENT: No OP obstrucion Head: Normocephalic.  Cardiovascular: Normal rate and regular rhythm.  Respiratory: Effort normal and breath sounds normal to anterior ascultation GI: Soft.  No distension. There is no tenderness.  Skin: WDI   Neurological Examination Mental Status: Alert, oriented, thought content appropriate.  Speech severely dysarthric. Able to follow 2 step commands without difficulty. Cranial Nerves: II: Visual fields grossly normal,  III,IV, VI: forced gaze deviation to right side. pupils equal, round, reactive to light and accommodation V,VII: smile symmetric, facial light touch sensation normal bilaterally VIII: hearing normal bilaterally IX,X: uvula rises symmetrically XI: bilateral shoulder shrug XII: midline tongue extension Motor: Right : Upper extremity   0/5    Left:     Upper extremity   5/5  Lower extremity   0/5     Lower extremity   5/5 Tone and bulk:normal tone throughout; no atrophy noted Sensory: Pinprick and light touch intact throughout, bilaterally Deep Tendon Reflexes: 2+ and symmetric throughout Plantars: Right: downgoing   Left: downgoing Cerebellar: normal finger-to-nose on left side Gait: unable to walk      Lab Results: Basic Metabolic Panel: Recent Labs  Lab 05/04/17 0944  NA 141  K 3.2*  CL 103  GLUCOSE 86  BUN 6  CREATININE 1.00    CBC: Recent Labs  Lab 05/04/17 0931 05/04/17 0944  WBC 5.8  --   NEUTROABS 3.9  --   HGB 14.6 15.6  HCT 44.0 46.0  MCV 92.6  --   PLT 184  --      Coagulation Studies: Recent Labs    05/04/17 0931  LABPROT 13.4  INR 1.03    Imaging: Ct Head Code Stroke Wo Contrast  Result Date: 05/04/2017 CLINICAL DATA:  Code stroke.  Right-sided weakness EXAM: CT HEAD WITHOUT CONTRAST TECHNIQUE: Contiguous axial images were obtained from the base of the skull through the vertex without intravenous contrast. COMPARISON:  MRI 05/08/2015 FINDINGS: Brain: Chronic left PICA infarct unchanged. Small chronic infarcts in the basal ganglia bilaterally unchanged. Mild chronic microvascular ischemic change in the white matter. Negative for acute infarct, hemorrhage, or mass. Vascular: Atherosclerotic calcification carotid and vertebral arteries. Negative for hyperdense vessel Skull: Negative Sinuses/Orbits: Negative Other: None ASPECTS ([place] Stroke Program Early CT Score) - Ganglionic level infarction (caudate, lentiform nuclei, internal capsule, insula, M1-M3 cortex): 7 - Supraganglionic infarction (M4-M6 cortex): 3 Total score (0-10 with 10 being normal): 10 IMPRESSION: 1. No acute abnormality. Chronic ischemic changes stable from the prior MRI. 2. ASPECTS is 10 Electronically Signed   By: Franchot Gallo M.D.   On: 05/04/2017 09:53     ASSESSMENT AND PLAN   66 y.o. male with PMH of stroke, HTN, cocaine abuse presents with right side weakness and right gaze deviation. Did  not receive TPA as he is outside the window CTA negative for LVO. He likely had a pontine stroke. D/D includes seizure with post ictal Todd's however less likely as patient is alert.   Acute Ischemic Stroke    Recommend # MRI of the brain without contrast  #Transthoracic Echo  # Start patient on ASA 325mg  daily or Plavix 75 mg daily  #Start or continue Atorvastatin 80 mg/other high intensity statin # BP goal: permissive HTN upto 505 systolic, PRNs above 697 # HBAIC and Lipid profile # Telemetry monitoring # Frequent neuro checks # NPO until passes stroke swallow  screen  Please page stroke NP  Or  PA  Or MD from 8am -4 pm  as this patient from this time will be  followed by the stroke.   You can look them up on [email or web address]  Password [facility]   Sushanth Aroor [facility] Pager Number [phone]

## 2017-05-04 NOTE — ED Provider Notes (Signed)
Emergency Department Provider Note   I have reviewed the triage vital signs and the nursing notes.   HISTORY  Chief Complaint Code Stroke   HPI Andre [name] is a 66 y.o. male w/ h/o stroke, hypertension and cocaine abuse that went to bed normal around 11. This morning with difficulty speaking, walking and brought in. No further history secondary to mental status and difficulty speaking secondary to his medical condition.  LEVEL V Caveat Secondary to Stroke   Past Medical History:  Diagnosis Date  . Hypertension   . Stroke [facility])     Patient Active Problem List   Diagnosis Date Noted  . Callus 09/06/2015  . Healthcare maintenance 09/06/2015  . Essential hypertension 05/09/2015  . History of CVA (cerebrovascular accident) 05/09/2015  . Tobacco use disorder   . HLD (hyperlipidemia)   . Alcohol abuse   . Polysubstance abuse ([place]) 03/31/2011    Past Surgical History:  Procedure Laterality Date  . ABDOMINAL SURGERY      Current Outpatient Rx  . Order #: [id number] Class: Normal  . Order #: [id number] Class: Normal  . Order #: [id number] Class: Normal    Allergies Patient has no known allergies.  Family History  Problem Relation Age of Onset  . Hypertension Mother   . Cancer Mother        Leukemia  . Hypertension Father   . Heart disease Neg Hx   . Diabetes Neg Hx     Social History Social History   Tobacco Use  . Smoking status: Current Some Day Smoker    Packs/day: 0.20    Types: Cigarettes  . Smokeless tobacco: Never Used  . Tobacco comment: 2OR LESS CIGARETTE A DAY  Substance Use Topics  . Alcohol use: Yes    Alcohol/week: 0.0 oz    Comment: Beeer sometimes.  . Drug use: No    Review of Systems  LEVEL V Caveat Secondary to Stroke ____________________________________________   PHYSICAL EXAM:  VITAL SIGNS: Vitals:   05/04/17 1030 05/04/17 1100  BP: (!) 165/90 (!) 143/91  Pulse: (!) 58 62  Resp: (!) 23 (!) 25  Temp:    SpO2: 99%  97%    Constitutional: Alert. Well appearing and in no acute distress. Eyes: Conjunctivae are normal. PERRL. EOMI. Head: Atraumatic. Nose: No congestion/rhinnorhea. Mouth/Throat: Mucous membranes are moist.  Oropharynx non-erythematous. Neck: No stridor.  No meningeal signs.   Cardiovascular: Normal rate, regular rhythm. Good peripheral circulation. Grossly normal heart sounds.   Respiratory: Normal respiratory effort.  No retractions. Lungs CTAB. Gastrointestinal: Soft and nontender. No distention.  Musculoskeletal: No lower extremity tenderness nor edema. No gross deformities of extremities. Neurologic:  Right eye deviation. Flaccid right side.  Skin:  Skin is warm, dry and intact. No rash noted.   ____________________________________________   LABS (all labs ordered are listed, but only abnormal results are displayed)  Labs Reviewed  COMPREHENSIVE METABOLIC PANEL - Abnormal; Notable for the following components:      Result Value   Sodium 134 (*)    Potassium 3.4 (*)    Creatinine, Ser 1.29 (*)    ALT 11 (*)    Total Bilirubin 1.4 (*)    GFR calc non Af Amer 57 (*)    All other components within normal limits  I-STAT CHEM 8, ED - Abnormal; Notable for the following components:   Potassium 3.2 (*)    Calcium, Ion 1.08 (*)    All other components within normal limits  ETHANOL  PROTIME-INR  APTT  CBC  DIFFERENTIAL  URINALYSIS, ROUTINE W REFLEX MICROSCOPIC  RAPID URINE DRUG SCREEN, HOSP PERFORMED  I-STAT TROPONIN, ED   ____________________________________________  EKG   EKG Interpretation  Date/Time:  Monday May 04 2017 09:28:06 EST Ventricular Rate:  63 PR Interval:    QRS Duration: 85 QT Interval:  445 QTC Calculation: 456 R Axis:   54 Text Interpretation:  Sinus rhythm Anterior infarct, old No significant change since last tracing Confirmed by Merrily Pew [phone]) on 05/04/2017 10:49:54 AM        ____________________________________________  RADIOLOGY  Ct Angio Head W Or Wo Contrast  Result Date: 05/04/2017 CLINICAL DATA:  Stroke.  Right-sided weakness, slurred speech EXAM: CT ANGIOGRAPHY HEAD AND NECK CT PERFUSION BRAIN TECHNIQUE: Multidetector CT imaging of the head and neck was performed using the standard protocol during bolus administration of intravenous contrast. Multiplanar CT image reconstructions and MIPs were obtained to evaluate the vascular anatomy. Carotid stenosis measurements (when applicable) are obtained utilizing NASCET criteria, using the distal internal carotid diameter as the denominator. Multiphase CT imaging of the brain was performed following IV bolus contrast injection. Subsequent parametric perfusion maps were calculated using RAPID software. CONTRAST:  < 100 mL > ISOVUE-370 IOPAMIDOL (ISOVUE-370) INJECTION 76%, COMPARISON:  CT head 05/04/2017 FINDINGS: CTA NECK FINDINGS Aortic arch: Mild atherosclerotic disease aortic arch. Proximal great vessels widely patent. Right carotid system: Right common carotid artery widely patent. Calcified plaque right carotid bulb. 40% diameter stenosis proximal right internal carotid artery. Left carotid system: Left common carotid artery widely patent with mild atherosclerotic plaque. Calcified plaque proximal left internal carotid artery. 30% diameter stenosis proximal left internal carotid artery Vertebral arteries: Both vertebral arteries are patent to the basilar. Proximal vertebral artery widely patent bilaterally. Atherosclerotic disease distal vertebral artery bilaterally. See below comments. Skeleton: Cervical spondylosis.  No acute skeletal abnormality. Other neck: Benign dermal cysts in the jaw region bilaterally. Upper chest: Mild apical emphysema bilaterally. No infiltrate or mass in the lung apices. Review of the MIP images confirms the above findings CTA HEAD FINDINGS Anterior circulation: Atherosclerotic disease right  cavernous carotid with mild stenosis. Right anterior and middle cerebral arteries patent. Mild narrowing distal right M1 segment Concentric calcific plaque supraclinoid left internal carotid artery with severe stenosis. Moderate stenosis left M1 segment. Hypoplastic left A1 segment. Both anterior cerebral arteries supplied from the right. Posterior circulation: Moderate stenosis distal vertebral artery at the level the skullbase bilaterally. Mild stenosis basilar artery. Superior cerebellar, superior cerebellar, and posterior cerebral artery patent bilaterally. Fetal origin left posterior cerebral artery. Venous sinuses: Patent Anatomic variants: None Delayed phase: Not performed Review of the MIP images confirms the above findings CT Brain Perfusion Findings: CBF (<30%) Volume: 4mL Perfusion (Tmax>6.0s) volume: 64mL Mismatch Volume: 25mL IMPRESSION: Calcific plaque of the internal carotid artery origin bilaterally. 40% diameter stenosis right internal carotid artery, 30% diameter stenosis left internal carotid artery Severe stenosis supraclinoid internal carotid artery on the left. Mild stenosis right M1 segment and moderate stenosis left M1 segment. Moderate stenosis distal vertebral arteries bilaterally. Mild stenosis mid basilar. Negative for emergent large vessel occlusion. Negative CT perfusion Electronically Signed   By: Franchot Gallo M.D.   On: 05/04/2017 10:50   Ct Angio Neck W Or Wo Contrast  Result Date: 05/04/2017 CLINICAL DATA:  Stroke.  Right-sided weakness, slurred speech EXAM: CT ANGIOGRAPHY HEAD AND NECK CT PERFUSION BRAIN TECHNIQUE: Multidetector CT imaging of the head and neck was performed using the standard protocol during bolus administration of intravenous  contrast. Multiplanar CT image reconstructions and MIPs were obtained to evaluate the vascular anatomy. Carotid stenosis measurements (when applicable) are obtained utilizing NASCET criteria, using the distal internal carotid diameter as  the denominator. Multiphase CT imaging of the brain was performed following IV bolus contrast injection. Subsequent parametric perfusion maps were calculated using RAPID software. CONTRAST:  < 100 mL > ISOVUE-370 IOPAMIDOL (ISOVUE-370) INJECTION 76%, COMPARISON:  CT head 05/04/2017 FINDINGS: CTA NECK FINDINGS Aortic arch: Mild atherosclerotic disease aortic arch. Proximal great vessels widely patent. Right carotid system: Right common carotid artery widely patent. Calcified plaque right carotid bulb. 40% diameter stenosis proximal right internal carotid artery. Left carotid system: Left common carotid artery widely patent with mild atherosclerotic plaque. Calcified plaque proximal left internal carotid artery. 30% diameter stenosis proximal left internal carotid artery Vertebral arteries: Both vertebral arteries are patent to the basilar. Proximal vertebral artery widely patent bilaterally. Atherosclerotic disease distal vertebral artery bilaterally. See below comments. Skeleton: Cervical spondylosis.  No acute skeletal abnormality. Other neck: Benign dermal cysts in the jaw region bilaterally. Upper chest: Mild apical emphysema bilaterally. No infiltrate or mass in the lung apices. Review of the MIP images confirms the above findings CTA HEAD FINDINGS Anterior circulation: Atherosclerotic disease right cavernous carotid with mild stenosis. Right anterior and middle cerebral arteries patent. Mild narrowing distal right M1 segment Concentric calcific plaque supraclinoid left internal carotid artery with severe stenosis. Moderate stenosis left M1 segment. Hypoplastic left A1 segment. Both anterior cerebral arteries supplied from the right. Posterior circulation: Moderate stenosis distal vertebral artery at the level the skullbase bilaterally. Mild stenosis basilar artery. Superior cerebellar, superior cerebellar, and posterior cerebral artery patent bilaterally. Fetal origin left posterior cerebral artery. Venous  sinuses: Patent Anatomic variants: None Delayed phase: Not performed Review of the MIP images confirms the above findings CT Brain Perfusion Findings: CBF (<30%) Volume: 40mL Perfusion (Tmax>6.0s) volume: 18mL Mismatch Volume: 62mL IMPRESSION: Calcific plaque of the internal carotid artery origin bilaterally. 40% diameter stenosis right internal carotid artery, 30% diameter stenosis left internal carotid artery Severe stenosis supraclinoid internal carotid artery on the left. Mild stenosis right M1 segment and moderate stenosis left M1 segment. Moderate stenosis distal vertebral arteries bilaterally. Mild stenosis mid basilar. Negative for emergent large vessel occlusion. Negative CT perfusion Electronically Signed   By: Franchot Gallo M.D.   On: 05/04/2017 10:50   Ct Cerebral Perfusion W Contrast  Result Date: 05/04/2017 CLINICAL DATA:  Stroke.  Right-sided weakness, slurred speech EXAM: CT ANGIOGRAPHY HEAD AND NECK CT PERFUSION BRAIN TECHNIQUE: Multidetector CT imaging of the head and neck was performed using the standard protocol during bolus administration of intravenous contrast. Multiplanar CT image reconstructions and MIPs were obtained to evaluate the vascular anatomy. Carotid stenosis measurements (when applicable) are obtained utilizing NASCET criteria, using the distal internal carotid diameter as the denominator. Multiphase CT imaging of the brain was performed following IV bolus contrast injection. Subsequent parametric perfusion maps were calculated using RAPID software. CONTRAST:  < 100 mL > ISOVUE-370 IOPAMIDOL (ISOVUE-370) INJECTION 76%, COMPARISON:  CT head 05/04/2017 FINDINGS: CTA NECK FINDINGS Aortic arch: Mild atherosclerotic disease aortic arch. Proximal great vessels widely patent. Right carotid system: Right common carotid artery widely patent. Calcified plaque right carotid bulb. 40% diameter stenosis proximal right internal carotid artery. Left carotid system: Left common carotid artery  widely patent with mild atherosclerotic plaque. Calcified plaque proximal left internal carotid artery. 30% diameter stenosis proximal left internal carotid artery Vertebral arteries: Both vertebral arteries are patent to the basilar.  Proximal vertebral artery widely patent bilaterally. Atherosclerotic disease distal vertebral artery bilaterally. See below comments. Skeleton: Cervical spondylosis.  No acute skeletal abnormality. Other neck: Benign dermal cysts in the jaw region bilaterally. Upper chest: Mild apical emphysema bilaterally. No infiltrate or mass in the lung apices. Review of the MIP images confirms the above findings CTA HEAD FINDINGS Anterior circulation: Atherosclerotic disease right cavernous carotid with mild stenosis. Right anterior and middle cerebral arteries patent. Mild narrowing distal right M1 segment Concentric calcific plaque supraclinoid left internal carotid artery with severe stenosis. Moderate stenosis left M1 segment. Hypoplastic left A1 segment. Both anterior cerebral arteries supplied from the right. Posterior circulation: Moderate stenosis distal vertebral artery at the level the skullbase bilaterally. Mild stenosis basilar artery. Superior cerebellar, superior cerebellar, and posterior cerebral artery patent bilaterally. Fetal origin left posterior cerebral artery. Venous sinuses: Patent Anatomic variants: None Delayed phase: Not performed Review of the MIP images confirms the above findings CT Brain Perfusion Findings: CBF (<30%) Volume: 40mL Perfusion (Tmax>6.0s) volume: 52mL Mismatch Volume: 84mL IMPRESSION: Calcific plaque of the internal carotid artery origin bilaterally. 40% diameter stenosis right internal carotid artery, 30% diameter stenosis left internal carotid artery Severe stenosis supraclinoid internal carotid artery on the left. Mild stenosis right M1 segment and moderate stenosis left M1 segment. Moderate stenosis distal vertebral arteries bilaterally. Mild stenosis  mid basilar. Negative for emergent large vessel occlusion. Negative CT perfusion Electronically Signed   By: Franchot Gallo M.D.   On: 05/04/2017 10:50   Dg Chest Portable 1 View  Result Date: 05/04/2017 CLINICAL DATA:  Altered mental status EXAM: PORTABLE CHEST 1 VIEW COMPARISON:  May 08, 2015 FINDINGS: Lungs are clear. Heart is mildly enlarged with pulmonary vascularity within normal limits. No adenopathy. No pneumothorax. No bone lesions. IMPRESSION: No edema or consolidation. Heart mildly enlarged, stable. No evident adenopathy. Electronically Signed   By: Lowella Grip III M.D.   On: 05/04/2017 10:53   Ct Head Code Stroke Wo Contrast  Addendum Date: 05/04/2017   ADDENDUM REPORT: 05/04/2017 10:09 ADDENDUM: These results were called by telephone at the time of interpretation on 05/04/2017 at 10:09 am to Dr. Lorraine Lax, who verbally acknowledged these results. Electronically Signed   By: Franchot Gallo M.D.   On: 05/04/2017 10:09   Result Date: 05/04/2017 CLINICAL DATA:  Code stroke.  Right-sided weakness EXAM: CT HEAD WITHOUT CONTRAST TECHNIQUE: Contiguous axial images were obtained from the base of the skull through the vertex without intravenous contrast. COMPARISON:  MRI 05/08/2015 FINDINGS: Brain: Chronic left PICA infarct unchanged. Small chronic infarcts in the basal ganglia bilaterally unchanged. Mild chronic microvascular ischemic change in the white matter. Negative for acute infarct, hemorrhage, or mass. Vascular: Atherosclerotic calcification carotid and vertebral arteries. Negative for hyperdense vessel Skull: Negative Sinuses/Orbits: Negative Other: None ASPECTS ([place] Stroke Program Early CT Score) - Ganglionic level infarction (caudate, lentiform nuclei, internal capsule, insula, M1-M3 cortex): 7 - Supraganglionic infarction (M4-M6 cortex): 3 Total score (0-10 with 10 being normal): 10 IMPRESSION: 1. No acute abnormality. Chronic ischemic changes stable from the prior MRI. 2.  ASPECTS is 10 Electronically Signed: By: Franchot Gallo M.D. On: 05/04/2017 09:53    ____________________________________________   PROCEDURES  Procedure(s) performed:   Procedures  CRITICAL CARE Performed by: Merrily Pew Total critical care time: 35 minutes Critical care time was exclusive of separately billable procedures and treating other patients. Critical care was necessary to treat or prevent imminent or life-threatening deterioration. Critical care was time spent personally by me on the following  activities: development of treatment plan with patient and/or surrogate as well as nursing, discussions with consultants, evaluation of patient's response to treatment, examination of patient, obtaining history from patient or surrogate, ordering and performing treatments and interventions, ordering and review of laboratory studies, ordering and review of radiographic studies, pulse oximetry and re-evaluation of patient's condition. ____________________________________________   INITIAL IMPRESSION / ASSESSMENT AND PLAN / ED COURSE  He was made code stroke immediately on arrival as he told nurse he was normal last night at 11:00 last night and has a gaze deficit along with right-sided flaccidity when he woke up this morning.  This is making him positive for large vessel occlusion.  TPA considered but not given secondary to no evidence of large vessel occlusion and being outside the window for the likely pontine stroke that he has.  Neurology recommends admission to medicine for further evaluation.   Pertinent labs & imaging results that were available during my care of the patient were reviewed by me and considered in my medical decision making (see chart for details).  ____________________________________________  FINAL CLINICAL IMPRESSION(S) / ED DIAGNOSES  Final diagnoses:  Cerebrovascular accident (CVA), unspecified mechanism ([place])     MEDICATIONS GIVEN DURING THIS  VISIT:  Medications  iopamidol (ISOVUE-370) 76 % injection (not administered)  potassium chloride 10 mEq in 100 mL IVPB (not administered)  magnesium sulfate IVPB 2 g 50 mL (not administered)  iopamidol (ISOVUE-370) 76 % injection (50 mLs Intravenous Contrast Given 05/04/17 1036)  iopamidol (ISOVUE-370) 76 % injection (50 mLs Intravenous Contrast Given 05/04/17 1034)     NEW OUTPATIENT MEDICATIONS STARTED DURING THIS VISIT:  This SmartLink is deprecated. Use AVSMEDLIST instead to display the medication list for a patient.  Note:  This note was prepared with assistance of Dragon voice recognition software. Occasional wrong-word or sound-a-like substitutions may have occurred due to the inherent limitations of voice recognition software.   Merrily Pew, MD 05/04/17 1130

## 2017-05-04 NOTE — ED Notes (Signed)
Admitting at bedside 

## 2017-05-04 NOTE — ED Notes (Signed)
Report called x2

## 2017-05-04 NOTE — ED Notes (Signed)
Pt repositioned in bed. Still unable to move right side. Pt pulled up in bed and re adjusted with towel rolls placed under right leg. This RN asked if there was anyone I could call as he is alone, pt states no one he would like to be called. Pt is resting comfortably with eyes closed at this time.

## 2017-05-04 NOTE — ED Triage Notes (Addendum)
Pt arrives to ED flaccid on right side with speech deficits. Pt has facial droop. Pt states he laid down at 11pm and son called this morning at 0500 and noticed his speech was very different.  Pt admits to using cocaine sometime after 12am. Pt is incontinent of urine of arrival to eD. Pt is alert and oriented however due to weakness LVO stroke was paged out.

## 2017-05-04 NOTE — Code Documentation (Signed)
66yo male arriving to [facility] via [place] at [phone].  Patient was at a hotel where he was found to have right gaze and right hemiplegia per EMS.  Patient reports getting in bed at 2300 last night at his baseline.  Of note, patient also reports using cocaine sometime overnight.  Code stroke called on arrival for VAN+ assessment.  Patient to CT.  Stroke team to the bedside.  NIHSS 13, see documentation for details and code stroke times.  Patient with right fixed gaze, left hemianopia, right facial weakness, right hemiplegia, and dysarthria on exam.  Patient is outside the window for treatment with tPA.  CT followed by CTP and CTA completed.  Patient is not an endovascular intervention candidate.  Patient to be admitted for workup.  Bedside handoff with ED RN Janett Billow.

## 2017-05-05 ENCOUNTER — Other Ambulatory Visit: Payer: Self-pay

## 2017-05-05 ENCOUNTER — Encounter (HOSPITAL_COMMUNITY): Payer: Self-pay

## 2017-05-05 DIAGNOSIS — I69322 Dysarthria following cerebral infarction: Secondary | ICD-10-CM

## 2017-05-05 DIAGNOSIS — Z9114 Patient's other noncompliance with medication regimen: Secondary | ICD-10-CM

## 2017-05-05 DIAGNOSIS — I10 Essential (primary) hypertension: Secondary | ICD-10-CM

## 2017-05-05 DIAGNOSIS — F1410 Cocaine abuse, uncomplicated: Secondary | ICD-10-CM

## 2017-05-05 DIAGNOSIS — N179 Acute kidney failure, unspecified: Secondary | ICD-10-CM

## 2017-05-05 DIAGNOSIS — G8191 Hemiplegia, unspecified affecting right dominant side: Secondary | ICD-10-CM

## 2017-05-05 DIAGNOSIS — I69312 Visuospatial deficit and spatial neglect following cerebral infarction: Secondary | ICD-10-CM

## 2017-05-05 DIAGNOSIS — F17200 Nicotine dependence, unspecified, uncomplicated: Secondary | ICD-10-CM

## 2017-05-05 DIAGNOSIS — Z7902 Long term (current) use of antithrombotics/antiplatelets: Secondary | ICD-10-CM

## 2017-05-05 DIAGNOSIS — Z8249 Family history of ischemic heart disease and other diseases of the circulatory system: Secondary | ICD-10-CM

## 2017-05-05 DIAGNOSIS — F1010 Alcohol abuse, uncomplicated: Secondary | ICD-10-CM

## 2017-05-05 DIAGNOSIS — F17210 Nicotine dependence, cigarettes, uncomplicated: Secondary | ICD-10-CM

## 2017-05-05 DIAGNOSIS — R1310 Dysphagia, unspecified: Secondary | ICD-10-CM

## 2017-05-05 DIAGNOSIS — I69391 Dysphagia following cerebral infarction: Secondary | ICD-10-CM

## 2017-05-05 DIAGNOSIS — I6302 Cerebral infarction due to thrombosis of basilar artery: Secondary | ICD-10-CM

## 2017-05-05 DIAGNOSIS — I679 Cerebrovascular disease, unspecified: Secondary | ICD-10-CM

## 2017-05-05 LAB — BASIC METABOLIC PANEL
ANION GAP: 13 (ref 5–15)
BUN: 8 mg/dL (ref 6–20)
CO2: 24 mmol/L (ref 22–32)
Calcium: 9 mg/dL (ref 8.9–10.3)
Chloride: 100 mmol/L — ABNORMAL LOW (ref 101–111)
Creatinine, Ser: 1.19 mg/dL (ref 0.61–1.24)
GFR calc Af Amer: >60 mL/min (ref >60)
GLUCOSE: 67 mg/dL (ref 65–99)
POTASSIUM: 3.7 mmol/L (ref 3.5–5.1)
Sodium: 137 mmol/L (ref 135–145)

## 2017-05-05 LAB — LIPID PANEL
CHOL/HDL RATIO: 2.1 ratio
Cholesterol: 125 mg/dL (ref 0–200)
HDL: 59 mg/dL (ref >40)
LDL CALC: 45 mg/dL (ref 0–99)
Triglycerides: 106 mg/dL (ref <150)
VLDL: 21 mg/dL (ref 0–40)

## 2017-05-05 LAB — CBC
HEMATOCRIT: 45.3 % (ref 39.0–52.0)
Hemoglobin: 14.6 g/dL (ref 13.0–17.0)
MCH: 30.2 pg (ref 26.0–34.0)
MCHC: 32.2 g/dL (ref 30.0–36.0)
MCV: 93.6 fL (ref 78.0–100.0)
PLATELETS: 187 10*3/uL (ref 150–400)
RBC: 4.84 MIL/uL (ref 4.22–5.81)
RDW: 13.7 % (ref 11.5–15.5)
WBC: 5.7 10*3/uL (ref 4.0–10.5)

## 2017-05-05 LAB — HEMOGLOBIN A1C
Hgb A1c MFr Bld: 4.6 % — ABNORMAL LOW (ref 4.8–5.6)
Mean Plasma Glucose: 85.32 mg/dL

## 2017-05-05 MED ORDER — ATORVASTATIN CALCIUM 80 MG PO TABS
80.0000 mg | ORAL_TABLET | Freq: Every day | ORAL | Status: DC
  Administered 2017-05-05 – 2017-05-07 (×3): 80 mg via ORAL
  Filled 2017-05-05 (×3): qty 1

## 2017-05-05 MED ORDER — ENSURE ENLIVE PO LIQD
237.0000 mL | Freq: Two times a day (BID) | ORAL | Status: DC
  Administered 2017-05-06 – 2017-05-08 (×5): 237 mL via ORAL

## 2017-05-05 MED ORDER — POTASSIUM CHLORIDE IN NACL 40-0.9 MEQ/L-% IV SOLN
INTRAVENOUS | Status: DC
  Administered 2017-05-05 – 2017-05-06 (×2): 75 mL/h via INTRAVENOUS
  Filled 2017-05-05 (×4): qty 1000

## 2017-05-05 MED ORDER — CLOPIDOGREL BISULFATE 75 MG PO TABS
75.0000 mg | ORAL_TABLET | Freq: Every day | ORAL | Status: DC
  Administered 2017-05-05 – 2017-05-08 (×4): 75 mg via ORAL
  Filled 2017-05-05 (×4): qty 1

## 2017-05-05 NOTE — Progress Notes (Signed)
NEUROHOSPITALISTS STROKE TEAM - DAILY PROGRESS NOTE   ADMISSION HISTORY: Andre [name] is an 67 y.o. male with PMH of stroke, HTN, cocaine abuse presents with right side weakness and right gaze deviation. He was  brought by EMS and was  stroke alerted. Last known normal was 11 PM last night when he went to bed. Patient woke up this morning and noticed that he was not able to move his right side. BP was 914 systolic. Admits to using cocaine yesterday, also admitted to smoking and  excessive alcohol consumption. He was supposed to be on Plavix however is not taking it regularly. On arrival to [facility] ER, he was flaccid on the right side and had right gaze deviation. A stat CT head was negative. Has not candidate for TPA as he was outside the window and not candidate for mechanical thrombectomy of the ribs no large vessel occlusion on CT angiogram.   Date last known well: 12.30.18 Time last known well: 11 pm tPA Given: no, outside window  NIHSS:  Baseline MRS 0  SUBJECTIVE (INTERVAL HISTORY) No family is at the bedside. Patient is found laying in bed in NAD. Overall he feels his condition is unchanged. Voices no new complaints. No new events reported overnight.  Nurse reports patient recently passed SLP eval with dysphasia 2 diet  Patient reports that he stopped taking all of his medications approximately 1 month ago.      OBJECTIVE Lab Results: CBC:  Recent Labs  Lab 05/04/17 0931 05/04/17 0944 05/05/17 0548  WBC 5.8  --  5.7  HGB 14.6 15.6 14.6  HCT 44.0 46.0 45.3  MCV 92.6  --  93.6  PLT 184  --  187   BMP: Recent Labs  Lab 05/04/17 0931 05/04/17 0944 05/05/17 0548  NA 134* 141 137  K 3.4* 3.2* 3.7  CL 101 103 100*  CO2 24  --  24  GLUCOSE 96 86 67  BUN 6 6 8   CREATININE 1.29* 1.00 1.19  CALCIUM 9.2  --  9.0   Liver Function Tests:  Recent Labs  Lab 05/04/17 0931  AST 23  ALT 11*  ALKPHOS 56  BILITOT 1.4*    PROT 6.7  ALBUMIN 3.5   Coagulation Studies:  Recent Labs    05/04/17 0931  APTT 27  INR 1.03   Urine Drug Screen:     Component Value Date/Time   LABOPIA NONE DETECTED 05/04/2017 1044   COCAINSCRNUR POSITIVE (A) 05/04/2017 1044   LABBENZ NONE DETECTED 05/04/2017 1044   AMPHETMU NONE DETECTED 05/04/2017 1044   THCU NONE DETECTED 05/04/2017 1044   LABBARB NONE DETECTED 05/04/2017 1044    PHYSICAL EXAM Temp:  [98.2 F (36.8 C)-98.8 F (37.1 C)] 98.2 F (36.8 C) (01/01 0500) Pulse Rate:  [59-69] 60 (01/01 0500) Resp:  [16-28] 20 (01/01 0500) BP: (146-182)/(83-107) 167/88 (01/01 0500) SpO2:  [96 %-100 %] 99 % (01/01 0500) Weight:  [68 kg (149 lb 14.6 oz)] 68 kg (149 lb 14.6 oz) (12/31 2036) General - Well nourished, well developed, in no apparent distress HEENT-  Normocephalic, Normal external eye/conjunctiva.  Normal external ears. Normal external nose, mucus membranes and septum.   Cardiovascular - Regular rate and rhythm  Respiratory - Lungs clear bilaterally. No wheezing. Abdomen - soft and non-tender, BS normal Extremities- no edema or cyanosis Neurological Examination Mental Status: Alert, oriented, thought content appropriate.  Speech severely dysarthric. Able to follow 2 step commands without difficulty. Cranial Nerves: II: Likely partial visual  field deficit on left, no blink to threat on left III,IV, VI: forced gaze deviation to right side. pupils equal, round, reactive to light and accommodation.   V,VII: right facial droop, facial light touch sensation normal bilaterally VIII: hearing normal bilaterally IX,X: uvula rises symmetrically XI: bilateral shoulder shrug XII: midline tongue extension Motor: Right :  Upper extremity   0/5                                      Left:     Upper extremity   5/5             Lower extremity   0/5                                                  Lower extremity   5/5 Tone and bulk:normal tone throughout; no atrophy  noted Sensory:  light touch intact throughout, bilaterally Deep Tendon Reflexes: 2+ and symmetric throughout Plantars: Right: downgoing                                Left: downgoing Cerebellar: normal finger-to-nose on left side Gait: unable to walk   IMAGING: I have personally reviewed the radiological images below and agree with the radiology interpretations.  Mr Brain Wo Contrast Result Date: 05/04/2017 IMPRESSION: 1. Acute/early subacute pontine perforator infarction of left lower paramedian pons. No hemorrhage or mass effect. 2. Mild chronic microvascular ischemic changes and mild parenchymal volume loss of the brain. 3. Left inferior cerebellum chronic infarction and small chronic lacunar infarcts of bilateral ganglia. These results will be called to the ordering clinician or representative by the Radiologist Assistant, and communication documented in the PACS or zVision Dashboard. Electronically Signed   By: Kristine Garbe M.D.   On: 05/04/2017 22:58   CTA head, neck and cerebral Perfusion W Contrast Result Date: 05/04/2017 IMPRESSION: Calcific plaque of the internal carotid artery origin bilaterally. 40% diameter stenosis right internal carotid artery, 30% diameter stenosis left internal carotid artery Severe stenosis supraclinoid internal carotid artery on the left. Mild stenosis right M1 segment and moderate stenosis left M1 segment. Moderate stenosis distal vertebral arteries bilaterally. Mild stenosis mid basilar. Negative for emergent large vessel occlusion. Negative CT perfusion   Ct Head Code Stroke Wo Contrast Result Date: 05/04/2017 IMPRESSION: 1. No acute abnormality. Chronic ischemic changes stable from the prior MRI. 2. ASPECTS is 10   Echocardiogram:                                               Study Conclusions - Left ventricle: The cavity size was normal. Wall thickness was   increased in a pattern of mild LVH. Systolic function was normal.   The  estimated ejection fraction was in the range of 60% to 65%.    IMPRESSION: [name] is a 67 y.o. male with PMH of stroke, HTN, cocaine abuse presents with right side weakness and right gaze deviation. Did not receive TPA as he is outside the window CTA negative for LVO.   MRI reveals:  Acute/early subacute pontine perforator infarction of left lower paramedian pons.   Suspected Etiology: Small vessel disease Resultant Symptoms: Right facial droop, right side weakness and right gaze deviation. Stroke Risk Factors: hypertension and smoking Other Stroke Risk Factors: Advanced age, cocaine use, cigarette smoker, ETOH use, Hx stroke  Outstanding Stroke Work-up Studies:    Workup is complete  05/05/2017 ASSESSMENT: Neuro exam remained stable with right facial droop right-sided weakness and right eye gaze deviation.  Stroke workup complete.  Patient encouraged to be compliant with his medications.  Will likely need case management assistance.  Neurology follow-up in 6 weeks.  PLAN  05/05/2017: Continue Aspirin/Plavix [place] Frequent neuro checks Telemetry monitoring PT/OT/SLP Consult Case Management /MSW Ongoing aggressive stroke risk factor management Patient counseled to be compliant with his antithrombotic medications Patient counseled on Lifestyle modifications including, Diet, Exercise, and Stress Follow up with [facility] in 6 weeks  HX OF STROKES: Left inferior cerebellum chronic infarction Small chronic lacunar infarcts of bilateral ganglia.  INTRACRANIAL Atherosclerosis &Stenosis: On DAPT, continue for 3 months and then Plavix alone  DYSPHAGIA: Passes SLP swallow evaluation-dysphasia 2 diet Aspiration Precautions in progress  HYPERTENSION: Stable, some elevated blood pressures noted overnight Permissive hypertension (OK if <220/120) for 24-48 hours post stroke and then gradually normalized within 5-7 days. Long term BP goal normotensive. May  slowly start B/P medications after 48 hours, if indicated Home Meds: NONE  HYPERLIPIDEMIA:    Component Value Date/Time   CHOL 125 05/05/2017 0548   TRIG 106 05/05/2017 0548   HDL 59 05/05/2017 0548   CHOLHDL 2.1 05/05/2017 0548   VLDL 21 05/05/2017 0548   LDLCALC 45 05/05/2017 0548  Home Meds:  NONE LDL  goal < 70 Started on Lipitor to 80 mg daily, for severe ICA stenosis Continue statin at discharge  R/O DIABETES: Lab Results  Component Value Date   HGBA1C 4.6 (L) 05/05/2017  HgbA1c goal < 7.0  TOBACCO ABUSE & POLYSUBSTANCE ABUSE UDS+ Encouraged to stop using cocaine Current smoker Smoking cessation counseling provided Nicotine patch provided  Other Active Problems: Active Problems:   CVA (cerebral vascular accident) [facility])    Hospital day # 1 VTE prophylaxis: Lovenox  Diet : DIET DYS 2 Room service appropriate? Yes; Fluid consistency: Thin   FAMILY UPDATES: No family at bedside  TEAM UPDATES: Annia Belt, MD   Prior Home Stroke Medications:  No antithrombotic  Discharge Stroke Meds:  Please discharge patient on aspirin 325 mg daily and clopidogrel 75 mg daily   Disposition: 06-Home-Health Care Svc Therapy Recs:               PENDING Home Equipment:         PENDING Follow Up:  Follow-up Information    Dennie Bible, NP. Schedule an appointment as soon as possible for a visit in 6 week(s).   Specialty:  Family Medicine Contact information: [address] [place] [address] [phone]          Valinda Party, DO -PCP Follow up in 1-2 weeks    Case Management aware of need for financial and medication assistance   Assessment & plan discussed with with attending physician and they are in agreement.    Renie Ora Stroke Neurology Team 05/05/2017 11:41 AM  ATTENDING NOTE: I reviewed above note and agree with the assessment and plan. I have made any additions or clarifications directly to the  above note. Pt was seen and examined.  67 year old male with history of hypertension, smoker, alcohol abuse, substance abuse, history of a stroke admitted for right-sided weakness and slurred speech.\  He was admitted on 05/09/2015 for right side pontine stroke.  MRI also found to have a remote BG/cerebellum infarcts.  CT head and neck right ICA 60% and left ICA 50%, bilateral siphon, VA, BA stenosis.  EF 50-55%, LDL 52, UDS positive for cocaine.  Put on Zocor and dual antiplatelet for 3 months on discharge.  On this admission, patient had right facial droop, dysarthria and right hemiplegia.  MRI showed left pontine infarct.  CTA head and neck showed 30-40% stenosis of bilateral ICA but severe stenosis of supraclinoid ICA on the left.  Bilateral VA atherosclerosis.  EF 60-65%.  A1c 4.6 and LDL 45.  However, UDS again showed positive of cocaine.  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  Also has intracranial vascular stenosis.  Patient not compliant with medication at home.  Past swallow, and currently put back on dual antiplatelet and high-dose Lipitor.  Recommend continue dual antiplatelet for 3 months and then Plavix alone.  Continue high-dose Lipitor on discharge.  Quit smoking and substance abuse.  Aggressive risk factor modification.  PT/OT recommended SNF.  Neurology will sign off. Please call with questions. Pt will follow up with Cecille Rubin, NP, at [facility] in about 6 weeks. Thanks for the consult.   Rosalin Hawking, MD PhD Stroke Neurology 05/05/2017 2:16 PM  To contact Stroke Continuity provider, please refer to [email or web address] After hours, contact General Neurology

## 2017-05-05 NOTE — Evaluation (Signed)
Clinical/Bedside Swallow Evaluation Patient Details  Name: Andre [name] MRN: [id number] Date of Birth: [birth year]  Today's Date: 05/05/2017 Time: SLP Start Time (ACUTE ONLY): 0800 SLP Stop Time (ACUTE ONLY): 0829 SLP Time Calculation (min) (ACUTE ONLY): 29 min  Past Medical History:  Past Medical History:  Diagnosis Date  . Hypertension   . Stroke [facility])    Past Surgical History:  Past Surgical History:  Procedure Laterality Date  . ABDOMINAL SURGERY     HPI:  Andre [name] is a 67 yo male with PMH significant for HTN, hx of CVA (with residual LLE instability, mixed dysarthria, mild dysphagia, sensory deficits, visual deficits with diplopia, and dizziness), and hx of cocaine use who presents with right sided weakness. Acute/early subacute pontine  infarction of left lower paramedian pons. Pt with a history of mild oral dysphagia, mild pharyngeal dysphagia required multiple swallows.     Assessment / Plan / Recommendation Clinical Impression  Pt demonstrates mild oral dysphagia, though with ability to compensate for missing dentition and right sided lingual labial weakness with use of straw and extra time for mastication. Pt initially tolerated 2 trials of 3 oz water test with no signs of aspiration. After being given solids and then water, pt demonstrates an immediate but effective wet cough with minimal expectoration with awareness and pt use of suction to clear. Current presentation relatively consistent with MBS in 2012 showing mild CP/pharyngeal residuals, though new pontine CVA with current signs of possible worsened dysphagia warrant further testing. Will allow pt to consume a dys 2/thin liquid diet today as cough appear relatively strong and protective, though f/u MBS warranted to ensure further diet toelrance and therapy needs. Will f/u with MBS when radiology scheduling allows.  SLP Visit Diagnosis: Dysphagia, oropharyngeal phase (R13.12)    Aspiration Risk  Moderate aspiration  risk    Diet Recommendation Dysphagia 2 (Fine chop);Thin liquid   Liquid Administration via: Straw Medication Administration: Whole meds with puree Supervision: Patient able to self feed Compensations: Slow rate;Small sips/bites;Multiple dry swallows after each bite/sip Postural Changes: Seated upright at 90 degrees    Other  Recommendations Oral Care Recommendations: Oral care BID   Follow up Recommendations Skilled Nursing facility      Frequency and Duration min 2x/week  2 weeks       Prognosis Prognosis for Safe Diet Advancement: Good      Swallow Study   General HPI: Andre [name] is a 67 yo male with PMH significant for HTN, hx of CVA (with residual LLE instability, mixed dysarthria, mild dysphagia, sensory deficits, visual deficits with diplopia, and dizziness), and hx of cocaine use who presents with right sided weakness. Acute/early subacute pontine  infarction of left lower paramedian pons. Pt with a history of mild oral dysphagia, mild pharyngeal dysphagia required multiple swallows.   Type of Study: Bedside Swallow Evaluation Diet Prior to this Study: NPO Temperature Spikes Noted: No Respiratory Status: Room air History of Recent Intubation: No Behavior/Cognition: Alert;Cooperative;Pleasant mood Oral Cavity Assessment: Within Functional Limits Oral Care Completed by SLP: No Oral Cavity - Dentition: Edentulous Vision: Functional for self-feeding Self-Feeding Abilities: Able to feed self Patient Positioning: Upright in bed Baseline Vocal Quality: Normal Volitional Cough: Strong Volitional Swallow: Able to elicit    Oral/Motor/Sensory Function Overall Oral Motor/Sensory Function: Moderate impairment Facial ROM: Reduced right;Suspected CN VII (facial) dysfunction Facial Symmetry: Abnormal symmetry right;Suspected CN VII (facial) dysfunction Facial Strength: Reduced right;Suspected CN VII (facial) dysfunction Lingual ROM: Reduced right;Suspected CN XII  (hypoglossal) dysfunction  Lingual Symmetry: Abnormal symmetry right;Suspected CN XII (hypoglossal) dysfunction Lingual Strength: Reduced   Ice Chips     Thin Liquid Thin Liquid: Impaired Presentation: Cup;Straw;Self Fed Oral Phase Impairments: Reduced labial seal Pharyngeal  Phase Impairments: Cough - Immediate    Nectar Thick Nectar Thick Liquid: Not tested   Honey Thick Honey Thick Liquid: Not tested   Puree Puree: Within functional limits   Solid   GO   Solid: Impaired Presentation: Self Fed Oral Phase Impairments: Impaired mastication Oral Phase Functional Implications: Prolonged oral transit       Andre Baltimore, MA CCC-SLP [phone]  Andre [name], Andre [name] 05/05/2017,8:48 AM

## 2017-05-05 NOTE — Discharge Summary (Signed)
Name: Andre [name] MRN: [id number] DOB: [birth year] 67 y.o. PCP: Valinda Party, DO  Date of Admission: 05/04/2017  9:18 AM Date of Discharge:  Attending Physician: Annia Belt, MD  Discharge Diagnosis: CVA of left paramedian pons Essential HTN  Discharge Medications: Allergies as of 05/08/2017   No Known Allergies     Medication List    STOP taking these medications   simvastatin 20 MG tablet Commonly known as:  ZOCOR     TAKE these medications   amLODipine 5 MG tablet Commonly known as:  NORVASC Take 1 tablet (5 mg total) by mouth daily.   aspirin 81 MG EC tablet Take 1 tablet (81 mg total) by mouth daily. Start taking on:  05/09/2017   atorvastatin 80 MG tablet Commonly known as:  LIPITOR Take 1 tablet (80 mg total) by mouth daily at 6 PM.   chlorhexidine 0.12 % solution Commonly known as:  PERIDEX Use as directed 5 mLs in the mouth or throat 3 (three) times daily.   clopidogrel 75 MG tablet Commonly known as:  PLAVIX Take 1 tablet (75 mg total) by mouth daily.      Disposition and follow-up:   Mr.Micheal P Dohse was discharged from [facility] in Stable condition.  At the hospital follow up visit please address:  1.  Patient presented with acute R sided facial droop, UE and LE paralysis, and dysarthria. Patient found to have acute L paramedian pontine stroke in the setting of medication noncompliance, as patient was not regularly taking Plavix for hx of his previous stroke. Patient's initial symptoms improved during course and patient discharged to SNF for acute rehab. Neurology recommended dual antiplatelet therapy for 3 months with transition to single-agent Plavix after that time. Please assess for residual R sided weakness and medication compliance.  2.  Labs / imaging needed at time of follow-up: None  3.  Pending labs/ test needing follow-up: None  Follow-up Appointments: Follow-up Information    Dennie Bible, NP. Schedule an appointment as soon as possible for a visit in 6 week(s).   Specialty:  Family Medicine Contact information: [address] [place] [address] [place], [place], DO. Schedule an appointment as soon as possible for a visit in 2 week(s).   Specialty:  Internal Medicine Contact information: [place] [address] [phone]           Hospital Course by problem list: Active Problems:   Benign essential HTN   CVA (cerebral vascular accident) ([place])   Fever   1. CVA of left paramedian pons Mr. Casella was admitted with right sided weakness, right gaze deviation, right facial droop, and dysarthria. Code stroke called. No TPA administered given that he was outside of the window. MRI positive for L pontine stroke with previous history of R pontine stroke. CT perfusion and TTE unremarkable. CT-A head with stenosis in multiple areas. Lipid panel and A1c wnl. Patient was supposed to be on amlodipine, plavix, and simvastatin prior to admission, however he states he was only taking the plavix and only infrequently. He was started on atorvastatin 80mg  daily, as well as aspirin and plavix x3 months for symptomatic intracranial large artery disease. PT/OT consulted and recommended SNF placement.  2. HTN Patient prescribed amlodipine outpatient, however he was not taking this medication prior to admission. This was initially held to allow for permissive HTN in setting of acute CVA.  Amlodipine 5 mg was restarted after 48 hours and his BP was normotensive in the hospital on this regimen. His BP was within normal limits on discharge.  Discharge Vitals:   BP 121/75 (BP Location: Left Arm)   Pulse 71   Temp 98.4 F (36.9 C) (Oral)   Resp 20   Ht 5\' 10"  (1.778 m)   Wt 149 lb 14.6 oz (68 kg)   SpO2 98%   BMI 21.51 kg/m   Pertinent Labs, Studies, and Procedures:  CBC Latest Ref Rng & Units 05/08/2017 05/07/2017  05/05/2017  WBC 4.0 - 10.5 K/uL 5.3 4.4 5.7  Hemoglobin 13.0 - 17.0 g/dL 14.0 15.2 14.6  Hematocrit 39.0 - 52.0 % 44.3 47.2 45.3  Platelets 150 - 400 K/uL 171 167 187   CMP Latest Ref Rng & Units 05/08/2017 05/07/2017 05/05/2017  Glucose 65 - 99 mg/dL 86 120(H) 67  BUN 6 - 20 mg/dL 15 10 8   Creatinine 0.61 - 1.24 mg/dL 1.01 1.09 1.19  Sodium 135 - 145 mmol/L 135 136 137  Potassium 3.5 - 5.1 mmol/L 4.0 3.5 3.7  Chloride 101 - 111 mmol/L 103 103 100(L)  CO2 22 - 32 mmol/L 24 25 24   Calcium 8.9 - 10.3 mg/dL 8.9 9.1 9.0  Total Protein 6.5 - 8.1 g/dL - - -  Total Bilirubin 0.3 - 1.2 mg/dL - - -  Alkaline Phos 38 - 126 U/L - - -  AST 15 - 41 U/L - - -  ALT 17 - 63 U/L - - -   HbA1c 4.6 Chol 125, TG 106, HDL 59, LDL 45 UDS positive for cocaine UA negative Ethyl alcohol <10 Troponin negative INR 1.03  CT head 05/04/2017 1. No acute abnormality. Chronic ischemic changes stable from the prior MRI. 2. ASPECTS is 10  CT-A head/neck & CT perfusion study 05/04/2017 - Calcific plaque of the internal carotid artery origin bilaterally. 40% diameter stenosis right internal carotid artery, 30% diameter stenosis left internal carotid artery - Severe stenosis supraclinoid internal carotid artery on the left. Mild stenosis right M1 segment and moderate stenosis left M1 segment. - Moderate stenosis distal vertebral arteries bilaterally. Mild stenosis mid basilar. - Negative for emergent large vessel occlusion. - Negative CT perfusion  CXR 05/04/2017 No edema or consolidation. Heart mildly enlarged, stable. No evident adenopathy.  MRI brain 05/04/2017 1. Acute/early subacute pontine perforator infarction of left lower paramedian pons. No hemorrhage or mass effect. 2. Mild chronic microvascular ischemic changes and mild parenchymal volume loss of the brain. 3. Left inferior cerebellum chronic infarction and small chronic lacunar infarcts of bilateral ganglia.  TTE 05/04/2017 - Left ventricle: The  cavity size was normal. Wall thickness was   increased in a pattern of mild LVH. Systolic function was normal.   The estimated ejection fraction was in the range of 60% to 65%.  Discharge Instructions: Discharge Instructions    Ambulatory referral to Neurology   Complete by:  As directed    Follow up with stroke clinic Cecille Rubin preferred, if not available, then consider Caesar Chestnut, [facility] or Jaynee Eagles whoever is available) at [facility] in about 6-8 weeks. Thanks.   Call MD for:  difficulty breathing, headache or visual disturbances   Complete by:  As directed    Call MD for:  persistant nausea and vomiting   Complete by:  As directed    Call MD for:  temperature >100.4   Complete by:  As directed    Diet - low sodium heart healthy  Complete by:  As directed    Discharge instructions   Complete by:  As directed    You were seen for an acute stroke. You will need to take both aspiring 81 mg and plavix 75 mg daily for the next 3 months. You will then only need to take Plavix 75 mg daily. You will also need to take a medication to lower your cholesterol. This medication is called atorvastatin (Lipitor). You will take 80 mg every day. Please follow up with your primary care physician after your hospital discharge. You can make an appointment in the internal medicine clinic at [facility] within 1-2 weeks. These physicians can help with the above medications, as well as your medications for blood pressure. You are also to follow up with the neurologist who saw you while you were in the hospital. Please make an appointment to see them 6 weeks after discharge.   Increase activity slowly   Complete by:  As directed      Signed: Thomasene Ripple, MD 05/08/2017, 11:56 AM

## 2017-05-05 NOTE — Progress Notes (Signed)
Subjective:  Andre [name] was lying in bed comfortably this morning. He states that he is able to move his right side a little better today and that his speaking is slightly improved. Denies pain or shortness of breath. Denies headache. States he has some children living in [place], [place].  Objective:  Vital signs in last 24 hours: Vitals:   05/04/17 1930 05/04/17 2036 05/05/17 0222 05/05/17 0500  BP: (!) 174/101 (!) 164/93 (!) 182/98 (!) 167/88  Pulse: 65 66 62 60  Resp: (!) 25 20 20 20   Temp:  98.2 F (36.8 C) 98.8 F (37.1 C) 98.2 F (36.8 C)  TempSrc:  Oral Oral Oral  SpO2: 98% 99%  99%  Weight:  149 lb 14.6 oz (68 kg)    Height:  5\' 10"  (1.778 m)     GEN: Elderly male lying in bed in NAD. Alert and oriented x3. EYES: Right gaze deviation. PERRL. Arcus senilis bilaterally. RESP: Decreased breath sounds diffusely. No wheezes, rales, or rhonchi. No increased work of breathing. CV: Normal rate and regular rhythm. No murmurs, gallops, or rubs. No carotid bruits. No LE edema. ABD: Soft. Non-tender. Non-distended. Normoactive bowel sounds. EXT: No edema. Warm and well perfused. NEURO: Right gaze deviation; Left homonymous hemianopsia; Right facial droop; decreased right facial sensation; decreased hearing on right; unable to shrug right shoulder. Able to lift RLE against gravity (mostly at hip flexor). Diminished reflexes on RLE. Flaccid paralysis on RUE and RLE. 4/5 LUE and LLE strength. Slurred speech. PSYCH: Patient is calm and pleasant. Appropriate affect. Well-groomed; speech is appropriate and on-subject.  Labs CBC Latest Ref Rng & Units 05/05/2017 05/04/2017 05/04/2017  WBC 4.0 - 10.5 K/uL 5.7 - 5.8  Hemoglobin 13.0 - 17.0 g/dL 14.6 15.6 14.6  Hematocrit 39.0 - 52.0 % 45.3 46.0 44.0  Platelets 150 - 400 K/uL 187 - 184   CMP Latest Ref Rng & Units 05/05/2017 05/04/2017 05/04/2017  Glucose 65 - 99 mg/dL 67 86 96  BUN 6 - 20 mg/dL 8 6 6   Creatinine 0.61 - 1.24 mg/dL 1.19 1.00  1.29(H)  Sodium 135 - 145 mmol/L 137 141 134(L)  Potassium 3.5 - 5.1 mmol/L 3.7 3.2(L) 3.4(L)  Chloride 101 - 111 mmol/L 100(L) 103 101  CO2 22 - 32 mmol/L 24 - 24  Calcium 8.9 - 10.3 mg/dL 9.0 - 9.2  Total Protein 6.5 - 8.1 g/dL - - 6.7  Total Bilirubin 0.3 - 1.2 mg/dL - - 1.4(H)  Alkaline Phos 38 - 126 U/L - - 56  AST 15 - 41 U/L - - 23  ALT 17 - 63 U/L - - 11(L)   Chol 125, TG 106, HDL 59, LDL 45 HbA1c 4.6 HIV Ab screen pending  MRI 05/04/2017 1. Acute/early subacute pontine perforator infarction of left lower paramedian pons. No hemorrhage or mass effect. 2. Mild chronic microvascular ischemic changes and mild parenchymal volume loss of the brain. 3. Left inferior cerebellum chronic infarction and small chronic lacunar infarcts of bilateral ganglia.  Assessment/Plan:  Active Problems:   CVA (cerebral vascular accident) [facility])  Mr. Beem is a 67yo male with PMH significant for HTN, hx of strokes, and hx of cocaine use who presents with right sided weakness and slurred speech since 11pm last night. Code stroke called, no TPA given secondary to CT head negative for large vessel occlusion and outside window. MRI positive for acute/early subacute pontine infarct at L lower paramedian pons.  CVA of left lower paramedian pons, neuro deficits include  Right sided hemiplegia, R facial droop, and slurred speech In setting of HTN and cocaine use, not currently on medications except Plavix, which he states he does not take every day. A1c and lipid panel wnl. BP mildly elevated 140s-150s/80s-90s. CT head without acute CVA and CT-A shows multiple areas of stenosis. CT head perfusion study negative. MRI with acute/early subacute pontine infarct of L lower paramedian pons. Echo with LVEF 60-65% and mild LVH. He has a little bit of improvement in his right side strength, but still severe deficits. The most important thing at this point will be getting PT/OT rehab to work with him and speech swallow  eval. He will likely need placement in a facility for rehab and will contact family to update them and see if he can get support from them when he does go home. Will start him on aspirin and plavix for 3 months given symptomatic intracranial large artery disease (SAMMPRIS trial) and patient's high risk of stroke. - Neurology consulted; appreciate their recommendations - Telemetry - Neuro checks - F/u PT/OT eval - F/u speech swallow eval - NPO until speech swallow eval - Allowing for permissive HTN, goal sBP <210 - Atorvastatin 80mg  daily - Aspirin 325mg  daily and Plavix 75mg  daily  AKI, resolved UA negative. Cr 1.29 -> 1.19 (baseline ~1.1-1.3), resolved after IV fluids. - Continue to monitor  Hx of HTN BP 167-182/88-98. HR in the 60s. Home regimen includes amlodipine 5mg  daily, which patient had not been taking prior to admission. - Hold home amlodipine to allow for permissive HTN  Hx of cocaine use UDS positive for cocaine. Last used 12/30 PM.  Alcohol use disorder Drinks 2-3 40oz beers per day. Last drink 12/30 PM. - CIWA with Ativan - Thiamine, folate - MVI  Dispo: Anticipated discharge in approximately 2-3 day(s).  Colbert Ewing, MD 05/05/2017, 10:03 AM Pager: Mamie Nick [phone]

## 2017-05-05 NOTE — Evaluation (Signed)
Occupational Therapy Evaluation Patient Details Name: Andre [name] MRN: [id number] DOB: 1951/05/01 Today's Date: 05/05/2017    History of Present Illness Andre [name] is an 67 y.o. male with PMH of stroke, HTN, cocaine abuse presents with right side weakness and right gaze deviation.  MRI positive for L pontine stroke with previous h/o R pontine stroke.   Clinical Impression   PTA, pt reports he was independent with ADLs and living "here or there." Pt currently requiring Max A for dressing, bathing, and toileting. Pt requiring Max A +2 for functional transfers. Pt presenting with decreased cognition, vision, balance, and R hemiparesis. Pt would benefit form further acute OT to facilitate safe dc. Recommend dc to SNF for further OT to increase safety and independence with ADLs and functional mobility.     Follow Up Recommendations  SNF;Supervision/Assistance - 24 hour    Equipment Recommendations  Other (comment)(Defer to next venue)    Recommendations for Other Services PT consult     Precautions / Restrictions Precautions Precautions: Fall Precaution Comments: visual deficits Restrictions Weight Bearing Restrictions: No      Mobility Bed Mobility Overal bed mobility: Needs Assistance Bed Mobility: Rolling;Sidelying to Sit Rolling: Max assist Sidelying to sit: Max assist;+2 for physical assistance       General bed mobility comments: cues for technique, assist to reach with L arm to R railing and to manage legs and lift trunk  Transfers Overall transfer level: Needs assistance Equipment used: 2 person hand held assist Transfers: Sit to/from [employer] Sit to Stand: +2 physical assistance;Max assist   Squat pivot transfers: Max assist     General transfer comment: Patient able to stand with lifting help and assist to stabilize R LE, unable to maintain erect posture despite facilitation and handling of trunk and cues for forward gaze.  Stood  about 30 sec, then pivot to L to recliner with +1 max A for lifting and lowering and cues for hand placement and balance    Balance Overall balance assessment: Needs assistance   Sitting balance-[name]: Poor Sitting balance - Comments: leans to R with heavy arm; reaching for feet to don socks with max A   Standing balance support: Bilateral upper extremity supported Standing balance-[name]: Zero Standing balance comment: +2 A for balance in standing                           ADL either performed or assessed with clinical judgement   ADL Overall ADL's : Needs assistance/impaired Eating/Feeding: Minimal assistance;Sitting Eating/Feeding Details (indicate cue type and reason): Pt using L hand and requring assistance for set up and to open containers. Able to bring spoon to mouth with LUE Grooming: Wash/dry face;Set up;Sitting Grooming Details (indicate cue type and reason): supported sitting in recliner. Using LUE to wash face with cool washclothe Upper Body Bathing: Maximal assistance;Sitting   Lower Body Bathing: Maximal assistance;Sit to/from stand;+2 for physical assistance   Upper Body Dressing : Maximal assistance;Sitting Upper Body Dressing Details (indicate cue type and reason): Donning new gown. Pt able to lift RUE with L hand while OT donned sleeve over RUE. Pt then able to put LUE into L sleeve. Requiring assistance for sitting balance at EOB Lower Body Dressing: Maximal assistance;Sit to/from stand;+2 for physical assistance Lower Body Dressing Details (indicate cue type and reason): Max A +2 for sit<>stand. Pt donning socks at EOB with assistance to don socks over toes. Pt then abel  to lean forward and pull socks over heels with Mod A for sitting balance and additional assistance to pull over L heel.  Toilet Transfer: Maximal assistance;Squat-pivot;+2 for safety/equipment(Simulated to drop arm recliner; moving to L)           Functional mobility during  ADLs: Maximal assistance;+2 for safety/equipment(sit<>stand and squat pivot only) General ADL Comments: Pt demonstrating decreased fucnitonal performance and is imapcted by no fucntional use of RUE (dominant hand), vision deficits, and poor sitting/standing balance.     Vision Baseline Vision/History: (Diplopia and L visual field deficits) Patient Visual Report: Diplopia(horizontal) Vision Assessment?: Yes;Vision impaired- to be further tested in functional context Ocular Range of Motion: Restricted on the left;Other (comment)(No active movement toward left visual field) Alignment/Gaze Preference: Head turned;Gaze right(Head turned to left, gaze preference to right) Tracking/Visual Pursuits: Decreased smoothness of eye movement to LEFT inferior field;Decreased smoothness of eye movement to LEFT superior field;Impaired - to be further tested in functional context;Decreased smoothness of horizontal tracking Diplopia Assessment: Other (comment)(Reporting diplopia "side by side") Additional Comments: R eye dominant. Will further assess. No tracking to L side of visual field past midline for both eyes     Perception     Praxis      Pertinent Vitals/Pain Pain Assessment: No/denies pain     Hand Dominance Right   Extremity/Trunk Assessment Upper Extremity Assessment Upper Extremity Assessment: LUE deficits/detail;RUE deficits/detail RUE Deficits / Details: Flaccid with brunstrom stage of 1 throughout RUE RUE Coordination: decreased fine motor;decreased gross motor LUE Deficits / Details: Decreased grasp strength, FM skills, and coordination LUE Coordination: decreased fine motor;decreased gross motor   Lower Extremity Assessment Lower Extremity Assessment: Defer to PT evaluation RLE Deficits / Details: AROM WFl, strength hip flexion 4-/5, knee extension 4/5, ankle DF 4/5 LLE Deficits / Details: unable to activate antigravity, but with weight bearing activates some, but unable to sustain    Cervical / Trunk Assessment Cervical / Trunk Assessment: Other exceptions Cervical / Trunk Exceptions: cervical flexion, R lateral and rotation; posterior pelvic tilt with elongation on R   Communication Communication Communication: Expressive difficulties(dysarthria)   Cognition Arousal/Alertness: Awake/alert Behavior During Therapy: WFL for tasks assessed/performed Overall Cognitive Status: No family/caregiver present to determine baseline cognitive functioning                                 General Comments: oriented, but limited insight and awarness   General Comments  BP w/OOB to chair 138/90    Exercises     Shoulder Instructions      Home Living Family/patient expects to be discharged to:: Unsure     Type of Home: Homeless                           Additional Comments: states he lived with a lady, then she took his name off the lease so he has been staying different places  Lives With: Alone    Prior Functioning/Environment Level of Independence: Independent        Comments: Use of cane for functional mobility. Reports independence with ADLs         OT Problem List: Decreased strength;Decreased range of motion;Decreased activity tolerance;Impaired vision/perception;Impaired balance (sitting and/or standing);Decreased safety awareness;Decreased knowledge of use of DME or AE;Decreased knowledge of precautions;Impaired UE functional use;Pain      OT Treatment/Interventions: Self-care/ADL training;Therapeutic exercise;Energy conservation;DME and/or AE instruction;Therapeutic activities;Patient/family education  OT Goals(Current goals can be found in the care plan section) Acute Rehab OT Goals Patient Stated Goal: To get some rehab OT Goal Formulation: With patient Time For Goal Achievement: 05/19/17 Potential to Achieve Goals: Good ADL Goals Pt Will Perform Grooming: (P) with min assist;sitting(Using RUE as dependent assist) Pt  Will Perform Upper Body Dressing: (P) with min assist;sitting Pt Will Perform Lower Body Dressing: (P) sit to/from stand;with mod assist Pt Will Transfer to Toilet: (P) with mod assist;stand pivot transfer;bedside commode Additional ADL Goal #1: (P) Pt will use RUE as dependent assist for 75% of an ADL task. Additional ADL Goal #2: (P) Pt will attend to ADLs items in L visual field with Min cues  OT Frequency: Min 2X/week   Barriers to D/C:            Co-evaluation PT/OT/SLP Co-Evaluation/Treatment: Yes Reason for Co-Treatment: For patient/therapist safety PT goals addressed during session: Mobility/safety with mobility;Balance;Strengthening/ROM OT goals addressed during session: ADL's and self-care      AM-PAC PT "6 Clicks" Daily Activity     Outcome Measure Help from another person eating meals?: A Little Help from another person taking care of personal grooming?: A Little Help from another person toileting, which includes using toliet, bedpan, or urinal?: Total Help from another person bathing (including washing, rinsing, drying)?: A Lot Help from another person to put on and taking off regular upper body clothing?: A Lot Help from another person to put on and taking off regular lower body clothing?: A Lot 6 Click Score: 13   End of Session Equipment Utilized During Treatment: Gait belt Nurse Communication: Mobility status  Activity Tolerance: Patient tolerated treatment well;Patient limited by fatigue Patient left: in chair;with call bell/phone within reach;with chair alarm set  OT Visit Diagnosis: Unsteadiness on feet (R26.81);Other abnormalities of gait and mobility (R26.89);Muscle weakness (generalized) (M62.81);Other symptoms and signs involving cognitive function;Low vision, both eyes (H54.2);Hemiplegia and hemiparesis Hemiplegia - Right/Left: Right Hemiplegia - dominant/non-dominant: Dominant Hemiplegia - caused by: Cerebral infarction                Time:  0240-9735 OT Time Calculation (min): 24 min Charges:  OT General Charges $OT Visit: 1 Visit OT Evaluation $OT Eval Moderate Complexity: 1 Mod G-Codes:     [place] MSOT, OTR/L Acute Rehab Pager: [phone] Office: [place] 05/05/2017, 4:27 PM

## 2017-05-05 NOTE — Evaluation (Signed)
Speech Language Pathology Evaluation Patient Details Name: Andre [name] MRN: [id number] DOB: 06-09-1950 Today's Date: 05/05/2017 Time: 0800-0829 SLP Time Calculation (min) (ACUTE ONLY): 29 min  Problem List:  Patient Active Problem List   Diagnosis Date Noted  . CVA (cerebral vascular accident) ([place]) 05/04/2017  . Callus 09/06/2015  . Healthcare maintenance 09/06/2015  . Essential hypertension 05/09/2015  . History of CVA (cerebrovascular accident) 05/09/2015  . Tobacco use disorder   . HLD (hyperlipidemia)   . Alcohol abuse   . Polysubstance abuse ([place]) 03/31/2011   Past Medical History:  Past Medical History:  Diagnosis Date  . Hypertension   . Stroke [facility])    Past Surgical History:  Past Surgical History:  Procedure Laterality Date  . ABDOMINAL SURGERY     HPI:  Andre [name] is a 67 yo male with PMH significant for HTN, hx of CVA (with residual LLE instability, mixed dysarthria, mild dysphagia, sensory deficits, visual deficits with diplopia, and dizziness), and hx of cocaine use who presents with right sided weakness. Acute/early subacute pontine  infarction of left lower paramedian pons. Pt with a history of mild oral dysphagia, mild pharyngeal dysphagia required multiple swallows.     Assessment / Plan / Recommendation Clinical Impression  Pt demonstrates moderate dysarthria secondary to right CN VII and XII weakness. Pt also unable to demonstrate delayed recall of 2/3 words despite cues. Memory deficits likely more related to baseline cognitive function. Pt also reports he is unable to read at baseline, but can identify/name picutres in central visual field despite right gaze preference. Briefly instructed pt in speech intelligibility strategies with successful pt demonstration. Will continue efforts. Pt will need f/u SLP services, recomment SNF level care.     SLP Assessment  SLP Recommendation/Assessment: Patient needs continued Speech Lanaguage Pathology  Services SLP Visit Diagnosis: Dysarthria and anarthria (R47.1)    Follow Up Recommendations  Skilled Nursing facility    Frequency and Duration min 2x/week  2 weeks      SLP Evaluation Cognition  Overall Cognitive Status: No family/caregiver present to determine baseline cognitive functioning Arousal/Alertness: Awake/alert Orientation Level: Oriented X4 Attention: Alternating Alternating Attention: Appears intact Memory: Impaired Memory Impairment: Storage deficit Awareness: Appears intact Problem Solving: Appears intact Safety/Judgment: Appears intact       Comprehension  Auditory Comprehension Overall Auditory Comprehension: Appears within functional limits for tasks assessed Reading Comprehension Reading Status: (unable to read at baseline)    Expression Expression Primary Mode of Expression: Verbal Verbal Expression Overall Verbal Expression: Appears within functional limits for tasks assessed   Oral / Motor  Oral Motor/Sensory Function Overall Oral Motor/Sensory Function: Moderate impairment Facial ROM: Reduced right;Suspected CN VII (facial) dysfunction Facial Symmetry: Abnormal symmetry right;Suspected CN VII (facial) dysfunction Facial Strength: Reduced right;Suspected CN VII (facial) dysfunction Lingual ROM: Reduced right;Suspected CN XII (hypoglossal) dysfunction Lingual Symmetry: Abnormal symmetry right;Suspected CN XII (hypoglossal) dysfunction Lingual Strength: Reduced Motor Speech Overall Motor Speech: Impaired Respiration: Within functional limits Phonation: Normal Resonance: Within functional limits Articulation: Impaired Level of Impairment: Word Intelligibility: Intelligibility reduced Word: 75-100% accurate Phrase: 50-74% accurate Sentence: 50-74% accurate Conversation: 50-74% accurate Motor Planning: Witnin functional limits Motor Speech Errors: Aware;Consistent Interfering Components: Premorbid status;Inadequate dentition Effective  Techniques: Slow rate;Increased vocal intensity;Over-articulate;Pause   GO                    Herbie Baltimore, MA CCC-SLP [phone]  Lynann Beaver 05/05/2017, 8:38 AM

## 2017-05-05 NOTE — Evaluation (Signed)
Physical Therapy Evaluation Patient Details Name: Andre [name] MRN: [id number] DOB: Nov 14, 1950 Today's Date: 05/05/2017   History of Present Illness  Andre [name] is an 67 y.o. male with PMH of stroke, HTN, cocaine abuse presents with right side weakness and right gaze deviation.  MRI positive for L pontine stroke with previous h/o R pontine stroke.  Clinical Impression  Patient presents with decreased independence with mobility due to deficits listed in PT problem list.  Currently pt requiring +2 A for OOB activity with decreased sitting balance, standing balance. R hemiparesis and dizziness, generalized weakness all limiting mobility.  Recommend SNF level rehab at d/c.  PT to follow acutely.     Follow Up Recommendations SNF;Supervision/Assistance - 24 hour    Equipment Recommendations  Other (comment)(TBA at next venue)    Recommendations for Other Services       Precautions / Restrictions Precautions Precautions: Fall Precaution Comments: visual deficits      Mobility  Bed Mobility Overal bed mobility: Needs Assistance Bed Mobility: Rolling;Sidelying to Sit Rolling: Max assist Sidelying to sit: Max assist;+2 for physical assistance       General bed mobility comments: cues for technique, assist to reach with L arm to R railing and to manage legs and lift trunk  Transfers Overall transfer level: Needs assistance Equipment used: 2 person hand held assist Transfers: Sit to/from [employer] Sit to Stand: +2 physical assistance;Max assist   Squat pivot transfers: Max assist     General transfer comment: Patient able to stand with lifting help and assist to stabilize R LE, unable to maintain erect posture despite facilitation and handling of trunk and cues for forward gaze.  Stood about 30 sec, then pivot to L to recliner with +1 max A for lifting and lowering and cues for hand placement and balance  Ambulation/Gait             General  Gait Details: unable  Stairs            Wheelchair Mobility    Modified Rankin (Stroke Patients Only)       Balance Overall balance assessment: Needs assistance   Sitting balance-[name]: Poor Sitting balance - Comments: leans to R with heavy arm; reaching for feet to don socks with max A   Standing balance support: Bilateral upper extremity supported Standing balance-[name]: Zero Standing balance comment: +2 A for balance in standing                             Pertinent Vitals/Pain Pain Assessment: No/denies pain    Home Living       Type of Home: Homeless           Additional Comments: states he lived with a lady, then she took his name off the lease so he has been staying different places    Prior Function Level of Independence: Independent               Hand Dominance   Dominant Hand: Right    Extremity/Trunk Assessment   Upper Extremity Assessment Upper Extremity Assessment: Defer to OT evaluation    Lower Extremity Assessment Lower Extremity Assessment: RLE deficits/detail;LLE deficits/detail RLE Deficits / Details: AROM WFl, strength hip flexion 4-/5, knee extension 4/5, ankle DF 4/5 LLE Deficits / Details: unable to activate antigravity, but with weight bearing activates some, but unable to sustain    Cervical / Trunk Assessment Cervical /  Trunk Assessment: Other exceptions Cervical / Trunk Exceptions: cervical flexion, R lateral and rotation; posterior pelvic tilt with elongation on R  Communication   Communication: Expressive difficulties(dysarthria)  Cognition Arousal/Alertness: Awake/alert Behavior During Therapy: WFL for tasks assessed/performed Overall Cognitive Status: No family/caregiver present to determine baseline cognitive functioning                                 General Comments: oriented, but limited insight       General Comments General comments (skin integrity, edema,  etc.): BP w/OOB to chair 138/90    Exercises     Assessment/Plan    PT Assessment Patient needs continued PT services  PT Problem List Decreased balance;Decreased mobility;Decreased knowledge of precautions;Decreased safety awareness;Decreased activity tolerance;Decreased knowledge of use of DME;Decreased cognition;Decreased strength       PT Treatment Interventions DME instruction;Balance training;Gait training;Functional mobility training;Patient/family education;Therapeutic activities;Therapeutic exercise;Wheelchair mobility training    PT Goals (Current goals can be found in the Care Plan section)  Acute Rehab PT Goals Patient Stated Goal: To get some rehab PT Goal Formulation: With patient Time For Goal Achievement: 05/19/17 Potential to Achieve Goals: Fair    Frequency Min 3X/week   Barriers to discharge Decreased caregiver support;Other (comment) homelessness    Co-evaluation PT/OT/SLP Co-Evaluation/Treatment: Yes Reason for Co-Treatment: For patient/therapist safety;To address functional/ADL transfers PT goals addressed during session: Mobility/safety with mobility;Balance;Strengthening/ROM         AM-PAC PT "6 Clicks" Daily Activity  Outcome Measure Difficulty turning over in bed (including adjusting bedclothes, sheets and blankets)?: Unable Difficulty moving from lying on back to sitting on the side of the bed? : Unable Difficulty sitting down on and standing up from a chair with arms (e.g., wheelchair, bedside commode, etc,.)?: Unable Help needed moving to and from a bed to chair (including a wheelchair)?: A Lot Help needed walking in hospital room?: Total Help needed climbing 3-5 steps with a railing? : Total 6 Click Score: 7    End of Session Equipment Utilized During Treatment: Gait belt Activity Tolerance: Patient limited by fatigue Patient left: in chair;with chair alarm set;with call bell/phone within reach   PT Visit Diagnosis: Other abnormalities  of gait and mobility (R26.89);Hemiplegia and hemiparesis Hemiplegia - Right/Left: Right Hemiplegia - dominant/non-dominant: Dominant Hemiplegia - caused by: Cerebral infarction    Time: 2703-5009 PT Time Calculation (min) (ACUTE ONLY): 24 min   Charges:   PT Evaluation $PT Eval High Complexity: 1 High     PT G CodesMagda Kiel, [place] [phone] 05/05/2017   Reginia Naas 05/05/2017, 12:59 PM

## 2017-05-06 ENCOUNTER — Inpatient Hospital Stay (HOSPITAL_COMMUNITY): Payer: Medicare Other

## 2017-05-06 DIAGNOSIS — Z66 Do not resuscitate: Secondary | ICD-10-CM

## 2017-05-06 DIAGNOSIS — F1490 Cocaine use, unspecified, uncomplicated: Secondary | ICD-10-CM

## 2017-05-06 DIAGNOSIS — I69354 Hemiplegia and hemiparesis following cerebral infarction affecting left non-dominant side: Secondary | ICD-10-CM

## 2017-05-06 DIAGNOSIS — F1099 Alcohol use, unspecified with unspecified alcohol-induced disorder: Secondary | ICD-10-CM

## 2017-05-06 DIAGNOSIS — K148 Other diseases of tongue: Secondary | ICD-10-CM

## 2017-05-06 DIAGNOSIS — I6329 Cerebral infarction due to unspecified occlusion or stenosis of other precerebral arteries: Secondary | ICD-10-CM

## 2017-05-06 DIAGNOSIS — R1311 Dysphagia, oral phase: Secondary | ICD-10-CM

## 2017-05-06 LAB — HIV ANTIBODY (ROUTINE TESTING W REFLEX): HIV SCREEN 4TH GENERATION: NONREACTIVE

## 2017-05-06 MED ORDER — CHLORHEXIDINE GLUCONATE 0.12 % MT SOLN
5.0000 mL | Freq: Three times a day (TID) | OROMUCOSAL | Status: DC
  Administered 2017-05-06 – 2017-05-08 (×5): 5 mL via OROMUCOSAL
  Filled 2017-05-06 (×5): qty 15

## 2017-05-06 MED ORDER — ASPIRIN EC 81 MG PO TBEC
81.0000 mg | DELAYED_RELEASE_TABLET | Freq: Every day | ORAL | Status: DC
  Administered 2017-05-07 – 2017-05-08 (×2): 81 mg via ORAL
  Filled 2017-05-06 (×2): qty 1

## 2017-05-06 NOTE — Progress Notes (Signed)
I examined this patient today together with resident physician Dr. Thomasene Ripple and I concur with her evaluation and management plan which we discussed together. 2 days out from major pontine stroke with dense right hemiplegia and mild residual left-sided weakness from previous strokes.  He is showing some signs of early improvement.  He has trace movement of his right upper and right lower extremity.  Primarily proximal strength and he is using his hip flexors to help elevate his right leg.  Antihypertensives will be resumed at this time.  He is out of the window for permissive hypertension. We will continue dual antiplatelet agents for 3 months then single agent Plavix.  Statin resumed. Rehabilitation in the setting of a skilled nursing facility recommended by our physical therapist. Swallowing study with mild oral dysphagia.  Dysphagia 3 mechanical soft diet with thin liquids recommended. He has a bacterial overgrowth on the dorsum of his tongue.  We will initiate 3 times daily Peridex gargles. Since morning rounds, he is spiked a temperature of 101.2.  High aspiration risk.  We will need to culture and start antibiotics if fevers persist.

## 2017-05-06 NOTE — Progress Notes (Signed)
   05/06/17 1000  Clinical Encounter Type  Visited With Patient  Spiritual Encounters  Spiritual Needs Prayer  Chaplain visited with the PT, the PT was sleeping but Chaplain did say a prayer for the PT.  Chaplain will revisit later in the day today to talk with the PT.

## 2017-05-06 NOTE — Progress Notes (Signed)
Subjective:  Patient seen laying comfortably in bed in no acute distress. He was difficult to understand during exam, but reportedly improved from previous days. Patient also notices he is able to move his R leg and arm more than on previous days. Patient encouraged by changes and wondering when he can leave hospital for SNF.   Objective:  Vital signs in last 24 hours: Vitals:   05/05/17 1447 05/05/17 2102 05/06/17 0122 05/06/17 0541  BP: 137/76 (!) 159/81 (!) 149/79 (!) 158/84  Pulse: 85 91 87 75  Resp: 17 18 20 20   Temp:  99.6 F (37.6 C) 99.3 F (37.4 C) 99.5 F (37.5 C)  TempSrc:  Oral Oral Oral  SpO2: 98% 99% 99% 99%  Weight:      Height:       Physical Exam  Constitutional: He appears well-developed and well-nourished. No distress.  Eyes: Conjunctivae and EOM are normal. Right eye exhibits no discharge. Left eye exhibits no discharge.  Cardiovascular: Normal rate, regular rhythm and intact distal pulses.  No murmur heard. Respiratory: Effort normal. No respiratory distress. He has no wheezes.  No crackles appreciated.  GI: Soft. He exhibits no distension. There is no tenderness.  Musculoskeletal: He exhibits no edema (of bilateral lower extremities) or tenderness (of bilateral lower extremities).  Neurological:  Smile asymmetric. Tongue deviated to left. Sensation to touch of R face decreased compared to Left. 4/5 grip, bicep, tricep strength on LUE. 3/5 RUE bicep strength, 2+ grip strength. 4/5 hip flexion, dorsiflexion and plantar flexion of LLE. 3/5 hip flexion of RLE, 0-1/5 dorsiflexion and plantar flexion of RLE.   Skin: Skin is warm and dry. No rash noted. He is not diaphoretic. No erythema.   Assessment/Plan:  Active Problems:   Benign essential HTN   CVA (cerebral vascular accident) [facility])  Mr. Enke is a 67yo male with PMH significant for HTN, hx of strokes, and hx of cocaine use who presents with right sided weakness and slurred speech since 11pm last  night. Code stroke called, no TPA given secondary to CT head negative for large vessel occlusion and outside window. MRI positive for acute/early subacute pontine infarct at L lower paramedian pons.  CVA of left lower paramedian pons, with hx of previous CVA: Neuro deficits on presentation include Right sided hemiplegia, R facial droop, and slurred speech. In setting of HTN, cocaine use, and medication noncompliance for previous CVA. On this admission, A1c and lipid panel within normal limits. CT head without acute CVA and CT-A shows multiple areas of stenosis. MRI with acute/early subacute pontine infarct of L lower paramedian pons. Echo with LVEF 60-65% and mild LVH. PT/OT saw patient yesterday and recommended SNF for acute rehab given significant neuro defects. SW consulted.  -Neurology signed off, will follow as outpatient -Atorvastatin 80mg  daily -Aspirin 81 mg and Plavix 75mg  daily -Telemetry -Neuro checks -SW consulted, assistance appreciated  AKI: Cr 1.29 on admission, returned to baseline 1.2-1.3 with IVF.  -Continue to monitor  Hx of HTN: BP 150s-160s/80s overnight. Home regimen includes amlodipine 5mg  daily, which patient had not been taking prior to admission. -Add home amlodipine 5 mg now that out of window for permissive hypertension  Hx of cocaine use and alcohol use disorder: UDS positive for cocaine on admission. Last used 12/30 PM, likely contributing to HTN. Patient also drinks 2-3 40oz beers per day. -CIWA with Ativan -Thiamine, folate, daily multivitamin  FEN/GI: -Dysphagia III, per speech recommendations -No IVF, replace electrolytes as needed  VTE Prophylaxis:  Lovenox Code Status: DNR  Dispo: Anticipated discharge in approximately 2-3 day(s).  Thomasene Ripple, MD 05/06/2017, 7:35 AM Pager: Mamie Nick [phone]

## 2017-05-06 NOTE — Progress Notes (Signed)
Modified Barium Swallow Progress Note  Patient Details  Name: Andre [name] MRN: [id number] Date of Birth: [birth year]  Today's Date: 05/06/2017  Modified Barium Swallow completed.  Full report located under Chart Review in the Imaging Section.  Brief recommendations include the following:  Clinical Impression  Pt demonstrates minimal functional difference in prior MBS in 2012. He presents with a mild oral dysphagia secondary to right sided labial and liangual weakenss as well as base of tongue weakness. Pt is able to compensate for lateral weakenss by placing bolus on the left side of mouth, as well as completing a second swallow to clear majority of oral residuals. There is somewhat passive bolus formationa nd transit with spillage of bolus to pyriforms with biref delay in swallow initiation. Hyoid excursion, pharyngeal contraciton and base of tongue contraction are all mildly weak leaving moderate vallecular and pyriform residuals post swallow. Cues for two effortful swallows are effective in improving pharyngeal peristalsis and bolus transit. Esophageal sweep appeared adequate. Post test pt did have some wet coughing when laying down. Expect pt likely to have some sensed aspiration events given impairments, but cough appears protective. Recommend dys 3 mech soft diet with thin liquids. F/u SLP interventions for compensatory strategies and exercises may be beneficial. Will follow acutely recommend SNF at next level of care.    Swallow Evaluation Recommendations       SLP Diet Recommendations: Dysphagia 3 (Mech soft) solids;Thin liquid   Liquid Administration via: Cup;Straw   Medication Administration: Whole meds with puree   Supervision: Patient able to self feed   Compensations: Slow rate;Small sips/bites;Multiple dry swallows after each bite/sip;Effortful swallow   Postural Changes: Seated upright at 90 degrees   Oral Care Recommendations: Oral care BID        Aunya Lemler,  Katherene Ponto 05/06/2017,10:44 AM

## 2017-05-06 NOTE — NC FL2 (Signed)
[place] LEVEL OF CARE SCREENING TOOL     IDENTIFICATION  Patient Name: Andre [name] Birthdate: Jul 01, 1950 Sex: male Admission Date (Current Location): 05/04/2017  [facility] and [place] Number:  Herbalist and Address:  The [place], [address], [place], [place] [address]      Provider Number: [id number]  Attending Physician Name and Address:  Annia Belt, MD  Relative Name and Phone Number:       Current Level of Care: Hospital Recommended Level of Care: [place] Prior Approval Number:    Date Approved/Denied:   PASRR Number: [id number]  Discharge Plan: SNF    Current Diagnoses: Patient Active Problem List   Diagnosis Date Noted  . CVA (cerebral vascular accident) ([place]) 05/04/2017  . Callus 09/06/2015  . Healthcare maintenance 09/06/2015  . Benign essential HTN 05/09/2015  . History of CVA (cerebrovascular accident) 05/09/2015  . Tobacco use disorder   . HLD (hyperlipidemia)   . Alcohol abuse   . Polysubstance abuse ([place]) 03/31/2011    Orientation RESPIRATION BLADDER Height & Weight     Time, Situation, Place, Self  Normal External catheter(catheter placed 05/04/17) Weight: 149 lb 14.6 oz (68 kg) Height:  5\' 10"  (177.8 cm)  BEHAVIORAL SYMPTOMS/MOOD NEUROLOGICAL BOWEL NUTRITION STATUS      Continent Diet(mechanical soft)  AMBULATORY STATUS COMMUNICATION OF NEEDS Skin   Extensive Assist Verbally Normal                       Personal Care Assistance Level of Assistance  Bathing, Feeding, Dressing Bathing Assistance: Maximum assistance Feeding assistance: Independent Dressing Assistance: Maximum assistance     Functional Limitations Info  Sight, Hearing, Speech Sight Info: Adequate Hearing Info: Adequate Speech Info: Impaired    SPECIAL CARE FACTORS FREQUENCY  PT (By licensed PT), OT (By licensed OT), Speech therapy     PT Frequency: 5x/wk OT Frequency: 5x/wk      Speech Therapy Frequency: 5x/wk      Contractures Contractures Info: Not present    Additional Factors Info  Code Status, Allergies Code Status Info: DNR Allergies Info: NKA           Current Medications (05/06/2017):  This is the current hospital active medication list Current Facility-Administered Medications  Medication Dose Route Frequency Provider Last Rate Last Dose  . 0.9 % NaCl with KCl 40 mEq / L  infusion   Intravenous Continuous Valinda Party, DO 75 mL/hr at 05/06/17 0614 75 mL/hr at 05/06/17 0614  . acetaminophen (TYLENOL) tablet 650 mg  650 mg Oral Q4H PRN Kalman Shan Ratliff, DO       Or  . acetaminophen (TYLENOL) solution 650 mg  650 mg Per Tube Q4H PRN Valinda Party, DO       Or  . acetaminophen (TYLENOL) suppository 650 mg  650 mg Rectal Q4H PRN Valinda Party, DO      . aspirin EC tablet 325 mg  325 mg Oral Daily Rosalin Hawking, MD   325 mg at 05/06/17 1155   Or  . aspirin suppository 300 mg  300 mg Rectal Daily Rosalin Hawking, MD      . atorvastatin (LIPITOR) tablet 80 mg  80 mg Oral q1800 Colbert Ewing, MD   80 mg at 05/05/17 1719  . chlorhexidine (PERIDEX) 0.12 % solution 5 mL  5 mL Mouth/Throat TID Welford Roche, MD      . clopidogrel (PLAVIX) tablet  75 mg  75 mg Oral Daily Kathi Ludwig, MD   75 mg at 05/06/17 1154  . enoxaparin (LOVENOX) injection 40 mg  40 mg Subcutaneous Q24H Hoffman, Jessica Ratliff, DO   40 mg at 05/05/17 1719  . feeding supplement (ENSURE ENLIVE) (ENSURE ENLIVE) liquid 237 mL  237 mL Oral BID BM Annia Belt, MD   237 mL at 05/06/17 1209  . folic acid (FOLVITE) tablet 1 mg  1 mg Oral Daily Hoffman, Jessica Ratliff, DO   1 mg at 05/06/17 1154  . LORazepam (ATIVAN) tablet 1 mg  1 mg Oral Q6H PRN Valinda Party, DO       Or  . LORazepam (ATIVAN) injection 1 mg  1 mg Intravenous Q6H PRN Kalman Shan Ratliff, DO      . multivitamin with minerals tablet 1 tablet  1  tablet Oral Daily Hoffman, Jessica Ratliff, DO   1 tablet at 05/06/17 1153  . senna-docusate (Senokot-S) tablet 1 tablet  1 tablet Oral QHS PRN Kalman Shan Ratliff, DO      . thiamine (VITAMIN B-1) tablet 100 mg  100 mg Oral Daily Kalman Shan Ratliff, DO   100 mg at 05/06/17 1155   Or  . thiamine (B-1) injection 100 mg  100 mg Intravenous Daily Valinda Party, DO         Discharge Medications: Please see discharge summary for a list of discharge medications.  Relevant Imaging Results:  Relevant Lab Results:   Additional Information SS#: [id number]  Geralynn Ochs, LCSW

## 2017-05-06 NOTE — Progress Notes (Signed)
Pt temp 101.2. Tylenol given. @ 1500. Temp rechecked at 1600, 100.6. MD notified

## 2017-05-06 NOTE — Progress Notes (Signed)
Initial Nutrition Assessment  DOCUMENTATION CODES:   Not applicable  INTERVENTION:    Ensure Enlive po BID, each supplement provides 350 kcal and 20 grams of protein  Provide MVI daily  NUTRITION DIAGNOSIS:   Swallowing difficulty related to dysphagia as evidenced by (per SLP notes).  GOAL:   Patient will meet greater than or equal to 90% of their needs  MONITOR:   PO intake, Supplement acceptance, Weight trends, Labs, Diet advancement  REASON FOR ASSESSMENT:   Malnutrition Screening Tool    ASSESSMENT:   Pt with PMH significant for HTN, polysubstance abuse (tobacco, alcohol, cocaine), medication non-compliance (plavix), ischemic stroke with residual LLE instability, and mild dysphagia (Jan 2017) . Presents this admission with right sided hemiplegia likely acute CVA in setting of HTN/cocaine use.    1/1- SLP recommends DYS 1 diet, thin liquids 1/2- SLP recommends DYS 2 diet, thin liquids  Spoke with pt at bedside. Denies any loss in appetite prior to/during admission. States he typically eats 2-3 meals per day with snacks. Resident notes pt endorses drinking several 40 oz beers daily and consistent use of cocaine. Pt cannot say if his appetite gets worse or better with substance use. Pt with noted mild dysphagia from CVA in Jan 2017, SLP following. No meal completions charted at this time. Pt amendable to Ensure this stay.   Pt reports a UBW of 170 lb the last time being at that weight 3 months ago. Question reliably. Records indicate pt weighed 169 lb 09/2016 and 149 lb this admission. This shows an 11.8% wt loss in 7 months. Not significant for time frame.   Nutrition-Focused physical exam completed.   Medications reviewed and include: folic acid, MVI with minerals, thiamine, Na Cl with KCl Labs reviewed: calcium ionized 1.08 (L)   NUTRITION - FOCUSED PHYSICAL EXAM:    Most Recent Value  Orbital Region  No depletion  Upper Arm Region  Moderate depletion  Thoracic  and Lumbar Region  No depletion  Buccal Region  No depletion  Temple Region  No depletion  Clavicle Bone Region  Moderate depletion  Clavicle and Acromion Bone Region  Mild depletion  Scapular Bone Region  No depletion  Dorsal Hand  Mild depletion  Patellar Region  Moderate depletion  Anterior Thigh Region  Moderate depletion  Posterior Calf Region  Moderate depletion  Edema (RD Assessment)  None  Hair  Reviewed  Eyes  Reviewed  Mouth  Reviewed  Skin  Reviewed  Nails  Reviewed      Diet Order:  DIET DYS 3 Room service appropriate? Yes; Fluid consistency: Thin  EDUCATION NEEDS:   Not appropriate for education at this time  Skin:  Skin Assessment: Reviewed RN Assessment  Last BM:  PTA  Height:   Ht Readings from Last 1 Encounters:  05/04/17 5\' 10"  (1.778 m)    Weight:   Wt Readings from Last 1 Encounters:  05/04/17 149 lb 14.6 oz (68 kg)    Ideal Body Weight:  75.5 kg  BMI:  Body mass index is 21.51 kg/m.  Estimated Nutritional Needs:   Kcal:  2000-2200 kcal/day  Protein:  100-110 g/day  Fluid:  >2 L/day    Andre [name] RD, LDN Clinical Nutrition Pager # [phone]

## 2017-05-06 NOTE — Progress Notes (Signed)
Pt left for xray @ 1700.  Pt returned to unit @ 1811

## 2017-05-07 DIAGNOSIS — Z960 Presence of urogenital implants: Secondary | ICD-10-CM

## 2017-05-07 DIAGNOSIS — B370 Candidal stomatitis: Secondary | ICD-10-CM

## 2017-05-07 DIAGNOSIS — R471 Dysarthria and anarthria: Secondary | ICD-10-CM

## 2017-05-07 DIAGNOSIS — R509 Fever, unspecified: Secondary | ICD-10-CM

## 2017-05-07 LAB — CBC
HCT: 47.2 % (ref 39.0–52.0)
Hemoglobin: 15.2 g/dL (ref 13.0–17.0)
MCH: 30.5 pg (ref 26.0–34.0)
MCHC: 32.2 g/dL (ref 30.0–36.0)
MCV: 94.6 fL (ref 78.0–100.0)
Platelets: 167 10*3/uL (ref 150–400)
RBC: 4.99 MIL/uL (ref 4.22–5.81)
RDW: 13.9 % (ref 11.5–15.5)
WBC: 4.4 10*3/uL (ref 4.0–10.5)

## 2017-05-07 LAB — BASIC METABOLIC PANEL
Anion gap: 8 (ref 5–15)
BUN: 10 mg/dL (ref 6–20)
CHLORIDE: 103 mmol/L (ref 101–111)
CO2: 25 mmol/L (ref 22–32)
CREATININE: 1.09 mg/dL (ref 0.61–1.24)
Calcium: 9.1 mg/dL (ref 8.9–10.3)
GFR calc Af Amer: >60 mL/min (ref >60)
GFR calc non Af Amer: >60 mL/min (ref >60)
Glucose, Bld: 120 mg/dL — ABNORMAL HIGH (ref 65–99)
POTASSIUM: 3.5 mmol/L (ref 3.5–5.1)
Sodium: 136 mmol/L (ref 135–145)

## 2017-05-07 LAB — PSA: PROSTATIC SPECIFIC ANTIGEN: 2.16 ng/mL (ref 0.00–4.00)

## 2017-05-07 MED ORDER — AMLODIPINE BESYLATE 5 MG PO TABS
5.0000 mg | ORAL_TABLET | Freq: Every day | ORAL | Status: DC
  Administered 2017-05-07 – 2017-05-08 (×2): 5 mg via ORAL
  Filled 2017-05-07 (×2): qty 1

## 2017-05-07 NOTE — Progress Notes (Addendum)
Occupational Therapy Treatment Patient Details Name: ADRIANE GUGLIELMO MRN: [id number] DOB: 1950/06/08 Today's Date: 05/07/2017    History of present illness ELIM ECONOMOU is an 67 y.o. male with PMH of stroke, HTN, cocaine abuse presents with right side weakness and right gaze deviation.  MRI positive for L pontine stroke with previous h/o R pontine stroke.   OT comments  Pt making steady progress towards goals, completing multiple sit<>stands at [facility] this session with MinA+2. Pt completed grooming ADLs with setup assist and MinA+2 during standing portions of task; completed toileting with total assist for gown management and peri-care after BM. Pt continues to demonstrate decreased activity tolerance, dynamic balance, and decreased RUE functional use. Will continue to follow acutely and continue to recommend SNF level therapies after discharge to maximize Pt's safety and independence with ADLs and mobility.    Follow Up Recommendations  SNF;Supervision/Assistance - 24 hour    Equipment Recommendations  Other (comment)(defer to next venue )          Precautions / Restrictions Precautions Precautions: Fall Precaution Comments: visual deficits Restrictions Weight Bearing Restrictions: No       Mobility Bed Mobility Overal bed mobility: Needs Assistance Bed Mobility: Supine to Sit     Supine to sit: Min assist;+2 for physical assistance     General bed mobility comments: cues for advancing LEs over EOB and assist to bring trunk upright into sitting  Transfers Overall transfer level: Needs assistance   Transfers: Sit to/from Stand Sit to Stand: Min assist;Min guard;+2 physical assistance;+2 safety/equipment         General transfer comment: Pt stood at [place] with MinA+2, progressed to MinGuard end of session, completing sit<>stand approx 5x during session; Pt maintains static standing with overall MinA+2, standing approx 3.5 min prior to needing seated rest break;  Pt also practicing wt shifting while standing with MinA+2; cues for hand placement with increased effort to reach RUE towards bar of [place]    Balance Overall balance assessment: Needs assistance Sitting-balance support: Feet supported Sitting balance-[name]: Fair Sitting balance - Comments: maintains static sitting with close MinGuard for safety    Standing balance support: Bilateral upper extremity supported;Single extremity supported;During functional activity Standing balance-[name]: Poor Standing balance comment: reliant on UE support and external support from therapist for standing balance                             ADL either performed or assessed with clinical judgement   ADL Overall ADL's : Needs assistance/impaired     Grooming: Wash/dry face;Sitting;Minimal assistance;Set up Grooming Details (indicate cue type and reason): Pt using LUE to wash face while standing in [place] at sink; standing balance with MinA+2                 Toilet Transfer: [facility] Toilet Transfer Details (indicate cue type and reason): BSC over toilet; use of [place] with MinA+2 for sit<>stand to transfer  [place] and Hygiene: Total assistance;+2 for physical assistance;+2 for safety/equipment;Sit to/from stand Toileting - Clothing Manipulation Details (indicate cue type and reason): assist for gown management and total assist for peri-care after BM with additional therapist supporting Pt in standing at [facility] ADL Comments: Pt stood from [place] to complete grooming ADLs; initially requiring MinA+2 for sit<>stand, progressing to MinGuard by end of session; Pt demonstrating wt shifting while standing and able to lift LEs with  MinA+2; Pt stood at sink approx 3.5 min during grooming activity prior to needing seated rest break                        Cognition Arousal/Alertness: Awake/alert Behavior During Therapy: WFL for tasks  assessed/performed Overall Cognitive Status: No family/caregiver present to determine baseline cognitive functioning                                 General Comments: Pt with increased awareness, follows one step directions throughout session without difficulty                           Pertinent Vitals/ Pain       Pain Assessment: No/denies pain                                                          Frequency  Min 2X/week        Progress Toward Goals  OT Goals(current goals can now be found in the care plan section)  Progress towards OT goals: Progressing toward goals  Acute Rehab OT Goals Patient Stated Goal: To get some rehab OT Goal Formulation: With patient Time For Goal Achievement: 05/19/17 Potential to Achieve Goals: Good  Plan Discharge plan remains appropriate    Co-evaluation    PT/OT/SLP Co-Evaluation/Treatment: Yes Reason for Co-Treatment: For patient/therapist safety;To address functional/ADL transfers PT goals addressed during session: Mobility/safety with mobility;Balance OT goals addressed during session: ADL's and self-care;Strengthening/ROM      AM-PAC PT "6 Clicks" Daily Activity     Outcome Measure   Help from another person eating meals?: A Little Help from another person taking care of personal grooming?: A Little Help from another person toileting, which includes using toliet, bedpan, or urinal?: A Lot Help from another person bathing (including washing, rinsing, drying)?: A Lot Help from another person to put on and taking off regular upper body clothing?: A Lot Help from another person to put on and taking off regular lower body clothing?: A Lot 6 Click Score: 14    End of Session Equipment Utilized During Treatment: Gait belt  OT Visit Diagnosis: Unsteadiness on feet (R26.81);Other abnormalities of gait and mobility (R26.89);Muscle weakness (generalized) (M62.81);Other symptoms and  signs involving cognitive function;Low vision, both eyes (H54.2);Hemiplegia and hemiparesis Hemiplegia - Right/Left: Right Hemiplegia - dominant/non-dominant: Dominant Hemiplegia - caused by: Cerebral infarction   Activity Tolerance Patient tolerated treatment well   Patient Left with call bell/phone within reach;in bed;with bed alarm set   Nurse Communication Mobility status        Time: 5361-4431 OT Time Calculation (min): 30 min  Charges: OT General Charges $OT Visit: 1 Visit OT Treatments $Self Care/Home Management : 8-22 mins  Lou Cal, OT Pager [phone] 05/07/2017   Raymondo Band 05/07/2017, 4:53 PM

## 2017-05-07 NOTE — Progress Notes (Signed)
Medicine attending: I examined this patient today together with resident physician Dr. Thomasene Ripple and we discussed current management plan.  Day 3 post pontine stroke with resulting right hemiplegia. He is showing improvement.  He is now able to elevate his right arm against gravity and with some resistance.  Still no distal strength.  He can move his lower extremity by using his hip flexors.  Dysarthric speech improved.  Isolated fever yesterday has resolved.  Chest x-ray without gross infiltrate. He has a condom cath.  Urine analysis on December 31 was normal. If he remains afebrile over the next 24 hours, he is otherwise stable to transfer to a skilled nursing facility.

## 2017-05-07 NOTE — Progress Notes (Signed)
Physical Therapy Treatment Patient Details Name: Andre [name] MRN: [id number] DOB: [birth year] Today's Date: 05/07/2017    History of Present Illness Andre [name] is an 67 y.o. male with PMH of stroke, HTN, cocaine abuse presents with right side weakness and right gaze deviation.  MRI positive for L pontine stroke with previous h/o R pontine stroke.    PT Comments    Pt is making steady progress towards his goals, however is still limited in his mobility by R sided weakness. Pt is currently minA for bed mobility, minA progressing to min guard for sit<>stand from [place], and able to stand in [name] for 3 minutes with minA to perform grooming tasks at the sink. Pt to begin ambulation in next PT session. D/c plans remain appropriate at this time. PT will continue to follow acutely.   Follow Up Recommendations  SNF;Supervision/Assistance - 24 hour     Equipment Recommendations  Other (comment)       Precautions / Restrictions Precautions Precautions: Fall Precaution Comments: visual deficits Restrictions Weight Bearing Restrictions: No    Mobility  Bed Mobility Overal bed mobility: Needs Assistance Bed Mobility: Supine to Sit     Supine to sit: Min assist;+2 for physical assistance     General bed mobility comments: cues for advancing LEs over EOB and assist to bring trunk upright into sitting  Transfers Overall transfer level: Needs assistance Equipment used: 2 person hand held assist Transfers: Sit to/from Stand Sit to Stand: Min assist;Min guard;+2 physical assistance;+2 safety/equipment   Squat pivot transfers: Max assist     General transfer comment: Pt stood at [place] with MinA+2, progressed to MinGuard end of session, completing sit<>stand approx 5x during session; Pt maintains static standing with overall MinA+2, standing approx 3.5 min prior to needing seated rest break; Pt also practicing wt shifting while standing with MinA+2; cues for hand placement with  increased effort to reach RUE towards bar of [place]  Ambulation/Gait             General Gait Details: not attempted       Balance Overall balance assessment: Needs assistance Sitting-balance support: Feet supported Sitting balance-[name]: Fair Sitting balance - Comments: maintains static sitting with close MinGuard for safety    Standing balance support: Bilateral upper extremity supported;Single extremity supported;During functional activity Standing balance-[name]: Poor Standing balance comment: reliant on UE support and external support from therapist for standing balance                              Cognition Arousal/Alertness: Awake/alert Behavior During Therapy: WFL for tasks assessed/performed Overall Cognitive Status: No family/caregiver present to determine baseline cognitive functioning                                 General Comments: Pt with increased awareness, follows one step directions throughout session without difficulty              Pertinent Vitals/Pain Pain Assessment: No/denies pain           PT Goals (current goals can now be found in the care plan section) Acute Rehab PT Goals Patient Stated Goal: To get some rehab PT Goal Formulation: With patient Time For Goal Achievement: 05/19/17 Potential to Achieve Goals: Fair Progress towards PT goals: Progressing toward goals    Frequency    Min 3X/week  PT Plan Current plan remains appropriate    Co-evaluation PT/OT/SLP Co-Evaluation/Treatment: Yes Reason for Co-Treatment: For patient/therapist safety;To address functional/ADL transfers PT goals addressed during session: Mobility/safety with mobility OT goals addressed during session: ADL's and self-care;Strengthening/ROM      AM-PAC PT "6 Clicks" Daily Activity  Outcome Measure  Difficulty turning over in bed (including adjusting bedclothes, sheets and blankets)?: Unable Difficulty moving  from lying on back to sitting on the side of the bed? : Unable Difficulty sitting down on and standing up from a chair with arms (e.g., wheelchair, bedside commode, etc,.)?: Unable Help needed moving to and from a bed to chair (including a wheelchair)?: A Lot Help needed walking in hospital room?: Total Help needed climbing 3-5 steps with a railing? : Total 6 Click Score: 7    End of Session Equipment Utilized During Treatment: Gait belt Activity Tolerance: Patient limited by fatigue Patient left: in bed;with call bell/phone within reach;with bed alarm set Nurse Communication: Mobility status PT Visit Diagnosis: Other abnormalities of gait and mobility (R26.89);Hemiplegia and hemiparesis Hemiplegia - Right/Left: Right Hemiplegia - dominant/non-dominant: Dominant Hemiplegia - caused by: Cerebral infarction     Time: 8270-7867 PT Time Calculation (min) (ACUTE ONLY): 27 min  Charges:  $Therapeutic Activity: 8-22 mins                    G Codes:       Malijah Lietz B. Migdalia Dk PT, DPT Acute Rehabilitation  [phone] Pager [phone]     [place] 05/07/2017, 5:57 PM

## 2017-05-07 NOTE — Progress Notes (Signed)
Subjective:  Patient seen laying comfortably in bed in no acute distress. He was difficult to understand during exam, but reportedly improved from previous days. Patient also notices he is able to move his R leg and arm more than on previous days. Patient encouraged by changes and wondering when he can leave hospital for SNF.   Objective:  Vital signs in last 24 hours: Vitals:   05/06/17 2134 05/07/17 0101 05/07/17 0500 05/07/17 0935  BP:   (!) 154/83 119/78  Pulse: 67 70 72 76  Resp: 17 17 16 20   Temp: 98.8 F (37.1 C) 98.2 F (36.8 C) 98.6 F (37 C) 98.2 F (36.8 C)  TempSrc: Oral Oral Oral Oral  SpO2: 97% 100% 98% 98%  Weight:      Height:       Physical Exam  Constitutional: He appears well-developed and well-nourished. No distress.  HENT:  Mouth/Throat: Oropharynx is clear and moist.  Interval improvement in oral thrush  Cardiovascular: Normal rate, regular rhythm and intact distal pulses.  No murmur heard. Respiratory: Effort normal. No respiratory distress. He has no wheezes.  No crackles appreciated.  GI: Soft. He exhibits no distension. There is no tenderness.  Musculoskeletal: He exhibits no edema (of bilateral lower extremities) or tenderness (of bilateral lower extremities).  Neurological:  Smile asymmetric, with interval improvement from yesterdays exam. Tongue deviated to left. Sensation to touch of R face decreased compared to left, subjective improvement from yesterday's exam. 4/5 grip, bicep, tricep strength on LUE. 3/5 RUE bicep strength, 2+ grip strength. 4/5 hip flexion, dorsiflexion and plantar flexion of LLE. 2+/5 hip flexion of RLE, 0-1/5 dorsiflexion and plantar flexion of RLE.   Skin: Skin is warm and dry. No rash noted. He is not diaphoretic. No erythema.   Assessment/Plan:  Active Problems:   Benign essential HTN   CVA (cerebral vascular accident) [facility])  Mr. Rauls is a 67yo male with PMH significant for HTN, hx of strokes, and hx of cocaine  use who presents with right sided weakness and slurred speech since 11pm last night. Code stroke called, no TPA given secondary to CT head negative for large vessel occlusion and outside window. MRI positive for acute/early subacute pontine infarct at L lower paramedian pons.  CVA of left lower paramedian pons, with hx of previous CVA: Patient's previous neuro defects continue to improve while inpatient, which is very encouraging. Patient will be stable for discharge to SNF once afebrile for 24 hours, likely tomorrow. Appreciate SW assistance with this discharge. -Neurology signed off, will follow as outpatient -Atorvastatin 80mg  daily -Aspirin 81 mg and Plavix 75mg  daily -Telemetry -Neuro checks -SW consulted, assistance appreciated  Fever: Patient febrile >101 x2 yesterday afternoon. WBC within normal limits on admission and urinalysis without evidence of UTI at that time. Patient's chest imaging on admission without evidence of pneumonia. When he had fever yesterday, blood cultures obtained and additional chest imaging done given increased risk of aspiration. This was negative for acute infiltrations. Will continue to monitor and patient will be stable for discharge pending continued improvement as above. -Blood cultures no growth @ 24 hours -Chest x ray without  AKI: Cr 1.29 on admission, returned to baseline 1.2-1.3 with IVF. Cr currently 1.09. -Continue to monitor  Hx of HTN: BP 150s-160s/80s overnight. Home regimen includes amlodipine 5mg  daily, which patient had not been taking prior to admission. -Home amlodipine  Hx of cocaine use and alcohol use disorder: UDS positive for cocaine on admission. Last used 12/30 PM,  likely contributing to HTN. Patient also drinks 2-3 40oz beers per day. -CIWA with Ativan -Thiamine, folate, daily multivitamin  FEN/GI: -Dysphagia III, per speech recommendations -No IVF, replace electrolytes as needed  VTE Prophylaxis: Lovenox Code Status:  DNR  Dispo: Anticipated discharge in approximately 1-2 day(s), pending SNF placement.  Thomasene Ripple, MD 05/07/2017, 1:11 PM Pager: Mamie Nick [phone]

## 2017-05-07 NOTE — Care Management Note (Signed)
Case Management Note  Patient Details  Name: Andre [name] MRN: [id number] Date of Birth: 09/04/1950  Subjective/Objective:  Pt admitted with CVA. He is currently homeless.                 Action/Plan: PT/OT recommending SNF. CM following for d/c disposition.  Expected Discharge Date:                  Expected Discharge Plan:  Skilled Nursing Facility  In-House Referral:  Clinical Social Work  Discharge planning Services     Post Acute Care Choice:    Choice offered to:     DME Arranged:    DME Agency:     HH Arranged:    [place] Agency:     Status of Service:  In process, will continue to follow  If discussed at Long Length of Stay Meetings, dates discussed:    Additional Comments:  Pollie Friar, RN 05/07/2017, 10:25 AM

## 2017-05-08 DIAGNOSIS — L859 Epidermal thickening, unspecified: Secondary | ICD-10-CM | POA: Diagnosis not present

## 2017-05-08 DIAGNOSIS — R278 Other lack of coordination: Secondary | ICD-10-CM | POA: Diagnosis not present

## 2017-05-08 DIAGNOSIS — G464 Cerebellar stroke syndrome: Secondary | ICD-10-CM | POA: Diagnosis not present

## 2017-05-08 DIAGNOSIS — I10 Essential (primary) hypertension: Secondary | ICD-10-CM | POA: Diagnosis not present

## 2017-05-08 DIAGNOSIS — Z8673 Personal history of transient ischemic attack (TIA), and cerebral infarction without residual deficits: Secondary | ICD-10-CM | POA: Diagnosis not present

## 2017-05-08 DIAGNOSIS — G8194 Hemiplegia, unspecified affecting left nondominant side: Secondary | ICD-10-CM | POA: Diagnosis not present

## 2017-05-08 DIAGNOSIS — I6329 Cerebral infarction due to unspecified occlusion or stenosis of other precerebral arteries: Secondary | ICD-10-CM | POA: Diagnosis not present

## 2017-05-08 DIAGNOSIS — I639 Cerebral infarction, unspecified: Secondary | ICD-10-CM | POA: Diagnosis not present

## 2017-05-08 DIAGNOSIS — R1319 Other dysphagia: Secondary | ICD-10-CM | POA: Diagnosis not present

## 2017-05-08 DIAGNOSIS — Z7902 Long term (current) use of antithrombotics/antiplatelets: Secondary | ICD-10-CM | POA: Diagnosis not present

## 2017-05-08 DIAGNOSIS — I69322 Dysarthria following cerebral infarction: Secondary | ICD-10-CM | POA: Diagnosis not present

## 2017-05-08 DIAGNOSIS — R2681 Unsteadiness on feet: Secondary | ICD-10-CM | POA: Diagnosis not present

## 2017-05-08 DIAGNOSIS — I63219 Cerebral infarction due to unspecified occlusion or stenosis of unspecified vertebral arteries: Secondary | ICD-10-CM | POA: Diagnosis not present

## 2017-05-08 DIAGNOSIS — R531 Weakness: Secondary | ICD-10-CM | POA: Diagnosis not present

## 2017-05-08 DIAGNOSIS — M6281 Muscle weakness (generalized): Secondary | ICD-10-CM | POA: Diagnosis not present

## 2017-05-08 DIAGNOSIS — I69392 Facial weakness following cerebral infarction: Secondary | ICD-10-CM | POA: Diagnosis not present

## 2017-05-08 DIAGNOSIS — Z9114 Patient's other noncompliance with medication regimen: Secondary | ICD-10-CM | POA: Diagnosis not present

## 2017-05-08 LAB — BASIC METABOLIC PANEL
Anion gap: 8 (ref 5–15)
BUN: 15 mg/dL (ref 6–20)
CALCIUM: 8.9 mg/dL (ref 8.9–10.3)
CO2: 24 mmol/L (ref 22–32)
Chloride: 103 mmol/L (ref 101–111)
Creatinine, Ser: 1.01 mg/dL (ref 0.61–1.24)
Glucose, Bld: 86 mg/dL (ref 65–99)
POTASSIUM: 4 mmol/L (ref 3.5–5.1)
Sodium: 135 mmol/L (ref 135–145)

## 2017-05-08 LAB — CBC
HEMATOCRIT: 44.3 % (ref 39.0–52.0)
HEMOGLOBIN: 14 g/dL (ref 13.0–17.0)
MCH: 29.5 pg (ref 26.0–34.0)
MCHC: 31.6 g/dL (ref 30.0–36.0)
MCV: 93.5 fL (ref 78.0–100.0)
Platelets: 171 10*3/uL (ref 150–400)
RBC: 4.74 MIL/uL (ref 4.22–5.81)
RDW: 13.4 % (ref 11.5–15.5)
WBC: 5.3 10*3/uL (ref 4.0–10.5)

## 2017-05-08 MED ORDER — CHLORHEXIDINE GLUCONATE 0.12 % MT SOLN: 5.0000 mL | Freq: Three times a day (TID) | OROMUCOSAL | 0 refills | Status: DC

## 2017-05-08 MED ORDER — ASPIRIN 81 MG PO TBEC: 81.0000 mg | DELAYED_RELEASE_TABLET | Freq: Every day | ORAL | 2 refills | Status: DC

## 2017-05-08 MED ORDER — ATORVASTATIN CALCIUM 80 MG PO TABS: 80.0000 mg | ORAL_TABLET | Freq: Every day | ORAL | 0 refills | Status: DC

## 2017-05-08 NOTE — Plan of Care (Signed)
  Education: Knowledge of disease or condition will improve 05/08/2017 0432 - Progressing by Bronson Curb, RN Knowledge of secondary prevention will improve 05/08/2017 0432 - Progressing by Bronson Curb, RN Knowledge of patient specific risk factors addressed and post discharge goals established will improve 05/08/2017 0432 - Progressing by Bronson Curb, RN   Self-Care: Ability to participate in self-care as condition permits will improve 05/08/2017 0432 - Progressing by [facility], Lanetta Inch, RN

## 2017-05-08 NOTE — Progress Notes (Signed)
Medicine attending: I examined this patient today together with resident physician Dr. Thomasene Ripple and I concur with her evaluation and management plan which we discussed together. He is making slow and steady progress.  More mobility and strength of his right upper extremity and now some signs of recovery of motor function of his right lower extremity.  Dysarthria is decreasing.  He remains afebrile after a single temperature spike the other day.  No signs of aspiration. He is now day 4 post pontine strike with right hemiplegia and cranial nerve palsies.  Improving in all areas.  Stable for transfer to rehab facility when bed available.

## 2017-05-08 NOTE — Progress Notes (Signed)
Physical Therapy Treatment Patient Details Name: Andre [name] MRN: [id number] DOB: [birth year] Today's Date: 05/08/2017    History of Present Illness Andre [name] is an 67 y.o. male with PMH of stroke, HTN, cocaine abuse presents with right side weakness and right gaze deviation.  MRI positive for L pontine stroke with previous h/o R pontine stroke.    PT Comments    Patient tolerated treatment well. Focused on motor control, transfer, weight-bearing/acceptance and awareness, transfers, and pre-gait weight shifting. Progressing towards goals. Had an episode of bowel incontinence with some insightful awareness however this was last minute, requiring peri-care. Recommend continued skilled PT.   Follow Up Recommendations  SNF;Supervision/Assistance - 24 hour     Equipment Recommendations  Other (comment)    Recommendations for Other Services       Precautions / Restrictions Precautions Precautions: Fall Precaution Comments: visual deficits Restrictions Weight Bearing Restrictions: No    Mobility  Bed Mobility Overal bed mobility: Needs Assistance Bed Mobility: Supine to Sit;Sit to Supine Rolling: Mod assist   Supine to sit: Min assist;HOB elevated Sit to supine: Min assist   General bed mobility comments: Supervision with roll to Rt side, use of rail and cues for technique. Mod assist to roll onto left side during pericare after BM. Min assist for truncal support with rise to EOB and for truncal support wil getting back into bed. Educated on use of LLE to support RLE as needed however getting out of bed pt only needed min assist to adduct lt hip once upright.  Transfers Overall transfer level: Needs assistance Equipment used: 1 person hand held assist Transfers: Sit to/from [facility] Sit to Stand: Mod assist Stand pivot transfers: Mod assist       General transfer comment: Practiced standing x3 from bed with Rt knee block initially progressing  to mod assist without knee block and use of hands on back of chair for stability. VC for technique and assist for balance. Pivoted to and from bed and BSC with mod assistance for balance, Rt foot placement, and and awareness of whewre to aim when sitting.   Ambulation/Gait Ambulation/Gait assistance: Mod assist Ambulation Distance (Feet): 0 Feet Assistive device: (Back of chair for support)       General Gait Details: Pre-gait activities completed including weight shifting and acceptance through RLE, x5 minutes, pt able to move feet slowly with pivotal steps notable ataxia and minor buckling but able to correct with use of UE support and moderate assistance for balance.   Stairs            Wheelchair Mobility    Modified Rankin (Stroke Patients Only) Modified Rankin (Stroke Patients Only) Pre-Morbid Rankin Score: Slight disability Modified Rankin: Moderately severe disability     Balance                                            Cognition Arousal/Alertness: Awake/alert Behavior During Therapy: WFL for tasks assessed/performed Overall Cognitive Status: No family/caregiver present to determine baseline cognitive functioning                                 General Comments: Pt with increased awareness, follows one step directions throughout session without difficulty       Exercises Other Exercises Other Exercises: LAQ RLE x10 aiming  for target Other Exercises: Seated weight shifting activity, reaching for targets to Rt and across body with Lt. Forward reaching x10. Weight bearing and shift through RUE towards Rt with cues for awareness and safety.    General Comments General comments (skin integrity, edema, etc.): Pt had bowel movement while transferring to [facility]. Decreased awareness. Hygiene performed.      Pertinent Vitals/Pain      Home Living                      Prior Function            PT Goals (current goals can  now be found in the care plan section) Acute Rehab PT Goals Patient Stated Goal: To get some rehab PT Goal Formulation: With patient Time For Goal Achievement: 05/19/17 Potential to Achieve Goals: Fair Progress towards PT goals: Progressing toward goals    Frequency    Min 3X/week      PT Plan Current plan remains appropriate    Co-evaluation              AM-PAC PT "6 Clicks" Daily Activity  Outcome Measure  Difficulty turning over in bed (including adjusting bedclothes, sheets and blankets)?: Unable Difficulty moving from lying on back to sitting on the side of the bed? : Unable Difficulty sitting down on and standing up from a chair with arms (e.g., wheelchair, bedside commode, etc,.)?: Unable Help needed moving to and from a bed to chair (including a wheelchair)?: A Lot Help needed walking in hospital room?: Total Help needed climbing 3-5 steps with a railing? : Total 6 Click Score: 7    End of Session Equipment Utilized During Treatment: Gait belt Activity Tolerance: Patient tolerated treatment well Patient left: in bed;with call bell/phone within reach;with bed alarm set Nurse Communication: Mobility status PT Visit Diagnosis: Other abnormalities of gait and mobility (R26.89);Hemiplegia and hemiparesis Hemiplegia - Right/Left: Right Hemiplegia - dominant/non-dominant: Dominant Hemiplegia - caused by: Cerebral infarction     Time: 9407-6808 PT Time Calculation (min) (ACUTE ONLY): 27 min  Charges:  $Therapeutic Exercise: 8-22 mins $Therapeutic Activity: 8-22 mins                    G Codes:       [employer], PT    Ellouise Newer 05/08/2017, 3:14 PM

## 2017-05-08 NOTE — Consult Note (Signed)
            [facility] CM Primary Care Navigator  05/08/2017  Andre [name] 1950/10/13 [id number]   Attempt to meet patient at the bedside on the first time, but staff is with patient providing patient care.  Will attempt to see patient at another time when available in the room.    Addendum:  Went back to see patient in the room to identify possible discharge but he was on the phone talking to a family member (nephew) and requested to come back later.   Will try to see patient at another time when he is available.    Addendum:  Attempt to go back to see patient at the bedside to identify any possible discharge needs but patient was already discharge per staff report.  Patient was discharged to skilled nursing facility (SNF- [facility]) for rehabilitation per therapy recommendation.  Per chart review, patient wants to get better and do better when he goes home. Patient said his nephew- [name] is trying to help him find a new place to live, and he would like for him to be contacted when he is discharged.  Patient has discharge instruction to follow-up with primary care provider as soon as possible for a visit in 2 weeks.   For additional questions please contact:  Edwena Felty A. Silveria Botz, BSN, RN-BC [facility] PRIMARY CARE Navigator Cell: [phone]

## 2017-05-08 NOTE — Clinical Social Work Placement (Signed)
Nurse to call report to [phone], Ask for [facility] nurse    CLINICAL SOCIAL WORK PLACEMENT  NOTE  Date:  05/08/2017  Patient Details  Name: Andre [name] MRN: [id number] Date of Birth: 1951/04/24  Clinical Social Work is seeking post-discharge placement for this patient at the [place] level of care (*CSW will initial, date and re-position this form in  chart as items are completed):  Yes   Patient/family provided with [place] Work Department's list of facilities offering this level of care within the geographic area requested by the patient (or if unable, by the patient's family).  Yes   Patient/family informed of their freedom to choose among providers that offer the needed level of care, that participate in Medicare, Medicaid or managed care program needed by the patient, have an available bed and are willing to accept the patient.  Yes   Patient/family informed of [facility]'s ownership interest in [facility] and [facility], as well as of the fact that they are under no obligation to receive care at these facilities.  PASRR submitted to EDS on 05/06/17     PASRR number received on 05/06/17     Existing PASRR number confirmed on       FL2 transmitted to all facilities in geographic area requested by pt/family on 05/06/17     FL2 transmitted to all facilities within larger geographic area on       Patient informed that his/her managed care company has contracts with or will negotiate with certain facilities, including the following:        Yes   Patient/family informed of bed offers received.  Patient chooses bed at [facility]     Physician recommends and patient chooses bed at      Patient to be transferred to [facility] on 05/08/17.  Patient to be transferred to facility by [facility]     Patient family notified on 05/08/17 of transfer.  Name of family member notified:  Jon     PHYSICIAN       Additional  Comment:    _______________________________________________ Geralynn Ochs, [place] 05/08/2017, 2:04 PM

## 2017-05-08 NOTE — Clinical Social Work Note (Signed)
Clinical Social Work Assessment  Patient Details  Name: Andre [name] MRN: [id number] Date of Birth: [birth year]  Date of referral:  05/07/17               Reason for consult:  Facility Placement                Permission sought to share information with:  Facility Sport and exercise psychologist, Family Supports Permission granted to share information::  Yes, Verbal Permission Granted  Name::     Equities trader::  SNF  Relationship::  Therapist, art Information:     Housing/Transportation Living arrangements for the past 2 months:  Apartment, Homeless Source of Information:  Patient, Medical Team Patient Interpreter Needed:  None Criminal Activity/Legal Involvement Pertinent to Current Situation/Hospitalization:  No - Comment as needed Significant Relationships:  Other Family Members Lives with:  Self Do you feel safe going back to the place where you live?  No Need for family participation in patient care:  No (Coment)  Care giving concerns:  Patient had been living at home with his girlfriend, before she kicked him out and then he began utilizing substances. Patient wants to get stronger and better, and will benefit from short term rehab stay at discharge.    Social Worker assessment / plan:  CSW met with patient to discuss recommendation for SNF. CSW explained that patient would have limited options due to his substance use, and provided bed offers to patient. CSW confirmed bed availability at [facility] and facility initiated insurance authorization.  Employment status:  Retired Nurse, adult PT Recommendations:  [place] / Referral to community resources:  [place]  Patient/Family's Response to care:  Patient agreeable to SNF placement.  Patient/Family's Understanding of and Emotional Response to Diagnosis, Current Treatment, and Prognosis:  Patient discussed how he knew that he needed rehab because he needed  to be able to walk again. Patient discussed how he wants to get better and do better when he goes home. Patient said his nephew Andre [name] is trying to help him find a new place to live, and he would like him to be contacted when he is moved.  Emotional Assessment Appearance:  Appears stated age Attitude/Demeanor/Rapport:    Affect (typically observed):  Appropriate, Pleasant Orientation:  Oriented to Self, Oriented to Place, Oriented to  Time, Oriented to Situation Alcohol / Substance use:  Not Applicable Psych involvement (Current and /or in the community):  No (Comment)  Discharge Needs  Concerns to be addressed:  Care Coordination Readmission within the last 30 days:  No Current discharge risk:  Lives alone, Physical Impairment, Dependent with Mobility, Substance Abuse Barriers to Discharge:  Continued Medical Work up, [place], [place] 05/08/2017, 9:21 AM

## 2017-05-08 NOTE — Progress Notes (Signed)
Patient discharged to [facility], discharge paper work sent with patient. [facility] transported by [facility] services left floor via stretcher. Attempted to call report no answer when transferred to [employer].   Travious Vanover, Tivis Ringer, RN

## 2017-05-08 NOTE — Progress Notes (Signed)
Called to patient room by patient's significant other.  Pt's significant other states "he has bed bugs here."  Pt states "I don't see any bugs."  Significant other states "He has bed bugs, it was brown and flat."  Staff unable to visualize insect and patient's significant other states "it was just one."  Pt. Placed on contact precautions per protocol and educated family to call if additional "bugs" visualized.

## 2017-05-08 NOTE — Care Management Important Message (Signed)
Important Message  Patient Details  Name: Andre [name] MRN: [id number] Date of Birth: 06-02-1950   Medicare Important Message Given:  Yes    Nathen May 05/08/2017, 11:23 AM

## 2017-05-08 NOTE — Care Management Note (Signed)
Case Management Note  Patient Details  Name: Andre [name] MRN: [id number] Date of Birth: [birth year]  Subjective/Objective:                    Action/Plan: Pt discharging to [facility] SNF. No further needs per CM.  Expected Discharge Date:  05/08/17               Expected Discharge Plan:  Skilled Nursing Facility  In-House Referral:  Clinical Social Work  Discharge planning Services     Post Acute Care Choice:    Choice offered to:     DME Arranged:    DME Agency:     HH Arranged:    [place] Agency:     Status of Service:  Completed, signed off  If discussed at [employer], dates discussed:    Additional Comments:  Pollie Friar, RN 05/08/2017, 1:32 PM

## 2017-05-08 NOTE — Progress Notes (Signed)
   Subjective:  Patient seen comfortably laying in bed this AM in no acute distress. Reports continuing interval improvement in his symptoms of R sided weakness and dysarthria. Patient more understandable on exam today. No acute complaints.   Objective:  Vital signs in last 24 hours: Vitals:   05/07/17 1647 05/07/17 2100 05/08/17 0039 05/08/17 0451  BP: (!) 142/84 135/81 (!) 141/92 139/89  Pulse: 70 73 65 64  Resp: 20 18 18 18   Temp: 98.6 F (37 C) 98.9 F (37.2 C) 98.3 F (36.8 C) 98.2 F (36.8 C)  TempSrc: Oral Oral Oral Oral  SpO2: 97% 97% 100% 98%  Weight:      Height:       Physical Exam  Constitutional: He appears well-developed and well-nourished. No distress.  Cardiovascular: Normal rate, regular rhythm and intact distal pulses.  No murmur heard. Respiratory: Effort normal. No respiratory distress. He has no wheezes.  No crackles in anterior lung fields appreciated.  GI: Soft. He exhibits no distension. There is no tenderness.  Musculoskeletal: He exhibits no edema (of bilateral lower extremities) or tenderness (of bilateral lower extremities).  Neurological:  Smile asymmetric, with interval improvement. 4/5 grip, bicep, tricep strength on LUE. 3/5 RUE grip strength. 4+/5 hip flexion, dorsiflexion and plantar flexion of LLE. 3/5 hip flexion of RLE, 1+/5 dorsiflexion and plantar flexion of RLE.   Skin: Skin is warm and dry. No rash noted. He is not diaphoretic. No erythema.   Assessment/Plan:  Active Problems:   Benign essential HTN   CVA (cerebral vascular accident) [facility])   Fever  Mr. Fessel is a 67yo male with PMH significant for HTN, hx of strokes, and hx of cocaine use who presents with right sided weakness and slurred speech since 11pm night of presentation. Code stroke called, no TPA given secondary to CT head negative for large vessel occlusion and outside window. MRI positive for acute/early subacute pontine infarct at L lower paramedian pons.  CVA of left  lower paramedian pons, with hx of previous CVA: Patient stable for discharge to SNF today. Appreciate SW assistance with this discharge. -Neurology signed off, will follow as outpatient -Atorvastatin 80mg  daily -Aspirin 81 mg and Plavix 75mg  daily -Telemetry -Neuro checks -SW consulted, assistance appreciated  Fever: Resolved. No signs or symptoms of infection while inpatient, workup negative.   AKI: Cr 1.29 on admission, returned to baseline around 1 with IVF. Currently stable at baseline.  -Continue to monitor  Hx of HTN: BP at goal <140/80 on home regimen for past 24 hours. -Home amlodipine 5 mg  Hx of cocaine use and alcohol use disorder: UDS positive for cocaine on admission. Last used 12/30 PM, likely contributing to HTN. Patient also drinks 2-3 40oz beers per day. -CIWA with Ativan -Thiamine, folate, daily multivitamin  FEN/GI: -Dysphagia III, per speech recommendations -No IVF, replace electrolytes as needed  VTE Prophylaxis: Lovenox Code Status: DNR  Dispo: Anticipated discharge today.  Thomasene Ripple, MD 05/08/2017, 7:13 AM

## 2017-05-11 LAB — CULTURE, BLOOD (ROUTINE X 2)
Culture: NO GROWTH
Culture: NO GROWTH
SPECIAL REQUESTS: ADEQUATE
Special Requests: ADEQUATE

## 2017-05-14 DIAGNOSIS — R531 Weakness: Secondary | ICD-10-CM | POA: Diagnosis not present

## 2017-05-14 DIAGNOSIS — L859 Epidermal thickening, unspecified: Secondary | ICD-10-CM | POA: Diagnosis not present

## 2017-05-14 DIAGNOSIS — G8194 Hemiplegia, unspecified affecting left nondominant side: Secondary | ICD-10-CM | POA: Diagnosis not present

## 2017-05-14 DIAGNOSIS — I639 Cerebral infarction, unspecified: Secondary | ICD-10-CM | POA: Diagnosis not present

## 2017-05-14 DIAGNOSIS — I10 Essential (primary) hypertension: Secondary | ICD-10-CM | POA: Diagnosis not present

## 2017-06-03 NOTE — Progress Notes (Signed)
   CC: Follow up on essential hypertension  HPI:  Mr.Sumeet P Fan is a 67 y.o. male with history noted below that presents to the Internal medicine clinic for follow up on essential hypertension.  Please see problem based charting for the status of patient's chronic medical conditions.    Past Medical History:  Diagnosis Date  . History of blood transfusion    "qwhen I got my jaw broke" (05/05/2017)  . Hypertension   . Polysubstance abuse ([place])    including alcohol, tobacco, and cocaine /notes 05/04/2017  . Stroke ([place]) 11/12012  . Stroke ([place]) 05/04/2017   Recurrent pontine stroke with resulting right hemiplegia and right cranial nerve palsies/notes 05/04/2017    Review of Systems:  Review of Systems  Eyes: Negative for blurred vision.  Respiratory: Negative for shortness of breath.   Cardiovascular: Negative for chest pain.  Neurological: Negative for focal weakness.     Physical Exam:  Vitals:   06/04/17 1347 06/04/17 1410  BP: (!) 144/84 127/83  Pulse: 91 88  Temp: 97.7 F (36.5 C)   TempSrc: Oral   SpO2: 98%   Weight: 159 lb (72.1 kg)   Height: 5\' 7"  (1.702 m)    Physical Exam  Constitutional: He is well-developed, well-nourished, and in no distress.  Cardiovascular: Normal rate, regular rhythm and normal heart sounds.  Pulmonary/Chest: Effort normal and breath sounds normal. No respiratory distress. He has no wheezes. He has no rales.    Assessment & Plan:   See encounters tab for problem based medical decision making.   Patient discussed with Dr. Lynnae January

## 2017-06-04 ENCOUNTER — Ambulatory Visit (INDEPENDENT_AMBULATORY_CARE_PROVIDER_SITE_OTHER): Payer: Medicare Other | Admitting: Internal Medicine

## 2017-06-04 ENCOUNTER — Other Ambulatory Visit: Payer: Self-pay

## 2017-06-04 ENCOUNTER — Encounter: Payer: Self-pay | Admitting: Internal Medicine

## 2017-06-04 VITALS — BP 127/83 | HR 88 | Temp 97.7°F | Ht 67.0 in | Wt 159.0 lb

## 2017-06-04 DIAGNOSIS — Z8673 Personal history of transient ischemic attack (TIA), and cerebral infarction without residual deficits: Secondary | ICD-10-CM

## 2017-06-04 DIAGNOSIS — Z7982 Long term (current) use of aspirin: Secondary | ICD-10-CM | POA: Diagnosis not present

## 2017-06-04 DIAGNOSIS — I10 Essential (primary) hypertension: Secondary | ICD-10-CM | POA: Diagnosis not present

## 2017-06-04 DIAGNOSIS — Z79899 Other long term (current) drug therapy: Secondary | ICD-10-CM | POA: Diagnosis not present

## 2017-06-04 DIAGNOSIS — Z7902 Long term (current) use of antithrombotics/antiplatelets: Secondary | ICD-10-CM | POA: Diagnosis not present

## 2017-06-04 DIAGNOSIS — Z0000 Encounter for general adult medical examination without abnormal findings: Secondary | ICD-10-CM

## 2017-06-04 DIAGNOSIS — F17210 Nicotine dependence, cigarettes, uncomplicated: Secondary | ICD-10-CM

## 2017-06-04 MED ORDER — CLOPIDOGREL BISULFATE 75 MG PO TABS: 75.0000 mg | ORAL_TABLET | Freq: Every day | ORAL | 3 refills | Status: DC

## 2017-06-04 MED ORDER — ATORVASTATIN CALCIUM 80 MG PO TABS: 80.0000 mg | ORAL_TABLET | Freq: Every day | ORAL | 3 refills | Status: DC

## 2017-06-04 MED ORDER — ASPIRIN 81 MG PO TBEC: 81.0000 mg | DELAYED_RELEASE_TABLET | Freq: Every day | ORAL | 1 refills | Status: DC

## 2017-06-04 MED ORDER — AMLODIPINE BESYLATE 5 MG PO TABS: 5.0000 mg | ORAL_TABLET | Freq: Every day | ORAL | 3 refills | Status: DC

## 2017-06-04 NOTE — Patient Instructions (Signed)
Mr. Jodoin,  Is a pleasure seen today. Please continue to take all your medications. On April 1 stop taking aspirin but continue the rest. Please follow up in 6 months or sooner if needed.

## 2017-06-05 DIAGNOSIS — Z7982 Long term (current) use of aspirin: Secondary | ICD-10-CM | POA: Diagnosis not present

## 2017-06-05 DIAGNOSIS — I69322 Dysarthria following cerebral infarction: Secondary | ICD-10-CM | POA: Diagnosis not present

## 2017-06-05 DIAGNOSIS — I10 Essential (primary) hypertension: Secondary | ICD-10-CM | POA: Diagnosis not present

## 2017-06-05 DIAGNOSIS — I69392 Facial weakness following cerebral infarction: Secondary | ICD-10-CM | POA: Diagnosis not present

## 2017-06-05 DIAGNOSIS — I69351 Hemiplegia and hemiparesis following cerebral infarction affecting right dominant side: Secondary | ICD-10-CM | POA: Diagnosis not present

## 2017-06-07 ENCOUNTER — Encounter: Payer: Self-pay | Admitting: Internal Medicine

## 2017-06-07 NOTE — Assessment & Plan Note (Signed)
Assessment: Healthcare maintenance Patient is due for colonoscopy.  Offered referral for screening colonoscopy and patient declined at this time.  States he will discuss it at next visit.  Plan -re-address at next visit

## 2017-06-07 NOTE — Assessment & Plan Note (Signed)
Assessment: Essential Hypertension Patient states he is currently taking amlodipine 5mg  daily .  His blood pressure today is 127/83, stable and at goal  Plan - continue amlodipine 5mg  daily

## 2017-06-07 NOTE — Assessment & Plan Note (Signed)
Assessment:  History of CVA  Patient was admitted to the hospital on 12/31 for CVA of the left paramedian pons. At that time patient was nonadherent to hypertensive medications or plavix for previous stroke.  On hospital discharge she was started on high-dose intensity statin.  He was also put on aspirin 81 mg and Plavix 75 mg for planned 46-month course and then decrease to plavix alone.  Today patient state he is currently taking aspirin 81mg , plavix 75mg , and atorvastatin 80mg .  Will continue these medications and asked patient to stop aspirin on April 1st.  Plan -aspirin 81mg , plavix 75mg , and atorvastatin 80mg  -Stop aspirin April 1st

## 2017-06-08 ENCOUNTER — Telehealth: Payer: Self-pay

## 2017-06-08 DIAGNOSIS — I69322 Dysarthria following cerebral infarction: Secondary | ICD-10-CM | POA: Diagnosis not present

## 2017-06-08 DIAGNOSIS — I69351 Hemiplegia and hemiparesis following cerebral infarction affecting right dominant side: Secondary | ICD-10-CM | POA: Diagnosis not present

## 2017-06-08 DIAGNOSIS — Z7982 Long term (current) use of aspirin: Secondary | ICD-10-CM | POA: Diagnosis not present

## 2017-06-08 DIAGNOSIS — I10 Essential (primary) hypertension: Secondary | ICD-10-CM | POA: Diagnosis not present

## 2017-06-08 DIAGNOSIS — I69392 Facial weakness following cerebral infarction: Secondary | ICD-10-CM | POA: Diagnosis not present

## 2017-06-08 NOTE — Telephone Encounter (Signed)
VO for OT and speech therapy for eval and treat, continue HHN and PT, do you agree?

## 2017-06-08 NOTE — Telephone Encounter (Signed)
[name] with [facility] requesting VO. Please call pt back.

## 2017-06-08 NOTE — Progress Notes (Signed)
Internal Medicine Clinic Attending  Case discussed with Dr. Hoffman at the time of the visit.  We reviewed the resident's history and exam and pertinent patient test results.  I agree with the assessment, diagnosis, and plan of care documented in the resident's note.  

## 2017-06-08 NOTE — Telephone Encounter (Signed)
I agree

## 2017-06-10 DIAGNOSIS — I69351 Hemiplegia and hemiparesis following cerebral infarction affecting right dominant side: Secondary | ICD-10-CM | POA: Diagnosis not present

## 2017-06-10 DIAGNOSIS — I10 Essential (primary) hypertension: Secondary | ICD-10-CM | POA: Diagnosis not present

## 2017-06-10 DIAGNOSIS — Z7982 Long term (current) use of aspirin: Secondary | ICD-10-CM | POA: Diagnosis not present

## 2017-06-10 DIAGNOSIS — I69392 Facial weakness following cerebral infarction: Secondary | ICD-10-CM | POA: Diagnosis not present

## 2017-06-10 DIAGNOSIS — I69322 Dysarthria following cerebral infarction: Secondary | ICD-10-CM | POA: Diagnosis not present

## 2017-06-11 DIAGNOSIS — I10 Essential (primary) hypertension: Secondary | ICD-10-CM | POA: Diagnosis not present

## 2017-06-11 DIAGNOSIS — I69392 Facial weakness following cerebral infarction: Secondary | ICD-10-CM | POA: Diagnosis not present

## 2017-06-11 DIAGNOSIS — I69322 Dysarthria following cerebral infarction: Secondary | ICD-10-CM | POA: Diagnosis not present

## 2017-06-11 DIAGNOSIS — I69351 Hemiplegia and hemiparesis following cerebral infarction affecting right dominant side: Secondary | ICD-10-CM | POA: Diagnosis not present

## 2017-06-11 DIAGNOSIS — Z7982 Long term (current) use of aspirin: Secondary | ICD-10-CM | POA: Diagnosis not present

## 2017-06-12 DIAGNOSIS — I69351 Hemiplegia and hemiparesis following cerebral infarction affecting right dominant side: Secondary | ICD-10-CM | POA: Diagnosis not present

## 2017-06-12 DIAGNOSIS — Z7982 Long term (current) use of aspirin: Secondary | ICD-10-CM | POA: Diagnosis not present

## 2017-06-12 DIAGNOSIS — I69392 Facial weakness following cerebral infarction: Secondary | ICD-10-CM | POA: Diagnosis not present

## 2017-06-12 DIAGNOSIS — I10 Essential (primary) hypertension: Secondary | ICD-10-CM | POA: Diagnosis not present

## 2017-06-12 DIAGNOSIS — I69322 Dysarthria following cerebral infarction: Secondary | ICD-10-CM | POA: Diagnosis not present

## 2017-06-15 DIAGNOSIS — Z7982 Long term (current) use of aspirin: Secondary | ICD-10-CM | POA: Diagnosis not present

## 2017-06-15 DIAGNOSIS — I10 Essential (primary) hypertension: Secondary | ICD-10-CM | POA: Diagnosis not present

## 2017-06-15 DIAGNOSIS — I69351 Hemiplegia and hemiparesis following cerebral infarction affecting right dominant side: Secondary | ICD-10-CM | POA: Diagnosis not present

## 2017-06-15 DIAGNOSIS — I69322 Dysarthria following cerebral infarction: Secondary | ICD-10-CM | POA: Diagnosis not present

## 2017-06-15 DIAGNOSIS — I69392 Facial weakness following cerebral infarction: Secondary | ICD-10-CM | POA: Diagnosis not present

## 2017-06-17 ENCOUNTER — Other Ambulatory Visit: Payer: Self-pay

## 2017-06-17 ENCOUNTER — Encounter (INDEPENDENT_AMBULATORY_CARE_PROVIDER_SITE_OTHER): Payer: Self-pay

## 2017-06-17 ENCOUNTER — Telehealth: Payer: Self-pay | Admitting: Internal Medicine

## 2017-06-17 ENCOUNTER — Ambulatory Visit (INDEPENDENT_AMBULATORY_CARE_PROVIDER_SITE_OTHER): Payer: Medicare Other | Admitting: Internal Medicine

## 2017-06-17 VITALS — BP 140/89 | HR 119 | Temp 101.4°F | Wt 156.9 lb

## 2017-06-17 DIAGNOSIS — I69392 Facial weakness following cerebral infarction: Secondary | ICD-10-CM | POA: Diagnosis not present

## 2017-06-17 DIAGNOSIS — R51 Headache: Secondary | ICD-10-CM | POA: Diagnosis not present

## 2017-06-17 DIAGNOSIS — R05 Cough: Secondary | ICD-10-CM | POA: Diagnosis not present

## 2017-06-17 DIAGNOSIS — F17210 Nicotine dependence, cigarettes, uncomplicated: Secondary | ICD-10-CM | POA: Diagnosis not present

## 2017-06-17 DIAGNOSIS — R000 Tachycardia, unspecified: Secondary | ICD-10-CM | POA: Diagnosis not present

## 2017-06-17 DIAGNOSIS — R509 Fever, unspecified: Secondary | ICD-10-CM | POA: Insufficient documentation

## 2017-06-17 DIAGNOSIS — I10 Essential (primary) hypertension: Secondary | ICD-10-CM | POA: Diagnosis not present

## 2017-06-17 DIAGNOSIS — R221 Localized swelling, mass and lump, neck: Secondary | ICD-10-CM

## 2017-06-17 DIAGNOSIS — Z0000 Encounter for general adult medical examination without abnormal findings: Secondary | ICD-10-CM

## 2017-06-17 DIAGNOSIS — Z7982 Long term (current) use of aspirin: Secondary | ICD-10-CM | POA: Diagnosis not present

## 2017-06-17 DIAGNOSIS — I69351 Hemiplegia and hemiparesis following cerebral infarction affecting right dominant side: Secondary | ICD-10-CM | POA: Diagnosis not present

## 2017-06-17 DIAGNOSIS — I69322 Dysarthria following cerebral infarction: Secondary | ICD-10-CM | POA: Diagnosis not present

## 2017-06-17 HISTORY — DX: Localized swelling, mass and lump, neck: R22.1

## 2017-06-17 NOTE — Assessment & Plan Note (Signed)
Referral for screening colonoscopy

## 2017-06-17 NOTE — Progress Notes (Signed)
   CC: Cough, headaches  HPI:  [name] is a 67 y.o. male with a past medical history of conditions listed below presenting to the clinic with a 2-day history of cough and headaches. Please see problem based charting for the status of the patient's current and chronic medical conditions.   Past Medical History:  Diagnosis Date  . Benign essential HTN 05/09/2015  . History of blood transfusion    "qwhen I got my jaw broke" (05/05/2017)  . Hypertension   . Polysubstance abuse ([place])    including alcohol, tobacco, and cocaine /notes 05/04/2017  . Stroke ([place]) 11/12012  . Stroke ([place]) 05/04/2017   Recurrent pontine stroke with resulting right hemiplegia and right cranial nerve palsies/notes 05/04/2017   Review of Systems: Pertinent positives mentioned in HPI. Remainder of all ROS negative.   Physical Exam:  Vitals:   06/17/17 1522  BP: 140/89  Pulse: (!) 119  Temp: (!) 101.4 F (38.6 C)  TempSrc: Oral  SpO2: 97%  Weight: 156 lb 14.4 oz (71.2 kg)   Physical Exam  Constitutional: He is oriented to person, place, and time. He appears well-developed and well-nourished. No distress.  HENT:  Head: Normocephalic and atraumatic.  Mouth/Throat: Oropharynx is clear and moist.  Eyes: Right eye exhibits no discharge. Left eye exhibits no discharge.  Neck:  A large approximately 5 x 5 cm mobile, nontender mass palpated in the left parotid region.  2 approximately 2 x 2 centimeter mobile, nontender lymph nodes palpated in the submental region.   Cardiovascular: Regular rhythm and intact distal pulses.  Tachycardic  Pulmonary/Chest: Effort normal and breath sounds normal. No respiratory distress. He has no wheezes. He has no rales.  Abdominal: Soft. Bowel sounds are normal. He exhibits no distension. There is no tenderness.  Musculoskeletal: He exhibits no edema.  Neurological: He is alert and oriented to person, place, and time.  Skin: Skin is warm and dry.    Assessment &  Plan:   See Encounters Tab for problem based charting.  Patient discussed with Dr. Dareen Piano

## 2017-06-17 NOTE — Assessment & Plan Note (Signed)
On exam, a large approximately 5 x 5 cm mobile, nontender mass palpated in the left parotid region.  2 approximately 2 x 2 centimeter mobile, nontender lymph nodes palpated in the submental region.   He is currently febrile with symptoms of influenza but denies having any prior episodes of fever.  Denies having any fatigue or night sweats. He has unintentionally lost 13 pounds in the past 9 months.  Does report smoking 1 pack/day for 16 years, now reduced to 3 cigarettes/day.  However, no lung mass reported on chest x-ray done in January 2019.  He has never had screening colonoscopy. CBC checked last month not suggestive of anemia.  PSA checked last month was normal.  Plan -CT neck with contrast -He is due for a screening colonoscopy.  Referral has been placed.

## 2017-06-17 NOTE — Patient Instructions (Addendum)
Andre [name] it was nice meeting you today.  -Take Tamiflu 75 mg twice daily for 5 days.  -Make sure you drink plenty of fluids and stay hydrated.  -I have ordered a CT scan of your neck.  Our clinic will get it scheduled for you.  -You have also been referred for a screening colonoscopy.  -Please return for a follow-up in 7-10 days.  If your symptoms do not improve, please return sooner.   Influenza, Adult Influenza ("the flu") is an infection in the lungs, nose, and throat (respiratory tract). It is caused by a virus. The flu causes many common cold symptoms, as well as a high fever and body aches. It can make you feel very sick. The flu spreads easily from person to person (is contagious). Getting a flu shot (influenza vaccination) every year is the best way to prevent the flu. Follow these instructions at home:  Take over-the-counter and prescription medicines only as told by your doctor.  Use a cool mist humidifier to add moisture (humidity) to the air in your home. This can make it easier to breathe.  Rest as needed.  Drink enough fluid to keep your pee (urine) clear or pale yellow.  Cover your mouth and nose when you cough or sneeze.  Wash your hands with soap and water often, especially after you cough or sneeze. If you cannot use soap and water, use hand sanitizer.  Stay home from work or school as told by your doctor. Unless you are visiting your doctor, try to avoid leaving home until your fever has been gone for 24 hours without the use of medicine.  Keep all follow-up visits as told by your doctor. This is important. How is this prevented?  Getting a yearly (annual) flu shot is the best way to avoid getting the flu. You may get the flu shot in late summer, fall, or winter. Ask your doctor when you should get your flu shot.  Wash your hands often or use hand sanitizer often.  Avoid contact with people who are sick during cold and flu season.  Eat healthy  foods.  Drink plenty of fluids.  Get enough sleep.  Exercise regularly. Contact a doctor if:  You get new symptoms.  You have: ? Chest pain. ? Watery poop (diarrhea). ? A fever.  Your cough gets worse.  You start to have more mucus.  You feel sick to your stomach (nauseous).  You throw up (vomit). Get help right away if:  You start to be short of breath or have trouble breathing.  Your skin or nails turn a bluish color.  You have very bad pain or stiffness in your neck.  You get a sudden headache.  You get sudden pain in your face or ear.  You cannot stop throwing up. This information is not intended to replace advice given to you by your health care provider. Make sure you discuss any questions you have with your health care provider. Document Released: 01/29/2008 Document Revised: 09/27/2015 Document Reviewed: 02/13/2015 Elsevier Interactive Patient Education  2017 [employer].

## 2017-06-17 NOTE — Assessment & Plan Note (Signed)
Patient is presenting with a 2-day history of cough productive of minimal amount of white colored sputum and headaches.  States he was noted to have a fever this morning by his home health physical therapist and was advised to come into the clinic for a checkup.  His girlfriend was admitted to the hospital 2 days ago for the flu.  Patient denies having any dizziness, chills, rhinorrhea, sore throat, nausea, vomiting, body aches, pleuritic chest pain, or fatigue.  Reports having good oral intake.  Last influenza vaccine documented in the chart was in December 2017.  In the clinic, patient found to be tachycardic and febrile (temperature 101.2F).  Lungs clear on exam.  Plan -Will treat empirically for influenza with Tamiflu 75 mg twice daily.  Patient has been provided with a sample. -Encouraged oral hydration -Advised him to return to the clinic in 7-10 days for a follow-up

## 2017-06-18 DIAGNOSIS — I69351 Hemiplegia and hemiparesis following cerebral infarction affecting right dominant side: Secondary | ICD-10-CM | POA: Diagnosis not present

## 2017-06-18 DIAGNOSIS — I69322 Dysarthria following cerebral infarction: Secondary | ICD-10-CM | POA: Diagnosis not present

## 2017-06-18 DIAGNOSIS — Z7982 Long term (current) use of aspirin: Secondary | ICD-10-CM | POA: Diagnosis not present

## 2017-06-18 DIAGNOSIS — I69392 Facial weakness following cerebral infarction: Secondary | ICD-10-CM | POA: Diagnosis not present

## 2017-06-18 DIAGNOSIS — I10 Essential (primary) hypertension: Secondary | ICD-10-CM | POA: Diagnosis not present

## 2017-06-19 NOTE — Progress Notes (Signed)
Internal Medicine Clinic Attending  Case discussed with Dr. Rathoreat the time of the visit. We reviewed the resident's history and exam and pertinent patient test results. I agree with the assessment, diagnosis, and plan of care documented in the resident's note.  

## 2017-06-23 DIAGNOSIS — I69392 Facial weakness following cerebral infarction: Secondary | ICD-10-CM | POA: Diagnosis not present

## 2017-06-23 DIAGNOSIS — I69351 Hemiplegia and hemiparesis following cerebral infarction affecting right dominant side: Secondary | ICD-10-CM | POA: Diagnosis not present

## 2017-06-23 DIAGNOSIS — Z7982 Long term (current) use of aspirin: Secondary | ICD-10-CM | POA: Diagnosis not present

## 2017-06-23 DIAGNOSIS — I69322 Dysarthria following cerebral infarction: Secondary | ICD-10-CM | POA: Diagnosis not present

## 2017-06-23 DIAGNOSIS — I10 Essential (primary) hypertension: Secondary | ICD-10-CM | POA: Diagnosis not present

## 2017-06-25 DIAGNOSIS — I69351 Hemiplegia and hemiparesis following cerebral infarction affecting right dominant side: Secondary | ICD-10-CM | POA: Diagnosis not present

## 2017-06-25 DIAGNOSIS — I10 Essential (primary) hypertension: Secondary | ICD-10-CM | POA: Diagnosis not present

## 2017-06-25 DIAGNOSIS — Z7982 Long term (current) use of aspirin: Secondary | ICD-10-CM | POA: Diagnosis not present

## 2017-06-25 DIAGNOSIS — I69392 Facial weakness following cerebral infarction: Secondary | ICD-10-CM | POA: Diagnosis not present

## 2017-06-25 DIAGNOSIS — I69322 Dysarthria following cerebral infarction: Secondary | ICD-10-CM | POA: Diagnosis not present

## 2017-06-29 ENCOUNTER — Ambulatory Visit: Payer: Medicare Other

## 2017-06-29 ENCOUNTER — Encounter: Payer: Self-pay | Admitting: Internal Medicine

## 2017-06-30 ENCOUNTER — Encounter: Payer: Self-pay | Admitting: *Deleted

## 2017-06-30 ENCOUNTER — Telehealth: Payer: Self-pay | Admitting: Internal Medicine

## 2017-06-30 NOTE — Telephone Encounter (Signed)
[name] from [facility] [phone], needing a verbal orders to cont to seeing patient for  1 time a week for 5 weeks, med management, pain management and discharge as long as he remain stable.

## 2017-07-01 NOTE — Telephone Encounter (Signed)
I agree

## 2017-07-01 NOTE — Telephone Encounter (Signed)
Dr Heber Newport,  Do you agree with the requested services?  Please advise.Despina Hidden Cassady2/27/201912:11 PM

## 2017-07-01 NOTE — Telephone Encounter (Signed)
Andre [name] with [facility] needs to speak with a nurse about VO. Please call back.

## 2017-07-01 NOTE — Telephone Encounter (Signed)
Attempted to contact [name] back-no answer, verbal authorization left on recorder.  Instructed her to call back if she needs any additional info.Despina Hidden Cassady2/27/20193:00 PM

## 2017-07-02 DIAGNOSIS — I69351 Hemiplegia and hemiparesis following cerebral infarction affecting right dominant side: Secondary | ICD-10-CM | POA: Diagnosis not present

## 2017-07-02 DIAGNOSIS — Z7982 Long term (current) use of aspirin: Secondary | ICD-10-CM | POA: Diagnosis not present

## 2017-07-02 DIAGNOSIS — I69392 Facial weakness following cerebral infarction: Secondary | ICD-10-CM | POA: Diagnosis not present

## 2017-07-02 DIAGNOSIS — I69322 Dysarthria following cerebral infarction: Secondary | ICD-10-CM | POA: Diagnosis not present

## 2017-07-02 DIAGNOSIS — I10 Essential (primary) hypertension: Secondary | ICD-10-CM | POA: Diagnosis not present

## 2017-07-03 ENCOUNTER — Ambulatory Visit (HOSPITAL_COMMUNITY): Admission: RE | Admit: 2017-07-03 | Payer: Medicare Other | Source: Ambulatory Visit

## 2017-07-07 DIAGNOSIS — I10 Essential (primary) hypertension: Secondary | ICD-10-CM | POA: Diagnosis not present

## 2017-07-07 DIAGNOSIS — Z7982 Long term (current) use of aspirin: Secondary | ICD-10-CM | POA: Diagnosis not present

## 2017-07-07 DIAGNOSIS — I69322 Dysarthria following cerebral infarction: Secondary | ICD-10-CM | POA: Diagnosis not present

## 2017-07-07 DIAGNOSIS — I69351 Hemiplegia and hemiparesis following cerebral infarction affecting right dominant side: Secondary | ICD-10-CM | POA: Diagnosis not present

## 2017-07-07 DIAGNOSIS — I69392 Facial weakness following cerebral infarction: Secondary | ICD-10-CM | POA: Diagnosis not present

## 2017-07-14 DIAGNOSIS — I10 Essential (primary) hypertension: Secondary | ICD-10-CM | POA: Diagnosis not present

## 2017-07-14 DIAGNOSIS — I69392 Facial weakness following cerebral infarction: Secondary | ICD-10-CM | POA: Diagnosis not present

## 2017-07-14 DIAGNOSIS — I69351 Hemiplegia and hemiparesis following cerebral infarction affecting right dominant side: Secondary | ICD-10-CM | POA: Diagnosis not present

## 2017-07-14 DIAGNOSIS — I69322 Dysarthria following cerebral infarction: Secondary | ICD-10-CM | POA: Diagnosis not present

## 2017-07-14 DIAGNOSIS — Z7982 Long term (current) use of aspirin: Secondary | ICD-10-CM | POA: Diagnosis not present

## 2017-07-22 DIAGNOSIS — I10 Essential (primary) hypertension: Secondary | ICD-10-CM | POA: Diagnosis not present

## 2017-07-22 DIAGNOSIS — I69392 Facial weakness following cerebral infarction: Secondary | ICD-10-CM | POA: Diagnosis not present

## 2017-07-22 DIAGNOSIS — I69322 Dysarthria following cerebral infarction: Secondary | ICD-10-CM | POA: Diagnosis not present

## 2017-07-22 DIAGNOSIS — I69351 Hemiplegia and hemiparesis following cerebral infarction affecting right dominant side: Secondary | ICD-10-CM | POA: Diagnosis not present

## 2017-07-22 DIAGNOSIS — Z7982 Long term (current) use of aspirin: Secondary | ICD-10-CM | POA: Diagnosis not present

## 2017-07-29 DIAGNOSIS — Z5181 Encounter for therapeutic drug level monitoring: Secondary | ICD-10-CM | POA: Diagnosis not present

## 2017-07-29 NOTE — Addendum Note (Signed)
Addended by: Hulan Fray on: 07/29/2017 06:58 PM   Modules accepted: Orders

## 2017-08-07 ENCOUNTER — Telehealth: Payer: Self-pay | Admitting: *Deleted

## 2017-08-07 ENCOUNTER — Ambulatory Visit (INDEPENDENT_AMBULATORY_CARE_PROVIDER_SITE_OTHER): Payer: Medicare Other | Admitting: Internal Medicine

## 2017-08-07 DIAGNOSIS — L0202 Furuncle of face: Secondary | ICD-10-CM

## 2017-08-07 HISTORY — DX: Furuncle of face: L02.02

## 2017-08-07 MED ORDER — DOXYCYCLINE HYCLATE 100 MG PO CAPS: 100.0000 mg | ORAL_CAPSULE | Freq: Two times a day (BID) | ORAL | 0 refills | Status: DC

## 2017-08-07 NOTE — Telephone Encounter (Signed)
I have tried to call the pharmacy and been on hold for 28 min first and 33 second, hoping they are having phone problems, will call later

## 2017-08-07 NOTE — Patient Instructions (Signed)
FOLLOW-UP INSTRUCTIONS When: 1 week (may cancel if better) For: Abscess f/u What to bring: Nothing   [name],  It was a pleasure to see you today. Please take doxycycline (an antibiotic) twice a day for the next 5 days. Please make an appointment to follow up in 1 week. If the spot on your face clears up, you may cancel the appointment. If you have any questions or concerns, call our clinic at [phone] or after hours call [phone] and ask for the internal medicine resident on call. Thank you!  - Dr. Philipp Ovens

## 2017-08-07 NOTE — Assessment & Plan Note (Signed)
Patient is here with a painful lump on his right jaw that has been present for 1 month. He reports the area began spontaneously draining the other day. On exam is has ~2cm furuncle along his beard line. The area was partially drained with manual expression. Will avoid I&D given location on his face. Prescribed doxycyline 100 mg BID x 5 days. Instructed to return in 1 week if no improvement.

## 2017-08-07 NOTE — Telephone Encounter (Signed)
WALK-IN  Pt comes in to ask about meds then ask if triage can do something for his lip, area of top R upper lip swollen, looks moist, he has an odor to him and states that it "busted open" and 'it smells bad, real bad", this must be the foul odor. He states" I cant have this and be talking to the women, aint none gonna want me" Added to [facility] after speaking w/ dr Heber [place], informed dr's guilloud and huang verbally

## 2017-08-07 NOTE — Telephone Encounter (Signed)
Agree, seen in [facility] 

## 2017-08-07 NOTE — Progress Notes (Signed)
   CC: Cyst on face  HPI:  [name] is a 67 y.o. male with past medical history outlined below here for a cyst on his face. For the details of today's visit, please refer to the assessment and plan.  Past Medical History:  Diagnosis Date  . Benign essential HTN 05/09/2015  . History of blood transfusion    "qwhen I got my jaw broke" (05/05/2017)  . Hypertension   . Polysubstance abuse ([place])    including alcohol, tobacco, and cocaine /notes 05/04/2017  . Stroke ([place]) 11/12012  . Stroke ([place]) 05/04/2017   Recurrent pontine stroke with resulting right hemiplegia and right cranial nerve palsies/notes 05/04/2017    Review of Systems  Constitutional: Negative for chills and fever.    Physical Exam:  Vitals:   08/07/17 1118  BP: 139/88  Pulse: 89  Temp: 98 F (36.7 C)  TempSrc: Oral  SpO2: 100%  Weight: 155 lb 3.2 oz (70.4 kg)  Height: 5\' 9"  (1.753 m)    Constitutional: NAD, appears comfortable HEENT: 2 cm furuncle on his right jaw / beard line.   Pulmonary/Chest: Breathing comfortably on RA Neurological: A&Ox3, CN II - XII grossly intact. Moving all extremities. Psychiatric: Normal mood and affect  Assessment & Plan:   See Encounters Tab for problem based charting.  Patient discussed with Dr. Angelia Mould

## 2017-08-10 NOTE — Progress Notes (Signed)
Internal Medicine Clinic Attending  I saw and evaluated the patient.  I personally confirmed the key portions of the history and exam documented by Dr. Guilloud and I reviewed pertinent patient test results.  The assessment, diagnosis, and plan were formulated together and I agree with the documentation in the resident's note.  

## 2017-08-12 DIAGNOSIS — Z5181 Encounter for therapeutic drug level monitoring: Secondary | ICD-10-CM | POA: Diagnosis not present

## 2017-08-14 ENCOUNTER — Ambulatory Visit: Payer: Medicare Other

## 2017-08-17 DIAGNOSIS — Z5181 Encounter for therapeutic drug level monitoring: Secondary | ICD-10-CM | POA: Diagnosis not present

## 2017-08-24 DIAGNOSIS — Z5181 Encounter for therapeutic drug level monitoring: Secondary | ICD-10-CM | POA: Diagnosis not present

## 2017-08-30 ENCOUNTER — Emergency Department (HOSPITAL_COMMUNITY): Payer: Medicare Other

## 2017-08-30 ENCOUNTER — Encounter (HOSPITAL_COMMUNITY): Payer: Self-pay | Admitting: Emergency Medicine

## 2017-08-30 ENCOUNTER — Emergency Department (HOSPITAL_COMMUNITY)
Admission: EM | Admit: 2017-08-30 | Discharge: 2017-08-31 | Disposition: A | Discharge status: Home / Self Care | Payer: Medicare Other | Attending: Emergency Medicine | Admitting: Emergency Medicine

## 2017-08-30 DIAGNOSIS — R4182 Altered mental status, unspecified: Secondary | ICD-10-CM | POA: Insufficient documentation

## 2017-08-30 DIAGNOSIS — N179 Acute kidney failure, unspecified: Secondary | ICD-10-CM | POA: Diagnosis not present

## 2017-08-30 DIAGNOSIS — I6789 Other cerebrovascular disease: Secondary | ICD-10-CM | POA: Diagnosis not present

## 2017-08-30 DIAGNOSIS — R531 Weakness: Secondary | ICD-10-CM | POA: Diagnosis not present

## 2017-08-30 DIAGNOSIS — R079 Chest pain, unspecified: Secondary | ICD-10-CM | POA: Diagnosis not present

## 2017-08-30 DIAGNOSIS — S0990XA Unspecified injury of head, initial encounter: Secondary | ICD-10-CM | POA: Diagnosis not present

## 2017-08-30 DIAGNOSIS — F1010 Alcohol abuse, uncomplicated: Secondary | ICD-10-CM | POA: Insufficient documentation

## 2017-08-30 DIAGNOSIS — I10 Essential (primary) hypertension: Secondary | ICD-10-CM | POA: Diagnosis not present

## 2017-08-30 DIAGNOSIS — Z79899 Other long term (current) drug therapy: Secondary | ICD-10-CM | POA: Diagnosis not present

## 2017-08-30 DIAGNOSIS — E860 Dehydration: Secondary | ICD-10-CM | POA: Diagnosis not present

## 2017-08-30 DIAGNOSIS — F1210 Cannabis abuse, uncomplicated: Secondary | ICD-10-CM | POA: Diagnosis not present

## 2017-08-30 DIAGNOSIS — Z7982 Long term (current) use of aspirin: Secondary | ICD-10-CM | POA: Diagnosis not present

## 2017-08-30 DIAGNOSIS — Z8673 Personal history of transient ischemic attack (TIA), and cerebral infarction without residual deficits: Secondary | ICD-10-CM | POA: Insufficient documentation

## 2017-08-30 DIAGNOSIS — Z7901 Long term (current) use of anticoagulants: Secondary | ICD-10-CM | POA: Diagnosis not present

## 2017-08-30 DIAGNOSIS — F17210 Nicotine dependence, cigarettes, uncomplicated: Secondary | ICD-10-CM | POA: Insufficient documentation

## 2017-08-30 DIAGNOSIS — S199XXA Unspecified injury of neck, initial encounter: Secondary | ICD-10-CM | POA: Diagnosis not present

## 2017-08-30 DIAGNOSIS — R4020 Unspecified coma: Secondary | ICD-10-CM | POA: Diagnosis not present

## 2017-08-30 LAB — RAPID URINE DRUG SCREEN, HOSP PERFORMED
Amphetamines: NOT DETECTED
Barbiturates: NOT DETECTED
Benzodiazepines: NOT DETECTED
Cocaine: NOT DETECTED
OPIATES: NOT DETECTED
Tetrahydrocannabinol: POSITIVE — AB

## 2017-08-30 LAB — URINALYSIS, COMPLETE (UACMP) WITH MICROSCOPIC
Bilirubin Urine: NEGATIVE
GLUCOSE, UA: NEGATIVE mg/dL
Ketones, ur: NEGATIVE mg/dL
Leukocytes, UA: NEGATIVE
NITRITE: NEGATIVE
PROTEIN: NEGATIVE mg/dL
Specific Gravity, Urine: 1.015 (ref 1.005–1.030)
pH: 5 (ref 5.0–8.0)

## 2017-08-30 LAB — COMPREHENSIVE METABOLIC PANEL
ALBUMIN: 3.6 g/dL (ref 3.5–5.0)
ALT: 14 U/L — AB (ref 17–63)
AST: 25 U/L (ref 15–41)
Alkaline Phosphatase: 55 U/L (ref 38–126)
Anion gap: 8 (ref 5–15)
BUN: 14 mg/dL (ref 6–20)
CALCIUM: 9.1 mg/dL (ref 8.9–10.3)
CO2: 26 mmol/L (ref 22–32)
CREATININE: 1.46 mg/dL — AB (ref 0.61–1.24)
Chloride: 106 mmol/L (ref 101–111)
GFR calc Af Amer: 56 mL/min — ABNORMAL LOW (ref >60)
GFR calc non Af Amer: 49 mL/min — ABNORMAL LOW (ref >60)
GLUCOSE: 99 mg/dL (ref 65–99)
Potassium: 4.7 mmol/L (ref 3.5–5.1)
SODIUM: 140 mmol/L (ref 135–145)
Total Bilirubin: 1 mg/dL (ref 0.3–1.2)
Total Protein: 6.3 g/dL — ABNORMAL LOW (ref 6.5–8.1)

## 2017-08-30 LAB — CBC WITH DIFFERENTIAL/PLATELET
BASOS PCT: 0 %
Basophils Absolute: 0 10*3/uL (ref 0.0–0.1)
EOS ABS: 0.1 10*3/uL (ref 0.0–0.7)
Eosinophils Relative: 1 %
HCT: 43.2 % (ref 39.0–52.0)
Hemoglobin: 13.7 g/dL (ref 13.0–17.0)
Lymphocytes Relative: 12 %
Lymphs Abs: 0.9 10*3/uL (ref 0.7–4.0)
MCH: 28.9 pg (ref 26.0–34.0)
MCHC: 31.7 g/dL (ref 30.0–36.0)
MCV: 91.1 fL (ref 78.0–100.0)
MONO ABS: 0.2 10*3/uL (ref 0.1–1.0)
Monocytes Relative: 3 %
Neutro Abs: 6.5 10*3/uL (ref 1.7–7.7)
Neutrophils Relative %: 84 %
Platelets: 170 10*3/uL (ref 150–400)
RBC: 4.74 MIL/uL (ref 4.22–5.81)
RDW: 13.2 % (ref 11.5–15.5)
WBC: 7.8 10*3/uL (ref 4.0–10.5)

## 2017-08-30 LAB — ETHANOL: Alcohol, Ethyl (B): 125 mg/dL — ABNORMAL HIGH (ref <10)

## 2017-08-30 LAB — I-STAT CG4 LACTIC ACID, ED: LACTIC ACID, VENOUS: 3.1 mmol/L — AB (ref 0.5–1.9)

## 2017-08-30 LAB — AMMONIA: Ammonia: 32 umol/L (ref 9–35)

## 2017-08-30 MED ORDER — LACTATED RINGERS IV BOLUS
1000.0000 mL | Freq: Once | INTRAVENOUS | Status: AC
  Administered 2017-08-30: 1000 mL via INTRAVENOUS

## 2017-08-30 NOTE — ED Triage Notes (Signed)
Per EMS pt from home was drinking h/s of stroke 6 months ago, R sided deficit, pt fell out of chair and pt altered since, alert to person only, generalized weakness, BP 90/60, CBG 132, HR 72, 97%
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
## 2017-08-30 NOTE — ED Notes (Signed)
Patient transported to CT 

## 2017-08-31 DIAGNOSIS — R4182 Altered mental status, unspecified: Secondary | ICD-10-CM | POA: Diagnosis not present

## 2017-08-31 DIAGNOSIS — Z5181 Encounter for therapeutic drug level monitoring: Secondary | ICD-10-CM | POA: Diagnosis not present

## 2017-08-31 LAB — CBG MONITORING, ED: GLUCOSE-CAPILLARY: 94 mg/dL (ref 65–99)

## 2017-08-31 LAB — I-STAT CG4 LACTIC ACID, ED
LACTIC ACID, VENOUS: 2.39 mmol/L — AB (ref 0.5–1.9)
Lactic Acid, Venous: 4.15 mmol/L (ref 0.5–1.9)

## 2017-08-31 MED ORDER — SODIUM CHLORIDE 0.9 % IV BOLUS
1000.0000 mL | Freq: Once | INTRAVENOUS | Status: AC
  Administered 2017-08-31: 1000 mL via INTRAVENOUS

## 2017-08-31 NOTE — ED Notes (Signed)
Spoke with Andre [name] [facility]) informed him that pt was up for DC and he would be en route to get pt

## 2017-08-31 NOTE — ED Provider Notes (Signed)
Care assumed from Dr. Laverta Baltimore at shift change.  Patient was brought after an unresponsive episode that occurred while playing cards with friends.  He was drinking alcohol, then became briefly unresponsive.  He is now feeling well.  His work-up was unremarkable with the exception of an elevated lactate.  He was given IV fluids and lactate increased.  He was given additional IV fluids and his lactate is now trending downward.  The patient states that he feels fine and is requesting discharge.  I see no indication for hospitalization or further work-up.   Veryl Speak, MD 08/31/17 [phone]

## 2017-08-31 NOTE — Discharge Instructions (Addendum)
Follow up with primary care provider for repeat BMP to evaluate kidney function.

## 2017-09-04 LAB — CULTURE, BLOOD (ROUTINE X 2)
CULTURE: NO GROWTH
Culture: NO GROWTH
Special Requests: ADEQUATE

## 2017-09-09 DIAGNOSIS — Z5181 Encounter for therapeutic drug level monitoring: Secondary | ICD-10-CM | POA: Diagnosis not present

## 2017-09-14 DIAGNOSIS — Z5181 Encounter for therapeutic drug level monitoring: Secondary | ICD-10-CM | POA: Diagnosis not present

## 2017-09-21 DIAGNOSIS — Z5181 Encounter for therapeutic drug level monitoring: Secondary | ICD-10-CM | POA: Diagnosis not present

## 2017-09-25 ENCOUNTER — Encounter: Payer: Self-pay | Admitting: *Deleted

## 2017-09-29 DIAGNOSIS — Z5181 Encounter for therapeutic drug level monitoring: Secondary | ICD-10-CM | POA: Diagnosis not present

## 2017-10-02 NOTE — Addendum Note (Signed)
Addended by: Shela Leff on: 10/02/2017 10:42 AM   Modules accepted: Orders

## 2017-10-06 DIAGNOSIS — Z5181 Encounter for therapeutic drug level monitoring: Secondary | ICD-10-CM | POA: Diagnosis not present

## 2017-10-08 DIAGNOSIS — H5213 Myopia, bilateral: Secondary | ICD-10-CM | POA: Diagnosis not present

## 2017-10-12 DIAGNOSIS — Z5181 Encounter for therapeutic drug level monitoring: Secondary | ICD-10-CM | POA: Diagnosis not present

## 2017-10-14 DIAGNOSIS — Z5181 Encounter for therapeutic drug level monitoring: Secondary | ICD-10-CM | POA: Diagnosis not present

## 2017-10-19 DIAGNOSIS — Z5181 Encounter for therapeutic drug level monitoring: Secondary | ICD-10-CM | POA: Diagnosis not present

## 2017-11-02 DIAGNOSIS — Z5181 Encounter for therapeutic drug level monitoring: Secondary | ICD-10-CM | POA: Diagnosis not present

## 2017-11-16 DIAGNOSIS — Z5181 Encounter for therapeutic drug level monitoring: Secondary | ICD-10-CM | POA: Diagnosis not present

## 2017-11-25 DIAGNOSIS — Z5181 Encounter for therapeutic drug level monitoring: Secondary | ICD-10-CM | POA: Diagnosis not present

## 2017-11-26 ENCOUNTER — Encounter: Payer: Medicare Other | Admitting: Internal Medicine

## 2017-11-26 DIAGNOSIS — Z5181 Encounter for therapeutic drug level monitoring: Secondary | ICD-10-CM | POA: Diagnosis not present
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
## 2017-12-08 ENCOUNTER — Encounter: Payer: Self-pay | Admitting: Internal Medicine

## 2017-12-08 ENCOUNTER — Encounter: Payer: Medicare Other | Admitting: Internal Medicine

## 2018-10-11 ENCOUNTER — Emergency Department (HOSPITAL_COMMUNITY): Payer: Medicare Other

## 2018-10-11 ENCOUNTER — Encounter (HOSPITAL_COMMUNITY): Payer: Self-pay | Admitting: *Deleted

## 2018-10-11 ENCOUNTER — Inpatient Hospital Stay (HOSPITAL_COMMUNITY)
Admission: EM | Admit: 2018-10-11 | Discharge: 2018-10-13 | DRG: 854 | Disposition: A | Discharge status: Home / Self Care | Payer: Medicare Other | Attending: Internal Medicine | Admitting: Internal Medicine

## 2018-10-11 DIAGNOSIS — Z419 Encounter for procedure for purposes other than remedying health state, unspecified: Secondary | ICD-10-CM

## 2018-10-11 DIAGNOSIS — Z7982 Long term (current) use of aspirin: Secondary | ICD-10-CM

## 2018-10-11 DIAGNOSIS — K810 Acute cholecystitis: Secondary | ICD-10-CM | POA: Diagnosis present

## 2018-10-11 DIAGNOSIS — I10 Essential (primary) hypertension: Secondary | ICD-10-CM | POA: Diagnosis not present

## 2018-10-11 DIAGNOSIS — K8000 Calculus of gallbladder with acute cholecystitis without obstruction: Secondary | ICD-10-CM | POA: Diagnosis not present

## 2018-10-11 DIAGNOSIS — Z7902 Long term (current) use of antithrombotics/antiplatelets: Secondary | ICD-10-CM | POA: Diagnosis not present

## 2018-10-11 DIAGNOSIS — Z806 Family history of leukemia: Secondary | ICD-10-CM | POA: Diagnosis not present

## 2018-10-11 DIAGNOSIS — Z79899 Other long term (current) drug therapy: Secondary | ICD-10-CM | POA: Diagnosis not present

## 2018-10-11 DIAGNOSIS — A419 Sepsis, unspecified organism: Secondary | ICD-10-CM | POA: Diagnosis not present

## 2018-10-11 DIAGNOSIS — K8010 Calculus of gallbladder with chronic cholecystitis without obstruction: Secondary | ICD-10-CM | POA: Diagnosis not present

## 2018-10-11 DIAGNOSIS — F17210 Nicotine dependence, cigarettes, uncomplicated: Secondary | ICD-10-CM | POA: Diagnosis present

## 2018-10-11 DIAGNOSIS — Z8249 Family history of ischemic heart disease and other diseases of the circulatory system: Secondary | ICD-10-CM

## 2018-10-11 DIAGNOSIS — I69351 Hemiplegia and hemiparesis following cerebral infarction affecting right dominant side: Secondary | ICD-10-CM

## 2018-10-11 DIAGNOSIS — E785 Hyperlipidemia, unspecified: Secondary | ICD-10-CM | POA: Diagnosis present

## 2018-10-11 DIAGNOSIS — I69398 Other sequelae of cerebral infarction: Secondary | ICD-10-CM

## 2018-10-11 DIAGNOSIS — Z8673 Personal history of transient ischemic attack (TIA), and cerebral infarction without residual deficits: Secondary | ICD-10-CM | POA: Diagnosis not present

## 2018-10-11 DIAGNOSIS — Z20828 Contact with and (suspected) exposure to other viral communicable diseases: Secondary | ICD-10-CM | POA: Diagnosis present

## 2018-10-11 DIAGNOSIS — R509 Fever, unspecified: Secondary | ICD-10-CM

## 2018-10-11 DIAGNOSIS — R1084 Generalized abdominal pain: Secondary | ICD-10-CM | POA: Diagnosis not present

## 2018-10-11 DIAGNOSIS — G529 Cranial nerve disorder, unspecified: Secondary | ICD-10-CM | POA: Diagnosis present

## 2018-10-11 DIAGNOSIS — K8012 Calculus of gallbladder with acute and chronic cholecystitis without obstruction: Secondary | ICD-10-CM | POA: Diagnosis present

## 2018-10-11 DIAGNOSIS — Z7289 Other problems related to lifestyle: Secondary | ICD-10-CM | POA: Diagnosis not present

## 2018-10-11 DIAGNOSIS — K8020 Calculus of gallbladder without cholecystitis without obstruction: Secondary | ICD-10-CM | POA: Diagnosis not present

## 2018-10-11 DIAGNOSIS — R000 Tachycardia, unspecified: Secondary | ICD-10-CM | POA: Diagnosis not present

## 2018-10-11 DIAGNOSIS — I639 Cerebral infarction, unspecified: Secondary | ICD-10-CM | POA: Diagnosis not present

## 2018-10-11 DIAGNOSIS — R110 Nausea: Secondary | ICD-10-CM | POA: Diagnosis not present

## 2018-10-11 DIAGNOSIS — F1910 Other psychoactive substance abuse, uncomplicated: Secondary | ICD-10-CM | POA: Diagnosis not present

## 2018-10-11 DIAGNOSIS — J9811 Atelectasis: Secondary | ICD-10-CM | POA: Diagnosis not present

## 2018-10-11 DIAGNOSIS — Z9049 Acquired absence of other specified parts of digestive tract: Secondary | ICD-10-CM | POA: Diagnosis not present

## 2018-10-11 DIAGNOSIS — R0789 Other chest pain: Secondary | ICD-10-CM | POA: Diagnosis not present

## 2018-10-11 LAB — URINALYSIS, ROUTINE W REFLEX MICROSCOPIC
Bilirubin Urine: NEGATIVE
Glucose, UA: NEGATIVE mg/dL
Ketones, ur: 5 mg/dL — AB
Nitrite: NEGATIVE
Protein, ur: 30 mg/dL — AB
Specific Gravity, Urine: 1.024 (ref 1.005–1.030)
WBC, UA: >50 WBC/hpf — ABNORMAL HIGH (ref 0–5)
pH: 5 (ref 5.0–8.0)

## 2018-10-11 LAB — CBC WITH DIFFERENTIAL/PLATELET
Abs Immature Granulocytes: 0.08 10*3/uL — ABNORMAL HIGH (ref 0.00–0.07)
Basophils Absolute: 0 10*3/uL (ref 0.0–0.1)
Basophils Relative: 0 %
Eosinophils Absolute: 0 10*3/uL (ref 0.0–0.5)
Eosinophils Relative: 0 %
HCT: 38.1 % — ABNORMAL LOW (ref 39.0–52.0)
Hemoglobin: 12.1 g/dL — ABNORMAL LOW (ref 13.0–17.0)
Immature Granulocytes: 1 %
Lymphocytes Relative: 6 %
Lymphs Abs: 0.8 10*3/uL (ref 0.7–4.0)
MCH: 28.3 pg (ref 26.0–34.0)
MCHC: 31.8 g/dL (ref 30.0–36.0)
MCV: 89.2 fL (ref 80.0–100.0)
Monocytes Absolute: 1.5 10*3/uL — ABNORMAL HIGH (ref 0.1–1.0)
Monocytes Relative: 12 %
Neutro Abs: 10.5 10*3/uL — ABNORMAL HIGH (ref 1.7–7.7)
Neutrophils Relative %: 81 %
Platelets: 231 10*3/uL (ref 150–400)
RBC: 4.27 MIL/uL (ref 4.22–5.81)
RDW: 12.5 % (ref 11.5–15.5)
WBC: 12.9 10*3/uL — ABNORMAL HIGH (ref 4.0–10.5)
nRBC: 0 % (ref 0.0–0.2)

## 2018-10-11 LAB — COMPREHENSIVE METABOLIC PANEL
ALT: 30 U/L (ref 0–44)
AST: 22 U/L (ref 15–41)
Albumin: 2.9 g/dL — ABNORMAL LOW (ref 3.5–5.0)
Alkaline Phosphatase: 70 U/L (ref 38–126)
Anion gap: 10 (ref 5–15)
BUN: 12 mg/dL (ref 8–23)
CO2: 24 mmol/L (ref 22–32)
Calcium: 8.9 mg/dL (ref 8.9–10.3)
Chloride: 102 mmol/L (ref 98–111)
Creatinine, Ser: 1.34 mg/dL — ABNORMAL HIGH (ref 0.61–1.24)
GFR calc Af Amer: >60 mL/min (ref >60)
GFR calc non Af Amer: 55 mL/min — ABNORMAL LOW (ref >60)
Glucose, Bld: 106 mg/dL — ABNORMAL HIGH (ref 70–99)
Potassium: 3.7 mmol/L (ref 3.5–5.1)
Sodium: 136 mmol/L (ref 135–145)
Total Bilirubin: 1.3 mg/dL — ABNORMAL HIGH (ref 0.3–1.2)
Total Protein: 6.9 g/dL (ref 6.5–8.1)

## 2018-10-11 LAB — SARS CORONAVIRUS 2: SARS Coronavirus 2: NOT DETECTED

## 2018-10-11 LAB — SURGICAL PCR SCREEN
MRSA, PCR: NEGATIVE
Staphylococcus aureus: NEGATIVE

## 2018-10-11 LAB — LIPASE, BLOOD: Lipase: 38 U/L (ref 11–51)

## 2018-10-11 LAB — LACTIC ACID, PLASMA
Lactic Acid, Venous: 0.9 mmol/L (ref 0.5–1.9)
Lactic Acid, Venous: 1.1 mmol/L (ref 0.5–1.9)

## 2018-10-11 MED ORDER — METRONIDAZOLE IN NACL 5-0.79 MG/ML-% IV SOLN
500.0000 mg | Freq: Once | INTRAVENOUS | Status: AC
  Administered 2018-10-11: 500 mg via INTRAVENOUS
  Filled 2018-10-11: qty 100

## 2018-10-11 MED ORDER — ACETAMINOPHEN 325 MG PO TABS
650.0000 mg | ORAL_TABLET | Freq: Four times a day (QID) | ORAL | Status: DC | PRN
  Administered 2018-10-13: 650 mg via ORAL
  Filled 2018-10-11 (×2): qty 2

## 2018-10-11 MED ORDER — VITAMIN B-1 100 MG PO TABS
100.0000 mg | ORAL_TABLET | Freq: Every day | ORAL | Status: DC
  Administered 2018-10-11 – 2018-10-13 (×2): 100 mg via ORAL
  Filled 2018-10-11 (×2): qty 1

## 2018-10-11 MED ORDER — SODIUM CHLORIDE 0.9% FLUSH: 3.0000 mL | Freq: Once | INTRAVENOUS | Status: DC

## 2018-10-11 MED ORDER — ACETAMINOPHEN 650 MG RE SUPP
650.0000 mg | Freq: Four times a day (QID) | RECTAL | Status: DC | PRN
  Administered 2018-10-11: 650 mg via RECTAL
  Filled 2018-10-11: qty 1

## 2018-10-11 MED ORDER — ENOXAPARIN SODIUM 40 MG/0.4ML ~~LOC~~ SOLN
40.0000 mg | SUBCUTANEOUS | Status: DC
  Administered 2018-10-11 – 2018-10-13 (×3): 40 mg via SUBCUTANEOUS
  Filled 2018-10-11 (×3): qty 0.4

## 2018-10-11 MED ORDER — SODIUM CHLORIDE 0.9 % IV SOLN
2.0000 g | Freq: Once | INTRAVENOUS | Status: AC
  Administered 2018-10-11: 2 g via INTRAVENOUS
  Filled 2018-10-11: qty 20

## 2018-10-11 MED ORDER — SODIUM CHLORIDE 0.9 % IV SOLN: INTRAVENOUS | Status: DC

## 2018-10-11 MED ORDER — PROMETHAZINE HCL 25 MG PO TABS: 12.5000 mg | ORAL_TABLET | Freq: Four times a day (QID) | ORAL | Status: DC | PRN

## 2018-10-11 MED ORDER — ACETAMINOPHEN 325 MG PO TABS
650.0000 mg | ORAL_TABLET | Freq: Once | ORAL | Status: AC
  Administered 2018-10-11: 650 mg via ORAL
  Filled 2018-10-11: qty 2

## 2018-10-11 MED ORDER — ADULT MULTIVITAMIN W/MINERALS CH
1.0000 | ORAL_TABLET | Freq: Every day | ORAL | Status: DC
  Administered 2018-10-11 – 2018-10-13 (×2): 1 via ORAL
  Filled 2018-10-11 (×2): qty 1

## 2018-10-11 MED ORDER — LACTATED RINGERS IV SOLN
INTRAVENOUS | Status: AC
  Administered 2018-10-11: 17:00:00 via INTRAVENOUS

## 2018-10-11 MED ORDER — SENNOSIDES-DOCUSATE SODIUM 8.6-50 MG PO TABS: 1.0000 | ORAL_TABLET | Freq: Every evening | ORAL | Status: DC | PRN

## 2018-10-11 MED ORDER — FOLIC ACID 1 MG PO TABS
1.0000 mg | ORAL_TABLET | Freq: Every day | ORAL | Status: DC
  Administered 2018-10-11 – 2018-10-13 (×2): 1 mg via ORAL
  Filled 2018-10-11 (×2): qty 1

## 2018-10-11 NOTE — ED Notes (Signed)
Attempted to call report

## 2018-10-11 NOTE — ED Notes (Signed)
Pt to xray
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
## 2018-10-11 NOTE — ED Notes (Signed)
Ultrasound at bedside

## 2018-10-11 NOTE — ED Triage Notes (Signed)
Pt in via EMS c/o upper abd pain for the last two days, worse last night after eating, reports nausea last night but denies now, no history of same

## 2018-10-11 NOTE — Progress Notes (Signed)
Pt admitted to [facility] from ED abd pain.  History of a CVA in the past, some right hand weakness.
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
## 2018-10-11 NOTE — ED Notes (Signed)
Blood cultures obtained
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
## 2018-10-12 ENCOUNTER — Encounter (HOSPITAL_COMMUNITY): Payer: Self-pay | Admitting: Critical Care Medicine

## 2018-10-12 ENCOUNTER — Inpatient Hospital Stay (HOSPITAL_COMMUNITY): Payer: Medicare Other

## 2018-10-12 ENCOUNTER — Inpatient Hospital Stay (HOSPITAL_COMMUNITY): Payer: Medicare Other | Admitting: Anesthesiology

## 2018-10-12 ENCOUNTER — Encounter (HOSPITAL_COMMUNITY)
Admission: EM | Disposition: A | Discharge status: Home / Self Care | Payer: Self-pay | Source: Home / Self Care | Attending: Internal Medicine

## 2018-10-12 ENCOUNTER — Other Ambulatory Visit: Payer: Self-pay

## 2018-10-12 DIAGNOSIS — F1910 Other psychoactive substance abuse, uncomplicated: Secondary | ICD-10-CM

## 2018-10-12 DIAGNOSIS — Z9049 Acquired absence of other specified parts of digestive tract: Secondary | ICD-10-CM

## 2018-10-12 DIAGNOSIS — Z7902 Long term (current) use of antithrombotics/antiplatelets: Secondary | ICD-10-CM

## 2018-10-12 DIAGNOSIS — Z79899 Other long term (current) drug therapy: Secondary | ICD-10-CM

## 2018-10-12 DIAGNOSIS — Z7289 Other problems related to lifestyle: Secondary | ICD-10-CM

## 2018-10-12 DIAGNOSIS — I10 Essential (primary) hypertension: Secondary | ICD-10-CM

## 2018-10-12 DIAGNOSIS — Z8673 Personal history of transient ischemic attack (TIA), and cerebral infarction without residual deficits: Secondary | ICD-10-CM

## 2018-10-12 DIAGNOSIS — K8000 Calculus of gallbladder with acute cholecystitis without obstruction: Secondary | ICD-10-CM

## 2018-10-12 DIAGNOSIS — E785 Hyperlipidemia, unspecified: Secondary | ICD-10-CM

## 2018-10-12 DIAGNOSIS — Z7982 Long term (current) use of aspirin: Secondary | ICD-10-CM

## 2018-10-12 HISTORY — PX: CHOLECYSTECTOMY: SHX55

## 2018-10-12 LAB — COMPREHENSIVE METABOLIC PANEL
ALT: 30 U/L (ref 0–44)
AST: 24 U/L (ref 15–41)
Albumin: 2.3 g/dL — ABNORMAL LOW (ref 3.5–5.0)
Alkaline Phosphatase: 74 U/L (ref 38–126)
Anion gap: 9 (ref 5–15)
BUN: 13 mg/dL (ref 8–23)
CO2: 24 mmol/L (ref 22–32)
Calcium: 8.6 mg/dL — ABNORMAL LOW (ref 8.9–10.3)
Chloride: 104 mmol/L (ref 98–111)
Creatinine, Ser: 1.19 mg/dL (ref 0.61–1.24)
GFR calc Af Amer: >60 mL/min (ref >60)
GFR calc non Af Amer: >60 mL/min (ref >60)
Glucose, Bld: 97 mg/dL (ref 70–99)
Potassium: 3.7 mmol/L (ref 3.5–5.1)
Sodium: 137 mmol/L (ref 135–145)
Total Bilirubin: 1.9 mg/dL — ABNORMAL HIGH (ref 0.3–1.2)
Total Protein: 5.9 g/dL — ABNORMAL LOW (ref 6.5–8.1)

## 2018-10-12 LAB — CBC
HCT: 32.6 % — ABNORMAL LOW (ref 39.0–52.0)
Hemoglobin: 10.6 g/dL — ABNORMAL LOW (ref 13.0–17.0)
MCH: 28.7 pg (ref 26.0–34.0)
MCHC: 32.5 g/dL (ref 30.0–36.0)
MCV: 88.3 fL (ref 80.0–100.0)
Platelets: 199 10*3/uL (ref 150–400)
RBC: 3.69 MIL/uL — ABNORMAL LOW (ref 4.22–5.81)
RDW: 12.8 % (ref 11.5–15.5)
WBC: 11.9 10*3/uL — ABNORMAL HIGH (ref 4.0–10.5)
nRBC: 0 % (ref 0.0–0.2)

## 2018-10-12 LAB — HIV ANTIBODY (ROUTINE TESTING W REFLEX): HIV Screen 4th Generation wRfx: NONREACTIVE

## 2018-10-12 SURGERY — LAPAROSCOPIC CHOLECYSTECTOMY WITH INTRAOPERATIVE CHOLANGIOGRAM
Anesthesia: General

## 2018-10-12 MED ORDER — BUPIVACAINE-EPINEPHRINE 0.25% -1:200000 IJ SOLN
INTRAMUSCULAR | Status: DC | PRN
  Administered 2018-10-12: 7 mL

## 2018-10-12 MED ORDER — SODIUM CHLORIDE 0.9 % IV SOLN
INTRAVENOUS | Status: DC
  Administered 2018-10-12: 08:00:00 via INTRAVENOUS

## 2018-10-12 MED ORDER — LIDOCAINE 2% (20 MG/ML) 5 ML SYRINGE
INTRAMUSCULAR | Status: DC | PRN
  Administered 2018-10-12: 80 mg via INTRAVENOUS

## 2018-10-12 MED ORDER — GABAPENTIN 300 MG PO CAPS
300.0000 mg | ORAL_CAPSULE | Freq: Once | ORAL | Status: AC
  Administered 2018-10-12: 300 mg via ORAL

## 2018-10-12 MED ORDER — ONDANSETRON 4 MG PO TBDP: 4.0000 mg | ORAL_TABLET | Freq: Three times a day (TID) | ORAL | Status: DC | PRN

## 2018-10-12 MED ORDER — SODIUM CHLORIDE 0.9 % IV SOLN
INTRAVENOUS | Status: DC | PRN
  Administered 2018-10-12: 09:00:00 4 mL

## 2018-10-12 MED ORDER — SUGAMMADEX SODIUM 200 MG/2ML IV SOLN
INTRAVENOUS | Status: DC | PRN
  Administered 2018-10-12: 200 mg via INTRAVENOUS

## 2018-10-12 MED ORDER — SODIUM CHLORIDE 0.9 % IV SOLN: 2.0000 g | Freq: Once | INTRAVENOUS | Status: DC

## 2018-10-12 MED ORDER — BUPIVACAINE-EPINEPHRINE (PF) 0.25% -1:200000 IJ SOLN
INTRAMUSCULAR | Status: AC
  Filled 2018-10-12: qty 30

## 2018-10-12 MED ORDER — ROCURONIUM BROMIDE 10 MG/ML (PF) SYRINGE
PREFILLED_SYRINGE | INTRAVENOUS | Status: DC | PRN
  Administered 2018-10-12: 40 mg via INTRAVENOUS

## 2018-10-12 MED ORDER — SODIUM CHLORIDE 0.9 % IV SOLN: INTRAVENOUS | Status: DC | PRN

## 2018-10-12 MED ORDER — DOCUSATE SODIUM 100 MG PO CAPS
100.0000 mg | ORAL_CAPSULE | Freq: Two times a day (BID) | ORAL | Status: DC
  Administered 2018-10-12 – 2018-10-13 (×2): 100 mg via ORAL
  Filled 2018-10-12 (×2): qty 1

## 2018-10-12 MED ORDER — OXYCODONE HCL 5 MG PO TABS
5.0000 mg | ORAL_TABLET | ORAL | Status: DC | PRN
  Administered 2018-10-13: 10 mg via ORAL
  Filled 2018-10-12: qty 2

## 2018-10-12 MED ORDER — FENTANYL CITRATE (PF) 250 MCG/5ML IJ SOLN
INTRAMUSCULAR | Status: AC
  Filled 2018-10-12: qty 5

## 2018-10-12 MED ORDER — SODIUM CHLORIDE 0.9 % IV SOLN
INTRAVENOUS | Status: AC
  Filled 2018-10-12: qty 20

## 2018-10-12 MED ORDER — 0.9 % SODIUM CHLORIDE (POUR BTL) OPTIME
TOPICAL | Status: DC | PRN
  Administered 2018-10-12: 1000 mL

## 2018-10-12 MED ORDER — ACETAMINOPHEN 500 MG PO TABS
1000.0000 mg | ORAL_TABLET | Freq: Once | ORAL | Status: AC
  Administered 2018-10-12: 1000 mg via ORAL

## 2018-10-12 MED ORDER — MORPHINE SULFATE (PF) 2 MG/ML IV SOLN: 1.0000 mg | INTRAVENOUS | Status: DC | PRN

## 2018-10-12 MED ORDER — LACTATED RINGERS IV SOLN
INTRAVENOUS | Status: DC | PRN
  Administered 2018-10-12: 08:00:00 via INTRAVENOUS

## 2018-10-12 MED ORDER — PROPOFOL 10 MG/ML IV BOLUS
INTRAVENOUS | Status: DC | PRN
  Administered 2018-10-12: 140 mg via INTRAVENOUS
  Administered 2018-10-12 (×3): 10 mg via INTRAVENOUS

## 2018-10-12 MED ORDER — ONDANSETRON HCL 4 MG/2ML IJ SOLN
INTRAMUSCULAR | Status: DC | PRN
  Administered 2018-10-12: 4 mg via INTRAVENOUS

## 2018-10-12 MED ORDER — LACTATED RINGERS IV SOLN
INTRAVENOUS | Status: DC
  Administered 2018-10-12: 08:00:00 via INTRAVENOUS

## 2018-10-12 MED ORDER — FENTANYL CITRATE (PF) 250 MCG/5ML IJ SOLN
INTRAMUSCULAR | Status: DC | PRN
  Administered 2018-10-12 (×2): 50 ug via INTRAVENOUS
  Administered 2018-10-12: 25 ug via INTRAVENOUS
  Administered 2018-10-12: 100 ug via INTRAVENOUS
  Administered 2018-10-12: 25 ug via INTRAVENOUS
  Administered 2018-10-12: 50 ug via INTRAVENOUS
  Administered 2018-10-12: 25 ug via INTRAVENOUS

## 2018-10-12 MED ORDER — GABAPENTIN 300 MG PO CAPS
ORAL_CAPSULE | ORAL | Status: AC
  Administered 2018-10-12: 300 mg via ORAL
  Filled 2018-10-12: qty 1

## 2018-10-12 MED ORDER — KETOROLAC TROMETHAMINE 30 MG/ML IJ SOLN
INTRAMUSCULAR | Status: DC | PRN
  Administered 2018-10-12: 30 mg via INTRAVENOUS

## 2018-10-12 MED ORDER — SUCCINYLCHOLINE CHLORIDE 200 MG/10ML IV SOSY
PREFILLED_SYRINGE | INTRAVENOUS | Status: DC | PRN
  Administered 2018-10-12: 100 mg via INTRAVENOUS

## 2018-10-12 MED ORDER — MIDAZOLAM HCL 5 MG/5ML IJ SOLN
INTRAMUSCULAR | Status: DC | PRN
  Administered 2018-10-12: 2 mg via INTRAVENOUS

## 2018-10-12 MED ORDER — PROPOFOL 10 MG/ML IV BOLUS
INTRAVENOUS | Status: AC
  Filled 2018-10-12: qty 20

## 2018-10-12 MED ORDER — PHENYLEPHRINE 40 MCG/ML (10ML) SYRINGE FOR IV PUSH (FOR BLOOD PRESSURE SUPPORT)
PREFILLED_SYRINGE | INTRAVENOUS | Status: DC | PRN
  Administered 2018-10-12: 40 ug via INTRAVENOUS
  Administered 2018-10-12 (×2): 80 ug via INTRAVENOUS
  Administered 2018-10-12: 40 ug via INTRAVENOUS

## 2018-10-12 MED ORDER — ACETAMINOPHEN 500 MG PO TABS
ORAL_TABLET | ORAL | Status: AC
  Administered 2018-10-12: 1000 mg via ORAL
  Filled 2018-10-12: qty 2

## 2018-10-12 MED ORDER — DEXAMETHASONE SODIUM PHOSPHATE 10 MG/ML IJ SOLN
INTRAMUSCULAR | Status: DC | PRN
  Administered 2018-10-12: 4 mg via INTRAVENOUS

## 2018-10-12 MED ORDER — MIDAZOLAM HCL 2 MG/2ML IJ SOLN
INTRAMUSCULAR | Status: AC
  Filled 2018-10-12: qty 2

## 2018-10-12 SURGICAL SUPPLY — 50 items
APPLIER CLIP ROT 10 11.4 M/L (STAPLE) ×3
BENZOIN TINCTURE PRP APPL 2/3 (GAUZE/BANDAGES/DRESSINGS) ×3 IMPLANT
BLADE CLIPPER SURG (BLADE) ×3 IMPLANT
CANISTER SUCT 3000ML PPV (MISCELLANEOUS) ×3 IMPLANT
CHLORAPREP W/TINT 26ML (MISCELLANEOUS) ×3 IMPLANT
CLIP APPLIE ROT 10 11.4 M/L (STAPLE) ×1 IMPLANT
CLOSURE WOUND 1/2 X4 (GAUZE/BANDAGES/DRESSINGS) ×1
COVER MAYO STAND STRL (DRAPES) ×3 IMPLANT
COVER SURGICAL LIGHT HANDLE (MISCELLANEOUS) ×3 IMPLANT
COVER WAND RF STERILE (DRAPES) ×3 IMPLANT
DERMABOND ADVANCED (GAUZE/BANDAGES/DRESSINGS) ×2
DERMABOND ADVANCED .7 DNX12 (GAUZE/BANDAGES/DRESSINGS) ×1 IMPLANT
DRAPE C-ARM 42X72 X-RAY (DRAPES) ×3 IMPLANT
DRSG TEGADERM 2-3/8X2-3/4 SM (GAUZE/BANDAGES/DRESSINGS) ×9 IMPLANT
DRSG TEGADERM 4X4.75 (GAUZE/BANDAGES/DRESSINGS) ×3 IMPLANT
ELECT REM PT RETURN 9FT ADLT (ELECTROSURGICAL) ×3
ELECTRODE REM PT RTRN 9FT ADLT (ELECTROSURGICAL) ×1 IMPLANT
GAUZE SPONGE 2X2 8PLY STRL LF (GAUZE/BANDAGES/DRESSINGS) ×1 IMPLANT
GLOVE BIO SURGEON STRL SZ7 (GLOVE) ×3 IMPLANT
GLOVE BIOGEL PI IND STRL 7.0 (GLOVE) ×2 IMPLANT
GLOVE BIOGEL PI IND STRL 7.5 (GLOVE) ×1 IMPLANT
GLOVE BIOGEL PI INDICATOR 7.0 (GLOVE) ×4
GLOVE BIOGEL PI INDICATOR 7.5 (GLOVE) ×2
GLOVE SURG SS PI 6.5 STRL IVOR (GLOVE) ×3 IMPLANT
GLOVE SURG SS PI 8.0 STRL IVOR (GLOVE) ×3 IMPLANT
GOWN STRL REUS W/ TWL LRG LVL3 (GOWN DISPOSABLE) ×3 IMPLANT
GOWN STRL REUS W/TWL LRG LVL3 (GOWN DISPOSABLE) ×6
KIT BASIN OR (CUSTOM PROCEDURE TRAY) ×3 IMPLANT
KIT TURNOVER KIT B (KITS) ×3 IMPLANT
NS IRRIG 1000ML POUR BTL (IV SOLUTION) ×3 IMPLANT
PAD ARMBOARD 7.5X6 YLW CONV (MISCELLANEOUS) ×3 IMPLANT
POUCH RETRIEVAL ECOSAC 10 (ENDOMECHANICALS) IMPLANT
POUCH RETRIEVAL ECOSAC 10MM (ENDOMECHANICALS)
POUCH SPECIMEN RETRIEVAL 10MM (ENDOMECHANICALS) IMPLANT
SCISSORS LAP 5X35 DISP (ENDOMECHANICALS) ×3 IMPLANT
SET CHOLANGIOGRAPH 5 50 .035 (SET/KITS/TRAYS/PACK) ×3 IMPLANT
SET IRRIG TUBING LAPAROSCOPIC (IRRIGATION / IRRIGATOR) ×3 IMPLANT
SET TUBE SMOKE EVAC HIGH FLOW (TUBING) ×3 IMPLANT
SLEEVE ENDOPATH XCEL 5M (ENDOMECHANICALS) ×3 IMPLANT
SPECIMEN JAR SMALL (MISCELLANEOUS) ×3 IMPLANT
SPONGE GAUZE 2X2 STER 10/PKG (GAUZE/BANDAGES/DRESSINGS) ×2
STRIP CLOSURE SKIN 1/2X4 (GAUZE/BANDAGES/DRESSINGS) ×2 IMPLANT
SUT MNCRL AB 4-0 PS2 18 (SUTURE) ×6 IMPLANT
TOWEL OR 17X24 6PK STRL BLUE (TOWEL DISPOSABLE) ×3 IMPLANT
TOWEL OR 17X26 10 PK STRL BLUE (TOWEL DISPOSABLE) ×3 IMPLANT
TRAY LAPAROSCOPIC MC (CUSTOM PROCEDURE TRAY) ×3 IMPLANT
TROCAR XCEL BLUNT TIP 100MML (ENDOMECHANICALS) ×3 IMPLANT
TROCAR XCEL NON-BLD 11X100MML (ENDOMECHANICALS) ×3 IMPLANT
TROCAR XCEL NON-BLD 5MMX100MML (ENDOMECHANICALS) ×3 IMPLANT
WATER STERILE IRR 1000ML POUR (IV SOLUTION) ×3 IMPLANT

## 2018-10-12 NOTE — Transfer of Care (Signed)
Immediate Anesthesia Transfer of Care Note  Patient: Andre [name]  Procedure(s) Performed: LAPAROSCOPIC CHOLECYSTECTOMY WITH INTRAOPERATIVE CHOLANGIOGRAM (N/A )  Patient Location: PACU  Anesthesia Type:General  Level of Consciousness: awake, alert  and oriented  Airway & Oxygen Therapy: Patient Spontanous Breathing and Patient connected to nasal cannula oxygen  Post-op Assessment: Report given to RN, Post -op Vital signs reviewed and stable and Patient moving all extremities X 4  Post vital signs: Reviewed and stable  Last Vitals:  Vitals Value Taken Time  BP    Temp    Pulse    Resp 16 10/12/2018 10:06 AM  SpO2    Vitals shown include unvalidated device data.  Last Pain:  Vitals:   10/12/18 0741  TempSrc: Oral  PainSc:          Complications: No apparent anesthesia complications

## 2018-10-12 NOTE — Anesthesia Postprocedure Evaluation (Signed)
Anesthesia Post Note  Patient: Andre [name]  Procedure(s) Performed: LAPAROSCOPIC CHOLECYSTECTOMY WITH INTRAOPERATIVE CHOLANGIOGRAM (N/A )     Patient location during evaluation: PACU Anesthesia Type: General Level of consciousness: awake and alert, awake and oriented Pain management: pain level controlled Vital Signs Assessment: post-procedure vital signs reviewed and stable Respiratory status: spontaneous breathing, nonlabored ventilation, respiratory function stable and patient connected to nasal cannula oxygen Cardiovascular status: blood pressure returned to baseline and stable Postop Assessment: no apparent nausea or vomiting Anesthetic complications: no    Last Vitals:  Vitals:   10/12/18 1045 10/12/18 1103  BP:  132/80  Pulse: 84 79  Resp: 20 19  Temp: (!) 36.4 C 37.1 C  SpO2: 95% 97%    Last Pain:  Vitals:   10/12/18 1103  TempSrc: Oral  PainSc:                  Catalina Gravel

## 2018-10-12 NOTE — Anesthesia Preprocedure Evaluation (Addendum)
Anesthesia Evaluation  Patient identified by MRN, date of birth, ID band Patient awake    Reviewed: Allergy & Precautions, NPO status , Patient's Chart, lab work & pertinent test results  Airway Mallampati: II  TM Distance: >3 FB Neck ROM: Full    Dental  (+) Dental Advisory Given, Edentulous Lower, Edentulous Upper 1 tooth on bottom:   Pulmonary Current Smoker,    Pulmonary exam normal breath sounds clear to auscultation       Cardiovascular hypertension, Normal cardiovascular exam Rhythm:Regular Rate:Normal     Neuro/Psych CVA, Residual Symptoms    GI/Hepatic negative GI ROS, (+)     substance abuse  alcohol use and cocaine use, Cholecystitis   Endo/Other  negative endocrine ROS  Renal/GU negative Renal ROS     Musculoskeletal negative musculoskeletal ROS (+)   Abdominal   Peds  Hematology  (+) Blood dyscrasia (Plavix), anemia ,   Anesthesia Other Findings Day of surgery medications reviewed with the patient.  Reproductive/Obstetrics                            Anesthesia Physical Anesthesia Plan  ASA: III  Anesthesia Plan: General   Post-op Pain Management:    Induction: Intravenous  PONV Risk Score and Plan: 2 and Midazolam, Dexamethasone and Ondansetron  Airway Management Planned: Oral ETT  Additional Equipment:   Intra-op Plan:   Post-operative Plan: Extubation in OR  Informed Consent: I have reviewed the patients History and Physical, chart, labs and discussed the procedure including the risks, benefits and alternatives for the proposed anesthesia with the patient or authorized representative who has indicated his/her understanding and acceptance.     Dental advisory given  Plan Discussed with: CRNA  Anesthesia Plan Comments:        Anesthesia Quick Evaluation

## 2018-10-12 NOTE — Progress Notes (Signed)
   Subjective: Mr. Andrada states that he is doing well after surgery. His abdomen is sore but does not have any pain. He has no other acute complaints.   Objective:  Vital signs in last 24 hours: Vitals:   10/12/18 1022 10/12/18 1025 10/12/18 1036 10/12/18 1045  BP:   132/80   Pulse: 82 82  84  Resp: (!) 21 17  20   Temp:    (!) 97.5 F (36.4 C)  TempSrc:      SpO2: 98% 99%  95%  Weight:      Height:       General: Lying in bed in no acute distress CV: normal rate, regular rhythm Resp: CTAB, normal WOB Abd: Bandage over laparoscopic chole incision sites without surrounding erythema or warmth, no TTP  Assessment/Plan:  Active Problems:   Acute cholecystitis  Acute Cholecystitis: POD 0 lap chole. Day 2 cipro and metronidazole. He has been afebrile for the last 12 hours. Leukocytosis has also improved to 11.9.  - Continue cipro an metronidazole - Blood culture pending - Tylenol and oxy PRN pain - Advance diet as tolerated  Hypertension: BP WNL on no antihypertensives. - Continue to monitor  History of CVA: - Will resume ASA and Plavix tomorrow.  ETOH use: - CIWA - Continue Thiamine and Folate  FEN/GI:  VTE PPX: Lovenox CODE STATUS: Full  Dispo: Anticipated discharge tomorrow.  Carroll Sage, MD 10/12/2018, 10:54 AM Pager: [phone]

## 2018-10-12 NOTE — Progress Notes (Signed)
PT Cancellation Note  Patient Details Name: MONTFORD BARG MRN: [id number] DOB: 10-08-1950   Cancelled Treatment:    Reason Eval/Treat Not Completed: Patient at procedure or test/unavailable (per chart, plan for lap chole today). Will follow-up for PT evaluation as schedule permits.  Mabeline Caras, PT, DPT Acute Rehabilitation Services  Pager [phone] Office [place] 10/12/2018, 8:47 AM

## 2018-10-12 NOTE — Anesthesia Procedure Notes (Signed)
Procedure Name: Intubation Date/Time: 10/12/2018 8:24 AM Performed by: Wilburn Cornelia, CRNA Pre-anesthesia Checklist: Patient identified, Emergency Drugs available, Suction available, Patient being monitored and Timeout performed Patient Re-evaluated:Patient Re-evaluated prior to induction Oxygen Delivery Method: Circle system utilized Preoxygenation: Pre-oxygenation with 100% oxygen Induction Type: IV induction and Rapid sequence Laryngoscope Size: Mac and 4 Grade View: Grade I Tube type: Oral Tube size: 7.5 mm Number of attempts: 1 Airway Equipment and Method: Stylet Placement Confirmation: ETT inserted through vocal cords under direct vision,  positive ETCO2,  CO2 detector and breath sounds checked- equal and bilateral Secured at: 22 cm Tube secured with: Tape Dental Injury: Teeth and Oropharynx as per pre-operative assessment

## 2018-10-12 NOTE — Op Note (Signed)
Laparoscopic Cholecystectomy with IOC Procedure Note  Indications: This patient presents with symptomatic gallbladder disease and will undergo laparoscopic cholecystectomy.  Pre-operative Diagnosis: Calculus of gallbladder with acute cholecystitis, without mention of obstruction  Post-operative Diagnosis: Same  Surgeon: Maia Petties   Assistants: Wyonia Hough, PA-C  Anesthesia: General endotracheal anesthesia  ASA Class: 2  Procedure Details  The patient was seen again in the Holding Room. The risks, benefits, complications, treatment options, and expected outcomes were discussed with the patient. The possibilities of reaction to medication, pulmonary aspiration, perforation of viscus, bleeding, recurrent infection, finding a normal gallbladder, the need for additional procedures, failure to diagnose a condition, the possible need to convert to an open procedure, and creating a complication requiring transfusion or operation were discussed with the patient. The likelihood of improving the patient's symptoms with return to their baseline status is good.  The patient and/or family concurred with the proposed plan, giving informed consent. The site of surgery properly noted. The patient was taken to Operating Room, identified as Levert Feinstein and the procedure verified as Laparoscopic Cholecystectomy with Intraoperative Cholangiogram. A Time Out was held and the above information confirmed.  Prior to the induction of general anesthesia, antibiotic prophylaxis was administered. General endotracheal anesthesia was then administered and tolerated well. After the induction, the abdomen was prepped with Chloraprep and draped in the sterile fashion. The patient was positioned in the supine position.  The patient has a previous midline laparotomy incision extending above his umbilicus.  In the left upper quadrant, I made a 5 mm incision.  We used a 5 mm Optiview trocar to enter the peritoneal  cavity.  We insufflated CO2 maintaining a maximum pressure of 15 mmHg.  Patient has significant omental adhesions in the midline.  However the right upper quadrant is clear.  We placed 2 5 mm ports in the right upper quadrant.  The initial Optiview port was upsized to an 11 mm port.  I then used scissors and blunt dissection to take down the adhesions to expose the midline.  We were able to identify the fascia just posterior to the umbilicus.  Local anesthetic agent was injected into the skin near the umbilicus and an incision made. We dissected down to the abdominal fascia with blunt dissection.  The fascia was incised vertically and we entered the peritoneal cavity bluntly.  A pursestring suture of 0-Vicryl was placed around the fascial opening.  The Hasson cannula was inserted and secured with the stay suture. All skin incisions were infiltrated with a local anesthetic agent before making the incision and placing the trocars.   We positioned the patient in reverse Trendelenburg, tilted slightly to the patient's left.  The gallbladder was identified, the fundus grasped and retracted cephalad.  The patient's gallbladder is not currently inflamed but there are signs of chronic cholecystitis.  He has omental adhesions to the surface of the gallbladder.  Adhesions were lysed bluntly and with the electrocautery where indicated, taking care not to injure any adjacent organs or viscus. The infundibulum was grasped and retracted laterally, exposing the peritoneum overlying the triangle of Calot. This was then divided and exposed in a blunt fashion. A critical view of the cystic duct and cystic artery was obtained.  The cystic duct was clearly identified and bluntly dissected circumferentially. The cystic duct was ligated with a clip distally.   An incision was made in the cystic duct and the [facility] cholangiogram catheter introduced. The catheter was secured using a clip. A  cholangiogram was then obtained which showed good  visualization of the distal and proximal biliary tree with no sign of filling defects or obstruction.  Contrast flowed easily into the duodenum. The catheter was then removed.   The cystic duct was then ligated with clips and divided. The cystic artery was identified, dissected free, ligated with clips and divided as well.   The gallbladder was dissected from the liver bed in retrograde fashion with the electrocautery. The gallbladder was removed and placed in an Eco sac. The liver bed was irrigated and inspected. Hemostasis was achieved with the electrocautery. Copious irrigation was utilized and was repeatedly aspirated until clear.  The gallbladder and Eco sac were then removed through the umbilical port site.  The pursestring suture was used to close the umbilical fascia.    We again inspected the right upper quadrant for hemostasis.  Pneumoperitoneum was released as we removed the trocars.  4-0 Monocryl was used to close the skin.   Benzoin, steri-strips, and clean dressings were applied. The patient was then extubated and brought to the recovery room in stable condition. Instrument, sponge, and needle counts were correct at closure and at the conclusion of the case.   Findings: Cholecystitis with Cholelithiasis  Estimated Blood Loss: Minimal         Drains: none         Specimens: Gallbladder           Complications: None; patient tolerated the procedure well.         Disposition: PACU - hemodynamically stable.         Condition: stable  Andre [name]. Georgette Dover, MD, [facility] Surgery  General/ Trauma Surgery Beeper [phone]  10/12/2018 9:35 AM

## 2018-10-12 NOTE — Progress Notes (Signed)
Day of Surgery   Subjective/Chief Complaint: Patient less tender today NPO since MN   Objective: Vital signs in last 24 hours: Temp:  [98.5 F (36.9 C)-103.1 F (39.5 C)] 98.5 F (36.9 C) (06/09 0400) Pulse Rate:  [82-107] 82 (06/09 0400) Resp:  [16-30] 22 (06/09 0400) BP: (120-167)/(73-89) 121/78 (06/09 0400) SpO2:  [95 %-100 %] 97 % (06/09 0400) Weight:  [76.4 kg-76.7 kg] 76.4 kg (06/09 0400) Last BM Date: 10/10/18  Intake/Output from previous day: 06/08 0701 - 06/09 0700 In: 1287.4 [I.V.:1087.4; IV Piggyback:200] Out: 100 [Urine:100] Intake/Output this shift: No intake/output data recorded.  General appearance: alert, cooperative and no distress GI: + BS, slightly distended, mildly tender in RUQ  Lab Results:  Recent Labs    10/11/18 0803 10/12/18 0417  WBC 12.9* 11.9*  HGB 12.1* 10.6*  HCT 38.1* 32.6*  PLT 231 199   BMET Recent Labs    10/11/18 0803 10/12/18 0417  NA 136 137  K 3.7 3.7  CL 102 104  CO2 24 24  GLUCOSE 106* 97  BUN 12 13  CREATININE 1.34* 1.19  CALCIUM 8.9 8.6*   Hepatic Function Latest Ref Rng & Units 10/12/2018 10/11/2018 08/30/2017  Total Protein 6.5 - 8.1 g/dL 5.9(L) 6.9 6.3(L)  Albumin 3.5 - 5.0 g/dL 2.3(L) 2.9(L) 3.6  AST 15 - 41 U/L 24 22 25   ALT 0 - 44 U/L 30 30 14(L)  Alk Phosphatase 38 - 126 U/L 74 70 55  Total Bilirubin 0.3 - 1.2 mg/dL 1.9(H) 1.3(H) 1.0    PT/INR No results for input(s): LABPROT, INR in the last 72 hours. ABG No results for input(s): PHART, HCO3 in the last 72 hours.  Invalid input(s): PCO2, PO2  Studies/Results: Dg Chest 2 View  Result Date: 10/11/2018 CLINICAL DATA:  Left-sided chest pain for several hours EXAM: CHEST - 2 VIEW COMPARISON:  08/30/2017 FINDINGS: Cardiac shadow is within normal limits. The lungs are well aerated bilaterally. No focal infiltrate or effusion is seen. Mild degenerative changes of the thoracic spine are noted. IMPRESSION: No active cardiopulmonary disease. Electronically  Signed   By: Inez Catalina M.D.   On: 10/11/2018 08:02   US Abdomen Limited Ruq  Result Date: 10/11/2018 CLINICAL DATA:  Fever.  Right upper quadrant pain. EXAM: ULTRASOUND ABDOMEN LIMITED RIGHT UPPER QUADRANT COMPARISON:  None. FINDINGS: Gallbladder: Ring down artifact is associated with the thickened gallbladder wall measuring up to 6.6 mm. No Murphy's sign or pericholecystic fluid. A 1.8 cm stone is seen in the neck of the gallbladder. Common bile duct: Diameter: 3.7 mm Liver: No focal lesion identified. Within normal limits in parenchymal echogenicity. Portal vein is patent on color Doppler imaging with normal direction of blood flow towards the liver. IMPRESSION: 1. There is a 1.8 cm stone in the neck of the gallbladder. There is gallbladder wall thickening as well. Ring down artifact is consistent with adenomyomatosis which at least partially explains the gallbladder wall thickening. No Murphy's sign or pericholecystic fluid. If there is continued clinical concern for acute cholecystitis, recommend a HIDA scan. 2. No other abnormalities. Electronically Signed   By: Dorise Bullion III M.D   On: 10/11/2018 10:18    Anti-infectives: Anti-infectives (From admission, onward)   Start     Dose/Rate Route Frequency Ordered Stop   10/11/18 0815  cefTRIAXone (ROCEPHIN) 2 g in sodium chloride 0.9 % 100 mL IVPB     2 g 200 mL/hr over 30 Minutes Intravenous  Once 10/11/18 0804 10/11/18 0904  10/11/18 0815  metroNIDAZOLE (FLAGYL) IVPB 500 mg     500 mg 100 mL/hr over 60 Minutes Intravenous  Once 10/11/18 0804 10/11/18 1024      Assessment/Plan: Acute calculus cholecystitis T.bili slightly elevated  Plan lap chole with IOC today.  The surgical procedure has been discussed with the patient.  Potential risks, benefits, alternative treatments, and expected outcomes have been explained.  All of the patient's questions at this time have been answered.  The likelihood of reaching the patient's treatment goal  is good.  The patient understand the proposed surgical procedure and wishes to proceed.   LOS: 1 day    Maia Petties 10/12/2018

## 2018-10-12 NOTE — Discharge Instructions (Signed)
Please arrive at least 30 min before your appointment to complete your check in paperwork.  If you are unable to arrive 30 min prior to your appointment time we may have to cancel or reschedule you. ° °LAPAROSCOPIC SURGERY: POST OP INSTRUCTIONS  °1. DIET: Follow a light bland diet the first 24 hours after arrival home, such as soup, liquids, crackers, etc. Be sure to include lots of fluids daily. Avoid fast food or heavy meals as your are more likely to get nauseated. Eat a low fat the next few days after surgery.  °2. Take your usually prescribed home medications unless otherwise directed. °3. PAIN CONTROL:  °1. Pain is best controlled by a usual combination of three different methods TOGETHER:  °1. Ice/Heat °2. Over the counter pain medication °3. Prescription pain medication °2. Most patients will experience some swelling and bruising around the incisions. Ice packs or heating pads (30-60 minutes up to 6 times a day) will help. Use ice for the first few days to help decrease swelling and bruising, then switch to heat to help relax tight/sore spots and speed recovery. Some people prefer to use ice alone, heat alone, alternating between ice & heat. Experiment to what works for you. Swelling and bruising can take several weeks to resolve.  °3. It is helpful to take an over-the-counter pain medication regularly for the first few weeks. Choose one of the following that works best for you:  °1. Naproxen (Aleve, etc) Two 220mg tabs twice a day °2. Ibuprofen (Advil, etc) Three 200mg tabs four times a day (every meal & bedtime) °3. Acetaminophen (Tylenol, etc) 500-650mg four times a day (every meal & bedtime) °4. A prescription for pain medication (such as oxycodone, hydrocodone, etc) should be given to you upon discharge. Take your pain medication as prescribed.  °1. If you are having problems/concerns with the prescription medicine (does not control pain, nausea, vomiting, rash, itching, etc), please call us [phone] to see if we need to switch you to a different pain medicine that will work better for you and/or control your side effect better. °2. If you need a refill on your pain medication, please contact your pharmacy. They will contact our office to request authorization. Prescriptions will not be filled after 5 pm or on week-ends. °4. Avoid getting constipated. Between the surgery and the pain medications, it is common to experience some constipation. Increasing fluid intake and taking a fiber supplement (such as Metamucil, Citrucel, FiberCon, MiraLax, etc) 1-2 times a day regularly will usually help prevent this problem from occurring. A mild laxative (prune juice, Milk of Magnesia, MiraLax, etc) should be taken according to package directions if there are no bowel movements after 48 hours.  °5. Watch out for diarrhea. If you have many loose bowel movements, simplify your diet to bland foods & liquids for a few days. Stop any stool softeners and decrease your fiber supplement. Switching to mild anti-diarrheal medications (Kayopectate, Pepto Bismol) can help. If this worsens or does not improve, please call us. °6. Wash / shower every day. You may shower over the dressings as they are waterproof. Continue to shower over incision(s) after the dressing is off. °7. Remove your waterproof bandages 5 days after surgery. You may leave the incision open to air. You may replace a dressing/Band-Aid to cover the incision for comfort if you wish.  °8. ACTIVITIES as tolerated:  °1. You may resume regular (light) daily activities beginning the next day--such as daily self-care, walking, climbing stairs--gradually   increasing activities as tolerated. If you can walk 30 minutes without difficulty, it is safe to try more intense activity such as jogging, treadmill, bicycling, low-impact aerobics, swimming, etc. 2. Save the most intensive and strenuous activity for last such as sit-ups, heavy lifting, contact sports, etc Refrain  from any heavy lifting or straining until you are off narcotics for pain control.  3. DO NOT PUSH THROUGH PAIN. Let pain be your guide: If it hurts to do something, don't do it. Pain is your body warning you to avoid that activity for another week until the pain goes down. 4. You may drive when you are no longer taking prescription pain medication, you can comfortably wear a seatbelt, and you can safely maneuver your car and apply brakes. 5. You may have sexual intercourse when it is comfortable.  9. FOLLOW UP in our office  1. Please call [facility] at [phone] to set up an appointment to see your surgeon in the office for a follow-up appointment approximately 2-3 weeks after your surgery. 2. Make sure that you call for this appointment the day you arrive home to insure a convenient appointment time.      10. IF YOU HAVE DISABILITY OR FAMILY LEAVE FORMS, BRING THEM TO THE               OFFICE FOR PROCESSING.   WHEN TO CALL us [phone]:  1. Poor pain control 2. Reactions / problems with new medications (rash/itching, nausea, etc)  3. Fever over 101.5 F (38.5 C) 4. Inability to urinate 5. Nausea and/or vomiting 6. Worsening swelling or bruising 7. Continued bleeding from incision. 8. Increased pain, redness, or drainage from the incision  The clinic staff is available to answer your questions during regular business hours (8:30am-5pm). Please dont hesitate to call and ask to speak to one of our nurses for clinical concerns.  If you have a medical emergency, go to the nearest emergency room or call 911.  A surgeon from [facility] is always on call at the [facility], [place], [place], [place], [place] [address] ?  MAIN: [phone] ? TOLL FREE: [phone] ?  FAX [phone]  [email or web address]     Managing Your Pain After Surgery Without Opioids    Thank you for participating in our program  to help patients manage their pain after surgery without opioids. This is part of our effort to provide you with the best care possible, without exposing you or your family to the risk that opioids pose.  What pain can I expect after surgery? You can expect to have some pain after surgery. This is normal. The pain is typically worse the day after surgery, and quickly begins to get better. Many studies have found that many patients are able to manage their pain after surgery with Over-the-Counter (OTC) medications such as Tylenol and Motrin. If you have a condition that does not allow you to take Tylenol or Motrin, notify your surgical team.  How will I manage my pain? The best strategy for controlling your pain after surgery is around the clock pain control with Tylenol (acetaminophen) and Motrin (ibuprofen or Advil). Alternating these medications with each other allows you to maximize your pain control. In addition to Tylenol and Motrin, you can use heating pads or ice packs on your incisions to help reduce your pain.  How will I alternate your regular strength over-the-counter pain medication? You will take  a dose of pain medication every three hours. ; Start by taking 650 mg of Tylenol (2 pills of 325 mg) ; 3 hours later take 600 mg of Motrin (3 pills of 200 mg) ; 3 hours after taking the Motrin take 650 mg of Tylenol ; 3 hours after that take 600 mg of Motrin.   - 1 -  See example - if your first dose of Tylenol is at 12:00 PM   12:00 PM Tylenol 650 mg (2 pills of 325 mg)  3:00 PM Motrin 600 mg (3 pills of 200 mg)  6:00 PM Tylenol 650 mg (2 pills of 325 mg)  9:00 PM Motrin 600 mg (3 pills of 200 mg)  Continue alternating every 3 hours   We recommend that you follow this schedule around-the-clock for at least 3 days after surgery, or until you feel that it is no longer needed. Use the table on the last page of this handout to keep track of the medications you are  taking. Important: Do not take more than 3000mg  of Tylenol or 3200mg  of Motrin in a 24-hour period. Do not take ibuprofen/Motrin if you have a history of bleeding stomach ulcers, severe kidney disease, &/or actively taking a blood thinner  What if I still have pain? If you have pain that is not controlled with the over-the-counter pain medications (Tylenol and Motrin or Advil) you might have what we call breakthrough pain. You will receive a prescription for a small amount of an opioid pain medication such as Oxycodone, Tramadol, or Tylenol with Codeine. Use these opioid pills in the first 24 hours after surgery if you have breakthrough pain. Do not take more than 1 pill every 4-6 hours.  If you still have uncontrolled pain after using all opioid pills, don't hesitate to call our staff using the number provided. We will help make sure you are managing your pain in the best way possible, and if necessary, we can provide a prescription for additional pain medication.   Day 1    Time  Name of Medication Number of pills taken  Amount of Acetaminophen  Pain Level   Comments  AM PM       AM PM       AM PM       AM PM       AM PM       AM PM       AM PM       AM PM       Total Daily amount of Acetaminophen Do not take more than  3,000 mg per day      Day 2    Time  Name of Medication Number of pills taken  Amount of Acetaminophen  Pain Level   Comments  AM PM       AM PM       AM PM       AM PM       AM PM       AM PM       AM PM       AM PM       Total Daily amount of Acetaminophen Do not take more than  3,000 mg per day      Day 3    Time  Name of Medication Number of pills taken  Amount of Acetaminophen  Pain Level   Comments  AM PM       AM PM  AM PM       AM PM          AM PM       AM PM       AM PM       AM PM       Total Daily amount of Acetaminophen Do not take more than  3,000 mg per day      Day 4    Time  Name of Medication  Number of pills taken  Amount of Acetaminophen  Pain Level   Comments  AM PM       AM PM       AM PM       AM PM       AM PM       AM PM       AM PM       AM PM       Total Daily amount of Acetaminophen Do not take more than  3,000 mg per day      Day 5    Time  Name of Medication Number of pills taken  Amount of Acetaminophen  Pain Level   Comments  AM PM       AM PM       AM PM       AM PM       AM PM       AM PM       AM PM       AM PM       Total Daily amount of Acetaminophen Do not take more than  3,000 mg per day       Day 6    Time  Name of Medication Number of pills taken  Amount of Acetaminophen  Pain Level  Comments  AM PM       AM PM       AM PM       AM PM       AM PM       AM PM       AM PM       AM PM       Total Daily amount of Acetaminophen Do not take more than  3,000 mg per day      Day 7    Time  Name of Medication Number of pills taken  Amount of Acetaminophen  Pain Level   Comments  AM PM       AM PM       AM PM       AM PM       AM PM       AM PM       AM PM       AM PM       Total Daily amount of Acetaminophen Do not take more than  3,000 mg per day        For additional information about how and where to safely dispose of unused opioid medications - [employer]  Disclaimer: This document contains information and/or instructional materials adapted from [place] for the typical patient with your condition. It does not replace medical advice from your health care provider because your experience may differ from that of the typical patient. Talk to your health care provider if you have any questions about this document, your condition or your treatment plan. Adapted from [place] If you have  a gallbladder condition, you may have trouble digesting fats. Eating a low-fat diet can help reduce your symptoms, and may be helpful before and after  having surgery to remove your gallbladder (cholecystectomy). Your health care provider may recommend that you work with a diet and nutrition specialist (dietitian) to help you reduce the amount of fat in your diet. What are tips for following this plan? General guidelines  Limit your fat intake to less than 30% of your total daily calories. If you eat around 1,800 calories each day, this is less than 60 grams (g) of fat per day.  Fat is an important part of a healthy diet. Eating a low-fat diet can make it hard to maintain a healthy body weight. Ask your dietitian how much fat, calories, and other nutrients you need each day.  Eat small, frequent meals throughout the day instead of three large meals.  Drink at least 8-10 cups of fluid a day. Drink enough fluid to keep your urine clear or pale yellow.  Limit alcohol intake to no more than 1 drink a day for nonpregnant women and 2 drinks a day for men. One drink equals 12 oz of beer, 5 oz of wine, or 1 oz of hard liquor. Reading food labels  Check Nutrition Facts on food labels for the amount of fat per serving. Choose foods with less than 3 grams of fat per serving. Shopping  Choose nonfat and low-fat healthy foods. Look for the words nonfat, low fat, or fat free.  Avoid buying processed or prepackaged foods. Cooking  Cook using low-fat methods, such as baking, broiling, grilling, or boiling.  Cook with small amounts of healthy fats, such as olive oil, grapeseed oil, canola oil, or sunflower oil. What foods are recommended?   All fresh, frozen, or canned fruits and vegetables.  Whole grains.  Low-fat or non-fat (skim) milk and yogurt.  Lean meat, skinless poultry, fish, eggs, and beans.  Low-fat protein supplement powders or drinks.  Spices and herbs. What foods are not recommended?  High-fat foods. These include baked goods, fast food, fatty cuts of meat, ice cream, french toast, sweet rolls, pizza, cheese bread,  foods covered with butter, creamy sauces, or cheese.  Fried foods. These include french fries, tempura, battered fish, breaded chicken, fried breads, and sweets.  Foods with strong odors.  Foods that cause bloating and gas. Summary  A low-fat diet can be helpful if you have a gallbladder condition, or before and after gallbladder surgery.  Limit your fat intake to less than 30% of your total daily calories. This is about 60 g of fat if you eat 1,800 calories each day.  Eat small, frequent meals throughout the day instead of three large meals. This information is not intended to replace advice given to you by your health care provider. Make sure you discuss any questions you have with your health care provider. Document Released: 04/26/2013 Document Revised: 05/29/2016 Document Reviewed: 05/29/2016 Elsevier Interactive Patient Education  2019 [employer].

## 2018-10-12 NOTE — Evaluation (Signed)
Physical Therapy Evaluation Patient Details Name: Andre [name] MRN: [id number] DOB: 18-Jul-1950 Today's Date: 10/12/2018   History of Present Illness  Pt is a 68 y.o. male admitted 10/11/18 with acute cholecystitis. S/p laparoscopic cholecystectomy. 6/9. PMH includes HTN, ETOH use, CVA.    Clinical Impression  Pt presents with an overall decrease in functional mobility secondary to above. PTA, pt mod indep with SPC and lives with significant other. Today, pt ambulatory at supervision-level. Educ on abdominal precautions for comfort and importance of mobility. Pt would benefit from continued acute PT services to maximize functional mobility and independence prior to d/c home.     Follow Up Recommendations No PT follow up;Supervision - Intermittent    Equipment Recommendations  None recommended by PT    Recommendations for Other Services       Precautions / Restrictions Precautions Precautions: Fall;Other (comment) Precaution Comments: Abdominal precautions for comfort Restrictions Weight Bearing Restrictions: No      Mobility  Bed Mobility Overal bed mobility: Independent                Transfers Overall transfer level: Needs assistance Equipment used: None Transfers: Sit to/from Stand Sit to Stand: Supervision            Ambulation/Gait Ambulation/Gait assistance: Supervision;Min guard Gait Distance (Feet): 200 Feet Assistive device: IV Pole;None Gait Pattern/deviations: Step-through pattern;Decreased stride length;Trunk flexed Gait velocity: Decreased Gait velocity interpretation: <1.8 ft/sec, indicate of risk for recurrent falls General Gait Details: Slow, guarded gait pushing IV pole at supervision-level. Min guard when ambulating without UE support due to instability  Stairs            Wheelchair Mobility    Modified Rankin (Stroke Patients Only)       Balance Overall balance assessment: Needs assistance   Sitting balance-[name]:  Good       Standing balance-[name]: Fair Standing balance comment: Dynamic balance improved with single UE support                             Pertinent Vitals/Pain Pain Assessment: Faces Faces Pain Scale: Hurts a little bit Pain Location: Abdomen Pain Descriptors / Indicators: Guarding;Discomfort Pain Intervention(s): Monitored during session    Home Living Family/patient expects to be discharged to:: Private residence Living Arrangements: Spouse/significant other Available Help at Discharge: Available 24 hours/day Type of Home: House Home Access: Level entry     Home Layout: One level Home Equipment: Cane - single point Additional Comments: "I live with my lady"    Prior Function Level of Independence: Independent with assistive device(s)         Comments: Intermittent use of SPC. Pt and girlfriend do not drive; primarily use bus for transportation     Hand Dominance        Extremity/Trunk Assessment   Upper Extremity Assessment Upper Extremity Assessment: Overall WFL for tasks assessed    Lower Extremity Assessment Lower Extremity Assessment: Overall WFL for tasks assessed       Communication   Communication: No difficulties  Cognition Arousal/Alertness: Awake/alert Behavior During Therapy: WFL for tasks assessed/performed Overall Cognitive Status: Within Functional Limits for tasks assessed                                        General Comments      Exercises  Assessment/Plan    PT Assessment Patient needs continued PT services  PT Problem List Decreased activity tolerance;Decreased balance;Decreased mobility       PT Treatment Interventions DME instruction;Gait training;Stair training;Functional mobility training;Therapeutic activities;Therapeutic exercise;Balance training;Patient/family education    PT Goals (Current goals can be found in the Care Plan section)  Acute Rehab PT Goals Patient Stated  Goal: Return home tomorrow morning PT Goal Formulation: With patient Time For Goal Achievement: 10/26/18 Potential to Achieve Goals: Good    Frequency Min 3X/week   Barriers to discharge        Co-evaluation               AM-PAC PT "6 Clicks" Mobility  Outcome Measure Help needed turning from your back to your side while in a flat bed without using bedrails?: None Help needed moving from lying on your back to sitting on the side of a flat bed without using bedrails?: None Help needed moving to and from a bed to a chair (including a wheelchair)?: A Little Help needed standing up from a chair using your arms (e.g., wheelchair or bedside chair)?: A Little Help needed to walk in hospital room?: A Little Help needed climbing 3-5 steps with a railing? : A Little 6 Click Score: 20    End of Session   Activity Tolerance: Patient tolerated treatment well Patient left: in chair;with call bell/phone within reach Nurse Communication: Mobility status PT Visit Diagnosis: Other abnormalities of gait and mobility (R26.89)    Time: 1102-1117 PT Time Calculation (min) (ACUTE ONLY): 19 min   Charges:   PT Evaluation $PT Eval Moderate Complexity: [place], PT, DPT Acute Rehabilitation Services  Pager [phone] Office [place] 10/12/2018, 3:35 PM

## 2018-10-13 ENCOUNTER — Encounter (HOSPITAL_COMMUNITY): Payer: Self-pay | Admitting: Surgery

## 2018-10-13 ENCOUNTER — Telehealth: Payer: Self-pay | Admitting: Internal Medicine

## 2018-10-13 DIAGNOSIS — K8010 Calculus of gallbladder with chronic cholecystitis without obstruction: Secondary | ICD-10-CM

## 2018-10-13 LAB — CBC WITH DIFFERENTIAL/PLATELET
Abs Immature Granulocytes: 0.07 10*3/uL (ref 0.00–0.07)
Basophils Absolute: 0 10*3/uL (ref 0.0–0.1)
Basophils Relative: 0 %
Eosinophils Absolute: 0 10*3/uL (ref 0.0–0.5)
Eosinophils Relative: 0 %
HCT: 33.6 % — ABNORMAL LOW (ref 39.0–52.0)
Hemoglobin: 10.8 g/dL — ABNORMAL LOW (ref 13.0–17.0)
Immature Granulocytes: 1 %
Lymphocytes Relative: 6 %
Lymphs Abs: 0.8 10*3/uL (ref 0.7–4.0)
MCH: 28.4 pg (ref 26.0–34.0)
MCHC: 32.1 g/dL (ref 30.0–36.0)
MCV: 88.4 fL (ref 80.0–100.0)
Monocytes Absolute: 1 10*3/uL (ref 0.1–1.0)
Monocytes Relative: 8 %
Neutro Abs: 10.5 10*3/uL — ABNORMAL HIGH (ref 1.7–7.7)
Neutrophils Relative %: 85 %
Platelets: 211 10*3/uL (ref 150–400)
RBC: 3.8 MIL/uL — ABNORMAL LOW (ref 4.22–5.81)
RDW: 12.5 % (ref 11.5–15.5)
WBC: 12.4 10*3/uL — ABNORMAL HIGH (ref 4.0–10.5)
nRBC: 0 % (ref 0.0–0.2)

## 2018-10-13 LAB — BASIC METABOLIC PANEL
Anion gap: 9 (ref 5–15)
BUN: 14 mg/dL (ref 8–23)
CO2: 23 mmol/L (ref 22–32)
Calcium: 8.2 mg/dL — ABNORMAL LOW (ref 8.9–10.3)
Chloride: 103 mmol/L (ref 98–111)
Creatinine, Ser: 1.08 mg/dL (ref 0.61–1.24)
GFR calc Af Amer: >60 mL/min (ref >60)
GFR calc non Af Amer: >60 mL/min (ref >60)
Glucose, Bld: 148 mg/dL — ABNORMAL HIGH (ref 70–99)
Potassium: 3.6 mmol/L (ref 3.5–5.1)
Sodium: 135 mmol/L (ref 135–145)

## 2018-10-13 MED ORDER — ASPIRIN EC 81 MG PO TBEC
81.0000 mg | DELAYED_RELEASE_TABLET | Freq: Every day | ORAL | Status: DC
  Administered 2018-10-13: 81 mg via ORAL
  Filled 2018-10-13: qty 1

## 2018-10-13 MED ORDER — OXYCODONE HCL 5 MG PO TABS: 5.0000 mg | ORAL_TABLET | Freq: Four times a day (QID) | ORAL | 0 refills | Status: AC | PRN

## 2018-10-13 MED ORDER — POLYETHYLENE GLYCOL 3350 17 G PO PACK: 17.0000 g | PACK | Freq: Every day | ORAL | 0 refills | Status: DC

## 2018-10-13 MED ORDER — CLOPIDOGREL BISULFATE 75 MG PO TABS
75.0000 mg | ORAL_TABLET | Freq: Every day | ORAL | Status: DC
  Administered 2018-10-13: 75 mg via ORAL
  Filled 2018-10-13: qty 1

## 2018-10-13 MED ORDER — ONDANSETRON 4 MG PO TBDP: 4.0000 mg | ORAL_TABLET | Freq: Three times a day (TID) | ORAL | 0 refills | Status: DC | PRN

## 2018-10-13 MED ORDER — POLYETHYLENE GLYCOL 3350 17 G PO PACK
17.0000 g | PACK | Freq: Every day | ORAL | Status: DC
  Administered 2018-10-13: 17 g via ORAL
  Filled 2018-10-13: qty 1

## 2018-10-13 MED ORDER — SENNOSIDES-DOCUSATE SODIUM 8.6-50 MG PO TABS: 1.0000 | ORAL_TABLET | Freq: Every evening | ORAL | 0 refills | Status: DC | PRN

## 2018-10-13 MED ORDER — OXYCODONE HCL 5 MG PO TABS: 5.0000 mg | ORAL_TABLET | Freq: Four times a day (QID) | ORAL | 0 refills | Status: DC | PRN

## 2018-10-13 NOTE — Discharge Summary (Signed)
Name: Andre [name] MRN: [id number] DOB: 11/07/1950 68 y.o. PCP: Valinda Party, DO  Date of Admission: 10/11/2018  7:27 AM Date of Discharge: 10/13/2018 Attending Physician: Aldine Contes, MD  Discharge Diagnosis: 1. Chronic Cholecystitis 2. Hypertension 3. History of CVA  Discharge Medications: Allergies as of 10/13/2018   No Known Allergies     Medication List    STOP taking these medications   chlorhexidine 0.12 % solution Commonly known as:  PERIDEX   doxycycline 100 MG capsule Commonly known as:  VIBRAMYCIN     TAKE these medications   amLODipine 5 MG tablet Commonly known as:  NORVASC Take 1 tablet (5 mg total) by mouth daily.   aspirin 81 MG EC tablet Take 1 tablet (81 mg total) by mouth daily.   atorvastatin 80 MG tablet Commonly known as:  LIPITOR Take 1 tablet (80 mg total) by mouth daily at 6 PM.   clopidogrel 75 MG tablet Commonly known as:  PLAVIX Take 1 tablet (75 mg total) by mouth daily.   ondansetron 4 MG disintegrating tablet Commonly known as:  ZOFRAN-ODT Take 1 tablet (4 mg total) by mouth every 8 (eight) hours as needed for nausea or vomiting.   oxyCODONE 5 MG immediate release tablet Commonly known as:  Oxy IR/ROXICODONE Take 1 tablet (5 mg total) by mouth every 6 (six) hours as needed for up to 7 days for breakthrough pain.   polyethylene glycol 17 g packet Commonly known as:  MIRALAX / GLYCOLAX Take 17 g by mouth daily. Start taking on:  October 14, 2018   senna-docusate 8.6-50 MG tablet Commonly known as:  Senokot-S Take 1 tablet by mouth at bedtime as needed for mild constipation.       Disposition and follow-up:   AndreAndre [name] was discharged from [facility] in Stable condition.  At the hospital follow up visit please address:  1.  Surgical sites, abdominal pain  2.  Labs / imaging needed at time of follow-up: None  3.  Pending labs/ test needing follow-up: None  Follow-up  Appointments: Follow-up Information    [facility] [place] Follow up on 11/02/2018.   Specialty:  General Surgery Why:  6/30 at 830. Due to the coronavirus we are decreasing foot traffic in the office. You will receive a call from the PA at the time of your appointment. Please email a photo of your incision to [email or web address]  Contact information: [place] [address] [phone]        Kalman Shan Ratliff, DO. Go on 10/21/2018.   Specialty:  Internal Medicine Why:  Hospital Follow-up visit on Thursday 6/18 at 10:15 AM. Contact information: [place] [address] [place] by problem list: 1. Chronic Cholecystitis: Andre [name] presented with abdominal pain and nonbloody, nonbilious emesis.  In the ED he was found to have a 1.8 cm stone in the gallbladder neck associated gallbladder thickening concerning for cholecystitis.  He underwent went a scopic cholecystectomy.  Prior to discharge his pain was well-controlled with oral pain medications.  He was passing flatus but had not had a bowel movement.  He tolerated a regular diet.  2. Hypertension: His amlodipine 5 mg daily was held during admission.  His blood pressure with remained within normal limits.  This was restarted at discharge.  3. History of CVA: Please continue Plavix and aspirin at discharge.  Discharge Vitals:   BP 117/74 (BP Location: Left Arm)   Pulse 75   Temp 98.1 F (36.7 C) (Oral)   Resp 17   Ht 5\' 10"  (1.778 m)   Wt 69.8 kg   SpO2 97%   BMI 22.08 kg/m   Pertinent Labs, Studies, and Procedures:  10/11/18 RUQ Abd [place]: IMPRESSION: 1. There is a 1.8 cm stone in the neck of the gallbladder. There is gallbladder wall thickening as well. Ring down artifact is consistent with adenomyomatosis which at least partially explains the gallbladder wall thickening. No Murphy's sign or pericholecystic fluid. If there is  continued clinical concern for acute cholecystitis, recommend a HIDA scan. 2. No other abnormalities.   Discharge Instructions:   Thank you so much for allowing [place] to care for you during admission. You were found to have cholecystitis which is inflammation of the gallbladder. You underwent a laparoscopic cholecystectomy to remove the gallbladder. I am glad you are doing well after surgery. Please make sure that you are able to follow-up with the general surgeon. Please seek immediate medical care if you start to experience extreme pain that is not helped with the pain medications I am prescribing. Also seek care if you begin to have fevers.    Signed: Carroll Sage, MD 10/13/2018, 10:32 AM   Pager: [phone]

## 2018-10-13 NOTE — Progress Notes (Signed)
   Subjective: Mr. Heart reports that he is doing okay, still having some abdominal pain but that the pain medications helps. He has been using the incentive spirometer. Has had some flatus, has not had a bowel movement.   Objective:  Vital signs in last 24 hours: Vitals:   10/13/18 0024 10/13/18 0120 10/13/18 0500 10/13/18 0618  BP: 132/75 115/74  117/74  Pulse: (!) 107 98  75  Resp: 17 20  17   Temp: (!) 101 F (38.3 C) 99.6 F (37.6 C)  98.1 F (36.7 C)  TempSrc: Oral Oral  Oral  SpO2: 94% 99%  97%  Weight:   69.8 kg   Height:        General: Sitting up in the chair no acute distress Cardiac: Normal rate, regular rhythm Pulmonary: There to auscultation bilaterally, normal work of breathing Abdomen: Slightly distended abdomen with multiple bandages over surgical sites. Extremity: No lower extremity edema, erythema, or warmth noted bilaterally Psychiatry: Normal mood, affect, behavior    Assessment/Plan:  Active Problems:   Acute cholecystitis   Chronic cholecystitis: POD 1 lap chole.  Antibiotics discontinued yesterday.  He had one fever last night to 101.  Leukocytosis has slightly increased from 11.9 to 12.4. His post-op fever is likely due to atelectasis.  Will plan on watching him through the day and if he remains afebrile will discharge home. - Blood culture NGTD - Tylenol and oxy PRN pain - Advance diet as tolerated  Hypertension: BP WNL on no antihypertensives. - Continue to monitor  History of CVA: - Will resume ASA and Plavix today.  ETOH use: - CIWA - Continue Thiamine and Folate  FEN/GI: Regular VTE PPX: Lovenox CODE STATUS: Full  Dispo: Anticipated discharge in approximately today.   Carroll Sage, MD 10/13/2018, 9:16 AM Pager: [phone]

## 2018-10-13 NOTE — Telephone Encounter (Signed)
Hosp f/u per [facility]; pt appt 06/18 1015am/NW

## 2018-10-13 NOTE — Progress Notes (Signed)
Levert Feinstein to be D/C'd  per MD order. Discussed with the patient and all questions fully answered.  VSS, Skin clean, dry and intact without evidence of skin break down, no evidence of skin tears noted.  IV catheter discontinued intact. Site without signs and symptoms of complications. Dressing and pressure applied.  An After Visit Summary was printed and given to the patient. Patient received prescription.  D/c education completed with patient/family including follow up instructions, medication list, d/c activities limitations if indicated, with other d/c instructions as indicated by MD - patient able to verbalize understanding, all questions fully answered.   Patient instructed to return to ED, call 911, or call MD for any changes in condition.   Patient to be escorted via [place], and D/C home via private auto.

## 2018-10-13 NOTE — Progress Notes (Signed)
1 Day Post-Op  Subjective: CC: Doing well. Reports pain is controlled with oral pain medications. Has some soreness around his incision sites. Has been mobilizing the halls. Voiding without difficulty. Tolerating regular diet without N/V.   Objective: Vital signs in last 24 hours: Temp:  [97.5 F (36.4 C)-101 F (38.3 C)] 98.1 F (36.7 C) (06/10 0618) Pulse Rate:  [75-107] 75 (06/10 0618) Resp:  [17-21] 17 (06/10 0618) BP: (115-136)/(74-85) 117/74 (06/10 0618) SpO2:  [94 %-100 %] 97 % (06/10 0618) Weight:  [68.8 kg-69.8 kg] 69.8 kg (06/10 0500) Last BM Date: 10/10/18  Intake/Output from previous day: 06/09 0701 - 06/10 0700 In: 1068.5 [I.V.:1068.5] Out: 725 [Urine:700; Blood:25] Intake/Output this shift: Total I/O In: -  Out: 125 [Urine:125]  PE: Gen: Awake and alert, NAD Heart: Regular Lungs: CTA b/l, normal effort Abd: Soft, ND, appropriately tender with pain around incision sites. +BS. Laparoscopic incision sites with Tegaderm overlying and appears c/d/i.  Msk: no edema   Lab Results:  Recent Labs    10/12/18 0417 10/13/18 0118  WBC 11.9* 12.4*  HGB 10.6* 10.8*  HCT 32.6* 33.6*  PLT 199 211   BMET Recent Labs    10/12/18 0417 10/13/18 0118  NA 137 135  K 3.7 3.6  CL 104 103  CO2 24 23  GLUCOSE 97 148*  BUN 13 14  CREATININE 1.19 1.08  CALCIUM 8.6* 8.2*   PT/INR No results for input(s): LABPROT, INR in the last 72 hours. CMP     Component Value Date/Time   NA 135 10/13/2018 0118   K 3.6 10/13/2018 0118   CL 103 10/13/2018 0118   CO2 23 10/13/2018 0118   GLUCOSE 148 (H) 10/13/2018 0118   BUN 14 10/13/2018 0118   CREATININE 1.08 10/13/2018 0118   CALCIUM 8.2 (L) 10/13/2018 0118   PROT 5.9 (L) 10/12/2018 0417   ALBUMIN 2.3 (L) 10/12/2018 0417   AST 24 10/12/2018 0417   ALT 30 10/12/2018 0417   ALKPHOS 74 10/12/2018 0417   BILITOT 1.9 (H) 10/12/2018 0417   GFRNONAA >60 10/13/2018 0118   GFRAA >60 10/13/2018 0118   Lipase      Component Value Date/Time   LIPASE 38 10/11/2018 0803       Studies/Results: Dg Cholangiogram Operative  Result Date: 10/12/2018 CLINICAL DATA:  Cholelithiasis EXAM: INTRAOPERATIVE CHOLANGIOGRAM TECHNIQUE: Cholangiographic images from the C-arm fluoroscopic device were submitted for interpretation post-operatively. Please see the procedural report for the amount of contrast and the fluoroscopy time utilized. COMPARISON:  Ultrasound 10/11/2018 FINDINGS: No persistent filling defects in the common duct. Intrahepatic ducts are incompletely visualized, appearing decompressed centrally. Contrast passes into the duodenum. : Negative for retained common duct stone. Electronically Signed   By: Lucrezia Europe M.D.   On: 10/12/2018 12:10   US Abdomen Limited Ruq  Result Date: 10/11/2018 CLINICAL DATA:  Fever.  Right upper quadrant pain. EXAM: ULTRASOUND ABDOMEN LIMITED RIGHT UPPER QUADRANT COMPARISON:  None. FINDINGS: Gallbladder: Ring down artifact is associated with the thickened gallbladder wall measuring up to 6.6 mm. No Murphy's sign or pericholecystic fluid. A 1.8 cm stone is seen in the neck of the gallbladder. Common bile duct: Diameter: 3.7 mm Liver: No focal lesion identified. Within normal limits in parenchymal echogenicity. Portal vein is patent on color Doppler imaging with normal direction of blood flow towards the liver. IMPRESSION: 1. There is a 1.8 cm stone in the neck of the gallbladder. There is gallbladder wall thickening as well. Ring down artifact  is consistent with adenomyomatosis which at least partially explains the gallbladder wall thickening. No Murphy's sign or pericholecystic fluid. If there is continued clinical concern for acute cholecystitis, recommend a HIDA scan. 2. No other abnormalities. Electronically Signed   By: Dorise Bullion III M.D   On: 10/11/2018 10:18    Anti-infectives: Anti-infectives (From admission, onward)   Start     Dose/Rate Route Frequency Ordered Stop    10/12/18 0815  cefTRIAXone (ROCEPHIN) 2 g in sodium chloride 0.9 % 100 mL IVPB  Status:  Discontinued     2 g 200 mL/hr over 30 Minutes Intravenous  Once 10/12/18 0805 10/12/18 1102   10/12/18 0807  sodium chloride 0.9 % with cefTRIAXone (ROCEPHIN) ADS Med    Note to Pharmacy:  Henrine Screws   : cabinet override      10/12/18 0807 10/12/18 2014   10/11/18 0815  cefTRIAXone (ROCEPHIN) 2 g in sodium chloride 0.9 % 100 mL IVPB     2 g 200 mL/hr over 30 Minutes Intravenous  Once 10/11/18 0804 10/11/18 0904   10/11/18 0815  metroNIDAZOLE (FLAGYL) IVPB 500 mg     500 mg 100 mL/hr over 60 Minutes Intravenous  Once 10/11/18 0804 10/11/18 1024       Assessment/Plan Acute Cholecystitis - s/p Laparoscopic Cholecystectomy with IOC, 10/12/2018, Dr. Georgette Dover, POD #1 - Negative IOC - On POD 1, the patient was voiding well, tolerating diet, ambulating well, pain well controlled with oral pain medication, vital signs stable, incisions c/d/i and felt stable for discharge home from a general surgery standpoint. No additional indication for antibiotics from a surgical standpoint. Patient reports he is retired and does not need a note for work. Pain medication was sent to his pharmacy. Follow up made (see below)  FEN - Regular  VTE - Lovenox, SCD, mobilize ID - Rocephin/Flagyl 6/8-6/9  Follow-up Information    [facility] on 11/02/2018.   Specialty:  General Surgery Why:  6/30 at 830. Due to the coronavirus we are decreasing foot traffic in the office. You will receive a call from the PA at the time of your appointment. Please email a photo of your incision to [email or web address] Contact information: [place] [phone]            LOS: 2 days    Jillyn Ledger , [facility] 10/13/2018, 9:46 AM Pager: [phone]

## 2018-10-15 ENCOUNTER — Telehealth (HOSPITAL_COMMUNITY): Payer: Self-pay | Admitting: Pharmacist

## 2018-10-15 LAB — BLOOD CULTURE ID PANEL (REFLEXED)

## 2018-10-15 NOTE — Progress Notes (Signed)
Noted that patient had E. Coli grow in 1/4 blood cultures likely secondary to acute cholecystitis. He was discharged on 6/10 off of antibiotics. I left a message on his phone to check how he was doing and to return to the hospital if he began feeling unwell.   I notified his discharging physician. He has an appointment scheduled in the IM clinic on 6/18.    Jimmy Footman, PharmD, BCPS, [place] [facility] Clinical Pharmacist Phone: [phone]

## 2018-10-16 LAB — CULTURE, BLOOD (ROUTINE X 2): Culture: NO GROWTH

## 2018-10-17 LAB — CULTURE, BLOOD (ROUTINE X 2): Special Requests: ADEQUATE

## 2018-10-18 ENCOUNTER — Other Ambulatory Visit: Payer: Self-pay | Admitting: Internal Medicine

## 2018-10-18 DIAGNOSIS — B9620 Unspecified Escherichia coli [E. coli] as the cause of diseases classified elsewhere: Secondary | ICD-10-CM

## 2018-10-18 DIAGNOSIS — R7881 Bacteremia: Secondary | ICD-10-CM

## 2018-10-18 MED ORDER — SULFAMETHOXAZOLE-TRIMETHOPRIM 400-80 MG PO TABS: 2.0000 | ORAL_TABLET | Freq: Two times a day (BID) | ORAL | 0 refills | Status: AC

## 2018-10-18 NOTE — Telephone Encounter (Signed)
Transition Care Management Follow-up Telephone Call  Date of discharge and from where: 10/13/2018 from [facility]  How have you been since you were released from the hospital? "Doing pretty good, pain went away and everything is alright".  Encouraged to increase activity slowly  Any questions or concerns? Yes.  Where do I go for my appointment on the 18th and the 30th?  Pt instructed to present to [facility], ground floor on 10/21/18 for [place] appointment.  He states he knows where [facility] is located now.  Instructed to enter through main entrance/entrance A, he verbalized understanding.  Pt also informed per his discharge instructions, his appt with [facility] is on 11/02/18 @ 0830 and per d/c instructions PA will call him at home and he is to email a photo of his incision.  Pt encouraged to call [facility] for additional questions regarding telehealth appt and contact number was provided to patient, he verbalizied understanding.  Items Reviewed:  Did the pt receive and understand the discharge instructions provided? Yes  Medications obtained and verified? Yes, pt states he has picked up all new RX's.  Pt states he has not needed to take any ondansetron (he denies nausea) and has only taken is pain medicine (oxycodone) once.  He continues to take Senokot-S and Miralax as directed.  He states he has stopped taking doxycycline.  Any new allergies since your discharge? No  Dietary orders reviewed? Discharge orders recommended low heart-healthy diet.  Pt asking if he can eat fried foods.  This nurse recommended avoiding high fat, fried, greasy foods in the post-op period.  Do you have support at home? Yes  Functional Questionnaire: (I = Independent and D = Dependent) ADLs: I  Bathing/Dressing-I  Meal Prep- I Eating-I  Maintaining continence-I  Transferring/Ambulation- I Managing Meds-I  Follow up appointments reviewed:   PCP Hospital  f/u appt confirmed? Yes Scheduled to see [facility] on 10/21/18 @ 1015.  [facility] f/u appt confirmed? Yes Scheduled to see [facility] on 11/02/18 @ 0830 (PA to call patient at time of appointment due to decreasing office foot traffic because of COVID per discharge instructions.  Are transportation arrangements needed? No, Pt has a ride to drop him off   If their condition worsens, is the pt aware to call PCP or go to the Emergency Dept.? Yes  Was the patient provided with contact information for the PCP's office or ED? Yes  Was to pt encouraged to call back with questions or concerns? Yes.   Pt instructed to call MD/ER for incisional redness, tenderness, or signs of infection (pain, swelling, redness, odor or green/yellow discharge around incision site).  Call MD/ER for severe uncontrolled pain, temperature > 100.4, or inability to urinate and pt verbalized understanding.

## 2018-10-18 NOTE — Progress Notes (Signed)
Andre [name] was admitted for acute cholecystitis and underwent cholecystectomy. BCID and 1/4 Blood cx revealed E.coli. Pansensitive.  Will plan on prescribing Bactrim for 7 days. I called to notify Mr. Slone of these results and the plan on antibiotics. He states that he has had no issues since discharge. He denies fevers, chills, abdominal pain, nausea, and vomiting. He express understanding and will pick up the prescription today.

## 2018-10-21 ENCOUNTER — Other Ambulatory Visit: Payer: Self-pay

## 2018-10-21 ENCOUNTER — Encounter: Payer: Self-pay | Admitting: Internal Medicine

## 2018-10-21 ENCOUNTER — Ambulatory Visit (INDEPENDENT_AMBULATORY_CARE_PROVIDER_SITE_OTHER): Payer: Medicare Other | Admitting: Internal Medicine

## 2018-10-21 DIAGNOSIS — Z9049 Acquired absence of other specified parts of digestive tract: Secondary | ICD-10-CM

## 2018-10-21 DIAGNOSIS — Z09 Encounter for follow-up examination after completed treatment for conditions other than malignant neoplasm: Secondary | ICD-10-CM | POA: Diagnosis not present

## 2018-10-21 NOTE — Progress Notes (Signed)
   CC: hospital follow-up cholecystitis s/p cholecystectomy   HPI:  [name] is a 68 y.o. gentleman with PMHx listed below who presents for hospital follow-up following laparoscopic cholecystectomy. Since discharge, he has been feeling well. He is having very minimal pain. BMs are soft and regular. Denies fevers, chills, n/v, abdominal pain, increased warmth, erythema or drainage from surgical sites.   Past Medical History:  Diagnosis Date  . Benign essential HTN 05/09/2015  . History of blood transfusion    "qwhen I got my jaw broke" (05/05/2017)  . Hypertension   . Polysubstance abuse ([place])    including alcohol, tobacco, and cocaine /notes 05/04/2017  . Stroke ([place]) 11/12012  . Stroke ([place]) 05/04/2017   Recurrent pontine stroke with resulting right hemiplegia and right cranial nerve palsies/notes 05/04/2017   Review of Systems:  Review of Systems  All other systems reviewed and are negative.   Physical Exam:  Vitals:   10/21/18 0925  BP: 107/71  Pulse: 95  Temp: 97.6 F (36.4 C)  TempSrc: Oral  SpO2: 100%  Weight: 155 lb (70.3 kg)   Physical Exam Constitutional:      General: He is not in acute distress.    Appearance: Normal appearance.  Eyes:     Conjunctiva/sclera: Conjunctivae normal.  Cardiovascular:     Rate and Rhythm: Normal rate and regular rhythm.  Pulmonary:     Effort: Pulmonary effort is normal.     Breath sounds: Normal breath sounds.  Abdominal:     General: Bowel sounds are normal.     Palpations: Abdomen is soft.     Tenderness: There is no abdominal tenderness.     Comments: Surgical sites clean, dry and intact   Neurological:     Mental Status: He is alert.  Psychiatric:        Mood and Affect: Mood normal.        Behavior: Behavior normal.     Assessment & Plan:   See Encounters Tab for problem based charting.  Patient discussed with Dr. Daryll Drown

## 2018-10-21 NOTE — Patient Instructions (Addendum)
Andre [name], I'm glad to see you're doing so well! Please be sure to follow-up with your surgeon on June 30th.   We'll plan on seeing you back in a few months to meet your new primary care doctor.   Take care! Dr. Koleen Distance

## 2018-10-21 NOTE — Assessment & Plan Note (Signed)
S/p cholecystectomy on 10/10/18. Patient is doing well. He has not had any fevers, chills, abdominal pain, nausea or vomiting since discharge. Postop pain is very minimal. Having regular bowel movements.  He is scheduled for follow-up with general surgery on 6/30.  Can otherwise follow-up with PCP as scheduled.

## 2018-10-24 IMAGING — CT CT CERVICAL SPINE W/O CM
4 of 7 series · 13 of 33 positions shown, 14 images · non-contrast
Comparison: CT 05/04/2017

CLINICAL DATA: Patient fell out of chair. Altered level of
consciousness.

EXAM:
CT HEAD WITHOUT CONTRAST
CT CERVICAL SPINE WITHOUT CONTRAST
TECHNIQUE: Multidetector CT imaging of the head and cervical spine was
performed following the standard protocol without intravenous
contrast. Multiplanar CT image reconstructions of the cervical spine
were also generated.

[Series 6: head bone · axial · 0.45mm/px · z∈[-171,-71]mm · 4 of 84 slices shown]
[im 17/84  bone]
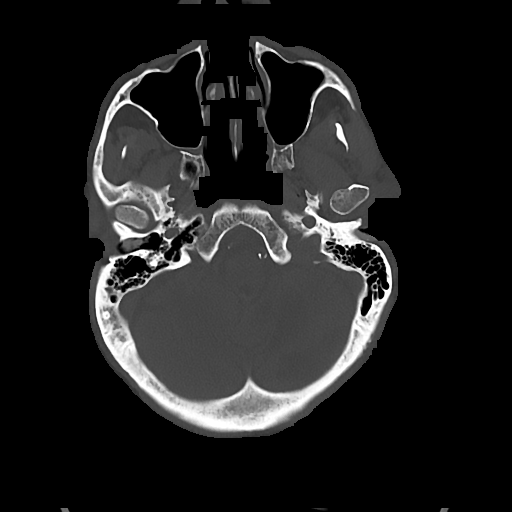
[im 34/84  bone]
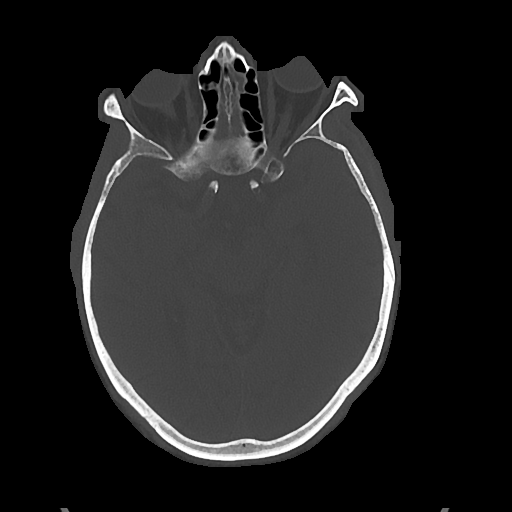
[im 50/84  bone]
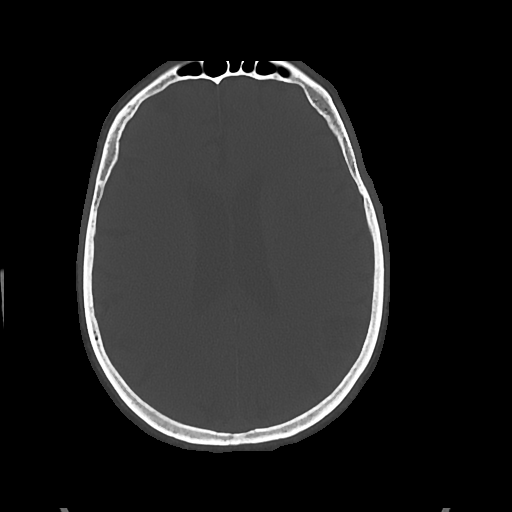
[im 67/84  bone]
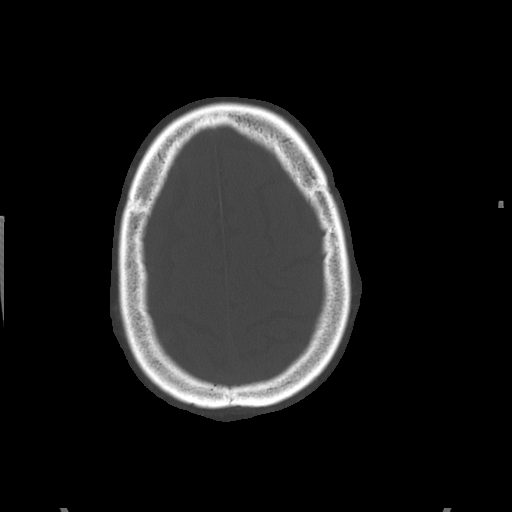

[Series 10: c spine soft · axial · 0.31mm/px · z∈[-305,-203]mm · 4 of 85 slices shown, 5 images]
[im 17/85  soft-tissue]
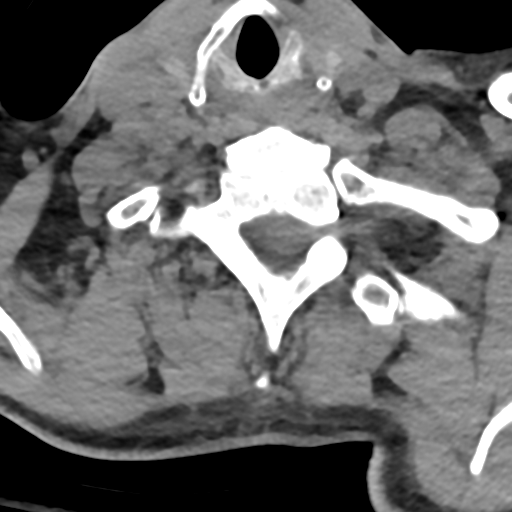
[im 17/85  bone]
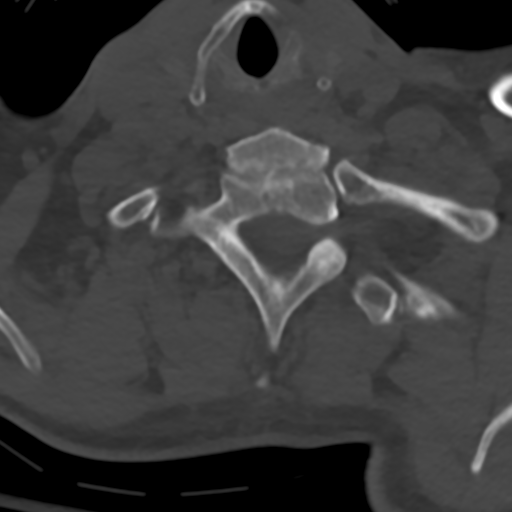
[im 34/85  bone]
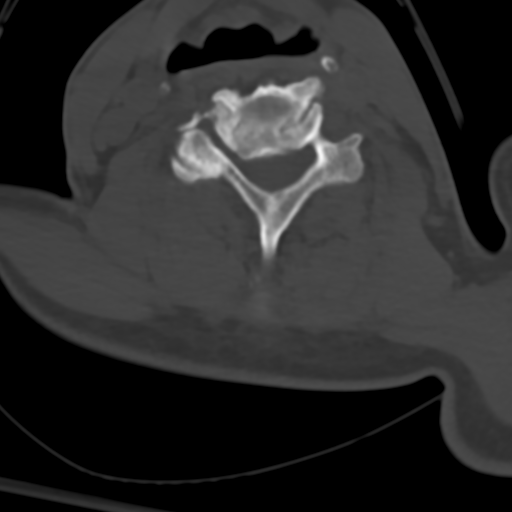
[im 51/85  bone]
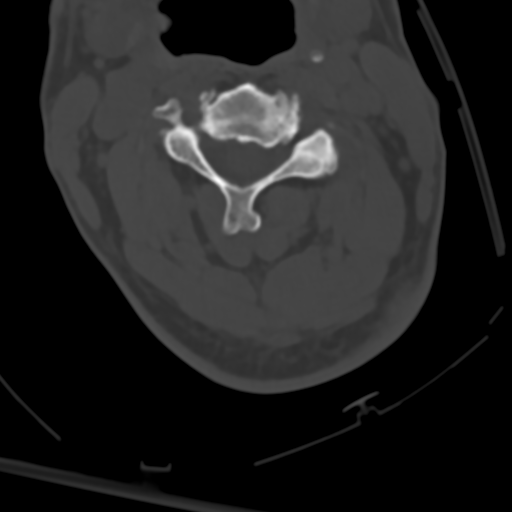
[im 68/85  bone]
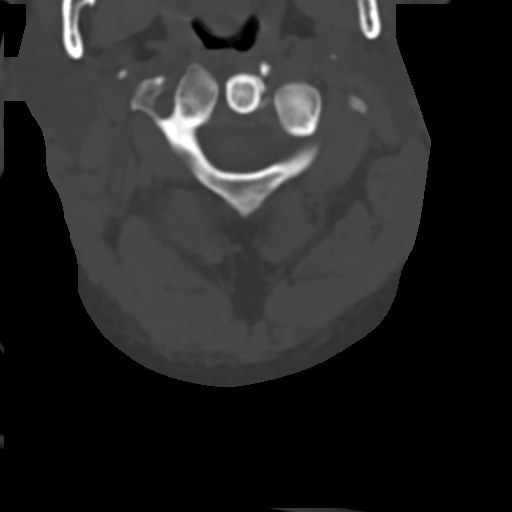

[Series 11: sag bone · sagittal · 0.32mm/px · 4 of 43 slices shown]
[im 9/43  bone]
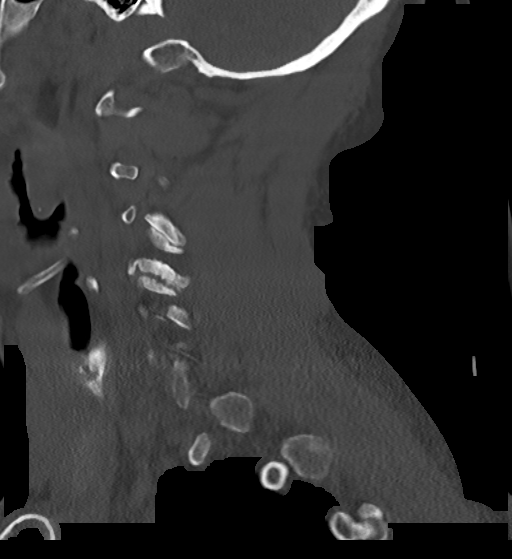
[im 17/43  bone]
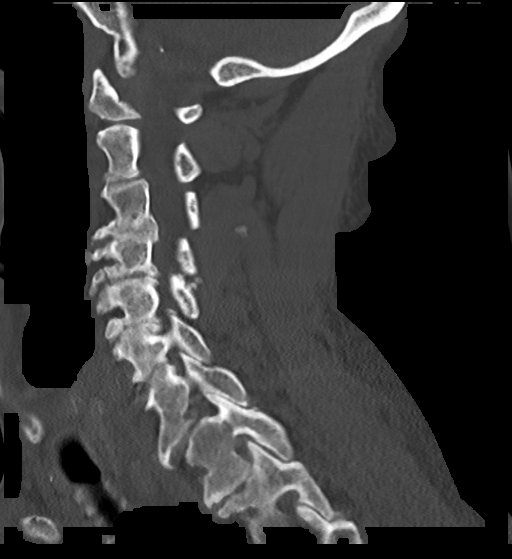
[im 26/43  bone]
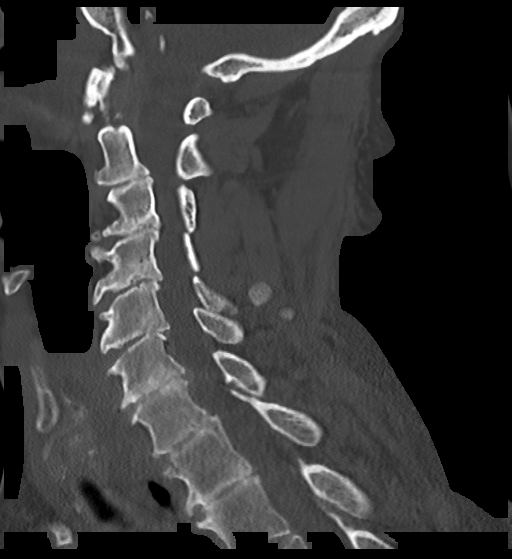
[im 34/43  bone]
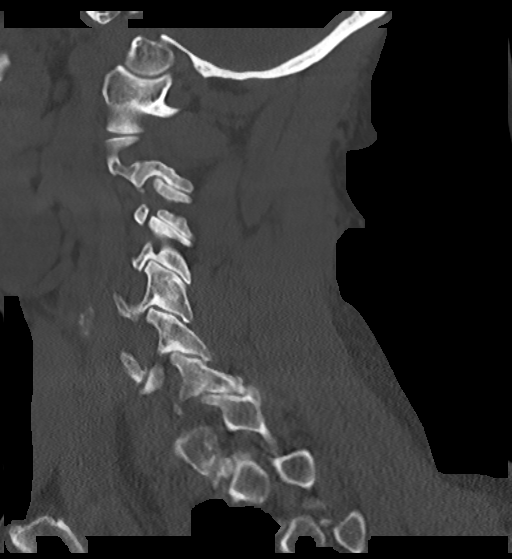

[Series 12: cor bone · coronal · 0.25mm/px · 1 of 56 slices shown]
[im 28/56  bone]
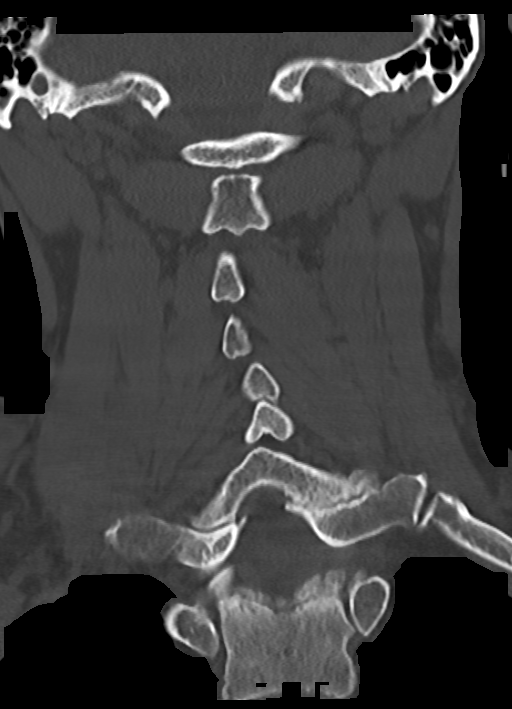

[13 of 33 positions shown; findings below may reference images not displayed]

FINDINGS: CT HEAD FINDINGS

Brain: Chronic left pontine and inferior cerebellar infarcts.
Chronic left basal ganglial lacunar infarct with chronic small
vessel ischemic disease of periventricular white matter. No
intra-axial mass nor extra-axial fluid collections. No
hydrocephalus. Midline fourth ventricle and basal cisterns.

Vascular: No hyperdense vessel sign.

Skull: Intact

Sinuses/Orbits: No acute finding.

Other: None.

CT CERVICAL SPINE FINDINGS

Alignment: Minimal grade 1 anterolisthesis of C2 on C3 and
retrolisthesis of C3 on C4 likely on the basis of degenerative disc
disease and facet arthropathy.

Skull base and vertebrae: No acute fracture. No primary bone lesion
or focal pathologic process.

Soft tissues and spinal canal: No prevertebral fluid or swelling. No
visible canal hematoma.

Disc levels: Cervical spondylosis with marked disc flattening from
C3 through T2. Disc-osteophyte complexes noted at these levels with
associated facet joint space narrowing and hypertrophy. These
contribute to bilateral foraminal encroachment. Moderate disc
flattening C2-3. Mild canal stenosis secondary to degenerative disc
disease from C2-3 through C5-6.

Upper chest: Centrilobular and paraseptal emphysema at the apices.

Other: Moderate extracranial carotid arteriosclerosis at the carotid
bulbs.
IMPRESSION: 1. Remote left pontine and cerebellar infarcts with
encephalomalacia.
2. Chronic small vessel ischemic disease and left basal ganglial
lacunar infarct.
3. No acute intracranial abnormality.
4. Cervical spondylosis without acute cervical spine fracture. Grade
1 anterolisthesis of C2 on C3 and retrolisthesis of C3 on C4
degenerative disc and facet mediated.

## 2018-10-24 IMAGING — DX DG CHEST 1V
1 series · 1 of 1 positions shown · non-contrast
Comparison: 05/06/2017

CLINICAL DATA: Chest pain. Altered mental status. History of stroke
6 months ago with right-sided deficit. Fell out of chair. Generalize
weakness.

EXAM:
CHEST  1 VIEW

[chest ap]
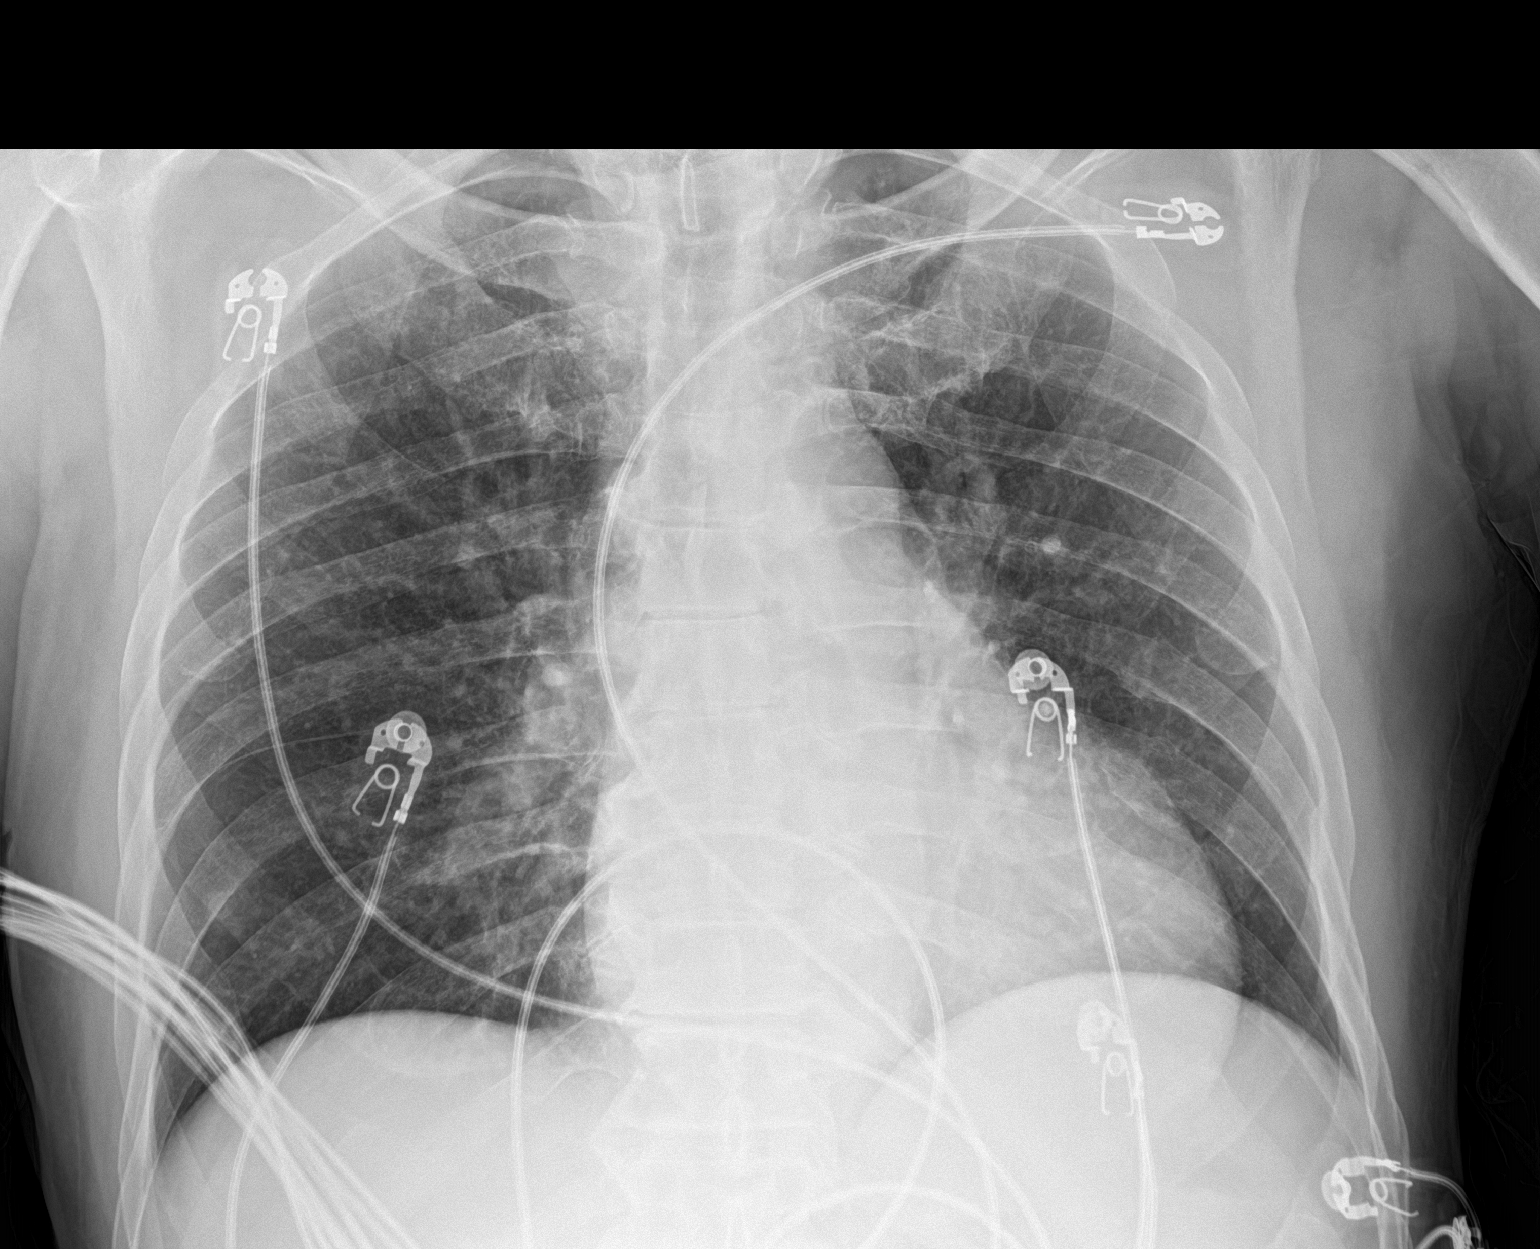

[1 of 1 positions shown; findings below may reference images not displayed]

FINDINGS: Shallow inspiration. Cardiac enlargement. No significant vascular
congestion. No edema or consolidation. No blunting of costophrenic
angles. No pneumothorax. Mediastinal contours appear intact. Old
left rib fractures.
IMPRESSION: Shallow inspiration. Cardiac enlargement. No evidence of active
pulmonary disease.

## 2018-11-01 ENCOUNTER — Encounter: Payer: Self-pay | Admitting: *Deleted

## 2018-11-08 NOTE — Progress Notes (Signed)
Internal Medicine Clinic Attending  Case discussed with Dr. Bloomfield at the time of the visit.  We reviewed the resident's history and exam and pertinent patient test results.  I agree with the assessment, diagnosis, and plan of care documented in the resident's note.  

## 2019-12-29 ENCOUNTER — Emergency Department (HOSPITAL_COMMUNITY): Payer: Medicare Other

## 2019-12-29 ENCOUNTER — Encounter (HOSPITAL_COMMUNITY): Payer: Self-pay

## 2019-12-29 ENCOUNTER — Other Ambulatory Visit: Payer: Self-pay

## 2019-12-29 ENCOUNTER — Observation Stay (HOSPITAL_COMMUNITY)
Admission: EM | Admit: 2019-12-29 | Discharge: 2019-12-30 | Disposition: A | Discharge status: Home / Self Care | Payer: Medicare Other | Attending: Internal Medicine | Admitting: Internal Medicine

## 2019-12-29 DIAGNOSIS — F17210 Nicotine dependence, cigarettes, uncomplicated: Secondary | ICD-10-CM | POA: Insufficient documentation

## 2019-12-29 DIAGNOSIS — F1910 Other psychoactive substance abuse, uncomplicated: Secondary | ICD-10-CM | POA: Diagnosis present

## 2019-12-29 DIAGNOSIS — R509 Fever, unspecified: Secondary | ICD-10-CM | POA: Diagnosis not present

## 2019-12-29 DIAGNOSIS — R6510 Systemic inflammatory response syndrome (SIRS) of non-infectious origin without acute organ dysfunction: Secondary | ICD-10-CM | POA: Diagnosis present

## 2019-12-29 DIAGNOSIS — Z79899 Other long term (current) drug therapy: Secondary | ICD-10-CM | POA: Diagnosis not present

## 2019-12-29 DIAGNOSIS — U071 COVID-19: Secondary | ICD-10-CM | POA: Diagnosis present

## 2019-12-29 DIAGNOSIS — R29810 Facial weakness: Secondary | ICD-10-CM | POA: Diagnosis not present

## 2019-12-29 DIAGNOSIS — I10 Essential (primary) hypertension: Secondary | ICD-10-CM | POA: Diagnosis present

## 2019-12-29 DIAGNOSIS — E785 Hyperlipidemia, unspecified: Secondary | ICD-10-CM | POA: Diagnosis present

## 2019-12-29 DIAGNOSIS — Z23 Encounter for immunization: Principal | ICD-10-CM | POA: Insufficient documentation

## 2019-12-29 DIAGNOSIS — A419 Sepsis, unspecified organism: Secondary | ICD-10-CM

## 2019-12-29 DIAGNOSIS — Z20822 Contact with and (suspected) exposure to covid-19: Secondary | ICD-10-CM | POA: Diagnosis not present

## 2019-12-29 DIAGNOSIS — F17200 Nicotine dependence, unspecified, uncomplicated: Secondary | ICD-10-CM | POA: Diagnosis present

## 2019-12-29 DIAGNOSIS — R000 Tachycardia, unspecified: Secondary | ICD-10-CM | POA: Diagnosis not present

## 2019-12-29 DIAGNOSIS — R6889 Other general symptoms and signs: Secondary | ICD-10-CM | POA: Diagnosis not present

## 2019-12-29 DIAGNOSIS — I499 Cardiac arrhythmia, unspecified: Secondary | ICD-10-CM | POA: Diagnosis not present

## 2019-12-29 DIAGNOSIS — Z743 Need for continuous supervision: Secondary | ICD-10-CM | POA: Diagnosis not present

## 2019-12-29 DIAGNOSIS — R531 Weakness: Secondary | ICD-10-CM | POA: Diagnosis present

## 2019-12-29 DIAGNOSIS — R0902 Hypoxemia: Secondary | ICD-10-CM | POA: Diagnosis not present

## 2019-12-29 DIAGNOSIS — Z7982 Long term (current) use of aspirin: Secondary | ICD-10-CM | POA: Diagnosis not present

## 2019-12-29 LAB — CBC WITH DIFFERENTIAL/PLATELET
Abs Immature Granulocytes: 0.02 10*3/uL (ref 0.00–0.07)
Basophils Absolute: 0 10*3/uL (ref 0.0–0.1)
Basophils Relative: 0 %
Eosinophils Absolute: 0 10*3/uL (ref 0.0–0.5)
Eosinophils Relative: 0 %
HCT: 42.2 % (ref 39.0–52.0)
Hemoglobin: 13.9 g/dL (ref 13.0–17.0)
Immature Granulocytes: 0 %
Lymphocytes Relative: 5 %
Lymphs Abs: 0.3 10*3/uL — ABNORMAL LOW (ref 0.7–4.0)
MCH: 30 pg (ref 26.0–34.0)
MCHC: 32.9 g/dL (ref 30.0–36.0)
MCV: 91.1 fL (ref 80.0–100.0)
Monocytes Absolute: 0.3 10*3/uL (ref 0.1–1.0)
Monocytes Relative: 5 %
Neutro Abs: 4.9 10*3/uL (ref 1.7–7.7)
Neutrophils Relative %: 90 %
Platelets: 109 10*3/uL — ABNORMAL LOW (ref 150–400)
RBC: 4.63 MIL/uL (ref 4.22–5.81)
RDW: 13 % (ref 11.5–15.5)
WBC: 5.5 10*3/uL (ref 4.0–10.5)
nRBC: 0 % (ref 0.0–0.2)

## 2019-12-29 LAB — COMPREHENSIVE METABOLIC PANEL
ALT: 18 U/L (ref 0–44)
AST: 44 U/L — ABNORMAL HIGH (ref 15–41)
Albumin: 3.1 g/dL — ABNORMAL LOW (ref 3.5–5.0)
Alkaline Phosphatase: 39 U/L (ref 38–126)
Anion gap: 9 (ref 5–15)
BUN: 18 mg/dL (ref 8–23)
CO2: 21 mmol/L — ABNORMAL LOW (ref 22–32)
Calcium: 8.6 mg/dL — ABNORMAL LOW (ref 8.9–10.3)
Chloride: 97 mmol/L — ABNORMAL LOW (ref 98–111)
Creatinine, Ser: 1.28 mg/dL — ABNORMAL HIGH (ref 0.61–1.24)
GFR calc Af Amer: >60 mL/min (ref >60)
GFR calc non Af Amer: 58 mL/min — ABNORMAL LOW (ref >60)
Glucose, Bld: 144 mg/dL — ABNORMAL HIGH (ref 70–99)
Potassium: 3.7 mmol/L (ref 3.5–5.1)
Sodium: 127 mmol/L — ABNORMAL LOW (ref 135–145)
Total Bilirubin: 0.6 mg/dL (ref 0.3–1.2)
Total Protein: 6.8 g/dL (ref 6.5–8.1)

## 2019-12-29 LAB — URINALYSIS, ROUTINE W REFLEX MICROSCOPIC
Bacteria, UA: NONE SEEN
Bilirubin Urine: NEGATIVE
Glucose, UA: NEGATIVE mg/dL
Ketones, ur: NEGATIVE mg/dL
Leukocytes,Ua: NEGATIVE
Nitrite: NEGATIVE
Protein, ur: NEGATIVE mg/dL
Specific Gravity, Urine: 1.009 (ref 1.005–1.030)
pH: 5 (ref 5.0–8.0)

## 2019-12-29 LAB — LACTATE DEHYDROGENASE: LDH: 172 U/L (ref 98–192)

## 2019-12-29 LAB — PROTIME-INR
INR: 1 (ref 0.8–1.2)
Prothrombin Time: 12.8 seconds (ref 11.4–15.2)

## 2019-12-29 LAB — PROCALCITONIN: Procalcitonin: 1 ng/mL

## 2019-12-29 LAB — D-DIMER, QUANTITATIVE: D-Dimer, Quant: 1.64 ug/mL-FEU — ABNORMAL HIGH (ref 0.00–0.50)

## 2019-12-29 LAB — C-REACTIVE PROTEIN: CRP: 13.5 mg/dL — ABNORMAL HIGH (ref <1.0)

## 2019-12-29 LAB — TRIGLYCERIDES: Triglycerides: 77 mg/dL (ref <150)

## 2019-12-29 LAB — LACTIC ACID, PLASMA: Lactic Acid, Venous: 1.3 mmol/L (ref 0.5–1.9)

## 2019-12-29 LAB — FIBRINOGEN: Fibrinogen: 507 mg/dL — ABNORMAL HIGH (ref 210–475)

## 2019-12-29 LAB — FERRITIN: Ferritin: 7183 ng/mL — ABNORMAL HIGH (ref 24–336)

## 2019-12-29 MED ORDER — SODIUM CHLORIDE 0.9% FLUSH
3.0000 mL | Freq: Two times a day (BID) | INTRAVENOUS | Status: DC
  Administered 2019-12-30: 3 mL via INTRAVENOUS

## 2019-12-29 MED ORDER — VANCOMYCIN HCL 1250 MG/250ML IV SOLN
1250.0000 mg | Freq: Once | INTRAVENOUS | Status: AC
  Administered 2019-12-29: 1250 mg via INTRAVENOUS
  Filled 2019-12-29: qty 250

## 2019-12-29 MED ORDER — SODIUM CHLORIDE 0.9% FLUSH: 3.0000 mL | INTRAVENOUS | Status: DC | PRN

## 2019-12-29 MED ORDER — SODIUM CHLORIDE 0.9 % IV SOLN
2.0000 g | Freq: Once | INTRAVENOUS | Status: AC
  Administered 2019-12-29: 2 g via INTRAVENOUS
  Filled 2019-12-29: qty 2

## 2019-12-29 MED ORDER — SODIUM CHLORIDE 0.9 % IV SOLN: INTRAVENOUS | Status: DC

## 2019-12-29 MED ORDER — ACETAMINOPHEN 500 MG PO TABS: 1000.0000 mg | ORAL_TABLET | Freq: Once | ORAL | Status: DC

## 2019-12-29 MED ORDER — SODIUM CHLORIDE 0.9 % IV SOLN: 250.0000 mL | INTRAVENOUS | Status: DC | PRN

## 2019-12-29 NOTE — ED Triage Notes (Signed)
Patient in from home due to unexplained weakness per family, states that it began about 1x day ago, on arrival patient unable to stand usually ambulatory, has non productive cough, patient had a fever per EMS given 100mg  tylenol in route along with 5oo ml bolus, no non covid exposure
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
## 2019-12-29 NOTE — ED Provider Notes (Signed)
Medical screening examination/treatment/procedure(s) were conducted as a shared visit with non-physician practitioner(s) and myself.  I personally evaluated the patient during the encounter.  EKG Interpretation  Date/Time:  Thursday December 29 2019 21:30:48 EDT Ventricular Rate:  109 PR Interval:    QRS Duration: 83 QT Interval:  319 QTC Calculation: 430 R Axis:   -21 Text Interpretation: Sinus tachycardia Borderline left axis deviation Probable anteroseptal infarct, old Baseline wander in lead(s) V3 since last tracing no significant change Confirmed by Daleen Bo [phone]) on 12/29/2019 10:27:27 PM  69 year old male presents with weakness and found her to have a temperature of 103.6. Sepsis called and blood work and x-rays pending. Will start antibiotics and patient will be admitted   Lacretia Leigh, MD 12/29/19 2239

## 2019-12-29 NOTE — Progress Notes (Signed)
Pharmacy Note   A consult was received from an ED physician for vancomycin and cefepime per pharmacy dosing.    The patient's profile has been reviewed for ht/wt/allergies/indication/available labs.     A one time order has been placed for vancomycin 1250 mg IV x1 and Cefepime 2 gr IV x1 .    Further antibiotics/pharmacy consults should be ordered by admitting physician if indicated.                       Thank you,   Royetta Asal, PharmD, BCPS 12/29/2019 10:03 PM

## 2019-12-30 DIAGNOSIS — U071 COVID-19: Secondary | ICD-10-CM | POA: Diagnosis present

## 2019-12-30 DIAGNOSIS — Z79899 Other long term (current) drug therapy: Secondary | ICD-10-CM | POA: Diagnosis not present

## 2019-12-30 DIAGNOSIS — F17210 Nicotine dependence, cigarettes, uncomplicated: Secondary | ICD-10-CM | POA: Diagnosis not present

## 2019-12-30 DIAGNOSIS — Z23 Encounter for immunization: Secondary | ICD-10-CM | POA: Diagnosis not present

## 2019-12-30 DIAGNOSIS — R531 Weakness: Secondary | ICD-10-CM | POA: Diagnosis present

## 2019-12-30 DIAGNOSIS — A419 Sepsis, unspecified organism: Secondary | ICD-10-CM | POA: Diagnosis not present

## 2019-12-30 DIAGNOSIS — R6510 Systemic inflammatory response syndrome (SIRS) of non-infectious origin without acute organ dysfunction: Secondary | ICD-10-CM | POA: Diagnosis present

## 2019-12-30 DIAGNOSIS — Z7982 Long term (current) use of aspirin: Secondary | ICD-10-CM | POA: Diagnosis not present

## 2019-12-30 DIAGNOSIS — I10 Essential (primary) hypertension: Secondary | ICD-10-CM | POA: Diagnosis not present

## 2019-12-30 HISTORY — DX: COVID-19: U07.1

## 2019-12-30 LAB — CBC
HCT: 41.2 % (ref 39.0–52.0)
Hemoglobin: 12.9 g/dL — ABNORMAL LOW (ref 13.0–17.0)
MCH: 29.5 pg (ref 26.0–34.0)
MCHC: 31.3 g/dL (ref 30.0–36.0)
MCV: 94.3 fL (ref 80.0–100.0)
Platelets: 102 10*3/uL — ABNORMAL LOW (ref 150–400)
RBC: 4.37 MIL/uL (ref 4.22–5.81)
RDW: 13.2 % (ref 11.5–15.5)
WBC: 5.6 10*3/uL (ref 4.0–10.5)
nRBC: 0 % (ref 0.0–0.2)

## 2019-12-30 LAB — TROPONIN I (HIGH SENSITIVITY)
Troponin I (High Sensitivity): 24 ng/L — ABNORMAL HIGH (ref <18)
Troponin I (High Sensitivity): 24 ng/L — ABNORMAL HIGH (ref <18)

## 2019-12-30 LAB — C-REACTIVE PROTEIN: CRP: 12.7 mg/dL — ABNORMAL HIGH (ref <1.0)

## 2019-12-30 LAB — HIV ANTIBODY (ROUTINE TESTING W REFLEX): HIV Screen 4th Generation wRfx: NONREACTIVE

## 2019-12-30 LAB — FERRITIN: Ferritin: >7500 ng/mL — ABNORMAL HIGH (ref 24–336)

## 2019-12-30 LAB — CREATININE, SERUM
Creatinine, Ser: 1.19 mg/dL (ref 0.61–1.24)
GFR calc Af Amer: >60 mL/min (ref >60)
GFR calc non Af Amer: >60 mL/min (ref >60)

## 2019-12-30 LAB — SARS CORONAVIRUS 2 BY RT PCR (HOSPITAL ORDER, PERFORMED IN ~~LOC~~ HOSPITAL LAB): SARS Coronavirus 2: POSITIVE — AB

## 2019-12-30 LAB — LACTATE DEHYDROGENASE: LDH: 259 U/L — ABNORMAL HIGH (ref 98–192)

## 2019-12-30 LAB — BRAIN NATRIURETIC PEPTIDE: B Natriuretic Peptide: 45.7 pg/mL (ref 0.0–100.0)

## 2019-12-30 LAB — D-DIMER, QUANTITATIVE: D-Dimer, Quant: 1.82 ug/mL-FEU — ABNORMAL HIGH (ref 0.00–0.50)

## 2019-12-30 LAB — ABO/RH: ABO/RH(D): A NEG

## 2019-12-30 LAB — PROCALCITONIN: Procalcitonin: 1.13 ng/mL

## 2019-12-30 MED ORDER — GUAIFENESIN-DM 100-10 MG/5ML PO SYRP: 10.0000 mL | ORAL_SOLUTION | ORAL | Status: DC | PRN

## 2019-12-30 MED ORDER — SODIUM CHLORIDE 0.9 % IV SOLN: INTRAVENOUS | Status: DC | PRN

## 2019-12-30 MED ORDER — EPINEPHRINE 0.3 MG/0.3ML IJ SOAJ: 0.3000 mg | Freq: Once | INTRAMUSCULAR | Status: DC | PRN

## 2019-12-30 MED ORDER — SODIUM CHLORIDE 0.9 % IV SOLN
1200.0000 mg | Freq: Once | INTRAVENOUS | Status: AC
  Administered 2019-12-30: 1200 mg via INTRAVENOUS
  Filled 2019-12-30: qty 1200

## 2019-12-30 MED ORDER — SODIUM CHLORIDE 0.9 % IV SOLN
500.0000 mg | INTRAVENOUS | Status: DC
  Administered 2019-12-30: 500 mg via INTRAVENOUS
  Filled 2019-12-30: qty 500

## 2019-12-30 MED ORDER — FAMOTIDINE IN NACL 20-0.9 MG/50ML-% IV SOLN: 20.0000 mg | Freq: Once | INTRAVENOUS | Status: DC | PRN

## 2019-12-30 MED ORDER — METHYLPREDNISOLONE SODIUM SUCC 125 MG IJ SOLR: 125.0000 mg | Freq: Once | INTRAMUSCULAR | Status: DC | PRN

## 2019-12-30 MED ORDER — POLYETHYLENE GLYCOL 3350 17 G PO PACK
17.0000 g | PACK | Freq: Every day | ORAL | Status: DC
  Administered 2019-12-30: 17 g via ORAL
  Filled 2019-12-30: qty 1

## 2019-12-30 MED ORDER — DIPHENHYDRAMINE HCL 50 MG/ML IJ SOLN: 50.0000 mg | Freq: Once | INTRAMUSCULAR | Status: DC | PRN

## 2019-12-30 MED ORDER — PREDNISONE 10 MG PO TABS: ORAL_TABLET | ORAL | 0 refills | Status: AC

## 2019-12-30 MED ORDER — ENOXAPARIN SODIUM 40 MG/0.4ML ~~LOC~~ SOLN
40.0000 mg | SUBCUTANEOUS | Status: DC
  Administered 2019-12-30: 40 mg via SUBCUTANEOUS
  Filled 2019-12-30: qty 0.4

## 2019-12-30 MED ORDER — ALBUTEROL SULFATE HFA 108 (90 BASE) MCG/ACT IN AERS: 2.0000 | INHALATION_SPRAY | Freq: Once | RESPIRATORY_TRACT | Status: DC | PRN

## 2019-12-30 MED ORDER — ONDANSETRON HCL 4 MG PO TABS: 4.0000 mg | ORAL_TABLET | Freq: Four times a day (QID) | ORAL | Status: DC | PRN

## 2019-12-30 MED ORDER — DEXAMETHASONE SODIUM PHOSPHATE 10 MG/ML IJ SOLN
6.0000 mg | INTRAMUSCULAR | Status: DC
  Administered 2019-12-30: 6 mg via INTRAVENOUS
  Filled 2019-12-30: qty 1

## 2019-12-30 MED ORDER — AMLODIPINE BESYLATE 5 MG PO TABS
5.0000 mg | ORAL_TABLET | Freq: Every day | ORAL | Status: DC
  Administered 2019-12-30: 5 mg via ORAL
  Filled 2019-12-30: qty 1

## 2019-12-30 MED ORDER — ONDANSETRON 4 MG PO TBDP: 4.0000 mg | ORAL_TABLET | Freq: Three times a day (TID) | ORAL | Status: DC | PRN

## 2019-12-30 MED ORDER — ACETAMINOPHEN 325 MG PO TABS: 650.0000 mg | ORAL_TABLET | Freq: Four times a day (QID) | ORAL | Status: DC | PRN

## 2019-12-30 MED ORDER — ONDANSETRON HCL 4 MG/2ML IJ SOLN: 4.0000 mg | Freq: Four times a day (QID) | INTRAMUSCULAR | Status: DC | PRN

## 2019-12-30 MED ORDER — HYDROCOD POLST-CPM POLST ER 10-8 MG/5ML PO SUER: 5.0000 mL | Freq: Two times a day (BID) | ORAL | Status: DC | PRN

## 2019-12-30 MED ORDER — SENNOSIDES-DOCUSATE SODIUM 8.6-50 MG PO TABS: 1.0000 | ORAL_TABLET | Freq: Every evening | ORAL | Status: DC | PRN

## 2019-12-30 NOTE — Care Management CC44 (Signed)
Condition Code 44 Documentation Completed  Patient Details  Name: Andre [name] MRN: [id number] Date of Birth: Aug 09, 1950   Condition Code 44 given:  Yes Patient signature on Condition Code 44 notice:  Yes Documentation of 2 MD's agreement:  Yes Code 44 added to claim:  Yes    Erenest Rasher, RN 12/30/2019, 2:05 PM

## 2019-12-30 NOTE — Discharge Summary (Signed)
Physician Discharge Summary  Andre [name] HCW:[id number] DOB: 05/25/1950 DOA: 12/29/2019  PCP: Andre Mercury, MD  Admit date: 12/29/2019 Discharge date: 12/30/2019  Admitted From: Home Disposition:  Home  Recommendations for Outpatient Follow-up:  1. Follow up with PCP in 1-2 weeks 2. Please obtain BMP/CBC in one week 3. Please follow up on the following pending results:  Home Health: None Equipment/Devices: None  Discharge Condition: Stable CODE STATUS: Full Diet recommendation: Regular diet as tolerated  Brief/Interim Summary: Andre [name] is a 69 y.o. male with medical history significant of hypertension, polysubstance abuse, history of CVA with residual right hemiplegia, was brought to the ER by EMS secondary to generalized weakness.  He was found to have a temperature 103 rectally.  Patient has not been vaccinated against the COVID-19 virus and was found to be positive with a virus.  He denied any cough no shortness of breath , he has slightly been feeling weak.  Patient has bronchitic changes.  He is being admitted with sepsis secondary to COVID-19 infection. In ED: Temperature 100.5 blood pressure 107/69 pulse 130 respirate 38 oxygen sats 94% room air.  Sodium 127 potassium 3.7 chloride 97 CO2 21 glucose 144 BUN is 18 creatinine 1.28.  CBC within normal.  CRP of 13.5 and ferritin 7.183.  Chest x-ray shows no acute cardiopulmonary disease.  Patient admitted as above with acutely worsening weakness fatigue lethargy and fever in the setting of COVID-19 virus infection.  Patient's chest x-ray was clear without acute pulmonary disease, patient is without hypoxia or complaints of shortness of breath.  Given patient does not meet criteria for inpatient status he was transitioned to observation status and discussed the case with the ED who agreed that monoclonal antibody therapy is certainly reasonable given patient is otherwise stable for discharge home.  Patient agreed to  Regeneron treatment, tolerated well in the ED and was subsequently discharged home.  We did discharge patient on remainder of steroid taper.  Patient otherwise was educated on need for quarantining, we did discuss 10-day versus 20-day quarantine however given he is without hypoxia and truly not admitted would recommend 10-day quarantine course consistent with outpatient therapy.  We discussed that should his respiratory status worsen he become more short of breath or have worsening symptoms we would recommend repeat evaluation with PCP or return to the ED for further evaluation.   Discharge Diagnoses:  Principal Problem:   COVID-19 virus infection Active Problems:   Polysubstance abuse (Andre [name])   Essential hypertension   Tobacco use disorder   HLD (hyperlipidemia)   SIRS (systemic inflammatory response syndrome) (Andre [name])    Discharge Instructions  Discharge Instructions    Call MD for:  difficulty breathing, headache or visual disturbances   Complete by: As directed    Increase activity slowly   Complete by: As directed      Allergies as of 12/30/2019   No Known Allergies     Medication List    TAKE these medications   amLODipine 5 MG tablet Commonly known as: NORVASC Take 1 tablet (5 mg total) by mouth daily.   aspirin 81 MG EC tablet Take 1 tablet (81 mg total) by mouth daily.   atorvastatin 80 MG tablet Commonly known as: LIPITOR Take 1 tablet (80 mg total) by mouth daily at 6 PM.   clopidogrel 75 MG tablet Commonly known as: PLAVIX Take 1 tablet (75 mg total) by mouth daily.   ondansetron 4 MG disintegrating tablet Commonly known as: ZOFRAN-ODT Take 1 tablet (  4 mg total) by mouth every 8 (eight) hours as needed for nausea or vomiting.   polyethylene glycol 17 g packet Commonly known as: MIRALAX / GLYCOLAX Take 17 g by mouth daily.   predniSONE 10 MG tablet Commonly known as: DELTASONE Take 4 tablets (40 mg total) by mouth daily for 4 days, THEN 3 tablets (30 mg  total) daily for 4 days, THEN 2 tablets (20 mg total) daily for 4 days, THEN 1 tablet (10 mg total) daily for 4 days. Start taking on: December 30, 2019   senna-docusate 8.6-50 MG tablet Commonly known as: Senokot-S Take 1 tablet by mouth at bedtime as needed for mild constipation.       No Known Allergies  Consultations:  None   Procedures/Studies: DG Chest 2 View  Result Date: 12/29/2019 CLINICAL DATA:  Sepsis, fever EXAM: CHEST - 2 VIEW COMPARISON:  10/11/2018 FINDINGS: Lungs are well expanded, symmetric, and clear. No pneumothorax or pleural effusion. Cardiac size within normal limits. Pulmonary vascularity is normal. Osseous structures are age-appropriate. No acute bone abnormality. IMPRESSION: No active cardiopulmonary disease. Electronically Signed   By: Fidela Salisbury MD   On: 12/29/2019 21:34     Subjective: No acute issues or events overnight, patient denies any shortness of breath but does continue to feel somewhat fatigued and weak.  He denies nausea, vomiting, diarrhea, constipation, headache, fevers, chills.   Discharge Exam: Vitals:   12/30/19 1454 12/30/19 1500  BP: 117/80 128/79  Pulse: 83 82  Resp: (!) 25 (!) 26  Temp:    SpO2: 97% 98%   Vitals:   12/30/19 1414 12/30/19 1430 12/30/19 1454 12/30/19 1500  BP: 125/87 117/80 117/80 128/79  Pulse: 86 88 83 82  Resp: 16 (!) 22 (!) 25 (!) 26  Temp: 98.4 F (36.9 C)     TempSrc: Oral     SpO2: 97% 97% 97% 98%  Weight:      Height:        General: Pt is alert, awake, not in acute distress Cardiovascular: RRR, S1/S2 +, no rubs, no gallops Respiratory: CTA bilaterally, no wheezing, no rhonchi Abdominal: Soft, NT, ND, bowel sounds + Extremities: no edema, no cyanosis    The results of significant diagnostics from this hospitalization (including imaging, microbiology, ancillary and laboratory) are listed below for reference.     Microbiology: Recent Results (from the past 240 hour(s))  SARS Coronavirus  2 by RT PCR (hospital order, performed in [facility] hospital lab) Nasopharyngeal Nasopharyngeal Swab     Status: Abnormal   Collection Time: 12/29/19  9:03 PM   Specimen: Nasopharyngeal Swab  Result Value Ref Range Status   SARS Coronavirus 2 POSITIVE (A) NEGATIVE Final    Comment: RESULT CALLED TO, READ BACK BY AND VERIFIED WITH: RN I HODGES AT 0002 12/30/19 CRUICKSHANK A (NOTE) SARS-CoV-2 target nucleic acids are DETECTED  SARS-CoV-2 RNA is generally detectable in upper respiratory specimens  during the acute phase of infection.  Positive results are indicative  of the presence of the identified virus, but do not rule out bacterial infection or co-infection with other pathogens not detected by the test.  Clinical correlation with patient history and  other diagnostic information is necessary to determine patient infection status.  The expected result is negative.  Fact Sheet for Patients:   StrictlyIdeas.no   Fact Sheet for Healthcare Providers:   BankingDealers.co.za    This test is not yet approved or cleared by the [place] FDA and  has been  authorized for detection and/or diagnosis of SARS-CoV-2 by FDA under an Emergency Use Authorization (EUA).  This EUA will remain in effect (meaning  this test can be used) for the duration of  the COVID-19 declaration under Section 564(b)(1) of the Act, 21 U.S.C. section 360-bbb-3(b)(1), unless the authorization is terminated or revoked sooner.  Performed at [facility], [place] [address] [place], [place] [address]   Culture, blood (Routine x 2)     Status: None (Preliminary result)   Collection Time: 12/29/19  9:20 PM   Specimen: BLOOD  Result Value Ref Range Status   Specimen Description   Final    BLOOD SPECIMEN SOURCE NOT MARKED ON REQUISITION Performed at [facility] [address], [place], [place] [address]    Special Requests   Final     BOTTLES DRAWN AEROBIC AND ANAEROBIC Blood Culture results may not be optimal due to an excessive volume of blood received in culture bottles Performed at [facility] [address] [place], [place] [address]    Culture   Final    NO GROWTH < 12 HOURS Performed at [facility] [address] [place], [place] [address]    Report Status PENDING  Incomplete  Culture, blood (Routine x 2)     Status: None (Preliminary result)   Collection Time: 12/29/19  9:56 PM   Specimen: BLOOD  Result Value Ref Range Status   Specimen Description   Final    BLOOD BLOOD LEFT FOREARM Performed at [facility] [address] [place], [place] [address]    Special Requests   Final    BOTTLES DRAWN AEROBIC AND ANAEROBIC Blood Culture adequate volume Performed at [facility] [address], [place], [place] [address]    Culture   Final    NO GROWTH < 12 HOURS Performed at [facility] [address], [place],  [address]    Report Status PENDING  Incomplete     Labs: BNP (last 3 results) Recent Labs    12/30/19 0510  BNP 02.5   Basic Metabolic Panel: Recent Labs  Lab 12/29/19 2155 12/30/19 0510  NA 127*  --   K 3.7  --   CL 97*  --   CO2 21*  --   GLUCOSE 144*  --   BUN 18  --   CREATININE 1.28* 1.19  CALCIUM 8.6*  --    Liver Function Tests: Recent Labs  Lab 12/29/19 2155  AST 44*  ALT 18  ALKPHOS 39  BILITOT 0.6  PROT 6.8  ALBUMIN 3.1*   No results for input(s): LIPASE, AMYLASE in the last 168 hours. No results for input(s): AMMONIA in the last 168 hours. CBC: Recent Labs  Lab 12/29/19 2155 12/30/19 0510  WBC 5.5 5.6  NEUTROABS 4.9  --   HGB 13.9 12.9*  HCT 42.2 41.2  MCV 91.1 94.3  PLT 109* 102*   Cardiac Enzymes: No results for input(s): CKTOTAL, CKMB, CKMBINDEX, TROPONINI in the last 168 hours. BNP: Invalid input(s): POCBNP CBG: No results for input(s): GLUCAP in the last  168 hours. D-Dimer Recent Labs    12/29/19 2155 12/30/19 0510  DDIMER 1.64* 1.82*   Hgb A1c No results for input(s): HGBA1C in the last 72 hours. Lipid Profile Recent Labs    12/29/19 2113  TRIG 77   Thyroid function studies No results for input(s): TSH, T4TOTAL, T3FREE, THYROIDAB in the last 72 hours.  Invalid input(s): FREET3 Anemia work up Recent Labs    12/29/19 2156 12/30/19 0510  FERRITIN 7,183* >7,500*   Urinalysis    Component Value Date/Time   COLORURINE YELLOW 12/29/2019 2230   APPEARANCEUR CLEAR 12/29/2019 2230   LABSPEC 1.009 12/29/2019 2230   PHURINE 5.0 12/29/2019 2230   GLUCOSEU NEGATIVE 12/29/2019 2230   HGBUR SMALL (A) 12/29/2019 2230   BILIRUBINUR NEGATIVE 12/29/2019 2230   KETONESUR NEGATIVE 12/29/2019 2230   PROTEINUR NEGATIVE 12/29/2019 2230   UROBILINOGEN 0.2 03/27/2011 1149   NITRITE NEGATIVE 12/29/2019 2230   LEUKOCYTESUR NEGATIVE 12/29/2019 2230   Sepsis Labs Invalid input(s): PROCALCITONIN,  WBC,  LACTICIDVEN Microbiology Recent Results (from the past 240 hour(s))  SARS Coronavirus 2 by RT PCR (hospital order, performed in [facility] hospital lab) Nasopharyngeal Nasopharyngeal Swab     Status: Abnormal   Collection Time: 12/29/19  9:03 PM   Specimen: Nasopharyngeal Swab  Result Value Ref Range Status   SARS Coronavirus 2 POSITIVE (A) NEGATIVE Final    Comment: RESULT CALLED TO, READ BACK BY AND VERIFIED WITH: RN I HODGES AT 0002 12/30/19 CRUICKSHANK A (NOTE) SARS-CoV-2 target nucleic acids are DETECTED  SARS-CoV-2 RNA is generally detectable in upper respiratory specimens  during the acute phase of infection.  Positive results are indicative  of the presence of the identified virus, but do not rule out bacterial infection or co-infection with other pathogens not detected by the test.  Clinical correlation with patient history and  other diagnostic information is necessary to determine patient infection status.  The expected  result is negative.  Fact Sheet for Patients:   StrictlyIdeas.no   Fact Sheet for Healthcare Providers:   BankingDealers.co.za    This test is not yet approved or cleared by the [place] FDA and  has been authorized for detection and/or diagnosis of SARS-CoV-2 by FDA under an Emergency Use Authorization (EUA).  This EUA will remain in effect (meaning  this test can be used) for the duration of  the COVID-19 declaration under Section 564(b)(1) of the Act, 21 U.S.C. section 360-bbb-3(b)(1), unless the authorization is terminated or revoked sooner.  Performed at [facility], [place] [address] [place], [place] [address]   Culture, blood (Routine x 2)     Status: None (Preliminary result)   Collection Time: 12/29/19  9:20 PM   Specimen: BLOOD  Result Value Ref Range Status   Specimen Description   Final    BLOOD SPECIMEN SOURCE NOT MARKED ON REQUISITION Performed at [facility] [address], [place], [place] [address]    Special Requests   Final    BOTTLES DRAWN AEROBIC AND ANAEROBIC Blood Culture results may not be optimal due to an excessive volume of blood received in culture bottles Performed at [facility] [address] [place], [place] [address]    Culture   Final    NO GROWTH < 12 HOURS Performed at [facility] [address], [place], [place] [address]    Report Status PENDING  Incomplete  Culture, blood (Routine x 2)     Status: None (Preliminary result)   Collection Time: 12/29/19  9:56 PM   Specimen: BLOOD  Result Value Ref Range Status   Specimen Description   Final    BLOOD BLOOD LEFT FOREARM Performed at [facility] [address] [place], [place] [address]    Special Requests   Final    BOTTLES DRAWN AEROBIC AND ANAEROBIC  Blood Culture adequate volume Performed at [facility] [address]  [place], [place] [address]    Culture   Final    NO GROWTH < 12 HOURS Performed at [facility] [address] [place], [place] [address]    Report Status PENDING  Incomplete   Time coordinating discharge: Over 30 minutes  SIGNED:  Little Ishikawa, DO [facility] 12/30/2019, 6:56 PM Pager   If 7PM-7AM, please contact night-coverage [email or web address]
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
## 2019-12-30 NOTE — Care Management Obs Status (Signed)
[facility] NOTIFICATION   Patient Details  Name: Andre [name] MRN: [id number] Date of Birth: 1951-01-13   Medicare Observation Status Notification Given:  Yes    Erenest Rasher, RN 12/30/2019, 2:05 PM

## 2020-01-03 LAB — CULTURE, BLOOD (ROUTINE X 2)
Culture: NO GROWTH
Culture: NO GROWTH
Special Requests: ADEQUATE

## 2020-04-11 ENCOUNTER — Encounter: Payer: Self-pay | Admitting: *Deleted

## 2020-04-11 NOTE — Progress Notes (Signed)

## 2020-04-12 NOTE — Progress Notes (Signed)
Things That May Be Affecting Your Health: x Alcohol - not confirmed, only on chart  Hearing loss  Pain   x Depression  Home Safety  Sexual Health   Diabetes  Lack of physical activity  Stress   Difficulty with daily activities  Loneliness  Tiredness  x Drug use - unclear if active  Medicines x Tobacco use   Falls  Motor Vehicle Safety  Weight   Food choices  Oral Health  Other    YOUR PERSONALIZED HEALTH PLAN : 1. Schedule your next subsequent Medicare Wellness visit in one year 2. Attend all of your regular appointments to address your medical issues 3. Complete the preventative screenings and services   Annual Wellness Visit   Medicare Covered Preventative Screenings and [place] Men and Women Who How Often Need? Date of Last Service Action  Abdominal Aortic Aneurysm Adults with AAA risk factors Once     Alcohol Misuse and Counseling All Adults Screening once a year if no alcohol misuse. Counseling up to 4 face to face sessions.     Bone Density Measurement  Adults at risk for osteoporosis Once every 2 yrs     Lipid Panel Z13.6 All adults without CV disease Once every 5 yrs     Colorectal Cancer   Stool sample or  Colonoscopy All adults 78 and older   Once every year  Every 10 years x    Depression All Adults Once a year x Today   Diabetes Screening Blood glucose, post glucose load, or GTT Z13.1  All adults at risk  Pre-diabetics  Once per year  Twice per year x    Diabetes  Self-Management Training All adults Diabetics 10 hrs first year; 2 hours subsequent years. Requires Copay     Glaucoma  Diabetics  Family history of glaucoma  African Americans 28 yrs +  Hispanic Americans 58 yrs + Annually - requires coppay     Hepatitis C Z72.89 or F19.20  High Risk for HCV  Born between 1945 and 1965  Annually  Once x    HIV Z11.4 All adults based on risk  Annually btw ages 65 & 59 regardless of risk  Annually > 65 yrs if at increased  risk x    Lung Cancer Screening Asymptomatic adults aged 69-77 with 30 pack yr history and current smoker OR quit within the last 15 yrs Annually Must have counseling and shared decision making documentation before first screen     Medical Nutrition Therapy Adults with   Diabetes  Renal disease  Kidney transplant within past 3 yrs 3 hours first year; 2 hours subsequent years     Obesity and Counseling All adults Screening once a year Counseling if BMI 30 or higher  Today   Tobacco Use Counseling Adults who use tobacco  Up to 8 visits in one year x    Vaccines Z23  Hepatitis B  Influenza   Pneumonia  Adults   Once  Once every flu season  Two different vaccines separated by one year x    Next Annual Wellness Visit People with Medicare Every year  Today     Services & Screenings Women Who How Often Need  Date of Last Service Action  Mammogram  Z12.31 Women over 9 One baseline ages 13-39. Annually ager 40 yrs+     Pap tests All women Annually if high risk. Every 2 yrs for normal risk women     Screening for cervical cancer  with   Pap (Z01.419 nl or Z01.411abnl) &  HPV Z11.51 Women aged 57 to 2 Once every 5 yrs     Screening pelvic and breast exams All women Annually if high risk. Every 2 yrs for normal risk women     Sexually Transmitted Diseases  Chlamydia  Gonorrhea  Syphilis All at risk adults Annually for non pregnant females at increased risk         [place] Men Who How Ofter Need  Date of Last Service Action  Prostate Cancer - DRE & PSA Men over 50 Annually.  DRE might require a copay.     Sexually Transmitted Diseases  Syphilis All at risk adults Annually for men at increased risk

## 2020-10-24 ENCOUNTER — Encounter (HOSPITAL_COMMUNITY): Payer: Self-pay

## 2020-10-24 ENCOUNTER — Emergency Department (HOSPITAL_COMMUNITY): Payer: Medicare Other

## 2020-10-24 ENCOUNTER — Emergency Department (HOSPITAL_COMMUNITY)
Admission: EM | Admit: 2020-10-24 | Discharge: 2020-10-24 | Disposition: A | Discharge status: Home / Self Care | Payer: Medicare Other | Attending: Emergency Medicine | Admitting: Emergency Medicine

## 2020-10-24 ENCOUNTER — Other Ambulatory Visit: Payer: Self-pay

## 2020-10-24 DIAGNOSIS — W2210XA Striking against or struck by unspecified automobile airbag, initial encounter: Secondary | ICD-10-CM | POA: Insufficient documentation

## 2020-10-24 DIAGNOSIS — W19XXXA Unspecified fall, initial encounter: Secondary | ICD-10-CM | POA: Diagnosis not present

## 2020-10-24 DIAGNOSIS — S52611A Displaced fracture of right ulna styloid process, initial encounter for closed fracture: Secondary | ICD-10-CM

## 2020-10-24 DIAGNOSIS — Y92410 Unspecified street and highway as the place of occurrence of the external cause: Secondary | ICD-10-CM | POA: Diagnosis not present

## 2020-10-24 DIAGNOSIS — Z8616 Personal history of COVID-19: Secondary | ICD-10-CM | POA: Insufficient documentation

## 2020-10-24 DIAGNOSIS — M7989 Other specified soft tissue disorders: Secondary | ICD-10-CM | POA: Diagnosis not present

## 2020-10-24 DIAGNOSIS — I10 Essential (primary) hypertension: Secondary | ICD-10-CM | POA: Insufficient documentation

## 2020-10-24 DIAGNOSIS — S52531A Colles' fracture of right radius, initial encounter for closed fracture: Secondary | ICD-10-CM | POA: Insufficient documentation

## 2020-10-24 DIAGNOSIS — F17210 Nicotine dependence, cigarettes, uncomplicated: Secondary | ICD-10-CM | POA: Insufficient documentation

## 2020-10-24 DIAGNOSIS — R0902 Hypoxemia: Secondary | ICD-10-CM | POA: Diagnosis not present

## 2020-10-24 DIAGNOSIS — S4991XA Unspecified injury of right shoulder and upper arm, initial encounter: Secondary | ICD-10-CM | POA: Diagnosis present

## 2020-10-24 DIAGNOSIS — Y9301 Activity, walking, marching and hiking: Secondary | ICD-10-CM | POA: Diagnosis not present

## 2020-10-24 DIAGNOSIS — S52571A Other intraarticular fracture of lower end of right radius, initial encounter for closed fracture: Secondary | ICD-10-CM | POA: Diagnosis not present

## 2020-10-24 DIAGNOSIS — R609 Edema, unspecified: Secondary | ICD-10-CM | POA: Diagnosis not present

## 2020-10-24 DIAGNOSIS — Z79899 Other long term (current) drug therapy: Secondary | ICD-10-CM | POA: Insufficient documentation

## 2020-10-24 MED ORDER — OXYCODONE-ACETAMINOPHEN 5-325 MG PO TABS: 1.0000 | ORAL_TABLET | Freq: Four times a day (QID) | ORAL | 0 refills | Status: DC | PRN

## 2020-10-24 MED ORDER — LIDOCAINE HCL 2 % IJ SOLN
20.0000 mL | Freq: Once | INTRAMUSCULAR | Status: AC
  Administered 2020-10-24: 19:00:00 400 mg
  Filled 2020-10-24: qty 20

## 2020-10-24 MED ORDER — FENTANYL CITRATE (PF) 100 MCG/2ML IJ SOLN
50.0000 ug | Freq: Once | INTRAMUSCULAR | Status: AC
  Administered 2020-10-24: 19:00:00 50 ug via INTRAVENOUS
  Filled 2020-10-24: qty 2

## 2020-10-24 MED ORDER — FENTANYL CITRATE (PF) 100 MCG/2ML IJ SOLN
50.0000 ug | Freq: Once | INTRAMUSCULAR | Status: AC
  Administered 2020-10-24: 18:00:00 50 ug via INTRAVENOUS
  Filled 2020-10-24: qty 2

## 2020-10-24 NOTE — ED Notes (Signed)
Lidocaine bedside

## 2020-10-24 NOTE — Progress Notes (Signed)
Orthopedic Tech Progress Note Patient Details:  Andre [name] 04/05/1951 [id number]  Ortho Devices Type of Ortho Device: Arm sling Ortho Device/Splint Interventions: Application   Post Interventions Patient Tolerated: Well Instructions Provided: Care of device  [name] 10/24/2020, 8:37 PM

## 2020-10-24 NOTE — Discharge Instructions (Addendum)
It is very important that you schedule appointment with the hand doctor.  You will need surgery to repair this break in your wrist.  Call him first thing in the morning to make that appointment. Keep the splint clean, dry, and in place at all times.  Keep your arm elevated. You can take the oxycodone every 6 hours for pain. DO NOT DRINK ALCOHOL WITH THIS MEDICATION. Do not use any drugs with this medication.
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
## 2020-10-26 DIAGNOSIS — M25531 Pain in right wrist: Secondary | ICD-10-CM | POA: Diagnosis not present

## 2020-10-26 DIAGNOSIS — S52532A Colles' fracture of left radius, initial encounter for closed fracture: Secondary | ICD-10-CM | POA: Diagnosis not present

## 2020-10-29 ENCOUNTER — Emergency Department (HOSPITAL_COMMUNITY): Payer: Medicare Other

## 2020-10-29 ENCOUNTER — Encounter (HOSPITAL_COMMUNITY): Payer: Self-pay | Admitting: Emergency Medicine

## 2020-10-29 ENCOUNTER — Emergency Department (HOSPITAL_COMMUNITY)
Admission: EM | Admit: 2020-10-29 | Discharge: 2020-10-29 | Disposition: A | Discharge status: Home / Self Care | Payer: Medicare Other | Attending: Emergency Medicine | Admitting: Emergency Medicine

## 2020-10-29 DIAGNOSIS — Z9049 Acquired absence of other specified parts of digestive tract: Secondary | ICD-10-CM | POA: Diagnosis not present

## 2020-10-29 DIAGNOSIS — S0990XA Unspecified injury of head, initial encounter: Secondary | ICD-10-CM | POA: Diagnosis not present

## 2020-10-29 DIAGNOSIS — M47814 Spondylosis without myelopathy or radiculopathy, thoracic region: Secondary | ICD-10-CM | POA: Diagnosis not present

## 2020-10-29 DIAGNOSIS — W19XXXA Unspecified fall, initial encounter: Secondary | ICD-10-CM

## 2020-10-29 DIAGNOSIS — S2242XA Multiple fractures of ribs, left side, initial encounter for closed fracture: Secondary | ICD-10-CM | POA: Diagnosis not present

## 2020-10-29 DIAGNOSIS — S2241XA Multiple fractures of ribs, right side, initial encounter for closed fracture: Secondary | ICD-10-CM | POA: Diagnosis not present

## 2020-10-29 DIAGNOSIS — W01198A Fall on same level from slipping, tripping and stumbling with subsequent striking against other object, initial encounter: Secondary | ICD-10-CM | POA: Diagnosis not present

## 2020-10-29 DIAGNOSIS — S01111A Laceration without foreign body of right eyelid and periocular area, initial encounter: Secondary | ICD-10-CM | POA: Diagnosis not present

## 2020-10-29 DIAGNOSIS — I10 Essential (primary) hypertension: Secondary | ICD-10-CM | POA: Insufficient documentation

## 2020-10-29 DIAGNOSIS — Z8616 Personal history of COVID-19: Secondary | ICD-10-CM | POA: Diagnosis not present

## 2020-10-29 DIAGNOSIS — Z743 Need for continuous supervision: Secondary | ICD-10-CM | POA: Diagnosis not present

## 2020-10-29 DIAGNOSIS — F17210 Nicotine dependence, cigarettes, uncomplicated: Secondary | ICD-10-CM | POA: Diagnosis not present

## 2020-10-29 DIAGNOSIS — R0902 Hypoxemia: Secondary | ICD-10-CM | POA: Diagnosis not present

## 2020-10-29 DIAGNOSIS — Z79899 Other long term (current) drug therapy: Secondary | ICD-10-CM | POA: Insufficient documentation

## 2020-10-29 DIAGNOSIS — I639 Cerebral infarction, unspecified: Secondary | ICD-10-CM | POA: Diagnosis not present

## 2020-10-29 DIAGNOSIS — G319 Degenerative disease of nervous system, unspecified: Secondary | ICD-10-CM | POA: Diagnosis not present

## 2020-10-29 DIAGNOSIS — S299XXA Unspecified injury of thorax, initial encounter: Secondary | ICD-10-CM | POA: Diagnosis present

## 2020-10-29 DIAGNOSIS — R0781 Pleurodynia: Secondary | ICD-10-CM | POA: Diagnosis not present

## 2020-10-29 DIAGNOSIS — S2249XA Multiple fractures of ribs, unspecified side, initial encounter for closed fracture: Secondary | ICD-10-CM

## 2020-10-29 DIAGNOSIS — R58 Hemorrhage, not elsewhere classified: Secondary | ICD-10-CM | POA: Diagnosis not present

## 2020-10-29 MED ORDER — OXYCODONE-ACETAMINOPHEN 5-325 MG PO TABS: 1.0000 | ORAL_TABLET | Freq: Four times a day (QID) | ORAL | 0 refills | Status: DC | PRN

## 2020-10-29 MED ORDER — OXYCODONE-ACETAMINOPHEN 5-325 MG PO TABS
1.0000 | ORAL_TABLET | Freq: Once | ORAL | Status: AC
  Administered 2020-10-29: 15:00:00 1 via ORAL
  Filled 2020-10-29: qty 1

## 2020-10-29 NOTE — Discharge Instructions (Addendum)
You have been seen and discharged from the emergency department.  Your head CT is normal.  Your rib x-rays showed 2 fractures, without lung injury.  Take pain medicine as needed.  Do not mix this medication with alcohol or other sedating medications. Do not drive or do heavy physical activity and to know how this medication affects you.  It may cause drowsiness.  Use spirometer as directed.  Rest and avoid any heavy activity/lifting/twisting.  Follow-up with your primary provider for reevaluation and further care. Take home medications as prescribed. If you have any worsening symptoms or further concerns for your health please return to an emergency department for further evaluation.
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
## 2020-10-30 ENCOUNTER — Encounter (HOSPITAL_COMMUNITY): Payer: Self-pay | Admitting: Orthopedic Surgery

## 2020-10-30 ENCOUNTER — Other Ambulatory Visit: Payer: Self-pay

## 2020-10-30 DIAGNOSIS — S52501A Unspecified fracture of the lower end of right radius, initial encounter for closed fracture: Secondary | ICD-10-CM
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
## 2020-10-30 NOTE — Anesthesia Preprocedure Evaluation (Addendum)
Anesthesia Evaluation  Patient identified by MRN, date of birth, ID band Patient awake    Reviewed: Allergy & Precautions, NPO status , Patient's Chart, lab work & pertinent test results  Airway Mallampati: III  TM Distance: >3 FB Neck ROM: Full    Dental  (+) Edentulous Upper, Missing   Pulmonary Current Smoker and Patient abstained from smoking.,    Pulmonary exam normal breath sounds clear to auscultation       Cardiovascular hypertension, Normal cardiovascular exam Rhythm:Regular Rate:Normal  ECG: ST, rate 109   Neuro/Psych PSYCHIATRIC DISORDERS CVA    GI/Hepatic negative GI ROS, (+)     substance abuse  ,   Endo/Other  negative endocrine ROS  Renal/GU negative Renal ROS     Musculoskeletal Ambulates with cane Motor and sensation intact to right hand   Abdominal   Peds  Hematology HLD   Anesthesia Other Findings Right wrist intra-articular distal radius fracture  Reproductive/Obstetrics                            Anesthesia Physical Anesthesia Plan  ASA: 3  Anesthesia Plan: Regional   Post-op Pain Management:    Induction: Intravenous  PONV Risk Score and Plan: 0 and Ondansetron, Dexamethasone, Propofol infusion, Midazolam and Treatment may vary due to age or medical condition  Airway Management Planned: Simple Face Mask  Additional Equipment:   Intra-op Plan:   Post-operative Plan:   Informed Consent: I have reviewed the patients History and Physical, chart, labs and discussed the procedure including the risks, benefits and alternatives for the proposed anesthesia with the patient or authorized representative who has indicated his/her understanding and acceptance.     Dental advisory given  Plan Discussed with: CRNA  Anesthesia Plan Comments: (Reviewed PAT note written 10/30/2020 by Myra Gianotti, PA-C. )       Anesthesia Quick Evaluation
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
## 2020-10-30 NOTE — Progress Notes (Signed)
PCP - [facility] --"it's been awhile" Cardiologist - denies EKG - 12-29-19 Chest x-ray - 12-29-19 ECHO - 05-04-2017 Cardiac Cath - denies CPAP - denies  Blood Thinner Instructions: No longer on Plavix, reports stopping 1-2 years ago.  Hx stroke Aspirin Instructions: n/a  ERAS Protcol - clears until 3pm  COVID TEST- n/a--ambulatory surgry  Anesthesia review: Made aware  -------------  SDW INSTRUCTIONS:  Your procedure is scheduled on June 29. Please report to [facility] Entrance "A" at 4:10pm, and check in at the Admitting office. Call this number if you have problems the morning of surgery: [phone]   Remember: Do not eat after midnight the night before your surgery  You may drink clear liquids until 3:00pm the afternoonof your surgery.   Clear liquids allowed are: Water, Non-Citrus Juices (without pulp), Carbonated Beverages, Clear Tea, Black Coffee Only, and Gatorade   Medications to take morning of surgery with a sip of water include: Oxycodone if needed  As of today, STOP taking any Aspirin (unless otherwise instructed by your surgeon), Aleve, Naproxen, Ibuprofen, Motrin, Advil, Goody's, BC's, all herbal medications, fish oil, and all vitamins.    The Morning of Surgery Do not wear jewelry Do not wear lotions, powders, or perfumes/colognes, or deodorant Do not shave 48 hours prior to surgery.   Men may shave face and neck. Do not bring valuables to the hospital. [facility] is not responsible for any belongings or valuables.  If you are a smoker, DO NOT Smoke 24 hours prior to surgery If you wear a CPAP at night please bring your mask the morning of surgery  Remember that you must have someone to transport you home after your surgery, and remain with you for 24 hours if you are discharged the same day.  Please bring cases for contacts, glasses, hearing aids, dentures or bridgework because it cannot be worn into surgery.   Patients discharged  the day of surgery will not be allowed to drive home.   Please shower the NIGHT BEFORE/MORNING OF SURGERY (use antibacterial soap like DIAL soap if possible). Wear comfortable clothes the morning of surgery. Oral Hygiene is also important to reduce your risk of infection.  Remember - BRUSH YOUR TEETH THE MORNING OF SURGERY WITH YOUR REGULAR TOOTHPASTE  Patient denies shortness of breath, fever, cough and chest pain.

## 2020-10-31 ENCOUNTER — Ambulatory Visit (HOSPITAL_COMMUNITY): Payer: Medicare Other | Admitting: Vascular Surgery

## 2020-10-31 ENCOUNTER — Encounter (HOSPITAL_COMMUNITY)
Admission: RE | Disposition: A | Discharge status: Home / Self Care | Payer: Self-pay | Source: Home / Self Care | Attending: Orthopedic Surgery

## 2020-10-31 ENCOUNTER — Encounter (HOSPITAL_COMMUNITY): Payer: Self-pay | Admitting: Orthopedic Surgery

## 2020-10-31 ENCOUNTER — Ambulatory Visit (HOSPITAL_COMMUNITY)
Admission: RE | Admit: 2020-10-31 | Discharge: 2020-10-31 | Disposition: A | Discharge status: Home / Self Care | Payer: Medicare Other | Attending: Orthopedic Surgery | Admitting: Orthopedic Surgery

## 2020-10-31 ENCOUNTER — Other Ambulatory Visit: Payer: Self-pay

## 2020-10-31 DIAGNOSIS — S52501D Unspecified fracture of the lower end of right radius, subsequent encounter for closed fracture with routine healing: Secondary | ICD-10-CM

## 2020-10-31 DIAGNOSIS — S52501A Unspecified fracture of the lower end of right radius, initial encounter for closed fracture: Secondary | ICD-10-CM

## 2020-10-31 DIAGNOSIS — E785 Hyperlipidemia, unspecified: Secondary | ICD-10-CM | POA: Diagnosis not present

## 2020-10-31 DIAGNOSIS — F17210 Nicotine dependence, cigarettes, uncomplicated: Secondary | ICD-10-CM | POA: Diagnosis not present

## 2020-10-31 DIAGNOSIS — S52571A Other intraarticular fracture of lower end of right radius, initial encounter for closed fracture: Secondary | ICD-10-CM | POA: Diagnosis not present

## 2020-10-31 DIAGNOSIS — S2241XA Multiple fractures of ribs, right side, initial encounter for closed fracture: Secondary | ICD-10-CM | POA: Insufficient documentation

## 2020-10-31 DIAGNOSIS — I10 Essential (primary) hypertension: Secondary | ICD-10-CM | POA: Diagnosis not present

## 2020-10-31 DIAGNOSIS — S52531A Colles' fracture of right radius, initial encounter for closed fracture: Secondary | ICD-10-CM | POA: Diagnosis not present

## 2020-10-31 DIAGNOSIS — S52611A Displaced fracture of right ulna styloid process, initial encounter for closed fracture: Secondary | ICD-10-CM | POA: Insufficient documentation

## 2020-10-31 DIAGNOSIS — I69351 Hemiplegia and hemiparesis following cerebral infarction affecting right dominant side: Secondary | ICD-10-CM | POA: Insufficient documentation

## 2020-10-31 DIAGNOSIS — M67833 Other specified disorders of tendon, right wrist: Secondary | ICD-10-CM | POA: Diagnosis not present

## 2020-10-31 HISTORY — PX: ORIF WRIST FRACTURE: SHX2133

## 2020-10-31 LAB — CBC
HCT: 45.8 % (ref 39.0–52.0)
Hemoglobin: 14.5 g/dL (ref 13.0–17.0)
MCH: 30 pg (ref 26.0–34.0)
MCHC: 31.7 g/dL (ref 30.0–36.0)
MCV: 94.6 fL (ref 80.0–100.0)
Platelets: 199 10*3/uL (ref 150–400)
RBC: 4.84 MIL/uL (ref 4.22–5.81)
RDW: 12.9 % (ref 11.5–15.5)
WBC: 10.4 10*3/uL (ref 4.0–10.5)
nRBC: 0 % (ref 0.0–0.2)

## 2020-10-31 LAB — COMPREHENSIVE METABOLIC PANEL
ALT: 14 U/L (ref 0–44)
AST: 33 U/L (ref 15–41)
Albumin: 3.5 g/dL (ref 3.5–5.0)
Alkaline Phosphatase: 54 U/L (ref 38–126)
Anion gap: 12 (ref 5–15)
BUN: 16 mg/dL (ref 8–23)
CO2: 24 mmol/L (ref 22–32)
Calcium: 9.9 mg/dL (ref 8.9–10.3)
Chloride: 102 mmol/L (ref 98–111)
Creatinine, Ser: 1.37 mg/dL — ABNORMAL HIGH (ref 0.61–1.24)
GFR, Estimated: 56 mL/min — ABNORMAL LOW (ref >60)
Glucose, Bld: 78 mg/dL (ref 70–99)
Potassium: 4.7 mmol/L (ref 3.5–5.1)
Sodium: 138 mmol/L (ref 135–145)
Total Bilirubin: 2.2 mg/dL — ABNORMAL HIGH (ref 0.3–1.2)
Total Protein: 7.1 g/dL (ref 6.5–8.1)

## 2020-10-31 SURGERY — OPEN REDUCTION INTERNAL FIXATION (ORIF) WRIST FRACTURE
Anesthesia: Regional | Site: Wrist | Laterality: Right

## 2020-10-31 MED ORDER — MIDAZOLAM HCL 2 MG/2ML IJ SOLN: 2.0000 mg | Freq: Once | INTRAMUSCULAR | Status: AC

## 2020-10-31 MED ORDER — MIDAZOLAM HCL 2 MG/2ML IJ SOLN
INTRAMUSCULAR | Status: AC
  Filled 2020-10-31: qty 2

## 2020-10-31 MED ORDER — CEFAZOLIN SODIUM-DEXTROSE 2-4 GM/100ML-% IV SOLN
INTRAVENOUS | Status: AC
  Filled 2020-10-31: qty 100

## 2020-10-31 MED ORDER — OXYCODONE-ACETAMINOPHEN 10-325 MG PO TABS: 1.0000 | ORAL_TABLET | Freq: Four times a day (QID) | ORAL | 0 refills | Status: AC | PRN

## 2020-10-31 MED ORDER — LACTATED RINGERS IV SOLN: INTRAVENOUS | Status: DC

## 2020-10-31 MED ORDER — FENTANYL CITRATE (PF) 100 MCG/2ML IJ SOLN
INTRAMUSCULAR | Status: AC
  Administered 2020-10-31: 50 ug via INTRAVENOUS
  Filled 2020-10-31: qty 2

## 2020-10-31 MED ORDER — LACTATED RINGERS IV SOLN: INTRAVENOUS | Status: DC | PRN

## 2020-10-31 MED ORDER — ORAL CARE MOUTH RINSE: 15.0000 mL | Freq: Once | OROMUCOSAL | Status: AC

## 2020-10-31 MED ORDER — FENTANYL CITRATE (PF) 100 MCG/2ML IJ SOLN
INTRAMUSCULAR | Status: DC | PRN
  Administered 2020-10-31: 50 ug via INTRAVENOUS

## 2020-10-31 MED ORDER — MIDAZOLAM HCL 2 MG/2ML IJ SOLN
INTRAMUSCULAR | Status: AC
  Administered 2020-10-31: 2 mg via INTRAVENOUS
  Filled 2020-10-31: qty 2

## 2020-10-31 MED ORDER — CEFAZOLIN SODIUM-DEXTROSE 2-4 GM/100ML-% IV SOLN
2.0000 g | INTRAVENOUS | Status: AC
  Administered 2020-10-31: 2 g via INTRAVENOUS

## 2020-10-31 MED ORDER — FENTANYL CITRATE (PF) 100 MCG/2ML IJ SOLN: 50.0000 ug | Freq: Once | INTRAMUSCULAR | Status: AC

## 2020-10-31 MED ORDER — 0.9 % SODIUM CHLORIDE (POUR BTL) OPTIME
TOPICAL | Status: DC | PRN
  Administered 2020-10-31: 1000 mL

## 2020-10-31 MED ORDER — PROPOFOL 10 MG/ML IV BOLUS
INTRAVENOUS | Status: DC | PRN
  Administered 2020-10-31: 20 ug/kg/min via INTRAVENOUS

## 2020-10-31 MED ORDER — FENTANYL CITRATE (PF) 250 MCG/5ML IJ SOLN
INTRAMUSCULAR | Status: AC
  Filled 2020-10-31: qty 5

## 2020-10-31 MED ORDER — CHLORHEXIDINE GLUCONATE 0.12 % MT SOLN
OROMUCOSAL | Status: AC
  Administered 2020-10-31: 15 mL via OROMUCOSAL
  Filled 2020-10-31: qty 15

## 2020-10-31 MED ORDER — PROPOFOL 10 MG/ML IV BOLUS
INTRAVENOUS | Status: AC
  Filled 2020-10-31: qty 20

## 2020-10-31 MED ORDER — MIDAZOLAM HCL 5 MG/5ML IJ SOLN
INTRAMUSCULAR | Status: DC | PRN
  Administered 2020-10-31: 1 mg via INTRAVENOUS

## 2020-10-31 MED ORDER — CHLORHEXIDINE GLUCONATE 0.12 % MT SOLN: 15.0000 mL | Freq: Once | OROMUCOSAL | Status: AC

## 2020-10-31 SURGICAL SUPPLY — 68 items
BAG COUNTER SPONGE SURGICOUNT (BAG) IMPLANT
BAG SURGICOUNT SPONGE COUNTING (BAG)
BIT DRILL 2.2 SS TIBIAL (BIT) ×3 IMPLANT
BLADE CLIPPER SURG (BLADE) IMPLANT
BNDG ELASTIC 3X5.8 VLCR STR LF (GAUZE/BANDAGES/DRESSINGS) ×3 IMPLANT
BNDG ELASTIC 4X5.8 VLCR STR LF (GAUZE/BANDAGES/DRESSINGS) ×3 IMPLANT
BNDG ESMARK 4X9 LF (GAUZE/BANDAGES/DRESSINGS) ×3 IMPLANT
BNDG GAUZE ELAST 4 BULKY (GAUZE/BANDAGES/DRESSINGS) ×3 IMPLANT
CORD BIPOLAR FORCEPS 12FT (ELECTRODE) ×3 IMPLANT
COVER SURGICAL LIGHT HANDLE (MISCELLANEOUS) ×3 IMPLANT
CUFF TOURN SGL QUICK 18X4 (TOURNIQUET CUFF) ×3 IMPLANT
CUFF TOURN SGL QUICK 24 (TOURNIQUET CUFF)
CUFF TRNQT CYL 24X4X16.5-23 (TOURNIQUET CUFF) IMPLANT
DRAIN TLS ROUND 10FR (DRAIN) IMPLANT
DRAPE OEC MINIVIEW 54X84 (DRAPES) ×3 IMPLANT
DRAPE SURG 17X11 SM STRL (DRAPES) ×3 IMPLANT
DRIVER BIT SQUARE 1.7/2.2 (TRAUMA) ×3 IMPLANT
DRSG ADAPTIC 3X8 NADH LF (GAUZE/BANDAGES/DRESSINGS) ×3 IMPLANT
ELECT REM PT RETURN 9FT ADLT (ELECTROSURGICAL)
ELECTRODE REM PT RTRN 9FT ADLT (ELECTROSURGICAL) IMPLANT
GAUZE SPONGE 4X4 12PLY STRL (GAUZE/BANDAGES/DRESSINGS) IMPLANT
GAUZE SPONGE 4X4 12PLY STRL LF (GAUZE/BANDAGES/DRESSINGS) ×3 IMPLANT
GLOVE SRG 8 PF TXTR STRL LF DI (GLOVE) ×1 IMPLANT
GLOVE SURG ORTHO LTX SZ8 (GLOVE) ×3 IMPLANT
GLOVE SURG UNDER POLY LF SZ8 (GLOVE) ×3
GLOVE SURG UNDER POLY LF SZ8.5 (GLOVE) ×3 IMPLANT
GOWN STRL REUS W/ TWL LRG LVL3 (GOWN DISPOSABLE) ×3 IMPLANT
GOWN STRL REUS W/ TWL XL LVL3 (GOWN DISPOSABLE) ×1 IMPLANT
GOWN STRL REUS W/TWL LRG LVL3 (GOWN DISPOSABLE) ×9
GOWN STRL REUS W/TWL XL LVL3 (GOWN DISPOSABLE) ×3
K-WIRE 1.6 (WIRE) ×6
K-WIRE FX150X1.6XTROC TIP (WIRE) ×1
K-WIRE FX5X1.6XNS BN SS (WIRE) ×1
KIT BASIN OR (CUSTOM PROCEDURE TRAY) ×3 IMPLANT
KIT TURNOVER KIT B (KITS) ×3 IMPLANT
KWIRE FX 150X1.6 TROC TIP (WIRE) ×1 IMPLANT
KWIRE FX5X1.6XNS BN SS (WIRE) ×1 IMPLANT
MANIFOLD NEPTUNE II (INSTRUMENTS) ×3 IMPLANT
NEEDLE HYPO 25X1 1.5 SAFETY (NEEDLE) IMPLANT
NS IRRIG 1000ML POUR BTL (IV SOLUTION) ×3 IMPLANT
PACK ORTHO EXTREMITY (CUSTOM PROCEDURE TRAY) ×3 IMPLANT
PAD ARMBOARD 7.5X6 YLW CONV (MISCELLANEOUS) ×6 IMPLANT
PAD CAST 4YDX4 CTTN HI CHSV (CAST SUPPLIES) IMPLANT
PADDING CAST COTTON 4X4 STRL (CAST SUPPLIES)
PEG LOCKING SMOOTH 2.2X18 (Peg) ×3 IMPLANT
PEG LOCKING SMOOTH 2.2X20 (Screw) ×6 IMPLANT
PEG LOCKING SMOOTH 2.2X22 (Screw) ×3 IMPLANT
PEG LOCKING SMOOTH 2.2X24 (Peg) ×6 IMPLANT
PEG LOCKING SMOOTH 2.2X26 (Peg) ×3 IMPLANT
PLATE DVR CROSSLOCK STD RT (Plate) ×3 IMPLANT
SCREW LOCK 16X2.7X 3 LD TPR (Screw) ×2 IMPLANT
SCREW LOCK 18X2.7X 3 LD TPR (Screw) ×3 IMPLANT
SCREW LOCKING 2.7X16 (Screw) ×6 IMPLANT
SCREW LOCKING 2.7X18 (Screw) ×9 IMPLANT
SLING ARM IMMOBILIZER LRG (SOFTGOODS) ×3 IMPLANT
SOAP 2 % CHG 4 OZ (WOUND CARE) ×3 IMPLANT
SUT MNCRL AB 3-0 PS2 27 (SUTURE) ×3 IMPLANT
SUT PROLENE 3 0 PS 1 (SUTURE) ×3 IMPLANT
SUT PROLENE 4 0 PS 2 18 (SUTURE) IMPLANT
SUT VIC AB 2-0 FS1 27 (SUTURE) ×3 IMPLANT
SUT VICRYL 4-0 PS2 18IN ABS (SUTURE) IMPLANT
SYR CONTROL 10ML LL (SYRINGE) IMPLANT
SYSTEM CHEST DRAIN TLS 7FR (DRAIN) IMPLANT
TOWEL GREEN STERILE (TOWEL DISPOSABLE) ×3 IMPLANT
TOWEL GREEN STERILE FF (TOWEL DISPOSABLE) IMPLANT
TUBE CONNECTING 12'X1/4 (SUCTIONS) ×1
TUBE CONNECTING 12X1/4 (SUCTIONS) ×2 IMPLANT
WATER STERILE IRR 1000ML POUR (IV SOLUTION) IMPLANT

## 2020-10-31 NOTE — Transfer of Care (Signed)
Immediate Anesthesia Transfer of Care Note  Patient: Andre [name]  Procedure(s) Performed: OPEN REDUCTION INTERNAL FIXATION (ORIF) WRIST FRACTURE (Right: Wrist)  Patient Location: PACU  Anesthesia Type:General  Level of Consciousness: awake, alert , oriented and patient cooperative  Airway & Oxygen Therapy: Patient Spontanous Breathing and Patient connected to nasal cannula oxygen  Post-op Assessment: Report given to RN and Post -op Vital signs reviewed and stable  Post vital signs: Reviewed and stable  Last Vitals:  Vitals Value Taken Time  BP 156/101 10/31/20 2045  Temp    Pulse 87 10/31/20 2048  Resp 23 10/31/20 2048  SpO2 100 % 10/31/20 2048  Vitals shown include unvalidated device data.  Last Pain:  Vitals:   10/31/20 1612  TempSrc: Oral         Complications: No notable events documented.

## 2020-10-31 NOTE — Discharge Instructions (Signed)
KEEP BANDAGE CLEAN AND DRY °CALL OFFICE FOR F/U APPT [phone] °KEEP HAND ELEVATED ABOVE HEART °OK TO APPLY ICE TO OPERATIVE AREA °CONTACT OFFICE IF ANY WORSENING PAIN OR CONCERNS. °

## 2020-10-31 NOTE — Anesthesia Procedure Notes (Addendum)
Anesthesia Regional Block: Axillary brachial plexus block   Pre-Anesthetic Checklist: , timeout performed,  Correct Patient, Correct Site, Correct Laterality,  Correct Procedure, Correct Position, site marked,  Risks and benefits discussed,  Surgical consent,  Pre-op evaluation,  At surgeon's request and post-op pain management  Laterality: Right and Upper  Prep: chloraprep       Needles:  Injection technique: Single-shot      Needle Length: 9cm  Needle Gauge: 22     Additional Needles: Arrow StimuQuik ECHO Echogenic Stimulating PNB Needle  Procedures:,,,, ultrasound used (permanent image in chart),,    Narrative:  Start time: 10/31/2020 7:11 PM End time: 10/31/2020 7:17 PM Injection made incrementally with aspirations every 5 mL.  Performed by: Personally  Anesthesiologist: Oleta Mouse, MD

## 2020-10-31 NOTE — Op Note (Signed)
P 

## 2020-10-31 NOTE — Anesthesia Procedure Notes (Signed)
Procedure Name: MAC Date/Time: 10/31/2020 7:51 PM Performed by: Oletta Lamas, CRNA Pre-anesthesia Checklist: Suction available, Patient being monitored, Timeout performed, Patient identified and Emergency Drugs available Patient Re-evaluated:Patient Re-evaluated prior to induction Oxygen Delivery Method: Nasal cannula
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
## 2020-11-01 ENCOUNTER — Encounter (HOSPITAL_COMMUNITY): Payer: Self-pay | Admitting: Orthopedic Surgery

## 2020-11-07 NOTE — Anesthesia Postprocedure Evaluation (Signed)
Anesthesia Post Note  Patient: Andre [name]  Procedure(s) Performed: 1.Open treatment of right wrist intra-articular distal radius fracture, 3 or more fragments. 2. Right wrist brachioradialis tenotomy and release. 3. Radiographs, 3 view Right wrist. (Right: Wrist)     Anesthesia Post Evaluation No notable events documented.  Last Vitals:  Vitals:   10/31/20 2100 10/31/20 2115  BP: (!) 166/88 (!) 170/90  Pulse: 81 78  Resp: 17 18  Temp:    SpO2: 94% 94%    Last Pain:  Vitals:   10/31/20 2115  TempSrc:   PainSc: 0-No pain                 Andriy Sherk

## 2020-11-22 DIAGNOSIS — S52532A Colles' fracture of left radius, initial encounter for closed fracture: Secondary | ICD-10-CM | POA: Diagnosis not present

## 2020-12-06 DIAGNOSIS — M25631 Stiffness of right wrist, not elsewhere classified: Secondary | ICD-10-CM | POA: Diagnosis not present

## 2020-12-06 DIAGNOSIS — S52531D Colles' fracture of right radius, subsequent encounter for closed fracture with routine healing: Secondary | ICD-10-CM | POA: Diagnosis not present

## 2020-12-20 DIAGNOSIS — M25631 Stiffness of right wrist, not elsewhere classified: Secondary | ICD-10-CM | POA: Diagnosis not present

## 2021-01-14 ENCOUNTER — Encounter: Payer: Medicaid Other | Admitting: Internal Medicine

## 2021-02-17 DIAGNOSIS — W19XXXA Unspecified fall, initial encounter: Secondary | ICD-10-CM | POA: Diagnosis not present

## 2021-02-17 DIAGNOSIS — R55 Syncope and collapse: Secondary | ICD-10-CM | POA: Diagnosis not present

## 2021-02-26 DIAGNOSIS — S52501A Unspecified fracture of the lower end of right radius, initial encounter for closed fracture: Secondary | ICD-10-CM | POA: Diagnosis not present

## 2021-03-15 ENCOUNTER — Emergency Department (HOSPITAL_COMMUNITY): Payer: Medicare Other

## 2021-03-15 ENCOUNTER — Inpatient Hospital Stay (HOSPITAL_COMMUNITY)
Admission: EM | Admit: 2021-03-15 | Discharge: 2021-03-20 | DRG: 023 | Disposition: A | Discharge status: Skilled Nursing Facility | Payer: Medicare Other | Attending: Neurology | Admitting: Neurology

## 2021-03-15 DIAGNOSIS — I10 Essential (primary) hypertension: Secondary | ICD-10-CM | POA: Diagnosis present

## 2021-03-15 DIAGNOSIS — R29810 Facial weakness: Secondary | ICD-10-CM | POA: Diagnosis present

## 2021-03-15 DIAGNOSIS — R471 Dysarthria and anarthria: Secondary | ICD-10-CM | POA: Diagnosis not present

## 2021-03-15 DIAGNOSIS — H518 Other specified disorders of binocular movement: Secondary | ICD-10-CM | POA: Diagnosis not present

## 2021-03-15 DIAGNOSIS — I639 Cerebral infarction, unspecified: Secondary | ICD-10-CM

## 2021-03-15 DIAGNOSIS — G8191 Hemiplegia, unspecified affecting right dominant side: Secondary | ICD-10-CM | POA: Diagnosis not present

## 2021-03-15 DIAGNOSIS — S199XXA Unspecified injury of neck, initial encounter: Secondary | ICD-10-CM | POA: Diagnosis not present

## 2021-03-15 DIAGNOSIS — Z20822 Contact with and (suspected) exposure to covid-19: Secondary | ICD-10-CM | POA: Diagnosis present

## 2021-03-15 DIAGNOSIS — Y900 Blood alcohol level of less than 20 mg/100 ml: Secondary | ICD-10-CM | POA: Diagnosis present

## 2021-03-15 DIAGNOSIS — I6602 Occlusion and stenosis of left middle cerebral artery: Secondary | ICD-10-CM | POA: Diagnosis not present

## 2021-03-15 DIAGNOSIS — J9601 Acute respiratory failure with hypoxia: Secondary | ICD-10-CM | POA: Diagnosis present

## 2021-03-15 DIAGNOSIS — I651 Occlusion and stenosis of basilar artery: Secondary | ICD-10-CM | POA: Diagnosis not present

## 2021-03-15 DIAGNOSIS — I160 Hypertensive urgency: Secondary | ICD-10-CM | POA: Diagnosis not present

## 2021-03-15 DIAGNOSIS — I63541 Cerebral infarction due to unspecified occlusion or stenosis of right cerebellar artery: Secondary | ICD-10-CM | POA: Diagnosis not present

## 2021-03-15 DIAGNOSIS — Z8616 Personal history of COVID-19: Secondary | ICD-10-CM

## 2021-03-15 DIAGNOSIS — F17210 Nicotine dependence, cigarettes, uncomplicated: Secondary | ICD-10-CM | POA: Diagnosis not present

## 2021-03-15 DIAGNOSIS — I63233 Cerebral infarction due to unspecified occlusion or stenosis of bilateral carotid arteries: Secondary | ICD-10-CM | POA: Diagnosis not present

## 2021-03-15 DIAGNOSIS — F17200 Nicotine dependence, unspecified, uncomplicated: Secondary | ICD-10-CM | POA: Diagnosis not present

## 2021-03-15 DIAGNOSIS — I63331 Cerebral infarction due to thrombosis of right posterior cerebral artery: Secondary | ICD-10-CM | POA: Diagnosis not present

## 2021-03-15 DIAGNOSIS — I672 Cerebral atherosclerosis: Secondary | ICD-10-CM | POA: Diagnosis not present

## 2021-03-15 DIAGNOSIS — Z7902 Long term (current) use of antithrombotics/antiplatelets: Secondary | ICD-10-CM

## 2021-03-15 DIAGNOSIS — E876 Hypokalemia: Secondary | ICD-10-CM | POA: Diagnosis not present

## 2021-03-15 DIAGNOSIS — R29709 NIHSS score 9: Secondary | ICD-10-CM | POA: Diagnosis not present

## 2021-03-15 DIAGNOSIS — E785 Hyperlipidemia, unspecified: Secondary | ICD-10-CM | POA: Diagnosis not present

## 2021-03-15 DIAGNOSIS — Z8249 Family history of ischemic heart disease and other diseases of the circulatory system: Secondary | ICD-10-CM

## 2021-03-15 DIAGNOSIS — S2243XA Multiple fractures of ribs, bilateral, initial encounter for closed fracture: Secondary | ICD-10-CM | POA: Diagnosis not present

## 2021-03-15 DIAGNOSIS — Z743 Need for continuous supervision: Secondary | ICD-10-CM | POA: Diagnosis not present

## 2021-03-15 DIAGNOSIS — Z9114 Patient's other noncompliance with medication regimen: Secondary | ICD-10-CM | POA: Diagnosis not present

## 2021-03-15 DIAGNOSIS — E872 Acidosis, unspecified: Secondary | ICD-10-CM | POA: Diagnosis not present

## 2021-03-15 DIAGNOSIS — Z806 Family history of leukemia: Secondary | ICD-10-CM | POA: Diagnosis not present

## 2021-03-15 DIAGNOSIS — F1010 Alcohol abuse, uncomplicated: Secondary | ICD-10-CM | POA: Diagnosis present

## 2021-03-15 DIAGNOSIS — Z01818 Encounter for other preprocedural examination: Secondary | ICD-10-CM

## 2021-03-15 DIAGNOSIS — I69351 Hemiplegia and hemiparesis following cerebral infarction affecting right dominant side: Secondary | ICD-10-CM | POA: Diagnosis not present

## 2021-03-15 DIAGNOSIS — I6523 Occlusion and stenosis of bilateral carotid arteries: Secondary | ICD-10-CM | POA: Diagnosis not present

## 2021-03-15 DIAGNOSIS — R4182 Altered mental status, unspecified: Secondary | ICD-10-CM

## 2021-03-15 DIAGNOSIS — I6621 Occlusion and stenosis of right posterior cerebral artery: Secondary | ICD-10-CM | POA: Diagnosis not present

## 2021-03-15 DIAGNOSIS — I6389 Other cerebral infarction: Secondary | ICD-10-CM | POA: Diagnosis not present

## 2021-03-15 DIAGNOSIS — N179 Acute kidney failure, unspecified: Secondary | ICD-10-CM | POA: Diagnosis not present

## 2021-03-15 DIAGNOSIS — S30851A Superficial foreign body of abdominal wall, initial encounter: Secondary | ICD-10-CM

## 2021-03-15 DIAGNOSIS — Z4659 Encounter for fitting and adjustment of other gastrointestinal appliance and device: Secondary | ICD-10-CM

## 2021-03-15 DIAGNOSIS — R414 Neurologic neglect syndrome: Secondary | ICD-10-CM | POA: Diagnosis not present

## 2021-03-15 DIAGNOSIS — F1410 Cocaine abuse, uncomplicated: Secondary | ICD-10-CM | POA: Diagnosis not present

## 2021-03-15 DIAGNOSIS — Z789 Other specified health status: Secondary | ICD-10-CM | POA: Diagnosis not present

## 2021-03-15 DIAGNOSIS — I6322 Cerebral infarction due to unspecified occlusion or stenosis of basilar arteries: Secondary | ICD-10-CM | POA: Diagnosis not present

## 2021-03-15 DIAGNOSIS — R0989 Other specified symptoms and signs involving the circulatory and respiratory systems: Secondary | ICD-10-CM | POA: Diagnosis not present

## 2021-03-15 DIAGNOSIS — R404 Transient alteration of awareness: Secondary | ICD-10-CM | POA: Diagnosis not present

## 2021-03-15 DIAGNOSIS — Z8673 Personal history of transient ischemic attack (TIA), and cerebral infarction without residual deficits: Secondary | ICD-10-CM | POA: Diagnosis not present

## 2021-03-15 DIAGNOSIS — R482 Apraxia: Secondary | ICD-10-CM | POA: Diagnosis present

## 2021-03-15 DIAGNOSIS — J9690 Respiratory failure, unspecified, unspecified whether with hypoxia or hypercapnia: Secondary | ICD-10-CM

## 2021-03-15 DIAGNOSIS — Z7982 Long term (current) use of aspirin: Secondary | ICD-10-CM

## 2021-03-15 DIAGNOSIS — E7800 Pure hypercholesterolemia, unspecified: Secondary | ICD-10-CM | POA: Diagnosis not present

## 2021-03-15 LAB — I-STAT ARTERIAL BLOOD GAS, ED
Acid-Base Excess: 0 mmol/L (ref 0.0–2.0)
Bicarbonate: 24.7 mmol/L (ref 20.0–28.0)
Calcium, Ion: 1.27 mmol/L (ref 1.15–1.40)
HCT: 42 % (ref 39.0–52.0)
Hemoglobin: 14.3 g/dL (ref 13.0–17.0)
O2 Saturation: 96 %
Patient temperature: 98.6
Potassium: 3.9 mmol/L (ref 3.5–5.1)
Sodium: 139 mmol/L (ref 135–145)
TCO2: 26 mmol/L (ref 22–32)
pCO2 arterial: 39.9 mmHg (ref 32.0–48.0)
pH, Arterial: 7.4 (ref 7.350–7.450)
pO2, Arterial: 80 mmHg — ABNORMAL LOW (ref 83.0–108.0)

## 2021-03-15 LAB — URINALYSIS, ROUTINE W REFLEX MICROSCOPIC
Bilirubin Urine: NEGATIVE
Glucose, UA: NEGATIVE mg/dL
Hgb urine dipstick: NEGATIVE
Ketones, ur: NEGATIVE mg/dL
Leukocytes,Ua: NEGATIVE
Nitrite: NEGATIVE
Protein, ur: NEGATIVE mg/dL
Specific Gravity, Urine: 1.023 (ref 1.005–1.030)
pH: 5 (ref 5.0–8.0)

## 2021-03-15 LAB — COMPREHENSIVE METABOLIC PANEL
ALT: 11 U/L (ref 0–44)
AST: 22 U/L (ref 15–41)
Albumin: 3.4 g/dL — ABNORMAL LOW (ref 3.5–5.0)
Alkaline Phosphatase: 47 U/L (ref 38–126)
Anion gap: 9 (ref 5–15)
BUN: 15 mg/dL (ref 8–23)
CO2: 23 mmol/L (ref 22–32)
Calcium: 8.8 mg/dL — ABNORMAL LOW (ref 8.9–10.3)
Chloride: 105 mmol/L (ref 98–111)
Creatinine, Ser: 1.3 mg/dL — ABNORMAL HIGH (ref 0.61–1.24)
GFR, Estimated: 60 mL/min — ABNORMAL LOW (ref >60)
Glucose, Bld: 82 mg/dL (ref 70–99)
Potassium: 4.1 mmol/L (ref 3.5–5.1)
Sodium: 137 mmol/L (ref 135–145)
Total Bilirubin: 0.8 mg/dL (ref 0.3–1.2)
Total Protein: 6.4 g/dL — ABNORMAL LOW (ref 6.5–8.1)

## 2021-03-15 LAB — CBC WITH DIFFERENTIAL/PLATELET
Abs Immature Granulocytes: 0.01 10*3/uL (ref 0.00–0.07)
Basophils Absolute: 0 10*3/uL (ref 0.0–0.1)
Basophils Relative: 0 %
Eosinophils Absolute: 0 10*3/uL (ref 0.0–0.5)
Eosinophils Relative: 0 %
HCT: 44.1 % (ref 39.0–52.0)
Hemoglobin: 13.9 g/dL (ref 13.0–17.0)
Immature Granulocytes: 0 %
Lymphocytes Relative: 12 %
Lymphs Abs: 0.9 10*3/uL (ref 0.7–4.0)
MCH: 29.8 pg (ref 26.0–34.0)
MCHC: 31.5 g/dL (ref 30.0–36.0)
MCV: 94.6 fL (ref 80.0–100.0)
Monocytes Absolute: 0.4 10*3/uL (ref 0.1–1.0)
Monocytes Relative: 5 %
Neutro Abs: 5.9 10*3/uL (ref 1.7–7.7)
Neutrophils Relative %: 83 %
Platelets: 203 10*3/uL (ref 150–400)
RBC: 4.66 MIL/uL (ref 4.22–5.81)
RDW: 13.3 % (ref 11.5–15.5)
WBC: 7.1 10*3/uL (ref 4.0–10.5)
nRBC: 0 % (ref 0.0–0.2)

## 2021-03-15 LAB — RESP PANEL BY RT-PCR (FLU A&B, COVID) ARPGX2
Influenza A by PCR: NEGATIVE
Influenza B by PCR: NEGATIVE
SARS Coronavirus 2 by RT PCR: NEGATIVE

## 2021-03-15 LAB — AMMONIA: Ammonia: 16 umol/L (ref 9–35)

## 2021-03-15 LAB — CBG MONITORING, ED: Glucose-Capillary: 81 mg/dL (ref 70–99)

## 2021-03-15 LAB — ETHANOL: Alcohol, Ethyl (B): 16 mg/dL — ABNORMAL HIGH (ref <10)

## 2021-03-15 LAB — LACTIC ACID, PLASMA: Lactic Acid, Venous: 2.8 mmol/L (ref 0.5–1.9)

## 2021-03-15 MED ORDER — LORAZEPAM 1 MG PO TABS: 0.0000 mg | ORAL_TABLET | Freq: Four times a day (QID) | ORAL | Status: AC

## 2021-03-15 MED ORDER — LORAZEPAM 2 MG/ML IJ SOLN: 0.0000 mg | Freq: Two times a day (BID) | INTRAMUSCULAR | Status: AC

## 2021-03-15 MED ORDER — THIAMINE HCL 100 MG/ML IJ SOLN
100.0000 mg | Freq: Once | INTRAMUSCULAR | Status: AC
  Administered 2021-03-15: 100 mg via INTRAVENOUS
  Filled 2021-03-15: qty 2

## 2021-03-15 MED ORDER — LORAZEPAM 2 MG/ML IJ SOLN: 0.0000 mg | Freq: Four times a day (QID) | INTRAMUSCULAR | Status: AC

## 2021-03-15 MED ORDER — LORAZEPAM 1 MG PO TABS
0.0000 mg | ORAL_TABLET | Freq: Two times a day (BID) | ORAL | Status: AC
  Filled 2021-03-15: qty 1

## 2021-03-15 MED ORDER — SODIUM CHLORIDE 0.9 % IV SOLN: INTRAVENOUS | Status: DC
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
## 2021-03-15 NOTE — ED Triage Notes (Signed)
BIB [facility] found on ground outside of an apartment. ALOC.Odor of ETOH, garbled speech, CBG 96, PIV 20ga Rt FA, hx of CVA. Unresponsive with family, responded to pain with EMS now responds to voice now, gurgling respirations.

## 2021-03-15 NOTE — ED Notes (Signed)
Lynder Parents lives with this person [phone]
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
## 2021-03-15 NOTE — ED Notes (Signed)
Provider at bedside

## 2021-03-15 NOTE — ED Notes (Signed)
Patient transported to CT 

## 2021-03-15 NOTE — ED Notes (Signed)
Radiology at bedside

## 2021-03-16 ENCOUNTER — Encounter (HOSPITAL_COMMUNITY)
Admission: EM | Disposition: A | Discharge status: Skilled Nursing Facility | Payer: Self-pay | Source: Home / Self Care | Attending: Neurology

## 2021-03-16 ENCOUNTER — Inpatient Hospital Stay (HOSPITAL_COMMUNITY): Payer: Medicare Other

## 2021-03-16 ENCOUNTER — Emergency Department (HOSPITAL_COMMUNITY): Payer: Medicare Other | Admitting: Anesthesiology

## 2021-03-16 ENCOUNTER — Emergency Department (HOSPITAL_COMMUNITY): Payer: Medicare Other

## 2021-03-16 ENCOUNTER — Encounter (HOSPITAL_COMMUNITY): Payer: Self-pay | Admitting: Interventional Radiology

## 2021-03-16 DIAGNOSIS — E876 Hypokalemia: Secondary | ICD-10-CM | POA: Diagnosis not present

## 2021-03-16 DIAGNOSIS — E785 Hyperlipidemia, unspecified: Secondary | ICD-10-CM | POA: Diagnosis not present

## 2021-03-16 DIAGNOSIS — Y900 Blood alcohol level of less than 20 mg/100 ml: Secondary | ICD-10-CM | POA: Diagnosis present

## 2021-03-16 DIAGNOSIS — I6302 Cerebral infarction due to thrombosis of basilar artery: Secondary | ICD-10-CM | POA: Diagnosis not present

## 2021-03-16 DIAGNOSIS — I639 Cerebral infarction, unspecified: Secondary | ICD-10-CM | POA: Diagnosis not present

## 2021-03-16 DIAGNOSIS — N179 Acute kidney failure, unspecified: Secondary | ICD-10-CM | POA: Diagnosis not present

## 2021-03-16 DIAGNOSIS — F17200 Nicotine dependence, unspecified, uncomplicated: Secondary | ICD-10-CM | POA: Diagnosis not present

## 2021-03-16 DIAGNOSIS — F1410 Cocaine abuse, uncomplicated: Secondary | ICD-10-CM | POA: Diagnosis present

## 2021-03-16 DIAGNOSIS — S2241XA Multiple fractures of ribs, right side, initial encounter for closed fracture: Secondary | ICD-10-CM | POA: Diagnosis not present

## 2021-03-16 DIAGNOSIS — I160 Hypertensive urgency: Secondary | ICD-10-CM | POA: Diagnosis not present

## 2021-03-16 DIAGNOSIS — R414 Neurologic neglect syndrome: Secondary | ICD-10-CM | POA: Diagnosis present

## 2021-03-16 DIAGNOSIS — E872 Acidosis, unspecified: Secondary | ICD-10-CM | POA: Diagnosis present

## 2021-03-16 DIAGNOSIS — Z8673 Personal history of transient ischemic attack (TIA), and cerebral infarction without residual deficits: Secondary | ICD-10-CM | POA: Diagnosis not present

## 2021-03-16 DIAGNOSIS — Z9889 Other specified postprocedural states: Secondary | ICD-10-CM | POA: Diagnosis not present

## 2021-03-16 DIAGNOSIS — K6389 Other specified diseases of intestine: Secondary | ICD-10-CM | POA: Diagnosis not present

## 2021-03-16 DIAGNOSIS — Z9114 Patient's other noncompliance with medication regimen: Secondary | ICD-10-CM | POA: Diagnosis not present

## 2021-03-16 DIAGNOSIS — I6381 Other cerebral infarction due to occlusion or stenosis of small artery: Secondary | ICD-10-CM | POA: Diagnosis not present

## 2021-03-16 DIAGNOSIS — I6523 Occlusion and stenosis of bilateral carotid arteries: Secondary | ICD-10-CM | POA: Diagnosis not present

## 2021-03-16 DIAGNOSIS — I10 Essential (primary) hypertension: Secondary | ICD-10-CM

## 2021-03-16 DIAGNOSIS — I63231 Cerebral infarction due to unspecified occlusion or stenosis of right carotid arteries: Secondary | ICD-10-CM | POA: Diagnosis not present

## 2021-03-16 DIAGNOSIS — Z789 Other specified health status: Secondary | ICD-10-CM | POA: Diagnosis not present

## 2021-03-16 DIAGNOSIS — Z8616 Personal history of COVID-19: Secondary | ICD-10-CM | POA: Diagnosis not present

## 2021-03-16 DIAGNOSIS — I6322 Cerebral infarction due to unspecified occlusion or stenosis of basilar arteries: Secondary | ICD-10-CM | POA: Diagnosis present

## 2021-03-16 DIAGNOSIS — Z20822 Contact with and (suspected) exposure to covid-19: Secondary | ICD-10-CM | POA: Diagnosis not present

## 2021-03-16 DIAGNOSIS — G319 Degenerative disease of nervous system, unspecified: Secondary | ICD-10-CM | POA: Diagnosis not present

## 2021-03-16 DIAGNOSIS — I672 Cerebral atherosclerosis: Secondary | ICD-10-CM | POA: Diagnosis not present

## 2021-03-16 DIAGNOSIS — J9601 Acute respiratory failure with hypoxia: Secondary | ICD-10-CM | POA: Diagnosis not present

## 2021-03-16 DIAGNOSIS — Z806 Family history of leukemia: Secondary | ICD-10-CM | POA: Diagnosis not present

## 2021-03-16 DIAGNOSIS — I63233 Cerebral infarction due to unspecified occlusion or stenosis of bilateral carotid arteries: Secondary | ICD-10-CM | POA: Diagnosis not present

## 2021-03-16 DIAGNOSIS — J9690 Respiratory failure, unspecified, unspecified whether with hypoxia or hypercapnia: Secondary | ICD-10-CM | POA: Diagnosis not present

## 2021-03-16 DIAGNOSIS — G9389 Other specified disorders of brain: Secondary | ICD-10-CM | POA: Diagnosis not present

## 2021-03-16 DIAGNOSIS — G8191 Hemiplegia, unspecified affecting right dominant side: Secondary | ICD-10-CM | POA: Diagnosis not present

## 2021-03-16 DIAGNOSIS — I651 Occlusion and stenosis of basilar artery: Secondary | ICD-10-CM | POA: Diagnosis present

## 2021-03-16 DIAGNOSIS — I6602 Occlusion and stenosis of left middle cerebral artery: Secondary | ICD-10-CM | POA: Diagnosis not present

## 2021-03-16 DIAGNOSIS — R29709 NIHSS score 9: Secondary | ICD-10-CM | POA: Diagnosis present

## 2021-03-16 DIAGNOSIS — F1010 Alcohol abuse, uncomplicated: Secondary | ICD-10-CM

## 2021-03-16 DIAGNOSIS — E7800 Pure hypercholesterolemia, unspecified: Secondary | ICD-10-CM | POA: Diagnosis not present

## 2021-03-16 DIAGNOSIS — H518 Other specified disorders of binocular movement: Secondary | ICD-10-CM | POA: Diagnosis present

## 2021-03-16 DIAGNOSIS — I6389 Other cerebral infarction: Secondary | ICD-10-CM | POA: Diagnosis not present

## 2021-03-16 DIAGNOSIS — I6621 Occlusion and stenosis of right posterior cerebral artery: Secondary | ICD-10-CM | POA: Diagnosis not present

## 2021-03-16 DIAGNOSIS — I69351 Hemiplegia and hemiparesis following cerebral infarction affecting right dominant side: Secondary | ICD-10-CM | POA: Diagnosis not present

## 2021-03-16 DIAGNOSIS — R29810 Facial weakness: Secondary | ICD-10-CM | POA: Diagnosis present

## 2021-03-16 DIAGNOSIS — Z4682 Encounter for fitting and adjustment of non-vascular catheter: Secondary | ICD-10-CM | POA: Diagnosis not present

## 2021-03-16 DIAGNOSIS — I63541 Cerebral infarction due to unspecified occlusion or stenosis of right cerebellar artery: Secondary | ICD-10-CM | POA: Diagnosis not present

## 2021-03-16 DIAGNOSIS — Z9049 Acquired absence of other specified parts of digestive tract: Secondary | ICD-10-CM | POA: Diagnosis not present

## 2021-03-16 DIAGNOSIS — I63331 Cerebral infarction due to thrombosis of right posterior cerebral artery: Secondary | ICD-10-CM | POA: Diagnosis not present

## 2021-03-16 DIAGNOSIS — Z8249 Family history of ischemic heart disease and other diseases of the circulatory system: Secondary | ICD-10-CM | POA: Diagnosis not present

## 2021-03-16 DIAGNOSIS — Z7401 Bed confinement status: Secondary | ICD-10-CM | POA: Diagnosis not present

## 2021-03-16 DIAGNOSIS — F17210 Nicotine dependence, cigarettes, uncomplicated: Secondary | ICD-10-CM | POA: Diagnosis present

## 2021-03-16 DIAGNOSIS — M2550 Pain in unspecified joint: Secondary | ICD-10-CM | POA: Diagnosis not present

## 2021-03-16 DIAGNOSIS — R471 Dysarthria and anarthria: Secondary | ICD-10-CM | POA: Diagnosis present

## 2021-03-16 DIAGNOSIS — R4182 Altered mental status, unspecified: Secondary | ICD-10-CM | POA: Diagnosis not present

## 2021-03-16 HISTORY — PX: IR ANGIO INTRA EXTRACRAN SEL COM CAROTID INNOMINATE UNI R MOD SED: IMG5359

## 2021-03-16 HISTORY — PX: IR CT HEAD LTD: IMG2386

## 2021-03-16 HISTORY — PX: IR PERCUTANEOUS ART THROMBECTOMY/INFUSION INTRACRANIAL INC DIAG ANGIO: IMG6087

## 2021-03-16 HISTORY — PX: IR ANGIO VERTEBRAL SEL VERTEBRAL UNI L MOD SED: IMG5367

## 2021-03-16 HISTORY — PX: RADIOLOGY WITH ANESTHESIA: SHX6223

## 2021-03-16 LAB — CBC WITH DIFFERENTIAL/PLATELET
Abs Immature Granulocytes: 0.01 10*3/uL (ref 0.00–0.07)
Abs Immature Granulocytes: 0.03 10*3/uL (ref 0.00–0.07)
Basophils Absolute: 0 10*3/uL (ref 0.0–0.1)
Basophils Absolute: 0 10*3/uL (ref 0.0–0.1)
Basophils Relative: 0 %
Basophils Relative: 0 %
Eosinophils Absolute: 0 10*3/uL (ref 0.0–0.5)
Eosinophils Absolute: 0 10*3/uL (ref 0.0–0.5)
Eosinophils Relative: 1 %
Eosinophils Relative: 0 %
HCT: 38 % — ABNORMAL LOW (ref 39.0–52.0)
HCT: 40 % (ref 39.0–52.0)
Hemoglobin: 12 g/dL — ABNORMAL LOW (ref 13.0–17.0)
Hemoglobin: 12.4 g/dL — ABNORMAL LOW (ref 13.0–17.0)
Immature Granulocytes: 0 %
Immature Granulocytes: 0 %
Lymphocytes Relative: 11 %
Lymphocytes Relative: 12 %
Lymphs Abs: 0.7 10*3/uL (ref 0.7–4.0)
Lymphs Abs: 1.1 10*3/uL (ref 0.7–4.0)
MCH: 29.7 pg (ref 26.0–34.0)
MCH: 29.7 pg (ref 26.0–34.0)
MCHC: 31.6 g/dL (ref 30.0–36.0)
MCHC: 31 g/dL (ref 30.0–36.0)
MCV: 94.1 fL (ref 80.0–100.0)
MCV: 95.7 fL (ref 80.0–100.0)
Monocytes Absolute: 0.2 10*3/uL (ref 0.1–1.0)
Monocytes Absolute: 1 10*3/uL (ref 0.1–1.0)
Monocytes Relative: 3 %
Monocytes Relative: 11 %
Neutro Abs: 4.9 10*3/uL (ref 1.7–7.7)
Neutro Abs: 7.5 10*3/uL (ref 1.7–7.7)
Neutrophils Relative %: 85 %
Neutrophils Relative %: 77 %
Platelets: 183 10*3/uL (ref 150–400)
Platelets: 167 10*3/uL (ref 150–400)
RBC: 4.04 MIL/uL — ABNORMAL LOW (ref 4.22–5.81)
RBC: 4.18 MIL/uL — ABNORMAL LOW (ref 4.22–5.81)
RDW: 13.3 % (ref 11.5–15.5)
RDW: 13.5 % (ref 11.5–15.5)
WBC: 5.8 10*3/uL (ref 4.0–10.5)
WBC: 9.7 10*3/uL (ref 4.0–10.5)
nRBC: 0 % (ref 0.0–0.2)
nRBC: 0 % (ref 0.0–0.2)

## 2021-03-16 LAB — I-STAT CHEM 8, ED
BUN: 19 mg/dL (ref 8–23)
Calcium, Ion: 1.23 mmol/L (ref 1.15–1.40)
Chloride: 104 mmol/L (ref 98–111)
Creatinine, Ser: 1.4 mg/dL — ABNORMAL HIGH (ref 0.61–1.24)
Glucose, Bld: 69 mg/dL — ABNORMAL LOW (ref 70–99)
HCT: 45 % (ref 39.0–52.0)
Hemoglobin: 15.3 g/dL (ref 13.0–17.0)
Potassium: 4 mmol/L (ref 3.5–5.1)
Sodium: 141 mmol/L (ref 135–145)
TCO2: 28 mmol/L (ref 22–32)

## 2021-03-16 LAB — BASIC METABOLIC PANEL
Anion gap: 8 (ref 5–15)
BUN: 12 mg/dL (ref 8–23)
CO2: 21 mmol/L — ABNORMAL LOW (ref 22–32)
Calcium: 8.1 mg/dL — ABNORMAL LOW (ref 8.9–10.3)
Chloride: 110 mmol/L (ref 98–111)
Creatinine, Ser: 1.13 mg/dL (ref 0.61–1.24)
GFR, Estimated: >60 mL/min (ref >60)
Glucose, Bld: 112 mg/dL — ABNORMAL HIGH (ref 70–99)
Potassium: 3.5 mmol/L (ref 3.5–5.1)
Sodium: 139 mmol/L (ref 135–145)

## 2021-03-16 LAB — PROTIME-INR
INR: 1.1 (ref 0.8–1.2)
Prothrombin Time: 14.5 seconds (ref 11.4–15.2)

## 2021-03-16 LAB — POCT I-STAT 7, (LYTES, BLD GAS, ICA,H+H)
Acid-base deficit: 2 mmol/L (ref 0.0–2.0)
Bicarbonate: 24.4 mmol/L (ref 20.0–28.0)
Calcium, Ion: 1.24 mmol/L (ref 1.15–1.40)
HCT: 38 % — ABNORMAL LOW (ref 39.0–52.0)
Hemoglobin: 12.9 g/dL — ABNORMAL LOW (ref 13.0–17.0)
O2 Saturation: 100 %
Potassium: 3.6 mmol/L (ref 3.5–5.1)
Sodium: 140 mmol/L (ref 135–145)
TCO2: 26 mmol/L (ref 22–32)
pCO2 arterial: 48.4 mmHg — ABNORMAL HIGH (ref 32.0–48.0)
pH, Arterial: 7.311 — ABNORMAL LOW (ref 7.350–7.450)
pO2, Arterial: 434 mmHg — ABNORMAL HIGH (ref 83.0–108.0)

## 2021-03-16 LAB — ECHOCARDIOGRAM COMPLETE
AR max vel: 3.23 cm2
AV Peak grad: 9.6 mmHg
Ao pk vel: 1.55 m/s
Area-P 1/2: 3.91 cm2
Calc EF: 52.4 %
Height: 72 in
S' Lateral: 3.3 cm
Single Plane A2C EF: 52.2 %
Single Plane A4C EF: 51.9 %
Weight: 2560.86 oz

## 2021-03-16 LAB — RAPID URINE DRUG SCREEN, HOSP PERFORMED
Amphetamines: NOT DETECTED
Barbiturates: NOT DETECTED
Benzodiazepines: NOT DETECTED
Cocaine: POSITIVE — AB
Opiates: NOT DETECTED
Tetrahydrocannabinol: NOT DETECTED

## 2021-03-16 LAB — LIPID PANEL
Cholesterol: 135 mg/dL (ref 0–200)
HDL: 71 mg/dL (ref >40)
LDL Cholesterol: 54 mg/dL (ref 0–99)
Total CHOL/HDL Ratio: 1.9 RATIO
Triglycerides: 49 mg/dL (ref <150)
VLDL: 10 mg/dL (ref 0–40)

## 2021-03-16 LAB — HIV ANTIBODY (ROUTINE TESTING W REFLEX): HIV Screen 4th Generation wRfx: NONREACTIVE

## 2021-03-16 LAB — MAGNESIUM: Magnesium: 1.9 mg/dL (ref 1.7–2.4)

## 2021-03-16 LAB — HEMOGLOBIN A1C
Hgb A1c MFr Bld: 4.6 % — ABNORMAL LOW (ref 4.8–5.6)
Mean Plasma Glucose: 85.32 mg/dL

## 2021-03-16 LAB — LACTIC ACID, PLASMA: Lactic Acid, Venous: 2.1 mmol/L (ref 0.5–1.9)

## 2021-03-16 LAB — MRSA NEXT GEN BY PCR, NASAL: MRSA by PCR Next Gen: NOT DETECTED

## 2021-03-16 LAB — APTT: aPTT: 113 seconds — ABNORMAL HIGH (ref 24–36)

## 2021-03-16 SURGERY — IR WITH ANESTHESIA
Anesthesia: General

## 2021-03-16 MED ORDER — TICAGRELOR 90 MG PO TABS
90.0000 mg | ORAL_TABLET | Freq: Two times a day (BID) | ORAL | Status: DC
  Administered 2021-03-16: 90 mg
  Filled 2021-03-16 (×3): qty 1

## 2021-03-16 MED ORDER — TIROFIBAN HCL IN NACL 5-0.9 MG/100ML-% IV SOLN
INTRAVENOUS | Status: AC
  Filled 2021-03-16: qty 100

## 2021-03-16 MED ORDER — ENOXAPARIN SODIUM 40 MG/0.4ML IJ SOSY
40.0000 mg | PREFILLED_SYRINGE | INTRAMUSCULAR | Status: DC
  Administered 2021-03-16 – 2021-03-20 (×5): 40 mg via SUBCUTANEOUS
  Filled 2021-03-16 (×5): qty 0.4

## 2021-03-16 MED ORDER — PANTOPRAZOLE SODIUM 40 MG PO TBEC
40.0000 mg | DELAYED_RELEASE_TABLET | Freq: Every day | ORAL | Status: DC
  Administered 2021-03-16 – 2021-03-20 (×5): 40 mg via ORAL
  Filled 2021-03-16 (×5): qty 1

## 2021-03-16 MED ORDER — DOCUSATE SODIUM 100 MG PO CAPS
100.0000 mg | ORAL_CAPSULE | Freq: Two times a day (BID) | ORAL | Status: DC
  Administered 2021-03-16 – 2021-03-19 (×7): 100 mg via ORAL
  Filled 2021-03-16 (×8): qty 1

## 2021-03-16 MED ORDER — SODIUM CHLORIDE 0.9 % IV SOLN
1.0000 mg | Freq: Once | INTRAVENOUS | Status: AC
  Administered 2021-03-16: 1 mg via INTRAVENOUS
  Filled 2021-03-16: qty 0.2

## 2021-03-16 MED ORDER — TICAGRELOR 90 MG PO TABS
ORAL_TABLET | ORAL | Status: AC
  Filled 2021-03-16: qty 2

## 2021-03-16 MED ORDER — CANGRELOR BOLUS VIA INFUSION
INTRAVENOUS | Status: AC | PRN
  Administered 2021-03-16: 1089 ug via INTRAVENOUS

## 2021-03-16 MED ORDER — LIDOCAINE HCL (CARDIAC) PF 100 MG/5ML IV SOSY
PREFILLED_SYRINGE | INTRAVENOUS | Status: DC | PRN
  Administered 2021-03-16: 60 mg via INTRAVENOUS

## 2021-03-16 MED ORDER — ASPIRIN 81 MG PO CHEW
CHEWABLE_TABLET | ORAL | Status: AC
  Filled 2021-03-16: qty 1

## 2021-03-16 MED ORDER — CHLORHEXIDINE GLUCONATE CLOTH 2 % EX PADS: 6.0000 | MEDICATED_PAD | Freq: Every day | CUTANEOUS | Status: DC

## 2021-03-16 MED ORDER — THIAMINE HCL 100 MG/ML IJ SOLN
100.0000 mg | Freq: Every day | INTRAMUSCULAR | Status: DC
  Administered 2021-03-16: 100 mg via INTRAVENOUS
  Filled 2021-03-16: qty 2

## 2021-03-16 MED ORDER — SODIUM CHLORIDE (PF) 0.9 % IJ SOLN
INTRAVENOUS | Status: AC | PRN
  Administered 2021-03-16 (×2): 50 ug via INTRA_ARTERIAL

## 2021-03-16 MED ORDER — CEFAZOLIN SODIUM-DEXTROSE 2-3 GM-%(50ML) IV SOLR
INTRAVENOUS | Status: DC | PRN
  Administered 2021-03-16: 2 g via INTRAVENOUS

## 2021-03-16 MED ORDER — PROPOFOL 500 MG/50ML IV EMUL
INTRAVENOUS | Status: DC | PRN
  Administered 2021-03-16: 75 ug/kg/min via INTRAVENOUS

## 2021-03-16 MED ORDER — PROSIGHT PO TABS
1.0000 | ORAL_TABLET | Freq: Every day | ORAL | Status: DC
Start: 2021-03-17 — End: 2021-03-21
  Administered 2021-03-17 – 2021-03-20 (×4): 1 via ORAL
  Filled 2021-03-16 (×4): qty 1

## 2021-03-16 MED ORDER — ROCURONIUM 10MG/ML (10ML) SYRINGE FOR MEDFUSION PUMP - OPTIME
INTRAVENOUS | Status: DC | PRN
  Administered 2021-03-16: 70 mg via INTRAVENOUS

## 2021-03-16 MED ORDER — ACETAMINOPHEN 325 MG PO TABS: 650.0000 mg | ORAL_TABLET | ORAL | Status: DC | PRN

## 2021-03-16 MED ORDER — VERAPAMIL HCL 2.5 MG/ML IV SOLN
INTRAVENOUS | Status: AC
  Filled 2021-03-16: qty 2

## 2021-03-16 MED ORDER — ACETAMINOPHEN 325 MG PO TABS
650.0000 mg | ORAL_TABLET | ORAL | Status: DC | PRN
  Administered 2021-03-18: 650 mg via ORAL
  Filled 2021-03-16: qty 2

## 2021-03-16 MED ORDER — CHLORHEXIDINE GLUCONATE 0.12% ORAL RINSE (MEDLINE KIT)
15.0000 mL | Freq: Two times a day (BID) | OROMUCOSAL | Status: DC
  Administered 2021-03-16: 15 mL via OROMUCOSAL

## 2021-03-16 MED ORDER — PHENYLEPHRINE HCL (PRESSORS) 10 MG/ML IV SOLN
INTRAVENOUS | Status: DC | PRN
  Administered 2021-03-16 (×3): 80 ug via INTRAVENOUS
  Administered 2021-03-16: 120 ug via INTRAVENOUS

## 2021-03-16 MED ORDER — CANGRELOR BOLUS VIA INFUSION
30.0000 ug/kg | Freq: Once | INTRAVENOUS | Status: DC
  Filled 2021-03-16: qty 2178

## 2021-03-16 MED ORDER — PROPOFOL 10 MG/ML IV BOLUS
INTRAVENOUS | Status: DC | PRN
  Administered 2021-03-16: 50 mg via INTRAVENOUS
  Administered 2021-03-16: 100 mg via INTRAVENOUS

## 2021-03-16 MED ORDER — FOLIC ACID 1 MG PO TABS
1.0000 mg | ORAL_TABLET | Freq: Every day | ORAL | Status: DC
  Administered 2021-03-17 – 2021-03-20 (×4): 1 mg via ORAL
  Filled 2021-03-16 (×4): qty 1

## 2021-03-16 MED ORDER — SODIUM CHLORIDE 0.9 % IV SOLN: INTRAVENOUS | Status: AC | PRN

## 2021-03-16 MED ORDER — ATORVASTATIN CALCIUM 80 MG PO TABS
80.0000 mg | ORAL_TABLET | Freq: Every day | ORAL | Status: DC
  Administered 2021-03-17 – 2021-03-20 (×4): 80 mg via ORAL
  Filled 2021-03-16 (×4): qty 1

## 2021-03-16 MED ORDER — POTASSIUM CHLORIDE 20 MEQ PO PACK
40.0000 meq | PACK | Freq: Once | ORAL | Status: AC
  Administered 2021-03-16: 40 meq
  Filled 2021-03-16: qty 2

## 2021-03-16 MED ORDER — CHLORHEXIDINE GLUCONATE CLOTH 2 % EX PADS
6.0000 | MEDICATED_PAD | Freq: Every day | CUTANEOUS | Status: DC
  Administered 2021-03-16 (×2): 6 via TOPICAL

## 2021-03-16 MED ORDER — ASPIRIN 81 MG PO CHEW
81.0000 mg | CHEWABLE_TABLET | Freq: Every day | ORAL | Status: DC
  Administered 2021-03-16: 81 mg
  Filled 2021-03-16: qty 1

## 2021-03-16 MED ORDER — CANGRELOR TETRASODIUM 50 MG IV SOLR
INTRAVENOUS | Status: AC
  Filled 2021-03-16: qty 50

## 2021-03-16 MED ORDER — CLEVIDIPINE BUTYRATE 0.5 MG/ML IV EMUL
0.0000 mg/h | INTRAVENOUS | Status: DC
  Administered 2021-03-16: 12 mg/h via INTRAVENOUS

## 2021-03-16 MED ORDER — PROPOFOL 1000 MG/100ML IV EMUL
0.0000 ug/kg/min | INTRAVENOUS | Status: DC
  Administered 2021-03-16: 50 ug/kg/min via INTRAVENOUS
  Filled 2021-03-16: qty 100

## 2021-03-16 MED ORDER — CLEVIDIPINE BUTYRATE 0.5 MG/ML IV EMUL: 0.0000 mg/h | INTRAVENOUS | Status: DC

## 2021-03-16 MED ORDER — SODIUM CHLORIDE 0.9 % IV SOLN: INTRAVENOUS | Status: DC

## 2021-03-16 MED ORDER — POLYETHYLENE GLYCOL 3350 17 G PO PACK
17.0000 g | PACK | Freq: Every day | ORAL | Status: DC
  Administered 2021-03-17: 17 g via ORAL
  Filled 2021-03-16 (×3): qty 1

## 2021-03-16 MED ORDER — CEFAZOLIN SODIUM-DEXTROSE 2-4 GM/100ML-% IV SOLN
INTRAVENOUS | Status: AC
  Filled 2021-03-16: qty 100

## 2021-03-16 MED ORDER — ASPIRIN 325 MG PO TABS
ORAL_TABLET | ORAL | Status: AC | PRN
  Administered 2021-03-16: 81 mg

## 2021-03-16 MED ORDER — IOHEXOL 350 MG/ML SOLN
80.0000 mL | Freq: Once | INTRAVENOUS | Status: AC | PRN
  Administered 2021-03-16: 80 mL via INTRAVENOUS

## 2021-03-16 MED ORDER — NITROGLYCERIN 1 MG/10 ML FOR IR/CATH LAB
INTRA_ARTERIAL | Status: AC
  Filled 2021-03-16: qty 10

## 2021-03-16 MED ORDER — LABETALOL HCL 5 MG/ML IV SOLN
INTRAVENOUS | Status: DC | PRN
  Administered 2021-03-16: 10 mg via INTRAVENOUS

## 2021-03-16 MED ORDER — SUCCINYLCHOLINE 20MG/ML (10ML) SYRINGE FOR MEDFUSION PUMP - OPTIME
INTRAMUSCULAR | Status: DC | PRN
  Administered 2021-03-16: 100 mg via INTRAVENOUS

## 2021-03-16 MED ORDER — PANTOPRAZOLE SODIUM 40 MG IV SOLR
40.0000 mg | Freq: Every day | INTRAVENOUS | Status: DC
  Administered 2021-03-16: 40 mg via INTRAVENOUS
  Filled 2021-03-16: qty 40

## 2021-03-16 MED ORDER — POLYETHYLENE GLYCOL 3350 17 G PO PACK
17.0000 g | PACK | Freq: Every day | ORAL | Status: DC
  Administered 2021-03-16: 17 g
  Filled 2021-03-16: qty 1

## 2021-03-16 MED ORDER — FENTANYL CITRATE PF 50 MCG/ML IJ SOSY
25.0000 ug | PREFILLED_SYRINGE | INTRAMUSCULAR | Status: DC | PRN
  Administered 2021-03-16: 50 ug via INTRAVENOUS
  Filled 2021-03-16: qty 1

## 2021-03-16 MED ORDER — ATORVASTATIN CALCIUM 80 MG PO TABS: 80.0000 mg | ORAL_TABLET | Freq: Every day | ORAL | Status: DC

## 2021-03-16 MED ORDER — SENNOSIDES-DOCUSATE SODIUM 8.6-50 MG PO TABS: 1.0000 | ORAL_TABLET | Freq: Every evening | ORAL | Status: DC | PRN

## 2021-03-16 MED ORDER — ACETAMINOPHEN 650 MG RE SUPP: 650.0000 mg | RECTAL | Status: DC | PRN

## 2021-03-16 MED ORDER — ASPIRIN 81 MG PO CHEW
81.0000 mg | CHEWABLE_TABLET | Freq: Every day | ORAL | Status: DC
  Administered 2021-03-17 – 2021-03-20 (×4): 81 mg via ORAL
  Filled 2021-03-16 (×4): qty 1

## 2021-03-16 MED ORDER — ACETAMINOPHEN 160 MG/5ML PO SOLN: 650.0000 mg | ORAL | Status: DC | PRN

## 2021-03-16 MED ORDER — CLEVIDIPINE BUTYRATE 0.5 MG/ML IV EMUL
0.0000 mg/h | INTRAVENOUS | Status: DC
  Administered 2021-03-16: 8 mg/h via INTRAVENOUS
  Administered 2021-03-16: 2 mg/h via INTRAVENOUS
  Filled 2021-03-16 (×2): qty 50

## 2021-03-16 MED ORDER — THIAMINE HCL 100 MG PO TABS
100.0000 mg | ORAL_TABLET | Freq: Every day | ORAL | Status: DC
  Administered 2021-03-17 – 2021-03-20 (×4): 100 mg via ORAL
  Filled 2021-03-16 (×4): qty 1

## 2021-03-16 MED ORDER — TICAGRELOR 60 MG PO TABS
ORAL_TABLET | ORAL | Status: AC | PRN
  Administered 2021-03-16: 180 mg

## 2021-03-16 MED ORDER — FENTANYL CITRATE PF 50 MCG/ML IJ SOSY: 25.0000 ug | PREFILLED_SYRINGE | INTRAMUSCULAR | Status: DC | PRN

## 2021-03-16 MED ORDER — ONDANSETRON HCL 4 MG/2ML IJ SOLN
INTRAMUSCULAR | Status: DC | PRN
  Administered 2021-03-16: 4 mg via INTRAVENOUS

## 2021-03-16 MED ORDER — STROKE: EARLY STAGES OF RECOVERY BOOK
Freq: Once | Status: AC
  Filled 2021-03-16: qty 1

## 2021-03-16 MED ORDER — CLOPIDOGREL BISULFATE 300 MG PO TABS
ORAL_TABLET | ORAL | Status: AC
  Filled 2021-03-16: qty 1

## 2021-03-16 MED ORDER — EPTIFIBATIDE 20 MG/10ML IV SOLN
INTRAVENOUS | Status: AC
  Filled 2021-03-16: qty 10

## 2021-03-16 MED ORDER — PHENYLEPHRINE HCL-NACL 20-0.9 MG/250ML-% IV SOLN
INTRAVENOUS | Status: DC | PRN
  Administered 2021-03-16: 25 ug/min via INTRAVENOUS

## 2021-03-16 MED ORDER — EPTIFIBATIDE 20 MG/10ML IV SOLN
INTRAVENOUS | Status: AC | PRN
  Administered 2021-03-16 (×2): 1 mL via INTRAVENOUS

## 2021-03-16 MED ORDER — ORAL CARE MOUTH RINSE
15.0000 mL | Freq: Two times a day (BID) | OROMUCOSAL | Status: DC
  Administered 2021-03-16: 15 mL via OROMUCOSAL

## 2021-03-16 MED ORDER — DEXAMETHASONE SODIUM PHOSPHATE 10 MG/ML IJ SOLN
INTRAMUSCULAR | Status: DC | PRN
  Administered 2021-03-16: 10 mg via INTRAVENOUS

## 2021-03-16 MED ORDER — CANGRELOR TETRASODIUM 50 MG IV SOLR
2.0000 ug/kg/min | INTRAVENOUS | Status: DC
  Administered 2021-03-16: 2 ug/kg/min via INTRAVENOUS
  Filled 2021-03-16 (×2): qty 50

## 2021-03-16 MED ORDER — TICAGRELOR 90 MG PO TABS
90.0000 mg | ORAL_TABLET | Freq: Two times a day (BID) | ORAL | Status: DC
  Administered 2021-03-16 – 2021-03-20 (×8): 90 mg via ORAL
  Filled 2021-03-16 (×8): qty 1

## 2021-03-16 MED ORDER — SODIUM CHLORIDE 0.9 % IV BOLUS
1000.0000 mL | Freq: Once | INTRAVENOUS | Status: AC
  Administered 2021-03-15: 1000 mL via INTRAVENOUS

## 2021-03-16 MED ORDER — DOCUSATE SODIUM 50 MG/5ML PO LIQD
100.0000 mg | Freq: Two times a day (BID) | ORAL | Status: DC
  Administered 2021-03-16: 100 mg
  Filled 2021-03-16: qty 10

## 2021-03-16 MED ORDER — ORAL CARE MOUTH RINSE
15.0000 mL | OROMUCOSAL | Status: DC
  Administered 2021-03-16: 15 mL via OROMUCOSAL

## 2021-03-16 NOTE — Procedures (Signed)
Extubation Procedure Note  Patient Details:   Name: Andre [name] DOB: 1951-01-01 MRN: [id number]   Airway Documentation:    Vent end date: 03/16/21 Vent end time: 1117   Evaluation  O2 sats: stable throughout Complications: No apparent complications Patient did tolerate procedure well. Bilateral Breath Sounds: Clear, Diminished   Yes  Pt extubated to 3L [place]. Pt tolerating well, no stridor noted, cuff leak present, RN at bedside, vitals stable, RT will continue to monitor.   Carren Rang 03/16/2021, 11:36 AM

## 2021-03-16 NOTE — Anesthesia Postprocedure Evaluation (Signed)
Anesthesia Post Note  Patient: Andre [name]  Procedure(s) Performed: IR WITH ANESTHESIA     Patient location during evaluation: SICU Anesthesia Type: General Level of consciousness: sedated Pain management: pain level controlled Vital Signs Assessment: post-procedure vital signs reviewed and stable Respiratory status: patient remains intubated per anesthesia plan Cardiovascular status: stable Postop Assessment: no apparent nausea or vomiting Anesthetic complications: no   No notable events documented.  Last Vitals:  Vitals:   03/16/21 0615 03/16/21 0630  BP: 112/85 134/74  Pulse: 74 75  Resp: (!) 25 (!) 28  Temp:    SpO2: 93% 100%    Last Pain:  Vitals:   03/15/21 2328  TempSrc:   PainSc: 0-No pain                 Catalina Gravel
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
## 2021-03-16 NOTE — ED Notes (Signed)
Pt transported to IR at this time.

## 2021-03-16 NOTE — ED Notes (Signed)
Patient transported to CT 

## 2021-03-16 NOTE — Progress Notes (Addendum)
NAME:  Andre [name], MRN:  [id number], DOB:  03-03-1951, LOS: 0 ADMISSION DATE:  03/15/2021, CONSULTATION DATE:  11/12 REFERRING MD:  Lorrin Goodell , CHIEF COMPLAINT:  right sided weakness   History of Present Illness:  Patient is encephalopathic and/or intubated. Therefore history has been obtained from chart review.   Andre [name], is a 70 y.o. male, who presented to the [facility] ED with a chief complaint of right sided weakness   They have a pertinent past medical history of HTN, ETOH use, stroke with R sided weakness, active smoker  LKW was 1800 on 11/11 after being seen by roommate. When roommate returned at 2000, the patient was altered and weak on the right. EMS was called.   ED course was notable for worsening R sided weakness, left gaze preference with dysarthric speech and speech apraxia. NIHSS score 9. ETOH level 16. Lactate 2.3. UDS + for cocaine. Patient was out of IV thrombolytic window. CTA was concerning for acute occlusion/LVO of the mid basilar artery with distal reconstitution.    The decision was made to go to [facility] with Dr. Estanislado Pandy. Complete revascularization of the occluded basilar artery was achieved (TICI 3)  PCCM was consulted for assistance with mechanical ventilation.    Pertinent  Medical History  HTN, ETOH use, stroke with R sided weakness, active smoker  Significant Hospital Events: Including procedures, antibiotic start and stop dates in addition to other pertinent events   11/11 Presented , To NIR for bilateral vertebral artery angiograms followed by complete  revascularization of occluded basilar artery just distal to AICA origins withx1 pass with 52mm x 40 mm solitaireX retriever and contact aspiration achieving a TICI 3 revascularization .  Uncovered severe ICAD stenosis treated with stent assisted angioplasty with TICI 3 revascularization.  Interim History / Subjective:  On Propofol 25 mcg/kg>> Decreased to 12 Pt. Is awake and follows some  commands but not others.Will stick out tongue and squeeze hand, but unable to lift head from pillow.  ? If there is a conative aphasia He is tolerating PS 8/5, rate is currently 19, but have seen as high as 31, volumes are ok. Off Cleviprex and Cangrelor Had follow up imaging 11/12 early am  shows acute infarct likely in the brainstem and right occipital cortex. No acute hemorrhage.  Na 134/ K 3.5/ CO2 21/ WBC 5.8/ HGB 12/ Platelets 183 T max 100 Net + 2195, 800 cc urine output since admission     Objective   Blood pressure 100/64, pulse 81, temperature 100 F (37.8 C), temperature source Axillary, resp. rate (!) 21, height 6' (1.829 m), weight 72.6 kg, SpO2 100 %.    Vent Mode: PSV;CPAP FiO2 (%):  [40 %-100 %] 40 % Set Rate:  [16 bmp-18 bmp] 18 bmp Vt Set:  [620 mL] 620 mL PEEP:  [5 cmH20] 5 cmH20 Pressure Support:  [8 cmH20] 8 cmH20 Plateau Pressure:  [19 cmH20] 19 cmH20   Intake/Output Summary (Last 24 hours) at 03/16/2021 0917 Last data filed at 03/16/2021 0700 Gross per 24 hour  Intake 3175.35 ml  Output 980 ml  Net 2195.35 ml   Filed Weights   03/15/21 2136  Weight: 72.6 kg    Examination: General: In bed, NAD, appears comfortable HEENT: MM pink/moist, anicteric, atraumatic Neuro: RASS -4, PERRL 5mm, sedated CV: S1S2, NSR, no m/r/g appreciated PULM:  clear in the upper lobes, clear in the lower lobes, trachea midline, chest expansion symmetric GI: soft, bsx4 active, non-tender   Extremities: warm/dry,  no pretibial edema, capillary refill less than 3 seconds  Skin: no rashes or lesions noted  CXR: stable tube, no pneumo, effusion, infiltrate Resolved Hospital Problem list     Assessment & Plan:  Acute basilar occlusion s/p NIR (TICI 3) HX stroke with residual R sided weakness LKW: 1800 11/11. Not a TPA candidate. NIR with Dr. Estanislado Pandy on 11/12 P -Management and work up per neurology/NIR/primary -ASA 81 and 180mg  brilenta. -IV cangrelor completed  at  [phone]. CT head immediately s/p infusion cessation.>> results above -Goal blood pressure 120-140. Cleviprex ordered. titrate medicine to goal blood pressure -Statin per primary -Follow up ECHO -Follow up LDL panel -Follow up ultrasounds -Follow up MRI when able -Continue neuro watch. Wean propofol once cleviprex started.  -Continue neuroprotective measures: normothermia, euglycemia, HOB greater than 30 when able post procedure, head in neutral alignment, normocapnia, normoxia.  -Trend CBC and BMET -Aspirations precautions   Acute respiratory failure with hypoxia Lactic acidosis Suspect secondary to stroke. 7.4/39.9/80/24.7. Lactate 2.8 P -LTVV strategy with tidal volumes of 4-8 cc/kg ideal body weight -Goal plateau pressures less than 30 and driving pressures less than 15 -Wean PEEP/FiO2 for SpO2 92-98% -VAP bundle -Daily SAT and SBT -PAD bundle with Propofol gtt and fentanyl push -Follow intermittent CXR and ABG PRN -Obtain CXR now and ABG 1 hour post arrival to ICU - sedation decreased , will re-assess for extubation once sedation is off  HX HTN -Cleviprex off   -Will initiate oral anti HTN once BP dictates, currently BP is soft  AKI Hypokalemia Creatinine 1.3, baseline 1.1-1.2 -Ensure renal perfusion.  -Goal MAP 65 or greater. -Avoid neprotoxic drugs as possible. -Strict I&O's -Follow up AM creatinine - Trend BMET daily - Replete electrolytes as needed   Alcohol use disorder ETOH level 16 on admission -on propofol currently -CIWA ordered - If withdrawal while in ICU can use Precedex  Active Smoker Hx substance abuse UDS + 11/11 for cocaine -Smoking and cocaine cessation education when appropriate.   Stable post procedure. Tolerating pressure support. Will decrease sedation and evaluate for extubation in a few hours  Best Practice (right click and "Reselect all SmartList Selections" daily)   Diet/type: NPO w/ meds via tube DVT prophylaxis: LMWH GI  prophylaxis: PPI Lines: N/A Foley:  N/A Code Status:  full code Last date of multidisciplinary goals of care discussion [Per primary]   Critical care time: 32 minutes  Magdalen Spatz, MSN, AGACNP-BC [place] for personal pager PCCM on call pager [phone]  03/16/2021 , 9:17 AM  If no response, please call [phone] After hours, please call Elink at [phone]  Attending attestation:  I have taken an interval history, reviewed the chart and examined the patient. I agree with the Advanced Practitioner's note, impression, and recommendations as outlined.   Briefly, 70 year old male with hx cocaine and alcohol use admitted for brainstem and right occipital cortex infarct secondary to basilar artery occlusion s/p revascularization 03/15/21.    Awake and alert, follow commands  Blood pressure 118/64, pulse 86, temperature 99.6 F (37.6 C), resp. rate (!) 25, height 6' (1.829 m), weight 72.6 kg, SpO2 97 %. On exam, thin male on mechanical ventilation. Awake and alert and following commands. Lungs CTAB, no wheezing. Heart RRR, no murmur. Extremities no edema, no tenderness.  CT head 11/12 - Acute infarct in brainstem and right occipital cortex CTA head/neck 11/12 - Acute occlusion of mid basilar A, bilateral 80% carotid bifurcations, moderate stenosis at the  supraclinoid right ICA, severe stenosis at the supraclinoid left ICA  Assessment/Plan Right occipital cortex and brainstem stroke secondary to basilar artery occlusion Acute hypoxemic respiratory failure - Passed SBT. Extubated this am to [place]. Stable in PM. AKI - resolved Cocaine use   Pulmonary will sign off. Please call team for any concerns or questions  The patient is critically ill with multiple organ systems failure and requires high complexity decision making for assessment and support, frequent evaluation and titration of therapies, application of advanced monitoring  technologies and extensive interpretation of multiple databases.  Independent Critical Care Time: 50 Minutes.   Rodman Pickle, M.D. [facility] Pulmonary/Critical Care Medicine 03/16/2021 12:04 PM   Please see Amion for pager number to reach on-call Pulmonary and Critical Care Team.

## 2021-03-16 NOTE — ED Notes (Signed)
Advised pupils at this time are equal round and reactive brisk at a size 2, facial droop to lt side, weakness noted on rt side however improving and getting stronger

## 2021-03-16 NOTE — Procedures (Addendum)
S/P bilateral vertebral artery angiograms followed by complete  revascularization of occluded basilar artery just distal to AICA origins withx1 pass with 74mm x 40 mm solitaireX retriever and contact aspiration achieving a TICI 3 revascularization .  Uncovered severe ICAD stenosis treated with stent assisted angioplasty with TICI 3 revascularization. POst CT No ICH  or hyocehalus. RT CFA hemostasis with 57F exoseal device. Distal pulses dopplerable RT DP  and PT limited PT dopplerable. LT DP not dopplerable unchanged.  Patient left intubated due to medical condition for airway protection. Also high grade stenosis of RT ICA prox. Meds . Aspirin 81 mg and 180 mg  brilinta via OG tube. Cangrelor IV  bolus just prior to placing basilar stent. 4 hr infusion of IV cangrelor to end at 635 am.  CT brain immediately after stopping IV cangrelor. Please let neurohospitalist know. S.Bobbyjo Marulanda MD

## 2021-03-16 NOTE — Sedation Documentation (Signed)
Sheath removed at 7014, pressure being held at right groin site.

## 2021-03-16 NOTE — ED Notes (Signed)
Decision made for IR intervention at this time

## 2021-03-16 NOTE — Progress Notes (Signed)
Transported to CT and back to room 4N16 with no issues

## 2021-03-16 NOTE — ED Notes (Signed)
Verbal report given Marti Sleigh RN in [employer]

## 2021-03-16 NOTE — Plan of Care (Deleted)
RN at the bedside. Pt still intubated but coughing and gaging, ready to be extubated.   Neuro - intubated off sedation, eyes closed but able to open on request, following most simple commands. With eye opening, eyes in mid position, blinking to visual threat, able to have bilateral gaze, no obvious nystagmus, PERRL. Corneal reflex present, gag and cough present. Breathing over the vent.  Facial symmetry not able to test due to ET tube.  Tongue protrusion not cooperative due to severe gaging on vent. LUE no drift and LLE at least 3+/5. However, RUE not able to against gravity and RLE 3-/5.  Sensation, coordination not tested due to discomfort on vent with constant gaging and gait not tested.

## 2021-03-16 NOTE — Evaluation (Signed)
SLP Cancellation Note  Patient Details Name: Andre [name] MRN: [id number] DOB: 06-20-1950   Cancelled treatment:       Reason Eval/Treat Not Completed: Other (comment) (pt currently sedated and on the ventilator  s/p IR thrombectomy of a basilar ischemic stroke, will continue efforts)   Macario Golds 03/16/2021, 8:49 AM   Kathleen Lime, MS [place] Office [phone] Pager [phone]

## 2021-03-16 NOTE — Progress Notes (Signed)
eLink Physician-Brief Progress Note Patient Name: JAWANN URBANI DOB: [birth year] MRN: [id number]   Date of Service  03/16/2021  HPI/Events of Note  Patient admitted to the ICU intubated, sedated and on the ventilator  s/p IR thrombectomy of a basilar ischemic stroke.  eICU Interventions  New Patient Evaluation.        Kerry Kass Geovannie Vilar 03/16/2021, 4:23 AM

## 2021-03-16 NOTE — Progress Notes (Signed)
Brief Progress Note Pt. Is alert, off all sedation and following commands . He has a strong cough Tolerating PS of 8/5 on 40% Dr. Loanne Drilling and I assessed patient in conjunction with neuro. Orders placed for extubation . Sedation orders have been discontinued If needs pharmacologic intervention for DT's while in ICU consider Precedex  Magdalen Spatz, MSN, AGACNP-BC [place] for personal pager PCCM on call pager [phone]   03/16/2021 11:17 AM

## 2021-03-16 NOTE — ED Notes (Signed)
Contacted roommate and advised them that I needed to speak with pt next of kin now that this was an emergency situation. Advised that they didn't have a phone number or an address for the next of kin and couldn't get in touch with anyone until tomorrow. Then advised to call back in 10 min they were going to attempt to reach someone. Provider made aware

## 2021-03-16 NOTE — ED Notes (Signed)
Advised code stroke called on this pt

## 2021-03-16 NOTE — Progress Notes (Signed)
Pt K+ 3.5 with creat 1.13 and GFR > 60. ELink CCM Electrolyte protocol initiated.

## 2021-03-16 NOTE — Code Documentation (Signed)
Stroke Response Nurse Documentation Code Documentation  CRISTIAN DAVITT is a 70 y.o. male arriving to [facility] ED via [facility] on 11/11 with past medical hx of HTN, ETOH and previous stroke with residual right hemiplegia. Mr. Wolford reportedly walks with a cane. On aspirin 81 mg daily and clopidogrel 75 mg daily. Code stroke was activated by ED.   [facility] where he was LKW at 1800 and now complaining of increased right sided weakness, facial droop and left gaze preference.   Stroke team at the bedside on patient arrival. Labs drawn and patient cleared for CT by H. Tyhee PA. Patient to CT with team. NIHSS 9, see documentation for details and code stroke times. Patient with left gaze preference , right hemianopia, left facial droop, right arm weakness, right leg weakness, right decreased sensation, and dysarthria  on exam. The following imaging was completed:  CT, CTA head and neck. Patient is not a candidate for IV Thrombolytic due to Outside of therapeutic window. Patient is a candidate for IR due to basilar artery occlusion.   Care/Plan: To IR for cerebral angiogram and possible mechanical thrombectomy.   Bedside handoff with ED RN Vaughan Basta.    Madelynn Done  Rapid Response RN
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
## 2021-03-16 NOTE — ED Notes (Signed)
Advised cousin names is quick and phone number [phone]. Attempted to reach this person but no answer at this time

## 2021-03-16 NOTE — Anesthesia Preprocedure Evaluation (Addendum)
Anesthesia Evaluation  Patient identified by MRN, date of birth, ID band Patient confused    Reviewed: Allergy & Precautions, NPO status , Patient's Chart, lab work & pertinent test results, Unable to perform ROS - Chart review onlyPreop documentation limited or incomplete due to emergent nature of procedure.  Airway Mallampati: II  TM Distance: >3 FB Neck ROM: Full    Dental  (+) Dental Advisory Given, Missing, Poor Dentition   Pulmonary Current Smoker,     + decreased breath sounds+ wheezing      Cardiovascular hypertension, Normal cardiovascular exam Rhythm:Regular Rate:Normal     Neuro/Psych CVA, Residual Symptoms    GI/Hepatic negative GI ROS, (+)     substance abuse  alcohol use and cocaine use,   Endo/Other  negative endocrine ROS  Renal/GU Renal InsufficiencyRenal disease     Musculoskeletal negative musculoskeletal ROS (+)   Abdominal   Peds  Hematology  (+) Blood dyscrasia (Plavix), ,   Anesthesia Other Findings Day of surgery medications reviewed with the patient.  Reproductive/Obstetrics                            Anesthesia Physical Anesthesia Plan  ASA: 3 and emergent  Anesthesia Plan: General   Post-op Pain Management:    Induction: Intravenous and Rapid sequence  PONV Risk Score and Plan: 1 and Treatment may vary due to age or medical condition  Airway Management Planned: Oral ETT  Additional Equipment: Arterial line  Intra-op Plan:   Post-operative Plan: Possible Post-op intubation/ventilation  Informed Consent: I have reviewed the patients History and Physical, chart, labs and discussed the procedure including the risks, benefits and alternatives for the proposed anesthesia with the patient or authorized representative who has indicated his/her understanding and acceptance.     Only emergency history available  Plan Discussed with: CRNA  Anesthesia Plan  Comments:         Anesthesia Quick Evaluation

## 2021-03-16 NOTE — Anesthesia Procedure Notes (Signed)
Procedure Name: Intubation Date/Time: 03/16/2021 12:58 AM Performed by: Valetta Fuller, CRNA Pre-anesthesia Checklist: Patient identified, Emergency Drugs available, Suction available and Patient being monitored Patient Re-evaluated:Patient Re-evaluated prior to induction Oxygen Delivery Method: Circle system utilized Preoxygenation: Pre-oxygenation with 100% oxygen Induction Type: IV induction and Rapid sequence Laryngoscope Size: Miller and 2 Grade View: Grade I Tube type: Oral Tube size: 7.5 mm Number of attempts: 2 Airway Equipment and Method: Stylet Placement Confirmation: ETT inserted through vocal cords under direct vision, positive ETCO2 and breath sounds checked- equal and bilateral Secured at: 23 cm Tube secured with: Tape Dental Injury: Teeth and Oropharynx as per pre-operative assessment
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
## 2021-03-16 NOTE — Progress Notes (Addendum)
STROKE TEAM PROGRESS NOTE   INTERVAL HISTORY Patient is seen in his room with his RN at bedside.  He is being prepared for extubation and appears somewhat restless.  He was admitted last night after being found at home with AMS, speech apraxia and right sided weakness.  Toxicology screens were positive for cocaine and ETOH.  He was found to have occlusion of his basilar artery and was taken to IR for angiogram and thrombectomy.  TICI 3 revascularization was achieved.  Repeat CT this AM shows an acute infarct in the brainstem and right occipital cortex with no acute hemorrhage.  Vitals:   03/16/21 0700 03/16/21 0800 03/16/21 0811 03/16/21 1136  BP: 96/80 100/64    Pulse: 84 87 81   Resp: (!) 27 (!) 24 (!) 21   Temp:  100 F (37.8 C)    TempSrc:  Axillary    SpO2: 98% 98% 100% 100%  Weight:      Height:       CBC:  Recent Labs  Lab 03/15/21 2145 03/15/21 2237 03/16/21 0444 03/16/21 0453  WBC 7.1  --   --  5.8  NEUTROABS 5.9  --   --  4.9  HGB 13.9   < > 12.9* 12.0*  HCT 44.1   < > 38.0* 38.0*  MCV 94.6  --   --  94.1  PLT 203  --   --  183   < > = values in this interval not displayed.   Basic Metabolic Panel:  Recent Labs  Lab 03/15/21 2145 03/15/21 2237 03/16/21 0015 03/16/21 0444 03/16/21 0453  NA 137   < > 141 140 139  K 4.1   < > 4.0 3.6 3.5  CL 105  --  104  --  110  CO2 23  --   --   --  21*  GLUCOSE 82  --  69*  --  112*  BUN 15  --  19  --  12  CREATININE 1.30*  --  1.40*  --  1.13  CALCIUM 8.8*  --   --   --  8.1*   < > = values in this interval not displayed.   Lipid Panel:  Recent Labs  Lab 03/16/21 0453  CHOL 135  TRIG 49  HDL 71  CHOLHDL 1.9  VLDL 10  LDLCALC 54   HgbA1c:  Recent Labs  Lab 03/16/21 0453  HGBA1C 4.6*   Urine Drug Screen:  Recent Labs  Lab 03/15/21 2133  LABOPIA NONE DETECTED  COCAINSCRNUR POSITIVE*  LABBENZ NONE DETECTED  AMPHETMU NONE DETECTED  THCU NONE DETECTED  LABBARB NONE DETECTED    Alcohol Level  Recent  Labs  Lab 03/15/21 2145  ETH 16*    IMAGING past 24 hours CT HEAD WO CONTRAST (5MM)  Result Date: 03/16/2021 CLINICAL DATA:  Stroke follow-up.  Basilar stent placement. EXAM: CT HEAD WITHOUT CONTRAST TECHNIQUE: Contiguous axial images were obtained from the base of the skull through the vertex without intravenous contrast. COMPARISON:  CT and CTA from earlier the same day FINDINGS: Brain: Acute infarct is likely present in the left paramedian brainstem and to a small degree in the right occipital cortex. Remote left inferior cerebellar infarction. Chronic lacunar infarcts. No hemorrhage, hydrocephalus, or collection. Vascular: Atheromatous calcification and mid basilar stent. Skull: Negative Sinuses/Orbits: Negative IMPRESSION: Acute infarct likely in the brainstem and right occipital cortex. No acute hemorrhage. Electronically Signed   By: Jorje Guild M.D.   On: 03/16/2021  06:58   CT HEAD WO CONTRAST  Result Date: 03/15/2021 CLINICAL DATA:  Mental status change, neck trauma. EXAM: CT HEAD WITHOUT CONTRAST CT CERVICAL SPINE WITHOUT CONTRAST TECHNIQUE: Multidetector CT imaging of the head and cervical spine was performed following the standard protocol without intravenous contrast. Multiplanar CT image reconstructions of the cervical spine were also generated. COMPARISON:  CT head and cervical spine 08/30/2017 FINDINGS: CT HEAD FINDINGS Brain: Chronic left cerebellar and left basal ganglia infarction. No evidence of large-territorial acute infarction. No parenchymal hemorrhage. No mass lesion. No extra-axial collection. No mass effect or midline shift. No hydrocephalus. Basilar cisterns are patent. Vascular: No hyperdense vessel. Atherosclerotic calcifications are present within the cavernous internal carotid arteries. Skull: No acute fracture or focal lesion. Sinuses/Orbits: Paranasal sinuses and mastoid air cells are clear. The orbits are unremarkable. Other: None. CT CERVICAL SPINE FINDINGS  Alignment: Stable grade 1 anterolisthesis of C2 on C3 and C7 on T1. Skull base and vertebrae: Multilevel severe degenerative changes of the spine. Associated multilevel severe neural foraminal stenosis. No severe osseous central canal stenosis. No acute fracture. No aggressive appearing focal osseous lesion or focal pathologic process. Soft tissues and spinal canal: No prevertebral fluid or swelling. No visible canal hematoma. Upper chest: Centrilobular emphysematous changes. Other: None. IMPRESSION: 1. No acute intracranial abnormality. 2. No acute displaced fracture or traumatic listhesis of the cervical spine in a patient with multilevel degenerative change of the spine leading to severe osseous neural foraminal stenosis. 3.  Emphysema (ICD10-J43.9). Electronically Signed   By: Iven Finn M.D.   On: 03/15/2021 22:35   CT CERVICAL SPINE WO CONTRAST  Result Date: 03/15/2021 CLINICAL DATA:  Mental status change, neck trauma. EXAM: CT HEAD WITHOUT CONTRAST CT CERVICAL SPINE WITHOUT CONTRAST TECHNIQUE: Multidetector CT imaging of the head and cervical spine was performed following the standard protocol without intravenous contrast. Multiplanar CT image reconstructions of the cervical spine were also generated. COMPARISON:  CT head and cervical spine 08/30/2017 FINDINGS: CT HEAD FINDINGS Brain: Chronic left cerebellar and left basal ganglia infarction. No evidence of large-territorial acute infarction. No parenchymal hemorrhage. No mass lesion. No extra-axial collection. No mass effect or midline shift. No hydrocephalus. Basilar cisterns are patent. Vascular: No hyperdense vessel. Atherosclerotic calcifications are present within the cavernous internal carotid arteries. Skull: No acute fracture or focal lesion. Sinuses/Orbits: Paranasal sinuses and mastoid air cells are clear. The orbits are unremarkable. Other: None. CT CERVICAL SPINE FINDINGS Alignment: Stable grade 1 anterolisthesis of C2 on C3 and C7 on  T1. Skull base and vertebrae: Multilevel severe degenerative changes of the spine. Associated multilevel severe neural foraminal stenosis. No severe osseous central canal stenosis. No acute fracture. No aggressive appearing focal osseous lesion or focal pathologic process. Soft tissues and spinal canal: No prevertebral fluid or swelling. No visible canal hematoma. Upper chest: Centrilobular emphysematous changes. Other: None. IMPRESSION: 1. No acute intracranial abnormality. 2. No acute displaced fracture or traumatic listhesis of the cervical spine in a patient with multilevel degenerative change of the spine leading to severe osseous neural foraminal stenosis. 3.  Emphysema (ICD10-J43.9). Electronically Signed   By: Iven Finn M.D.   On: 03/15/2021 22:35   DG CHEST PORT 1 VIEW  Result Date: 03/16/2021 CLINICAL DATA:  Intubation an orogastric tube placement. EXAM: PORTABLE CHEST 1 VIEW COMPARISON:  02/12/2021. FINDINGS: The heart size and mediastinal contours are within normal limits. Both lungs are clear. Old healed rib fractures are noted bilaterally. An enteric tube courses over the left upper quadrant  and out of the field of view. The side port projects over the anticipated region of the stomach. The endotracheal tube terminates 3.3 cm above the carina. IMPRESSION: 1. No acute cardiopulmonary process. 2. Medical devices as described above. Electronically Signed   By: Brett Fairy M.D.   On: 03/16/2021 04:29   DG Chest Port 1 View  Result Date: 03/15/2021 CLINICAL DATA:  Question aspiration. Found on ground at side of apartment. Gurgling respirations. EXAM: PORTABLE CHEST 1 VIEW COMPARISON:  Radiographs 10/29/2020 FINDINGS: The cardiomediastinal contours are normal. The lungs are clear. Pulmonary vasculature is normal. No consolidation, pleural effusion, or pneumothorax. Right posterior rib fractures are remote, they were acute on prior exam. There also remote left rib fractures. No acute osseous  abnormalities are seen. IMPRESSION: 1. No acute chest findings. No radiographic findings to suggest aspiration. 2. Remote bilateral rib fractures. Electronically Signed   By: Keith Rake M.D.   On: 03/15/2021 22:08   CT ANGIO HEAD CODE STROKE  Result Date: 03/16/2021 CLINICAL DATA:  Initial evaluation for acute stroke. EXAM: CT ANGIOGRAPHY HEAD AND NECK TECHNIQUE: Multidetector CT imaging of the head and neck was performed using the standard protocol during bolus administration of intravenous contrast. Multiplanar CT image reconstructions and MIPs were obtained to evaluate the vascular anatomy. Carotid stenosis measurements (when applicable) are obtained utilizing NASCET criteria, using the distal internal carotid diameter as the denominator. CONTRAST:  21mL OMNIPAQUE IOHEXOL 350 MG/ML SOLN COMPARISON:  CT from 03/15/2021 as well as prior CTA from 05/04/2017. FINDINGS: CTA NECK FINDINGS Aortic arch: Partially visualized aortic arch in origin of the great vessels grossly normal in caliber. No visible stenosis about the origin of the great vessels. Right carotid system: Right CCA patent from its origin to the bifurcation without stenosis. Bulky calcified plaque at the right carotid bulb with associated stenosis of up to 80% by NASCET criteria (series 7, image 222). Right ICA patent distally without stenosis, dissection or occlusion. Left carotid system: Left CCA patent from its origin to the bifurcation without stenosis. Eccentric mixed plaque at the left carotid bulb with associated stenosis of up to 80% by NASCET criteria (series 7, image 229). Left ICA patent distally without stenosis dissection or occlusion. Vertebral arteries: Both vertebral arteries arise from the subclavian arteries. No visible proximal subclavian artery stenosis. Right vertebral artery slightly dominant. Vertebral arteries patent within the neck without stenosis dissection or occlusion. Skeleton: No discrete or worrisome osseous  lesions. Moderate to advanced multilevel spondylosis noted throughout the cervical spine. Postsurgical changes noted at the mandible. Other neck: Multiple hypodense/cystic lesions noted within the subcutaneous fat of the upper face/neck, greater on the left. The largest of these seen on the left and measures 2.8 cm in size (series 5, image 97). Few small foci of gas seen within this lesion. Additional lesion seen slightly inferiorly (series 5, image 112). Focal calcifications seen within a 1.2 cm lesion on the right (series 5, image 115). Findings are most likely benign in reflect sebaceous cysts, and are similar as compared to 2018. No other acute soft tissue abnormality within the neck. Upper chest: Emphysema. Visualized upper chest demonstrates no other acute finding. Review of the MIP images confirms the above findings CTA HEAD FINDINGS Anterior circulation: Petrous segments patent bilaterally. Atheromatous plaque within the carotid siphons bilaterally with associated moderate stenosis at the supraclinoid right ICA (series 7, image 110). Focal severe stenosis seen at the contralateral supraclinoid left ICA (series 7, image 113). Right A1 patent. Left A1 hypoplastic  and/or absent. Normal anterior communicating artery complex. Anterior cerebral arteries remain patent to their distal aspects without stenosis. Right M1 widely patent. Focal moderate distal left M1 stenosis (series 10, image 22). Normal MCA bifurcations. Distal MCA branches perfused and symmetric. Posterior circulation: Extensive atheromatous disease throughout the V4 segments bilaterally with associated moderate multifocal narrowing on the right, with moderate to severe multifocal narrowing on the left. Distal left V4 segment nearly occludes prior to the vertebrobasilar junction. Right PICA origin remains patent. Left PICA not definitely seen. There is occlusion of the mid basilar artery distally, concerning for occlusive thrombus (series 8, image  110). Perfusion of the distal basilar artery distally, likely collateral. Superior cerebellar arteries perfused and patent bilaterally. Right PCA supplied via the basilar. Multifocal atheromatous irregularity throughout the right PCA without high-grade stenosis. Fetal type origin of the left PCA which remains patent to its distal aspect. Venous sinuses: Patent allowing for timing the contrast bolus. Anatomic variants: Fetal type origin of the left PCA. No visible aneurysm. Review of the MIP images confirms the above findings IMPRESSION: 1. 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 Basilar artery is perfused distally, likely collateral in nature. Both SCAs and PCAs remain patent and perfused at this time. 2. Extensive atheromatous disease throughout the V4 segments bilaterally with associated moderate to severe multifocal narrowing, left worse than right. 3. Severe atheromatous stenoses of up to 80% at the carotid bifurcations bilaterally. 4. Moderate stenosis at the supraclinoid right ICA, with severe stenosis at the supraclinoid left ICA. 5. Moderate distal left M1 stenosis. 6. Emphysema (ICD10-J43.9). Critical Value/emergent results were called by telephone at the time of interpretation on 03/16/2021 at 12:10 a.m. to provider [facility] , who verbally acknowledged these results. Electronically Signed   By: Jeannine Boga M.D.   On: 03/16/2021 01:43   CT ANGIO NECK CODE STROKE  Result Date: 03/16/2021 CLINICAL DATA:  Initial evaluation for acute stroke. EXAM: CT ANGIOGRAPHY HEAD AND NECK TECHNIQUE: Multidetector CT imaging of the head and neck was performed using the standard protocol during bolus administration of intravenous contrast. Multiplanar CT image reconstructions and MIPs were obtained to evaluate the vascular anatomy. Carotid stenosis measurements (when applicable) are obtained utilizing NASCET criteria, using the distal internal carotid diameter as the denominator. CONTRAST:  8mL  OMNIPAQUE IOHEXOL 350 MG/ML SOLN COMPARISON:  CT from 03/15/2021 as well as prior CTA from 05/04/2017. FINDINGS: CTA NECK FINDINGS Aortic arch: Partially visualized aortic arch in origin of the great vessels grossly normal in caliber. No visible stenosis about the origin of the great vessels. Right carotid system: Right CCA patent from its origin to the bifurcation without stenosis. Bulky calcified plaque at the right carotid bulb with associated stenosis of up to 80% by NASCET criteria (series 7, image 222). Right ICA patent distally without stenosis, dissection or occlusion. Left carotid system: Left CCA patent from its origin to the bifurcation without stenosis. Eccentric mixed plaque at the left carotid bulb with associated stenosis of up to 80% by NASCET criteria (series 7, image 229). Left ICA patent distally without stenosis dissection or occlusion. Vertebral arteries: Both vertebral arteries arise from the subclavian arteries. No visible proximal subclavian artery stenosis. Right vertebral artery slightly dominant. Vertebral arteries patent within the neck without stenosis dissection or occlusion. Skeleton: No discrete or worrisome osseous lesions. Moderate to advanced multilevel spondylosis noted throughout the cervical spine. Postsurgical changes noted at the mandible. Other neck: Multiple hypodense/cystic lesions noted within the subcutaneous fat of the upper face/neck, greater  on the left. The largest of these seen on the left and measures 2.8 cm in size (series 5, image 97). Few small foci of gas seen within this lesion. Additional lesion seen slightly inferiorly (series 5, image 112). Focal calcifications seen within a 1.2 cm lesion on the right (series 5, image 115). Findings are most likely benign in reflect sebaceous cysts, and are similar as compared to 2018. No other acute soft tissue abnormality within the neck. Upper chest: Emphysema. Visualized upper chest demonstrates no other acute finding.  Review of the MIP images confirms the above findings CTA HEAD FINDINGS Anterior circulation: Petrous segments patent bilaterally. Atheromatous plaque within the carotid siphons bilaterally with associated moderate stenosis at the supraclinoid right ICA (series 7, image 110). Focal severe stenosis seen at the contralateral supraclinoid left ICA (series 7, image 113). Right A1 patent. Left A1 hypoplastic and/or absent. Normal anterior communicating artery complex. Anterior cerebral arteries remain patent to their distal aspects without stenosis. Right M1 widely patent. Focal moderate distal left M1 stenosis (series 10, image 22). Normal MCA bifurcations. Distal MCA branches perfused and symmetric. Posterior circulation: Extensive atheromatous disease throughout the V4 segments bilaterally with associated moderate multifocal narrowing on the right, with moderate to severe multifocal narrowing on the left. Distal left V4 segment nearly occludes prior to the vertebrobasilar junction. Right PICA origin remains patent. Left PICA not definitely seen. There is occlusion of the mid basilar artery distally, concerning for occlusive thrombus (series 8, image 110). Perfusion of the distal basilar artery distally, likely collateral. Superior cerebellar arteries perfused and patent bilaterally. Right PCA supplied via the basilar. Multifocal atheromatous irregularity throughout the right PCA without high-grade stenosis. Fetal type origin of the left PCA which remains patent to its distal aspect. Venous sinuses: Patent allowing for timing the contrast bolus. Anatomic variants: Fetal type origin of the left PCA. No visible aneurysm. Review of the MIP images confirms the above findings IMPRESSION: 1. 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 Basilar artery is perfused distally, likely collateral in nature. Both SCAs and PCAs remain patent and perfused at this time. 2. Extensive atheromatous disease throughout the V4 segments  bilaterally with associated moderate to severe multifocal narrowing, left worse than right. 3. Severe atheromatous stenoses of up to 80% at the carotid bifurcations bilaterally. 4. Moderate stenosis at the supraclinoid right ICA, with severe stenosis at the supraclinoid left ICA. 5. Moderate distal left M1 stenosis. 6. Emphysema (ICD10-J43.9). Critical Value/emergent results were called by telephone at the time of interpretation on 03/16/2021 at 12:10 a.m. to provider [facility] , who verbally acknowledged these results. Electronically Signed   By: Jeannine Boga M.D.   On: 03/16/2021 01:43    PHYSICAL EXAM General:  Patient is a thin appearing, well developed male in mild distress due to presence of ETT   NEURO:  Mental Status: AA&Ox3 (with choices) Speech/Language: speech cannot be assessed as patient is intubated.  He appears to comprehend spoken words, can follow simple commands and answers questions by nodding  Cough reflex present  Cranial Nerves:  II: PERRL.  III, IV, VI: EOMI.  V: UTA as patient is intubated VII: UTA as patient is intubated VIII: hearing intact to voice. IX, X: UTA as patient is intubated XII: UTA as patient is intubated Motor: 2/5 strength in RUE and RLE, 3/5 strength in LUE and LLE  Tone: is normal and bulk is normal Sensation-  UTA as patient is intubated Coordination: Difficult to assess as patient is intubated.  Drift present in RUE Gait- deferred    ASSESSMENT/PLAN Mr. JUANANTONIO STOLAR is a 70 y.o. male with history of HTN, ischemic stroke x2, tobacco abuse and cocaine abuse presenting with brainstem and right occipital cortex stroke secondary to LVO of basilar artery. Patient was taken to IR for angiogram and thrombectomy, and TICI 3 flow was achieved.  He was left intubated after the procedure for airway protection and will be extubated this morning.  Repeat CT head demonstrates an acute infarct in the brainstem and right occipital cortex  with no acute hemorrhage.  MRI to be obtained post extubation.  CIWA protocol will be followed post-extubation as well.  Stroke: b/l pontine and right PCA scattered infarct secondary to progressive large vessel disease with persistent cocaine abuse, smoker and alcohol abuse Code Stroke CT head No acute abnormality.    CTA head & neck Acute occlusion of mid basilar artery, atheromatous disease in V4 segments with multfocal narrowing, left worse than right, 80% atheromatous stenoses at carotid bifurcations, moderate stenosis of supraclinoid right ICA, severe stenosis of supraclinoid left ICA, moderate distal left M1 stenosis Post IR CT acute infarct in brainstem and right occipital cortex MRI pending MRA pending 2D Echo EF 50-55% LDL 54 HgbA1c 4.6 UDS positive for cocaine VTE prophylaxis - Lovenox aspirin 81 mg daily and clopidogrel 75 mg daily prior to admission, now on aspirin 81 mg daily and Brilinta (ticagrelor) 90 mg bid.  Therapy recommendations:  pending Disposition:  pending  History of stroke 05/2015, MRI showed right pontine infarct, remote right BG and cerebellum infarct.  CTA head and neck showed right ICA 60% stenosis, left ICA 50% stenosis, bilateral ICA siphon, VA and BA stenosis.  EF 50 to 55%, LDL 52, UDS positive for cocaine.  Discharged with DAPT and Zocor 04/2017 admitted for right-sided weakness, slurred speech and right facial droop.  MRI showed left pontine infarct.  CT head and neck bilateral ICA bulb 30 to 40% stenosis.  Severe left siphon stenosis, basilar artery stenosis.  EF 60 to 65%.  A1c 4.6, LDL 45, UDS positive for cocaine.  Discharged with DAPT and Lipitor 80.  Progressive intracranial vascular stenosis 05/2015, CT head and neck showed bilateral VAs and BA stenosis, however BA patent 04/2017 CTA head and neck showed similar bilateral VAs and BA stenosis but patent Current admission CTA head and neck showed progressive worsening bilateral V4 stenosis and  atherosclerosis.  BA occlusion.  Hypertension Home meds:  none BP goal 120-140 within 24h of IR On cleviprex as needed by cuff pressure Long-term BP goal normotensive  Hyperlipidemia Home meds:  atorvastatin 80 mg daily, resumed in hospital LDL 54, goal < 70 High intensity statin continued Continue statin at discharge  Cocaine abuse UDS positive for cocaine, patient also used cocaine prior to previous strokes. Social work consult for substance abuse cessation assistance Cessation education will be provided  Tobacco abuse Current smoker Smoking cessation counseling will be provided  Alcohol abuse On CIWA protocol FA/MVI/B1 Education on alcohol Limited use will be provided ETOH use, alcohol level 16, will advise to drink no more than 2 drink(s) a day  Other Stroke Risk Factors Advanced Age >/= 84   Other Active Problems    Hospital day # [place], NP   ATTENDING NOTE: I reviewed above note and agree with the assessment and plan. Pt was seen and examined.   70 year old male with history of hypertension, alcohol abuse, smoker, substance abuse, history of stroke,  admitted for unresponsiveness, right-sided weakness, left gaze, slurred speech and left facial droop after alcohol abuse.  CT no acute abnormality, CT head and neck mid basilar artery occlusion.  Status post IR with TICI3 and basilar artery stenting.  MRI and MRA pending.  EF 50 to 55%, A1c 4.6, LDL 54, UDS showed cocaine positive.  Creatinine 1.13.  In 05/2015 patient had right pontine infarct on MRI.  CT head and neck showed right ICA 60%, left ICA 50% stenosis.  Bilateral siphon, VA and basilar artery stenosis.  EF 50 to 55%, LDL 52, UDS positive for cocaine, discharged on DAPT and Zocor.  In 04/2017 patient admitted for right-sided weakness, slurred speech and right facial droop.  MRI showed left pontine infarct.  CTA head and neck showed bilateral ICA 30 to 40% stenosis, basilar artery stenosis,  left ICA siphon severe stenosis.  EF 60 to 65%, A1c 4.6, LDL 45, UDS again showed cocaine positive.  Discharged on DAPT and Lipitor 80.  On exam today, patient intubated off sedation, eyes closed but able to open on request, following most simple commands. With eye opening, eyes in mid position, blinking to visual threat, able to have bilateral gaze, no obvious nystagmus, PERRL. Corneal reflex present, gag and cough present. Breathing over the vent.  Facial symmetry not able to test due to ET tube.  Tongue protrusion not cooperative due to severe gaging on vent. LUE no drift and LLE at least 3+/5. However, RUE not able to against gravity and RLE 3-/5.  Sensation, coordination not tested due to discomfort on vent with constant gaging and gait not tested.  Etiology for patient stroke likely due to progressive vascular stenosis/occlusion at vertebral artery and basilar artery.  Persistent cocaine use, alcohol abuse, smoking likely contributing factors.  Recommend extubation as able per CCM.  DC A-line, BP goal 120-140 within 24 hours of IR by cuff pressure.  Continue aspirin and Brilinta post stenting.  CIWA protocol, continue thiamine, folic acid and multivitamin.  Speech evaluation after extubation.  PT/OT pending.  Education on cocaine, tobacco, alcohol abuse will be provided once able.  For detailed assessment and plan, please refer to above as I have made changes wherever appropriate.   Rosalin Hawking, MD PhD Stroke Neurology 03/16/2021 7:44 PM  This patient is critically ill due to brainstem infarct, basilar artery occlusion status post IR, cocaine abuse, respiratory failure and at significant risk of neurological worsening, death form recurrent stroke, hemorrhagic conversion, seizure, respiratory failure. This patient's care requires constant monitoring of vital signs, hemodynamics, respiratory and cardiac monitoring, review of multiple databases, neurological assessment, discussion with family, other  specialists and medical decision making of high complexity. I spent 40 minutes of neurocritical care time in the care of this patient.  I discussed with Dr. Loanne Drilling CCM.    To contact Stroke Continuity provider, please refer to [email or web address] After hours, contact General Neurology
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
## 2021-03-16 NOTE — Anesthesia Procedure Notes (Signed)
Arterial Line Insertion Start/End11/04/2021 12:59 AM, 03/16/2021 1:04 AM Performed by: Catalina Gravel, MD, anesthesiologist  Patient location: OR. Preanesthetic checklist: patient identified, IV checked, site marked, risks and benefits discussed, surgical consent, monitors and equipment checked, pre-op evaluation, timeout performed and anesthesia consent Lidocaine 1% used for infiltration Left, radial was placed Catheter size: 20 G Hand hygiene performed  and maximum sterile barriers used   Attempts: 1 Procedure performed without using ultrasound guided technique. Following insertion, dressing applied and Biopatch. Post procedure assessment: normal and unchanged  Patient tolerated the procedure well with no immediate complications.

## 2021-03-16 NOTE — Transfer of Care (Signed)
Immediate Anesthesia Transfer of Care Note  Patient: LEAF KERNODLE  Procedure(s) Performed: IR WITH ANESTHESIA  Patient Location: NICU  Anesthesia Type:General  Level of Consciousness: Patient remains intubated per anesthesia plan  Airway & Oxygen Therapy: Patient placed on Ventilator (see vital sign flow sheet for setting)  Post-op Assessment: Report given to RN and Post -op Vital signs reviewed and stable  Post vital signs: Reviewed and stable  Last Vitals:  Vitals Value Taken Time  BP 132/72   Temp    Pulse 63 03/16/21 0407  Resp    SpO2 100 % 03/16/21 0407  Vitals shown include unvalidated device data.  Last Pain:  Vitals:   03/15/21 2328  TempSrc:   PainSc: 0-No pain         Complications: No notable events documented.

## 2021-03-16 NOTE — Sedation Documentation (Signed)
Patient arrived to 4N at 23 and handoff report was given to Kyung Rudd, RN at bedside.  Patient's right femoral groin site was closed with a 7 fr exoseal at 0318 but had bleeding so pressure was held for 30 minutes. Patient has a quick clot, gauze, and tegederm at site.  Site remains level 0 with dressing clean/dry/intact. Pulses intact.

## 2021-03-16 NOTE — ED Notes (Signed)
Arrived to IR at this time

## 2021-03-17 ENCOUNTER — Inpatient Hospital Stay (HOSPITAL_COMMUNITY): Payer: Medicare Other

## 2021-03-17 DIAGNOSIS — J9601 Acute respiratory failure with hypoxia: Secondary | ICD-10-CM

## 2021-03-17 DIAGNOSIS — I6322 Cerebral infarction due to unspecified occlusion or stenosis of basilar arteries: Secondary | ICD-10-CM | POA: Diagnosis not present

## 2021-03-17 DIAGNOSIS — R4182 Altered mental status, unspecified: Secondary | ICD-10-CM

## 2021-03-17 LAB — COMPREHENSIVE METABOLIC PANEL
ALT: 11 U/L (ref 0–44)
AST: 32 U/L (ref 15–41)
Albumin: 2.8 g/dL — ABNORMAL LOW (ref 3.5–5.0)
Alkaline Phosphatase: 42 U/L (ref 38–126)
Anion gap: 6 (ref 5–15)
BUN: 9 mg/dL (ref 8–23)
CO2: 26 mmol/L (ref 22–32)
Calcium: 8.5 mg/dL — ABNORMAL LOW (ref 8.9–10.3)
Chloride: 107 mmol/L (ref 98–111)
Creatinine, Ser: 1.16 mg/dL (ref 0.61–1.24)
GFR, Estimated: >60 mL/min (ref >60)
Glucose, Bld: 90 mg/dL (ref 70–99)
Potassium: 4 mmol/L (ref 3.5–5.1)
Sodium: 139 mmol/L (ref 135–145)
Total Bilirubin: 1.3 mg/dL — ABNORMAL HIGH (ref 0.3–1.2)
Total Protein: 5.5 g/dL — ABNORMAL LOW (ref 6.5–8.1)

## 2021-03-17 LAB — CBC WITH DIFFERENTIAL/PLATELET
Abs Immature Granulocytes: 0.02 10*3/uL (ref 0.00–0.07)
Basophils Absolute: 0 10*3/uL (ref 0.0–0.1)
Basophils Relative: 0 %
Eosinophils Absolute: 0 10*3/uL (ref 0.0–0.5)
Eosinophils Relative: 0 %
HCT: 38.9 % — ABNORMAL LOW (ref 39.0–52.0)
Hemoglobin: 12.1 g/dL — ABNORMAL LOW (ref 13.0–17.0)
Immature Granulocytes: 0 %
Lymphocytes Relative: 16 %
Lymphs Abs: 1.4 10*3/uL (ref 0.7–4.0)
MCH: 29.7 pg (ref 26.0–34.0)
MCHC: 31.1 g/dL (ref 30.0–36.0)
MCV: 95.3 fL (ref 80.0–100.0)
Monocytes Absolute: 0.9 10*3/uL (ref 0.1–1.0)
Monocytes Relative: 11 %
Neutro Abs: 6.1 10*3/uL (ref 1.7–7.7)
Neutrophils Relative %: 73 %
Platelets: 171 10*3/uL (ref 150–400)
RBC: 4.08 MIL/uL — ABNORMAL LOW (ref 4.22–5.81)
RDW: 13.5 % (ref 11.5–15.5)
WBC: 8.4 10*3/uL (ref 4.0–10.5)
nRBC: 0 % (ref 0.0–0.2)

## 2021-03-17 MED ORDER — LABETALOL HCL 5 MG/ML IV SOLN
10.0000 mg | Freq: Once | INTRAVENOUS | Status: AC
  Administered 2021-03-17: 10 mg via INTRAVENOUS

## 2021-03-17 MED ORDER — LABETALOL HCL 5 MG/ML IV SOLN
5.0000 mg | INTRAVENOUS | Status: DC | PRN
Start: 2021-03-17 — End: 2021-03-21

## 2021-03-17 MED ORDER — AMLODIPINE BESYLATE 10 MG PO TABS
10.0000 mg | ORAL_TABLET | Freq: Every day | ORAL | Status: DC
  Administered 2021-03-17 – 2021-03-20 (×4): 10 mg via ORAL
  Filled 2021-03-17 (×4): qty 1

## 2021-03-17 MED ORDER — LABETALOL HCL 5 MG/ML IV SOLN
10.0000 mg | INTRAVENOUS | Status: DC | PRN
  Administered 2021-03-17 (×2): 10 mg via INTRAVENOUS
  Filled 2021-03-17 (×3): qty 4

## 2021-03-17 NOTE — Progress Notes (Addendum)
NAME:  Andre [name], MRN:  [id number], DOB:  12/01/1950, LOS: 1 ADMISSION DATE:  03/15/2021, CONSULTATION DATE:  11/12 REFERRING MD:  Lorrin Goodell , CHIEF COMPLAINT:  right sided weakness   History of Present Illness:  Patient is encephalopathic and/or intubated. Therefore history has been obtained from chart review.   Andre [name], is a 70 y.o. male, who presented to the [facility] ED with a chief complaint of right sided weakness   They have a pertinent past medical history of HTN, ETOH use, stroke with R sided weakness, active smoker  LKW was 1800 on 11/11 after being seen by roommate. When roommate returned at 2000, the patient was altered and weak on the right. EMS was called.   ED course was notable for worsening R sided weakness, left gaze preference with dysarthric speech and speech apraxia. NIHSS score 9. ETOH level 16. Lactate 2.3. UDS + for cocaine. Patient was out of IV thrombolytic window. CTA was concerning for acute occlusion/LVO of the mid basilar artery with distal reconstitution.    The decision was made to go to [facility] with Dr. Estanislado Pandy. Complete revascularization of the occluded basilar artery was achieved (TICI 3)  PCCM was consulted for assistance with mechanical ventilation.    Pertinent  Medical History  HTN, ETOH use, stroke with R sided weakness, active smoker  Significant Hospital Events: Including procedures, antibiotic start and stop dates in addition to other pertinent events   11/11 Presented , To NIR for bilateral vertebral artery angiograms followed by complete  revascularization of occluded basilar artery just distal to AICA origins withx1 pass with 42mm x 40 mm solitaireX retriever and contact aspiration achieving a TICI 3 revascularization .  Uncovered severe ICAD stenosis treated with stent assisted angioplasty with TICI 3 revascularization. 11/12 Extubated , on and off Cleviprex 11/13 Off Cleviprex, looks great  Interim History / Subjective:    Pt. Is awake , follows commands , alert and oriented x 3. Remains on 2 L [place] with good sats, nursing working on weaning oxygen completely No further Cleviprex sine 11/12 afternoon. Did get two doses of Labetalol overnight for BP goal of 254 systolic T max 270, WBC 6.2/ HGB 12.1/Platelets 171 Net + 812.9 , 1900 urine output last 24 hours  Additional Labs Reviewed: Na 139/ K 4.0/Creatinine 1.16   MRI 11/12 IMPRESSION: MRI HEAD IMPRESSION:   1. Patchy small volume acute ischemic infarcts involving the right cerebellum, pons, right occipital lobe, and right thalamus as above. No associated hemorrhage or mass effect. 2. Underlying chronic microvascular ischemic disease with multiple remote lacunar infarcts about the hemispheric cerebral white matter, deep gray nuclei, and cerebellum.   MRA HEAD IMPRESSION:   1. Interval stenting of the basilar artery. Grossly patent flow through the stent, with good perfusion distally. 2. Moderate stenosis at the supraclinoid left ICA. 3. Additional mild intracranial atherosclerotic disease. No other hemodynamically significant stenosis by MRA.  Echo 11/12 Left ventricular ejection fraction, by estimation, is 50 to 55%. The left ventricle has low normal function. The left ventricle has no regional wall motion abnormalities. There is mild left ventricular hypertrophy. Left ventricular diastolic parameters are indeterminate. Right ventricular systolic function is normal. The right ventricular size is normal. There is normal pulmonary artery systolic pressure. The estimated right ventricular systolic pressure is 37.6 mmHg. The mitral valve is abnormal. Trivial mitral valve regurgitation. The aortic valve is tricuspid. Aortic valve regurgitation is trivial. Aortic valve sclerosis/calcification is present, without any evidence of aortic stenosis. The  inferior vena cava is normal in size with <50% respiratory variability, suggesting right atrial  pressure of 8 mmHg. Comparison(s): No prior Echocardiogram.   Objective   Blood pressure 129/71, pulse 78, temperature 99.5 F (37.5 C), temperature source Oral, resp. rate (!) 25, height 6' (1.829 m), weight 72.6 kg, SpO2 97 %.    Vent Mode: PSV;CPAP FiO2 (%):  [40 %] 40 % PEEP:  [5 cmH20] 5 cmH20 Pressure Support:  [8 cmH20] 8 cmH20   Intake/Output Summary (Last 24 hours) at 03/17/2021 0745 Last data filed at 03/17/2021 0600 Gross per 24 hour  Intake 517.59 ml  Output 1900 ml  Net -1382.41 ml   Filed Weights   03/15/21 2136  Weight: 72.6 kg    Examination: General: In bed, conversant, in NAD HEENT: MM pink/moist, anicteric, atraumatic Neuro: RASS -4, PERRL 86mm, Drift per RUE only today CV: S1S2, NSR, no m/r/g appreciated PULM:  Bilateral chest excursion, clear throughout, diminished per bases GI: soft, bsx4 active, non-tender   ND,  Body mass index is 21.71 kg/m. Extremities: warm/dry, no pretibial edema, capillary refill less than 3 seconds , no obvious deformities Skin: no rashes or lesions noted, warm dry and intact  CXR: Clear, no acute cardiopulmonary process Resolved Hospital Problem list     Assessment & Plan:  Acute basilar occlusion s/p NIR (TICI 3) HX stroke with residual R sided weakness LKW: 1800 11/11. Not a TPA candidate. NIR with Dr. Estanislado Pandy on 11/12 P - Management and work up per neurology/NIR/primary - ASA 81 and 180mg  brilenta. - IV cangrelor completed  at [phone]. CT head immediately s/p infusion cessation.>> results above - Goal blood pressure 120-140. Expect this will be liberated today to 160. - Cleviprex as needed to BP goal, but has not required since 11/12 - Labetalol prn  - Statin per primary - Echo results noted above - Follow up LDL panel - Follow up ultrasounds - Follow up MRI when able - Continue neuro watch. Wean propofol once cleviprex started.  - Continue neuroprotective measures: normothermia, euglycemia, HOB greater than 30  when able post procedure, head in neutral alignment, normocapnia, normoxia.  -Trend CBC and BMET -Aspirations precautions   Acute respiratory failure with hypoxia>> Resolved Lactic acidosis>> Resolved Extubated 11/12 Plan Aggressive pulmonary Toilet IS Q 1 while wake OOB to chair per PT  HX HTN No home meds on profile Orthostatic BP's 11/12 - Cleviprex off   - Required Labetelol x 2 overnight 11/13 - Need to initiate oral anti-hypertensive medications per neuro with care due to orthostatic issues  AKI Hypokalemia Creatinine 1.3, baseline 1.1-1.2>> Creatinine has returned to baseline -Ensure renal perfusion.  -Goal MAP 65 or greater. -Avoid neprotoxic drugs as possible. -Strict I&O's - Trend BMET daily - Replete electrolytes as needed   Alcohol use disorder ETOH level 16 on admission No withdrawal noted through 11/13  [place] ordered - If withdrawal while in ICU can use Precedex  Active Smoker Hx substance abuse UDS + 11/11 for cocaine -Smoking and cocaine cessation education when appropriate.  - Qualifies for lung cancer screening  Doing well post extubation , weaned to 2 L. Nursing to wean to RA as able CXR clear, self diuresed 11/12 Off Cleviprex and all sedation  RUE drift PCCM will transition medical care to [facility] for 11/14 am Best Practice (right click and "Reselect all SmartList Selections" daily)   Diet/type: NPO w/ meds via tube DVT prophylaxis: LMWH GI prophylaxis: PPI Lines: N/A Foley:  N/A Code Status:  full code Last date of multidisciplinary goals of care discussion [Per primary]   Critical care time: 8 minutes  Magdalen Spatz, MSN, AGACNP-BC [place] for personal pager PCCM on call pager [phone]  03/17/2021 , 7:45 AM  If no response, please call [phone] After hours, please call Elink at [phone]  Attending Attestation: I have taken an interval history, reviewed the chart and  examined the patient. I agree with the Advanced Practitioner's note, impression, and recommendations as outlined.   Briefly, , 70 year old male with hx cocaine and alcohol use admitted for brainstem and right occipital cortex infarct secondary to basilar artery occlusion s/p revascularization 03/15/21.     Extubated yesterday. Required cleviprex which has now been weaned off.  Blood pressure 129/71, pulse 78, temperature 98.7 F (37.1 C), temperature source Oral, resp. rate (!) 25, height 6' (1.829 m), weight 72.6 kg, SpO2 97 %. On exam, thin male on mechanical ventilation. AAOx4, follow commands. Right arm drift. Moves extremities x 4. Lungs CTAB, no wheezing. Heart RRR, no murmur. Extremities no edema, no tenderness.  CT head 11/12 - Acute infarct in brainstem and right occipital cortex CTA head/neck 11/12 - Acute occlusion of mid basilar A, bilateral 80% carotid bifurcations, moderate stenosis at the supraclinoid right ICA, severe stenosis at the supraclinoid left ICA   Right occipital cortex and brainstem stroke secondary to basilar artery occlusion - per primary team Acute hypoxemic respiratory failure - Passed SBT. Extubated this am to [place]. Stable in PM. AKI - resolved Cocaine use  Hypertensive urgency - Weaned off cleviprex. Add amlodipine  Pulmonary will sign off  Independent Care Time: 35 Minutes.   Rodman Pickle, M.D. [facility] Pulmonary/Critical Care Medicine 03/17/2021 9:42 AM   Please see Amion for pager number to reach on-call Pulmonary and Critical Care Team.

## 2021-03-17 NOTE — Progress Notes (Addendum)
STROKE TEAM PROGRESS NOTE   INTERVAL HISTORY Patient is seen in his room with no family at bedside.  He is doing well post-extubation and is able to converse well.  He has been hemodynamically stable off Cleviprex and has had no acute events overnight.  Vitals:   03/17/21 0646 03/17/21 0700 03/17/21 0800 03/17/21 1200  BP: 135/77 129/71    Pulse: 80 78    Resp: (!) 27 (!) 25    Temp:   98.7 F (37.1 C) 98.6 F (37 C)  TempSrc:   Oral Axillary  SpO2: 96% 97%    Weight:      Height:       CBC:  Recent Labs  Lab 03/16/21 1845 03/17/21 0333  WBC 9.7 8.4  NEUTROABS 7.5 6.1  HGB 12.4* 12.1*  HCT 40.0 38.9*  MCV 95.7 95.3  PLT 167 811    Basic Metabolic Panel:  Recent Labs  Lab 03/16/21 0453 03/16/21 1229 03/17/21 0333  NA 139  --  139  K 3.5  --  4.0  CL 110  --  107  CO2 21*  --  26  GLUCOSE 112*  --  90  BUN 12  --  9  CREATININE 1.13  --  1.16  CALCIUM 8.1*  --  8.5*  MG  --  1.9  --     Lipid Panel:  Recent Labs  Lab 03/16/21 0453  CHOL 135  TRIG 49  HDL 71  CHOLHDL 1.9  VLDL 10  LDLCALC 54    HgbA1c:  Recent Labs  Lab 03/16/21 0453  HGBA1C 4.6*    Urine Drug Screen:  Recent Labs  Lab 03/15/21 2133  LABOPIA NONE DETECTED  COCAINSCRNUR POSITIVE*  LABBENZ NONE DETECTED  AMPHETMU NONE DETECTED  THCU NONE DETECTED  LABBARB NONE DETECTED     Alcohol Level  Recent Labs  Lab 03/15/21 2145  ETH 16*     IMAGING past 24 hours DG Abd 1 View  Result Date: 03/16/2021 CLINICAL DATA:  Altered mental status. Metal foreign body in abdomen. EXAM: ABDOMEN - 1 VIEW COMPARISON:  None. FINDINGS: Surgical clips in the right upper quadrant. There is no other metallic foreign body or evidence of implanted medical device in the abdomen. Mild gaseous distention of bowel loops without evidence of obstruction. Included lung bases are clear. Multilevel degenerative change in the spine. No acute osseous abnormalities are seen. IMPRESSION: Surgical clips in  the right upper quadrant from cholecystectomy. No other metallic foreign body or evidence of implanted medical device in the abdomen. Electronically Signed   By: Keith Rake M.D.   On: 03/16/2021 18:38   MR ANGIO HEAD WO CONTRAST  Result Date: 03/17/2021 CLINICAL DATA:  Follow-up examination for acute stroke. EXAM: MRI HEAD WITHOUT CONTRAST MRA HEAD WITHOUT CONTRAST TECHNIQUE: Multiplanar, multi-echo pulse sequences of the brain and surrounding structures were acquired without intravenous contrast. Angiographic images of the Circle of Willis were acquired using MRA technique without intravenous contrast. COMPARISON:  Prior studies from 03/16/2021. FINDINGS: MRI HEAD FINDINGS Brain: Moderately advanced cerebral atrophy. Patchy T2/FLAIR hyperintensity within the periventricular and deep white matter both cerebral hemispheres most consistent with chronic small vessel ischemic disease, moderate in nature. Multiple scattered remote lacunar infarcts present about the hemispheric cerebral white matter, basal ganglia, and thalami. Chronic left greater than right bilateral cerebellar infarcts. Few scattered chronic micro hemorrhages noted about the thalami, likely related to chronic poorly controlled hypertension. Patchy small volume acute ischemic infarcts seen involving the  peripheral right cerebellum (series 5, image 54), pons (series 5, image 61), right occipital lobe (series 5, images 65, 70, 75), and right thalamus (series 5, image 73). No associated hemorrhage or mass effect. No other evidence for acute or subacute ischemia. No acute intracranial hemorrhage. No mass lesion, mass effect or midline shift. No hydrocephalus or extra-axial fluid collection. Pituitary gland suprasellar region within normal limits. Midline structures intact. Vascular: Major intracranial vascular flow voids are maintained. Skull and upper cervical spine: Craniocervical junction within normal limits. Bone marrow signal intensity  somewhat heterogeneous without focal marrow replacing lesion. No scalp soft tissue abnormality. Sinuses/Orbits: Globes and orbital soft tissues within normal limits. Scattered mucosal thickening noted within the ethmoidal air cells. Paranasal sinuses are otherwise clear. Trace right mastoid effusion noted, of doubtful significance. Inner ear structures grossly normal. Other: Chronic cystic lesion involving the subcutaneous fat of the lower left face again noted, of doubtful significance. MRA HEAD FINDINGS Anterior circulation: Examination mildly degraded by motion. Visualized distal cervical segments of the internal carotid arteries are patent with antegrade flow. Petrous and cavernous segments patent bilaterally. Moderate stenosis at the supraclinoid left ICA (series 1047, image 19). No significant narrowing about the contralateral supraclinoid right ICA by MRA. Right A1 patent. Left A1 hypoplastic and/or absent, accounting for the diminutive left ICA is compared to the right. Normal anterior communicating artery complex. Anterior cerebral arteries patent without stenosis. M1 segments patent without definite stenosis. Irregularity at the mid left M1 on MIP reconstructions felt to be consistent with motion artifact. No proximal MCA branch occlusion. Distal MCA branches perfused and symmetric. Posterior circulation: Atheromatous irregularity within the V4 segments bilaterally without hemodynamically significant stenosis by MRA. Right vertebral artery slightly dominant. Right PICA patent at its origin. Left PICA not seen. Interval placement of a vascular stent within the basilar artery, extending from the level of the AICAs distally. Grossly patent flow through the stent, although this is not well assessed by MRA. AICA remain perfused. Patent flow seen distally within the basilar artery. Superior cerebral arteries patent bilaterally. Right PCA supplied via the basilar. Fetal type origin left PCA. PCAs remain patent to  their distal aspects without high-grade stenosis. Anatomic variants: Fetal type origin of the left PCA. Hypoplastic/absent left A1. IMPRESSION: MRI HEAD IMPRESSION: 1. Patchy small volume acute ischemic infarcts involving the right cerebellum, pons, right occipital lobe, and right thalamus as above. No associated hemorrhage or mass effect. 2. Underlying chronic microvascular ischemic disease with multiple remote lacunar infarcts about the hemispheric cerebral white matter, deep gray nuclei, and cerebellum. MRA HEAD IMPRESSION: 1. Interval stenting of the basilar artery. Grossly patent flow through the stent, with good perfusion distally. 2. Moderate stenosis at the supraclinoid left ICA. 3. Additional mild intracranial atherosclerotic disease. No other hemodynamically significant stenosis by MRA. Electronically Signed   By: Jeannine Boga M.D.   On: 03/17/2021 04:56   MR BRAIN WO CONTRAST  Result Date: 03/17/2021 CLINICAL DATA:  Follow-up examination for acute stroke. EXAM: MRI HEAD WITHOUT CONTRAST MRA HEAD WITHOUT CONTRAST TECHNIQUE: Multiplanar, multi-echo pulse sequences of the brain and surrounding structures were acquired without intravenous contrast. Angiographic images of the Circle of Willis were acquired using MRA technique without intravenous contrast. COMPARISON:  Prior studies from 03/16/2021. FINDINGS: MRI HEAD FINDINGS Brain: Moderately advanced cerebral atrophy. Patchy T2/FLAIR hyperintensity within the periventricular and deep white matter both cerebral hemispheres most consistent with chronic small vessel ischemic disease, moderate in nature. Multiple scattered remote lacunar infarcts present about the hemispheric cerebral  white matter, basal ganglia, and thalami. Chronic left greater than right bilateral cerebellar infarcts. Few scattered chronic micro hemorrhages noted about the thalami, likely related to chronic poorly controlled hypertension. Patchy small volume acute ischemic  infarcts seen involving the peripheral right cerebellum (series 5, image 54), pons (series 5, image 61), right occipital lobe (series 5, images 65, 70, 75), and right thalamus (series 5, image 73). No associated hemorrhage or mass effect. No other evidence for acute or subacute ischemia. No acute intracranial hemorrhage. No mass lesion, mass effect or midline shift. No hydrocephalus or extra-axial fluid collection. Pituitary gland suprasellar region within normal limits. Midline structures intact. Vascular: Major intracranial vascular flow voids are maintained. Skull and upper cervical spine: Craniocervical junction within normal limits. Bone marrow signal intensity somewhat heterogeneous without focal marrow replacing lesion. No scalp soft tissue abnormality. Sinuses/Orbits: Globes and orbital soft tissues within normal limits. Scattered mucosal thickening noted within the ethmoidal air cells. Paranasal sinuses are otherwise clear. Trace right mastoid effusion noted, of doubtful significance. Inner ear structures grossly normal. Other: Chronic cystic lesion involving the subcutaneous fat of the lower left face again noted, of doubtful significance. MRA HEAD FINDINGS Anterior circulation: Examination mildly degraded by motion. Visualized distal cervical segments of the internal carotid arteries are patent with antegrade flow. Petrous and cavernous segments patent bilaterally. Moderate stenosis at the supraclinoid left ICA (series 1047, image 19). No significant narrowing about the contralateral supraclinoid right ICA by MRA. Right A1 patent. Left A1 hypoplastic and/or absent, accounting for the diminutive left ICA is compared to the right. Normal anterior communicating artery complex. Anterior cerebral arteries patent without stenosis. M1 segments patent without definite stenosis. Irregularity at the mid left M1 on MIP reconstructions felt to be consistent with motion artifact. No proximal MCA branch occlusion.  Distal MCA branches perfused and symmetric. Posterior circulation: Atheromatous irregularity within the V4 segments bilaterally without hemodynamically significant stenosis by MRA. Right vertebral artery slightly dominant. Right PICA patent at its origin. Left PICA not seen. Interval placement of a vascular stent within the basilar artery, extending from the level of the AICAs distally. Grossly patent flow through the stent, although this is not well assessed by MRA. AICA remain perfused. Patent flow seen distally within the basilar artery. Superior cerebral arteries patent bilaterally. Right PCA supplied via the basilar. Fetal type origin left PCA. PCAs remain patent to their distal aspects without high-grade stenosis. Anatomic variants: Fetal type origin of the left PCA. Hypoplastic/absent left A1. IMPRESSION: MRI HEAD IMPRESSION: 1. Patchy small volume acute ischemic infarcts involving the right cerebellum, pons, right occipital lobe, and right thalamus as above. No associated hemorrhage or mass effect. 2. Underlying chronic microvascular ischemic disease with multiple remote lacunar infarcts about the hemispheric cerebral white matter, deep gray nuclei, and cerebellum. MRA HEAD IMPRESSION: 1. Interval stenting of the basilar artery. Grossly patent flow through the stent, with good perfusion distally. 2. Moderate stenosis at the supraclinoid left ICA. 3. Additional mild intracranial atherosclerotic disease. No other hemodynamically significant stenosis by MRA. Electronically Signed   By: Jeannine Boga M.D.   On: 03/17/2021 04:56   DG CHEST PORT 1 VIEW  Result Date: 03/17/2021 CLINICAL DATA:  70 year old male with history of respiratory failure. EXAM: PORTABLE CHEST 1 VIEW COMPARISON:  Chest x-ray 03/16/2021. FINDINGS: Previously noted endotracheal and nasogastric tubes have been removed. Lung volumes are normal. No consolidative airspace disease. No pleural effusions. No pneumothorax. No pulmonary  nodule or mass noted. Pulmonary vasculature and the cardiomediastinal silhouette are  within normal limits. Multiple old right-sided posterolateral rib fractures are again noted. IMPRESSION: 1.  No radiographic evidence of acute cardiopulmonary disease. Electronically Signed   By: Vinnie Langton M.D.   On: 03/17/2021 08:07    PHYSICAL EXAM General:  Patient is a thin appearing, well developed male in no distress   NEURO:  Mental Status: AA&Ox3  Speech/Language: speech is without dysarthria or aphasia.  Naming, repetition, fluency, and comprehension intact.  Cranial Nerves:  II: PERRL. Visual fields full.  III, IV, VI: EOMI. Eyelids elevate symmetrically.  V: Sensation is intact to light touch and symmetrical to face.  VII: Right sided facial droop present. Able to puff cheeks and raise eyebrows.  VIII: hearing intact to voice. IX, X: Phonation is normal.  XII: tongue is midline without fasciculations. Motor: 5/5 strength to LUE and LLE,  3/5 strength to RUE, 4/5 strength to RLE Tone: is normal and bulk is normal Sensation- Intact to light touch bilaterally.  Coordination: FTN shows ataxia on the left, UTA in right, HKS: some ataxia in BLE.  Gait- deferred   ASSESSMENT/PLAN [name] is a 70 y.o. male with history of HTN, ischemic stroke x2, tobacco abuse and cocaine abuse presenting with brainstem and right occipital cortex stroke secondary to LVO of basilar artery. Patient was taken to IR for angiogram and thrombectomy, and TICI 3 flow was achieved.  He was left intubated after the procedure for airway protection and was extubated yesterday morning.  Repeat CT head demonstrates an acute infarct in the brainstem and right occipital cortex with no acute hemorrhage.  MRI to be obtained post extubation.  CIWA protocol initiated post-extubation as well.  Patient is currently doing well post-extubation and BP is stable off Cleviprex.  He is ready to be transferred out of the ICU  today.  Discussed patient's use of tobacco, alcohol and cocaine today.  Patient was strongly advised to stop use of tobacco and cocaine and to minimize alcohol consumption.  Stroke: b/l pontine and right PCA scattered infarct secondary to progressive large vessel disease with persistent cocaine abuse, smoker and alcohol abuse Code Stroke CT head No acute abnormality.    CTA head & neck Acute occlusion of mid basilar artery, atheromatous disease in V4 segments with multfocal narrowing, left worse than right, 80% atheromatous stenoses at carotid bifurcations, moderate stenosis of supraclinoid right ICA, severe stenosis of supraclinoid left ICA, moderate distal left M1 stenosis Post IR CT acute infarct in brainstem and right occipital cortex MRI small volume acute ischemic infarcts in right cerebellum, pons, right occipital lobe and right thalamus with underlying chronic microvascular ischemic disease MRA Patent flow through basilar artery stent and moderate stenosis of supraclinoid left ICA 2D Echo EF 50-55% LDL 54 HgbA1c 4.6 UDS positive for cocaine VTE prophylaxis - Lovenox aspirin 81 mg daily and clopidogrel 75 mg daily prior to admission, now on aspirin 81 mg daily and Brilinta (ticagrelor) 90 mg bid.  Therapy recommendations:  CIR Disposition:  pending  History of stroke 05/2015, MRI showed right pontine infarct, remote right BG and cerebellum infarct.  CTA head and neck showed right ICA 60% stenosis, left ICA 50% stenosis, bilateral ICA siphon, VA and BA stenosis.  EF 50 to 55%, LDL 52, UDS positive for cocaine.  Discharged with DAPT and Zocor 04/2017 admitted for right-sided weakness, slurred speech and right facial droop.  MRI showed left pontine infarct.  CT head and neck bilateral ICA bulb 30 to 40% stenosis.  Severe left siphon  stenosis, basilar artery stenosis.  EF 60 to 65%.  A1c 4.6, LDL 45, UDS positive for cocaine.  Discharged with DAPT and Lipitor 80.  Progressive intracranial  vascular stenosis 05/2015, CT head and neck showed bilateral VAs and BA stenosis, however BA patent 04/2017 CTA head and neck showed similar bilateral VAs and BA stenosis but patent Current admission CTA head and neck showed progressive worsening bilateral V4 stenosis and atherosclerosis.  BA occlusion.  Hypertension Home meds:  none BP goal < 180/105 now Stable off Cleviprex On amlodipine 10 Long-term BP goal normotensive  Hyperlipidemia Home meds:  atorvastatin 80 mg daily, resumed in hospital LDL 54, goal < 70 High intensity statin continued Continue statin at discharge  Cocaine abuse UDS positive for cocaine, patient also used cocaine prior to previous strokes. Social work consult for substance abuse cessation assistance Cessation education will be provided  Tobacco abuse Current smoker Smoking cessation counseling will be provided  Alcohol abuse On CIWA protocol FA/MVI/B1 Education on alcohol Limited use will be provided ETOH use, alcohol level 16, will advise to drink no more than 2 drink(s) a day  Other Stroke Risk Factors Advanced Age >/= 82   Other Active Problems    Hospital day # Waves, NP   ATTENDING NOTE: I reviewed above note and agree with the assessment and plan. Pt was seen and examined.   RN at the bedside. Pt extubated yesterday, tolerating well. Also passed swallow on diet. Still has right facial droop and right sided weakness but improving. Also has left FTN and HTS ataxia. No nystagmus. PT/OT recommend CIR.   On exam, pt awake, alert, eyes open, orientated to age, place, time. No aphasia, however moderate to severe dysarthria, following all simple commands. No gaze palsy, tracking bilaterally, visual field full, PERRL, no nystagmus or disconjugate eyes. Mild right facial droop. Tongue midline. LUE and LLE at least 4/5, RUE 3-/5 proximal and distally. RLE 3/5 proximal and 3+/5 distally. Sensation decreased on the left, left FTN  ataxia and HTS dysmetria. R HTS dysmetria but seems not out of proportion to the weakness, gait not tested.   Patient was educated on cessation of cocaine, tobacco and also limit alcohol use.  He is willing to quit.  Also educated on medication compliance given stent on DAPT.  Continue aspirin and Brilinta as well as Lipitor.  BP goal less than 180/105.  On amlodipine 10.  For detailed assessment and plan, please refer to above as I have made changes wherever appropriate.   Rosalin Hawking, MD PhD Stroke Neurology 03/17/2021 6:53 PM  This patient is critically ill due to basilar artery occlusion status post IR, brainstem infarct, cocaine abuse, alcohol abuse and at significant risk of neurological worsening, death form DT, seizure, hemorrhagic conversion, recurrent stroke, respiratory failure. This patient's care requires constant monitoring of vital signs, hemodynamics, respiratory and cardiac monitoring, review of multiple databases, neurological assessment, discussion with family, other specialists and medical decision making of high complexity. I spent 35 minutes of neurocritical care time in the care of this patient.    To contact Stroke Continuity provider, please refer to [email or web address] After hours, contact General Neurology

## 2021-03-17 NOTE — Progress Notes (Signed)
Referring Physician(s): Code stroke  Supervising Physician: Luanne Bras  Patient Status:  [facility] - In-pt  Chief Complaint: Follow up basilar artery thrombectomy and stent assisted angioplasty 03/16/21 with Dr. Estanislado Pandy  Subjective:  Patient seen in room, no visitors/staff at bedside. He denies complaints, remains on supplemental oxygen. Planning to get up soon.  Allergies: Patient has no known allergies.  Medications: Prior to Admission medications   Medication Sig Start Date End Date Taking? Authorizing Provider  aspirin 81 MG EC tablet Take 1 tablet (81 mg total) by mouth daily. 06/04/17   Kalman Shan Ratliff, DO  atorvastatin (LIPITOR) 80 MG tablet Take 1 tablet (80 mg total) by mouth daily at 6 PM. 06/04/17   Hoffman, Elza Rafter, DO  clopidogrel (PLAVIX) 75 MG tablet Take 1 tablet (75 mg total) by mouth daily. 06/04/17   Kalman Shan Ratliff, DO  polyethylene glycol (MIRALAX / GLYCOLAX) 17 g packet Take 17 g by mouth daily. 10/14/18   Carroll Sage, MD  senna-docusate (SENOKOT-S) 8.6-50 MG tablet Take 1 tablet by mouth at bedtime as needed for mild constipation. 10/13/18   Carroll Sage, MD     Vital Signs: BP 129/71   Pulse 78   Temp 98.7 F (37.1 C) (Oral)   Resp (!) 25   Ht 6' (1.829 m)   Wt 160 lb 0.9 oz (72.6 kg)   SpO2 97%   BMI 21.71 kg/m   Physical Exam Vitals and nursing note reviewed.  Constitutional:      General: He is not in acute distress. Cardiovascular:     Rate and Rhythm: Normal rate.     Comments: (+) right CFA puncture site clean, dry, dressed appropriately. Soft, non tender, non pulsatile without erythema, edema, bleeding or drainage. Pulmonary:     Effort: Pulmonary effort is normal.  Skin:    General: Skin is warm and dry.  Neurological:     Mental Status: He is alert. Mental status is at baseline.  Alert, awake, and oriented x 3 Speech and comprehension in tact - no dysarthria noted Visual fields grossly in  tact Right facial droop Motor power moves all 4 extremities spontaneously but notable weaker on RUE/RLE extremities.   Imaging: DG Abd 1 View  Result Date: 03/16/2021 CLINICAL DATA:  Altered mental status. Metal foreign body in abdomen. EXAM: ABDOMEN - 1 VIEW COMPARISON:  None. FINDINGS: Surgical clips in the right upper quadrant. There is no other metallic foreign body or evidence of implanted medical device in the abdomen. Mild gaseous distention of bowel loops without evidence of obstruction. Included lung bases are clear. Multilevel degenerative change in the spine. No acute osseous abnormalities are seen. IMPRESSION: Surgical clips in the right upper quadrant from cholecystectomy. No other metallic foreign body or evidence of implanted medical device in the abdomen. Electronically Signed   By: Keith Rake M.D.   On: 03/16/2021 18:38   CT HEAD WO CONTRAST (5MM)  Result Date: 03/16/2021 CLINICAL DATA:  Stroke follow-up.  Basilar stent placement. EXAM: CT HEAD WITHOUT CONTRAST TECHNIQUE: Contiguous axial images were obtained from the base of the skull through the vertex without intravenous contrast. COMPARISON:  CT and CTA from earlier the same day FINDINGS: Brain: Acute infarct is likely present in the left paramedian brainstem and to a small degree in the right occipital cortex. Remote left inferior cerebellar infarction. Chronic lacunar infarcts. No hemorrhage, hydrocephalus, or collection. Vascular: Atheromatous calcification and mid basilar stent. Skull: Negative Sinuses/Orbits: Negative IMPRESSION: Acute infarct  likely in the brainstem and right occipital cortex. No acute hemorrhage. Electronically Signed   By: Jorje Guild M.D.   On: 03/16/2021 06:58   CT HEAD WO CONTRAST  Result Date: 03/15/2021 CLINICAL DATA:  Mental status change, neck trauma. EXAM: CT HEAD WITHOUT CONTRAST CT CERVICAL SPINE WITHOUT CONTRAST TECHNIQUE: Multidetector CT imaging of the head and cervical spine was  performed following the standard protocol without intravenous contrast. Multiplanar CT image reconstructions of the cervical spine were also generated. COMPARISON:  CT head and cervical spine 08/30/2017 FINDINGS: CT HEAD FINDINGS Brain: Chronic left cerebellar and left basal ganglia infarction. No evidence of large-territorial acute infarction. No parenchymal hemorrhage. No mass lesion. No extra-axial collection. No mass effect or midline shift. No hydrocephalus. Basilar cisterns are patent. Vascular: No hyperdense vessel. Atherosclerotic calcifications are present within the cavernous internal carotid arteries. Skull: No acute fracture or focal lesion. Sinuses/Orbits: Paranasal sinuses and mastoid air cells are clear. The orbits are unremarkable. Other: None. CT CERVICAL SPINE FINDINGS Alignment: Stable grade 1 anterolisthesis of C2 on C3 and C7 on T1. Skull base and vertebrae: Multilevel severe degenerative changes of the spine. Associated multilevel severe neural foraminal stenosis. No severe osseous central canal stenosis. No acute fracture. No aggressive appearing focal osseous lesion or focal pathologic process. Soft tissues and spinal canal: No prevertebral fluid or swelling. No visible canal hematoma. Upper chest: Centrilobular emphysematous changes. Other: None. IMPRESSION: 1. No acute intracranial abnormality. 2. No acute displaced fracture or traumatic listhesis of the cervical spine in a patient with multilevel degenerative change of the spine leading to severe osseous neural foraminal stenosis. 3.  Emphysema (ICD10-J43.9). Electronically Signed   By: Iven Finn M.D.   On: 03/15/2021 22:35   CT CERVICAL SPINE WO CONTRAST  Result Date: 03/15/2021 CLINICAL DATA:  Mental status change, neck trauma. EXAM: CT HEAD WITHOUT CONTRAST CT CERVICAL SPINE WITHOUT CONTRAST TECHNIQUE: Multidetector CT imaging of the head and cervical spine was performed following the standard protocol without intravenous  contrast. Multiplanar CT image reconstructions of the cervical spine were also generated. COMPARISON:  CT head and cervical spine 08/30/2017 FINDINGS: CT HEAD FINDINGS Brain: Chronic left cerebellar and left basal ganglia infarction. No evidence of large-territorial acute infarction. No parenchymal hemorrhage. No mass lesion. No extra-axial collection. No mass effect or midline shift. No hydrocephalus. Basilar cisterns are patent. Vascular: No hyperdense vessel. Atherosclerotic calcifications are present within the cavernous internal carotid arteries. Skull: No acute fracture or focal lesion. Sinuses/Orbits: Paranasal sinuses and mastoid air cells are clear. The orbits are unremarkable. Other: None. CT CERVICAL SPINE FINDINGS Alignment: Stable grade 1 anterolisthesis of C2 on C3 and C7 on T1. Skull base and vertebrae: Multilevel severe degenerative changes of the spine. Associated multilevel severe neural foraminal stenosis. No severe osseous central canal stenosis. No acute fracture. No aggressive appearing focal osseous lesion or focal pathologic process. Soft tissues and spinal canal: No prevertebral fluid or swelling. No visible canal hematoma. Upper chest: Centrilobular emphysematous changes. Other: None. IMPRESSION: 1. No acute intracranial abnormality. 2. No acute displaced fracture or traumatic listhesis of the cervical spine in a patient with multilevel degenerative change of the spine leading to severe osseous neural foraminal stenosis. 3.  Emphysema (ICD10-J43.9). Electronically Signed   By: Iven Finn M.D.   On: 03/15/2021 22:35   MR ANGIO HEAD WO CONTRAST  Result Date: 03/17/2021 CLINICAL DATA:  Follow-up examination for acute stroke. EXAM: MRI HEAD WITHOUT CONTRAST MRA HEAD WITHOUT CONTRAST TECHNIQUE: Multiplanar, multi-echo pulse sequences of  the brain and surrounding structures were acquired without intravenous contrast. Angiographic images of the Circle of Willis were acquired using MRA  technique without intravenous contrast. COMPARISON:  Prior studies from 03/16/2021. FINDINGS: MRI HEAD FINDINGS Brain: Moderately advanced cerebral atrophy. Patchy T2/FLAIR hyperintensity within the periventricular and deep white matter both cerebral hemispheres most consistent with chronic small vessel ischemic disease, moderate in nature. Multiple scattered remote lacunar infarcts present about the hemispheric cerebral white matter, basal ganglia, and thalami. Chronic left greater than right bilateral cerebellar infarcts. Few scattered chronic micro hemorrhages noted about the thalami, likely related to chronic poorly controlled hypertension. Patchy small volume acute ischemic infarcts seen involving the peripheral right cerebellum (series 5, image 54), pons (series 5, image 61), right occipital lobe (series 5, images 65, 70, 75), and right thalamus (series 5, image 73). No associated hemorrhage or mass effect. No other evidence for acute or subacute ischemia. No acute intracranial hemorrhage. No mass lesion, mass effect or midline shift. No hydrocephalus or extra-axial fluid collection. Pituitary gland suprasellar region within normal limits. Midline structures intact. Vascular: Major intracranial vascular flow voids are maintained. Skull and upper cervical spine: Craniocervical junction within normal limits. Bone marrow signal intensity somewhat heterogeneous without focal marrow replacing lesion. No scalp soft tissue abnormality. Sinuses/Orbits: Globes and orbital soft tissues within normal limits. Scattered mucosal thickening noted within the ethmoidal air cells. Paranasal sinuses are otherwise clear. Trace right mastoid effusion noted, of doubtful significance. Inner ear structures grossly normal. Other: Chronic cystic lesion involving the subcutaneous fat of the lower left face again noted, of doubtful significance. MRA HEAD FINDINGS Anterior circulation: Examination mildly degraded by motion. Visualized  distal cervical segments of the internal carotid arteries are patent with antegrade flow. Petrous and cavernous segments patent bilaterally. Moderate stenosis at the supraclinoid left ICA (series 1047, image 19). No significant narrowing about the contralateral supraclinoid right ICA by MRA. Right A1 patent. Left A1 hypoplastic and/or absent, accounting for the diminutive left ICA is compared to the right. Normal anterior communicating artery complex. Anterior cerebral arteries patent without stenosis. M1 segments patent without definite stenosis. Irregularity at the mid left M1 on MIP reconstructions felt to be consistent with motion artifact. No proximal MCA branch occlusion. Distal MCA branches perfused and symmetric. Posterior circulation: Atheromatous irregularity within the V4 segments bilaterally without hemodynamically significant stenosis by MRA. Right vertebral artery slightly dominant. Right PICA patent at its origin. Left PICA not seen. Interval placement of a vascular stent within the basilar artery, extending from the level of the AICAs distally. Grossly patent flow through the stent, although this is not well assessed by MRA. AICA remain perfused. Patent flow seen distally within the basilar artery. Superior cerebral arteries patent bilaterally. Right PCA supplied via the basilar. Fetal type origin left PCA. PCAs remain patent to their distal aspects without high-grade stenosis. Anatomic variants: Fetal type origin of the left PCA. Hypoplastic/absent left A1. IMPRESSION: MRI HEAD IMPRESSION: 1. Patchy small volume acute ischemic infarcts involving the right cerebellum, pons, right occipital lobe, and right thalamus as above. No associated hemorrhage or mass effect. 2. Underlying chronic microvascular ischemic disease with multiple remote lacunar infarcts about the hemispheric cerebral white matter, deep gray nuclei, and cerebellum. MRA HEAD IMPRESSION: 1. Interval stenting of the basilar artery.  Grossly patent flow through the stent, with good perfusion distally. 2. Moderate stenosis at the supraclinoid left ICA. 3. Additional mild intracranial atherosclerotic disease. No other hemodynamically significant stenosis by MRA. Electronically Signed   By: Marland Kitchen  Jeannine Boga M.D.   On: 03/17/2021 04:56   MR BRAIN WO CONTRAST  Result Date: 03/17/2021 CLINICAL DATA:  Follow-up examination for acute stroke. EXAM: MRI HEAD WITHOUT CONTRAST MRA HEAD WITHOUT CONTRAST TECHNIQUE: Multiplanar, multi-echo pulse sequences of the brain and surrounding structures were acquired without intravenous contrast. Angiographic images of the Circle of Willis were acquired using MRA technique without intravenous contrast. COMPARISON:  Prior studies from 03/16/2021. FINDINGS: MRI HEAD FINDINGS Brain: Moderately advanced cerebral atrophy. Patchy T2/FLAIR hyperintensity within the periventricular and deep white matter both cerebral hemispheres most consistent with chronic small vessel ischemic disease, moderate in nature. Multiple scattered remote lacunar infarcts present about the hemispheric cerebral white matter, basal ganglia, and thalami. Chronic left greater than right bilateral cerebellar infarcts. Few scattered chronic micro hemorrhages noted about the thalami, likely related to chronic poorly controlled hypertension. Patchy small volume acute ischemic infarcts seen involving the peripheral right cerebellum (series 5, image 54), pons (series 5, image 61), right occipital lobe (series 5, images 65, 70, 75), and right thalamus (series 5, image 73). No associated hemorrhage or mass effect. No other evidence for acute or subacute ischemia. No acute intracranial hemorrhage. No mass lesion, mass effect or midline shift. No hydrocephalus or extra-axial fluid collection. Pituitary gland suprasellar region within normal limits. Midline structures intact. Vascular: Major intracranial vascular flow voids are maintained. Skull and upper  cervical spine: Craniocervical junction within normal limits. Bone marrow signal intensity somewhat heterogeneous without focal marrow replacing lesion. No scalp soft tissue abnormality. Sinuses/Orbits: Globes and orbital soft tissues within normal limits. Scattered mucosal thickening noted within the ethmoidal air cells. Paranasal sinuses are otherwise clear. Trace right mastoid effusion noted, of doubtful significance. Inner ear structures grossly normal. Other: Chronic cystic lesion involving the subcutaneous fat of the lower left face again noted, of doubtful significance. MRA HEAD FINDINGS Anterior circulation: Examination mildly degraded by motion. Visualized distal cervical segments of the internal carotid arteries are patent with antegrade flow. Petrous and cavernous segments patent bilaterally. Moderate stenosis at the supraclinoid left ICA (series 1047, image 19). No significant narrowing about the contralateral supraclinoid right ICA by MRA. Right A1 patent. Left A1 hypoplastic and/or absent, accounting for the diminutive left ICA is compared to the right. Normal anterior communicating artery complex. Anterior cerebral arteries patent without stenosis. M1 segments patent without definite stenosis. Irregularity at the mid left M1 on MIP reconstructions felt to be consistent with motion artifact. No proximal MCA branch occlusion. Distal MCA branches perfused and symmetric. Posterior circulation: Atheromatous irregularity within the V4 segments bilaterally without hemodynamically significant stenosis by MRA. Right vertebral artery slightly dominant. Right PICA patent at its origin. Left PICA not seen. Interval placement of a vascular stent within the basilar artery, extending from the level of the AICAs distally. Grossly patent flow through the stent, although this is not well assessed by MRA. AICA remain perfused. Patent flow seen distally within the basilar artery. Superior cerebral arteries patent  bilaterally. Right PCA supplied via the basilar. Fetal type origin left PCA. PCAs remain patent to their distal aspects without high-grade stenosis. Anatomic variants: Fetal type origin of the left PCA. Hypoplastic/absent left A1. IMPRESSION: MRI HEAD IMPRESSION: 1. Patchy small volume acute ischemic infarcts involving the right cerebellum, pons, right occipital lobe, and right thalamus as above. No associated hemorrhage or mass effect. 2. Underlying chronic microvascular ischemic disease with multiple remote lacunar infarcts about the hemispheric cerebral white matter, deep gray nuclei, and cerebellum. MRA HEAD IMPRESSION: 1. Interval stenting of the basilar  artery. Grossly patent flow through the stent, with good perfusion distally. 2. Moderate stenosis at the supraclinoid left ICA. 3. Additional mild intracranial atherosclerotic disease. No other hemodynamically significant stenosis by MRA. Electronically Signed   By: Jeannine Boga M.D.   On: 03/17/2021 04:56   DG CHEST PORT 1 VIEW  Result Date: 03/17/2021 CLINICAL DATA:  70 year old male with history of respiratory failure. EXAM: PORTABLE CHEST 1 VIEW COMPARISON:  Chest x-ray 03/16/2021. FINDINGS: Previously noted endotracheal and nasogastric tubes have been removed. Lung volumes are normal. No consolidative airspace disease. No pleural effusions. No pneumothorax. No pulmonary nodule or mass noted. Pulmonary vasculature and the cardiomediastinal silhouette are within normal limits. Multiple old right-sided posterolateral rib fractures are again noted. IMPRESSION: 1.  No radiographic evidence of acute cardiopulmonary disease. Electronically Signed   By: Vinnie Langton M.D.   On: 03/17/2021 08:07   DG CHEST PORT 1 VIEW  Result Date: 03/16/2021 CLINICAL DATA:  Intubation an orogastric tube placement. EXAM: PORTABLE CHEST 1 VIEW COMPARISON:  02/12/2021. FINDINGS: The heart size and mediastinal contours are within normal limits. Both lungs are  clear. Old healed rib fractures are noted bilaterally. An enteric tube courses over the left upper quadrant and out of the field of view. The side port projects over the anticipated region of the stomach. The endotracheal tube terminates 3.3 cm above the carina. IMPRESSION: 1. No acute cardiopulmonary process. 2. Medical devices as described above. Electronically Signed   By: Brett Fairy M.D.   On: 03/16/2021 04:29   DG Chest Port 1 View  Result Date: 03/15/2021 CLINICAL DATA:  Question aspiration. Found on ground at side of apartment. Gurgling respirations. EXAM: PORTABLE CHEST 1 VIEW COMPARISON:  Radiographs 10/29/2020 FINDINGS: The cardiomediastinal contours are normal. The lungs are clear. Pulmonary vasculature is normal. No consolidation, pleural effusion, or pneumothorax. Right posterior rib fractures are remote, they were acute on prior exam. There also remote left rib fractures. No acute osseous abnormalities are seen. IMPRESSION: 1. No acute chest findings. No radiographic findings to suggest aspiration. 2. Remote bilateral rib fractures. Electronically Signed   By: Keith Rake M.D.   On: 03/15/2021 22:08   ECHOCARDIOGRAM COMPLETE  Result Date: 03/16/2021    ECHOCARDIOGRAM REPORT   Patient Name:   Andre [name] Date of Exam: 03/16/2021 Medical Rec #:  [id number]          Height:       72.0 in Accession #:    [id number]         Weight:       160.1 lb Date of Birth:  Feb 04, 1951          BSA:          1.938 m Patient Age:    69 years           BP:           112/85 mmHg Patient Gender: M                  HR:           84 bpm. Exam Location:  Inpatient Procedure: 2D Echo, Cardiac Doppler and Color Doppler Indications:    Stroke  History:        Patient has prior history of Echocardiogram examinations. Risk                 Factors:Hypertension.  Sonographer:    Jyl Heinz Referring Phys: [id number] [place]  1. Left ventricular ejection  fraction, by estimation, is 50 to  55%. The left ventricle has low normal function. The left ventricle has no regional wall motion abnormalities. There is mild left ventricular hypertrophy. Left ventricular diastolic parameters are indeterminate.  2. Right ventricular systolic function is normal. The right ventricular size is normal. There is normal pulmonary artery systolic pressure. The estimated right ventricular systolic pressure is 17.4 mmHg.  3. The mitral valve is abnormal. Trivial mitral valve regurgitation.  4. The aortic valve is tricuspid. Aortic valve regurgitation is trivial. Aortic valve sclerosis/calcification is present, without any evidence of aortic stenosis.  5. The inferior vena cava is normal in size with <50% respiratory variability, suggesting right atrial pressure of 8 mmHg. Comparison(s): No prior Echocardiogram. FINDINGS  Left Ventricle: Left ventricular ejection fraction, by estimation, is 50 to 55%. The left ventricle has low normal function. The left ventricle has no regional wall motion abnormalities. The left ventricular internal cavity size was normal in size. There is mild left ventricular hypertrophy. Left ventricular diastolic parameters are indeterminate. Right Ventricle: The right ventricular size is normal. No increase in right ventricular wall thickness. Right ventricular systolic function is normal. There is normal pulmonary artery systolic pressure. The tricuspid regurgitant velocity is 2.23 m/s, and  with an assumed right atrial pressure of 8 mmHg, the estimated right ventricular systolic pressure is 94.4 mmHg. Left Atrium: Left atrial size was normal in size. Right Atrium: Right atrial size was normal in size. Pericardium: There is no evidence of pericardial effusion. Mitral Valve: The mitral valve is abnormal. There is mild thickening of the mitral valve leaflet(s). Trivial mitral valve regurgitation. Tricuspid Valve: The tricuspid valve is grossly normal. Tricuspid valve regurgitation is mild. Aortic  Valve: The aortic valve is tricuspid. There is mild aortic valve annular calcification. Aortic valve regurgitation is trivial. Aortic valve sclerosis/calcification is present, without any evidence of aortic stenosis. Aortic valve peak gradient measures 9.6 mmHg. Pulmonic Valve: The pulmonic valve was grossly normal. Pulmonic valve regurgitation is trivial. Aorta: The aortic root is normal in size and structure. Venous: The inferior vena cava is normal in size with less than 50% respiratory variability, suggesting right atrial pressure of 8 mmHg. IAS/Shunts: No atrial level shunt detected by color flow Doppler.  LEFT VENTRICLE PLAX 2D LVIDd:         4.70 cm     Diastology LVIDs:         3.30 cm     LV e' medial:    6.74 cm/s LV PW:         1.10 cm     LV E/e' medial:  8.1 LV IVS:        1.10 cm     LV e' lateral:   6.09 cm/s LVOT diam:     2.60 cm     LV E/e' lateral: 9.0 LV SV:         89 LV SV Index:   46 LVOT Area:     5.31 cm  LV Volumes (MOD) LV vol d, MOD A2C: 97.4 ml LV vol d, MOD A4C: 86.4 ml LV vol s, MOD A2C: 46.6 ml LV vol s, MOD A4C: 41.6 ml LV SV MOD A2C:     50.8 ml LV SV MOD A4C:     86.4 ml LV SV MOD BP:      48.6 ml RIGHT VENTRICLE             IVC RV Basal diam:  3.30 cm  IVC diam: 2.00 cm RV Mid diam:    2.40 cm RV S prime:     12.00 cm/s TAPSE (M-mode): 2.1 cm LEFT ATRIUM           Index        RIGHT ATRIUM           Index LA diam:      3.10 cm 1.60 cm/m   RA Area:     10.40 cm LA Vol (A2C): 12.0 ml 6.19 ml/m   RA Volume:   19.80 ml  10.22 ml/m LA Vol (A4C): 53.5 ml 27.60 ml/m  AORTIC VALVE AV Area (Vmax): 3.23 cm AV Vmax:        155.00 cm/s AV Peak Grad:   9.6 mmHg LVOT Vmax:      94.20 cm/s LVOT Vmean:     71.400 cm/s LVOT VTI:       0.167 m  AORTA Ao Root diam: 3.20 cm MITRAL VALVE               TRICUSPID VALVE MV Area (PHT): 3.91 cm    TR Peak grad:   19.9 mmHg MV Decel Time: 194 msec    TR Vmax:        223.00 cm/s MV E velocity: 54.70 cm/s MV A velocity: 68.90 cm/s  SHUNTS MV E/A  ratio:  0.79        Systemic VTI:  0.17 m                            Systemic Diam: 2.60 cm Rozann Lesches MD Electronically signed by Rozann Lesches MD Signature Date/Time: 03/16/2021/12:07:57 PM    Final    CT ANGIO HEAD CODE STROKE  Result Date: 03/16/2021 CLINICAL DATA:  Initial evaluation for acute stroke. EXAM: CT ANGIOGRAPHY HEAD AND NECK TECHNIQUE: Multidetector CT imaging of the head and neck was performed using the standard protocol during bolus administration of intravenous contrast. Multiplanar CT image reconstructions and MIPs were obtained to evaluate the vascular anatomy. Carotid stenosis measurements (when applicable) are obtained utilizing NASCET criteria, using the distal internal carotid diameter as the denominator. CONTRAST:  36mL OMNIPAQUE IOHEXOL 350 MG/ML SOLN COMPARISON:  CT from 03/15/2021 as well as prior CTA from 05/04/2017. FINDINGS: CTA NECK FINDINGS Aortic arch: Partially visualized aortic arch in origin of the great vessels grossly normal in caliber. No visible stenosis about the origin of the great vessels. Right carotid system: Right CCA patent from its origin to the bifurcation without stenosis. Bulky calcified plaque at the right carotid bulb with associated stenosis of up to 80% by NASCET criteria (series 7, image 222). Right ICA patent distally without stenosis, dissection or occlusion. Left carotid system: Left CCA patent from its origin to the bifurcation without stenosis. Eccentric mixed plaque at the left carotid bulb with associated stenosis of up to 80% by NASCET criteria (series 7, image 229). Left ICA patent distally without stenosis dissection or occlusion. Vertebral arteries: Both vertebral arteries arise from the subclavian arteries. No visible proximal subclavian artery stenosis. Right vertebral artery slightly dominant. Vertebral arteries patent within the neck without stenosis dissection or occlusion. Skeleton: No discrete or worrisome osseous lesions.  Moderate to advanced multilevel spondylosis noted throughout the cervical spine. Postsurgical changes noted at the mandible. Other neck: Multiple hypodense/cystic lesions noted within the subcutaneous fat of the upper face/neck, greater on the left. The largest of these seen on the left and measures 2.8 cm in  size (series 5, image 97). Few small foci of gas seen within this lesion. Additional lesion seen slightly inferiorly (series 5, image 112). Focal calcifications seen within a 1.2 cm lesion on the right (series 5, image 115). Findings are most likely benign in reflect sebaceous cysts, and are similar as compared to 2018. No other acute soft tissue abnormality within the neck. Upper chest: Emphysema. Visualized upper chest demonstrates no other acute finding. Review of the MIP images confirms the above findings CTA HEAD FINDINGS Anterior circulation: Petrous segments patent bilaterally. Atheromatous plaque within the carotid siphons bilaterally with associated moderate stenosis at the supraclinoid right ICA (series 7, image 110). Focal severe stenosis seen at the contralateral supraclinoid left ICA (series 7, image 113). Right A1 patent. Left A1 hypoplastic and/or absent. Normal anterior communicating artery complex. Anterior cerebral arteries remain patent to their distal aspects without stenosis. Right M1 widely patent. Focal moderate distal left M1 stenosis (series 10, image 22). Normal MCA bifurcations. Distal MCA branches perfused and symmetric. Posterior circulation: Extensive atheromatous disease throughout the V4 segments bilaterally with associated moderate multifocal narrowing on the right, with moderate to severe multifocal narrowing on the left. Distal left V4 segment nearly occludes prior to the vertebrobasilar junction. Right PICA origin remains patent. Left PICA not definitely seen. There is occlusion of the mid basilar artery distally, concerning for occlusive thrombus (series 8, image 110).  Perfusion of the distal basilar artery distally, likely collateral. Superior cerebellar arteries perfused and patent bilaterally. Right PCA supplied via the basilar. Multifocal atheromatous irregularity throughout the right PCA without high-grade stenosis. Fetal type origin of the left PCA which remains patent to its distal aspect. Venous sinuses: Patent allowing for timing the contrast bolus. Anatomic variants: Fetal type origin of the left PCA. No visible aneurysm. Review of the MIP images confirms the above findings IMPRESSION: 1. 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 Basilar artery is perfused distally, likely collateral in nature. Both SCAs and PCAs remain patent and perfused at this time. 2. Extensive atheromatous disease throughout the V4 segments bilaterally with associated moderate to severe multifocal narrowing, left worse than right. 3. Severe atheromatous stenoses of up to 80% at the carotid bifurcations bilaterally. 4. Moderate stenosis at the supraclinoid right ICA, with severe stenosis at the supraclinoid left ICA. 5. Moderate distal left M1 stenosis. 6. Emphysema (ICD10-J43.9). Critical Value/emergent results were called by telephone at the time of interpretation on 03/16/2021 at 12:10 a.m. to provider [facility] , who verbally acknowledged these results. Electronically Signed   By: Jeannine Boga M.D.   On: 03/16/2021 01:43   CT ANGIO NECK CODE STROKE  Result Date: 03/16/2021 CLINICAL DATA:  Initial evaluation for acute stroke. EXAM: CT ANGIOGRAPHY HEAD AND NECK TECHNIQUE: Multidetector CT imaging of the head and neck was performed using the standard protocol during bolus administration of intravenous contrast. Multiplanar CT image reconstructions and MIPs were obtained to evaluate the vascular anatomy. Carotid stenosis measurements (when applicable) are obtained utilizing NASCET criteria, using the distal internal carotid diameter as the denominator. CONTRAST:  51mL  OMNIPAQUE IOHEXOL 350 MG/ML SOLN COMPARISON:  CT from 03/15/2021 as well as prior CTA from 05/04/2017. FINDINGS: CTA NECK FINDINGS Aortic arch: Partially visualized aortic arch in origin of the great vessels grossly normal in caliber. No visible stenosis about the origin of the great vessels. Right carotid system: Right CCA patent from its origin to the bifurcation without stenosis. Bulky calcified plaque at the right carotid bulb with associated stenosis of up to  80% by NASCET criteria (series 7, image 222). Right ICA patent distally without stenosis, dissection or occlusion. Left carotid system: Left CCA patent from its origin to the bifurcation without stenosis. Eccentric mixed plaque at the left carotid bulb with associated stenosis of up to 80% by NASCET criteria (series 7, image 229). Left ICA patent distally without stenosis dissection or occlusion. Vertebral arteries: Both vertebral arteries arise from the subclavian arteries. No visible proximal subclavian artery stenosis. Right vertebral artery slightly dominant. Vertebral arteries patent within the neck without stenosis dissection or occlusion. Skeleton: No discrete or worrisome osseous lesions. Moderate to advanced multilevel spondylosis noted throughout the cervical spine. Postsurgical changes noted at the mandible. Other neck: Multiple hypodense/cystic lesions noted within the subcutaneous fat of the upper face/neck, greater on the left. The largest of these seen on the left and measures 2.8 cm in size (series 5, image 97). Few small foci of gas seen within this lesion. Additional lesion seen slightly inferiorly (series 5, image 112). Focal calcifications seen within a 1.2 cm lesion on the right (series 5, image 115). Findings are most likely benign in reflect sebaceous cysts, and are similar as compared to 2018. No other acute soft tissue abnormality within the neck. Upper chest: Emphysema. Visualized upper chest demonstrates no other acute finding.  Review of the MIP images confirms the above findings CTA HEAD FINDINGS Anterior circulation: Petrous segments patent bilaterally. Atheromatous plaque within the carotid siphons bilaterally with associated moderate stenosis at the supraclinoid right ICA (series 7, image 110). Focal severe stenosis seen at the contralateral supraclinoid left ICA (series 7, image 113). Right A1 patent. Left A1 hypoplastic and/or absent. Normal anterior communicating artery complex. Anterior cerebral arteries remain patent to their distal aspects without stenosis. Right M1 widely patent. Focal moderate distal left M1 stenosis (series 10, image 22). Normal MCA bifurcations. Distal MCA branches perfused and symmetric. Posterior circulation: Extensive atheromatous disease throughout the V4 segments bilaterally with associated moderate multifocal narrowing on the right, with moderate to severe multifocal narrowing on the left. Distal left V4 segment nearly occludes prior to the vertebrobasilar junction. Right PICA origin remains patent. Left PICA not definitely seen. There is occlusion of the mid basilar artery distally, concerning for occlusive thrombus (series 8, image 110). Perfusion of the distal basilar artery distally, likely collateral. Superior cerebellar arteries perfused and patent bilaterally. Right PCA supplied via the basilar. Multifocal atheromatous irregularity throughout the right PCA without high-grade stenosis. Fetal type origin of the left PCA which remains patent to its distal aspect. Venous sinuses: Patent allowing for timing the contrast bolus. Anatomic variants: Fetal type origin of the left PCA. No visible aneurysm. Review of the MIP images confirms the above findings IMPRESSION: 1. 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 Basilar artery is perfused distally, likely collateral in nature. Both SCAs and PCAs remain patent and perfused at this time. 2. Extensive atheromatous disease throughout the V4 segments  bilaterally with associated moderate to severe multifocal narrowing, left worse than right. 3. Severe atheromatous stenoses of up to 80% at the carotid bifurcations bilaterally. 4. Moderate stenosis at the supraclinoid right ICA, with severe stenosis at the supraclinoid left ICA. 5. Moderate distal left M1 stenosis. 6. Emphysema (ICD10-J43.9). Critical Value/emergent results were called by telephone at the time of interpretation on 03/16/2021 at 12:10 a.m. to provider [facility] , who verbally acknowledged these results. Electronically Signed   By: Jeannine Boga M.D.   On: 03/16/2021 01:43    Labs:  CBC: Recent Labs  03/15/21 2145 03/15/21 2237 03/16/21 0444 03/16/21 0453 03/16/21 1845 03/17/21 0333  WBC 7.1  --   --  5.8 9.7 8.4  HGB 13.9   < > 12.9* 12.0* 12.4* 12.1*  HCT 44.1   < > 38.0* 38.0* 40.0 38.9*  PLT 203  --   --  183 167 171   < > = values in this interval not displayed.    COAGS: Recent Labs    03/16/21 0443  INR 1.1  APTT 113*    BMP: Recent Labs    10/31/20 1610 03/15/21 2145 03/15/21 2237 03/16/21 0015 03/16/21 0444 03/16/21 0453 03/17/21 0333  NA 138 137   < > 141 140 139 139  K 4.7 4.1   < > 4.0 3.6 3.5 4.0  CL 102 105  --  104  --  110 107  CO2 24 23  --   --   --  21* 26  GLUCOSE 78 82  --  69*  --  112* 90  BUN 16 15  --  19  --  12 9  CALCIUM 9.9 8.8*  --   --   --  8.1* 8.5*  CREATININE 1.37* 1.30*  --  1.40*  --  1.13 1.16  GFRNONAA 56* 60*  --   --   --  >60 >60   < > = values in this interval not displayed.    LIVER FUNCTION TESTS: Recent Labs    10/31/20 1610 03/15/21 2145 03/17/21 0333  BILITOT 2.2* 0.8 1.3*  AST 33 22 32  ALT 14 11 11   ALKPHOS 54 47 42  PROT 7.1 6.4* 5.5*  ALBUMIN 3.5 3.4* 2.8*    Assessment and Plan:  70 y/o M who presented to [facility] ED on 11/12 as a code stroke due to being found unresponsive at home. He was found to have right sided weakness, left gaze preference, dysarthric speech and  speech apraxia. 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  Patient A&O x 3, speech appears improved, has some right facial droop and RUE weakness. Right CFA puncture site unremarkable.  No acute NIR needs at this time, further plans per neurology/primary team.  NIR will continue to follow along peripherally.  Electronically Signed: Joaquim Nam, PA-C 03/17/2021, 10:49 AM   I spent a total of 15 Minutes at the the patient's bedside AND on the patient's hospital floor or unit, greater than 50% of which was counseling/coordinating care for code stroke follow up.

## 2021-03-17 NOTE — Progress Notes (Signed)
Inpatient Rehab Admissions Coordinator:   Per therapy recommendations, patient was screened for CIR candidacy by Clemens Catholic, MS, CCC-SLP. At this time, Pt. is not yet tolerating OOB, but  Pt. may have potential to progress to becoming a potential CIR candidate, so CIR admissions team will follow and monitor for progress and participation with therapies and place consult order if Pt. appears to be an appropriate candidate. Please contact me with any questions.   Clemens Catholic, [place], [place] Admissions Coordinator  [phone] ([place]) [phone] (office)

## 2021-03-17 NOTE — Plan of Care (Signed)
  Problem: Activity: Goal: Risk for activity intolerance will decrease Outcome: Progressing    Patient transferred out of bed to chair with two RN assist without complications. Call bell within reach, safety maintained, vitals stable throughout.

## 2021-03-18 LAB — CBC WITH DIFFERENTIAL/PLATELET
Abs Immature Granulocytes: 0.02 10*3/uL (ref 0.00–0.07)
Basophils Absolute: 0 10*3/uL (ref 0.0–0.1)
Basophils Relative: 0 %
Eosinophils Absolute: 0.1 10*3/uL (ref 0.0–0.5)
Eosinophils Relative: 1 %
HCT: 37.8 % — ABNORMAL LOW (ref 39.0–52.0)
Hemoglobin: 12 g/dL — ABNORMAL LOW (ref 13.0–17.0)
Immature Granulocytes: 0 %
Lymphocytes Relative: 17 %
Lymphs Abs: 1.2 10*3/uL (ref 0.7–4.0)
MCH: 30 pg (ref 26.0–34.0)
MCHC: 31.7 g/dL (ref 30.0–36.0)
MCV: 94.5 fL (ref 80.0–100.0)
Monocytes Absolute: 0.7 10*3/uL (ref 0.1–1.0)
Monocytes Relative: 10 %
Neutro Abs: 5 10*3/uL (ref 1.7–7.7)
Neutrophils Relative %: 72 %
Platelets: 158 10*3/uL (ref 150–400)
RBC: 4 MIL/uL — ABNORMAL LOW (ref 4.22–5.81)
RDW: 13.2 % (ref 11.5–15.5)
WBC: 7 10*3/uL (ref 4.0–10.5)
nRBC: 0 % (ref 0.0–0.2)

## 2021-03-18 LAB — BASIC METABOLIC PANEL
Anion gap: 8 (ref 5–15)
BUN: 13 mg/dL (ref 8–23)
CO2: 24 mmol/L (ref 22–32)
Calcium: 8.6 mg/dL — ABNORMAL LOW (ref 8.9–10.3)
Chloride: 104 mmol/L (ref 98–111)
Creatinine, Ser: 1.13 mg/dL (ref 0.61–1.24)
GFR, Estimated: >60 mL/min (ref >60)
Glucose, Bld: 92 mg/dL (ref 70–99)
Potassium: 4 mmol/L (ref 3.5–5.1)
Sodium: 136 mmol/L (ref 135–145)

## 2021-03-18 NOTE — Progress Notes (Signed)
STROKE TEAM PROGRESS NOTE   INTERVAL HISTORY RN is at the bedside. Pt lying in bed, awake alert mild lethargy. Neuro unchanged. Passed swallow on diet. Working with PT/OT.   Vitals:   03/18/21 1000 03/18/21 1100 03/18/21 1200 03/18/21 1300  BP:   113/78   Pulse: 74 83 87 85  Resp: (!) 31 (!) 33 (!) 26 (!) 25  Temp:   98.7 F (37.1 C)   TempSrc:   Oral   SpO2: 96% 94% 94% 92%  Weight:      Height:       CBC:  Recent Labs  Lab 03/17/21 0333 03/18/21 0248  WBC 8.4 7.0  NEUTROABS 6.1 5.0  HGB 12.1* 12.0*  HCT 38.9* 37.8*  MCV 95.3 94.5  PLT 171 301   Basic Metabolic Panel:  Recent Labs  Lab 03/16/21 1229 03/17/21 0333 03/18/21 0248  NA  --  139 136  K  --  4.0 4.0  CL  --  107 104  CO2  --  26 24  GLUCOSE  --  90 92  BUN  --  9 13  CREATININE  --  1.16 1.13  CALCIUM  --  8.5* 8.6*  MG 1.9  --   --    Lipid Panel:  Recent Labs  Lab 03/16/21 0453  CHOL 135  TRIG 49  HDL 71  CHOLHDL 1.9  VLDL 10  LDLCALC 54   HgbA1c:  Recent Labs  Lab 03/16/21 0453  HGBA1C 4.6*   Urine Drug Screen:  Recent Labs  Lab 03/15/21 2133  LABOPIA NONE DETECTED  COCAINSCRNUR POSITIVE*  LABBENZ NONE DETECTED  AMPHETMU NONE DETECTED  THCU NONE DETECTED  LABBARB NONE DETECTED    Alcohol Level  Recent Labs  Lab 03/15/21 2145  ETH 16*    IMAGING past 24 hours DG Abd 1 View  Result Date: 03/16/2021 CLINICAL DATA:  Altered mental status. Metal foreign body in abdomen. EXAM: ABDOMEN - 1 VIEW COMPARISON:  None. FINDINGS: Surgical clips in the right upper quadrant. There is no other metallic foreign body or evidence of implanted medical device in the abdomen. Mild gaseous distention of bowel loops without evidence of obstruction. Included lung bases are clear. Multilevel degenerative change in the spine. No acute osseous abnormalities are seen. IMPRESSION: Surgical clips in the right upper quadrant from cholecystectomy. No other metallic foreign body or evidence of implanted  medical device in the abdomen. Electronically Signed   By: Keith Rake M.D.   On: 03/16/2021 18:38   CT HEAD WO CONTRAST (5MM)  Result Date: 03/16/2021 CLINICAL DATA:  Stroke follow-up.  Basilar stent placement. EXAM: CT HEAD WITHOUT CONTRAST TECHNIQUE: Contiguous axial images were obtained from the base of the skull through the vertex without intravenous contrast. COMPARISON:  CT and CTA from earlier the same day FINDINGS: Brain: Acute infarct is likely present in the left paramedian brainstem and to a small degree in the right occipital cortex. Remote left inferior cerebellar infarction. Chronic lacunar infarcts. No hemorrhage, hydrocephalus, or collection. Vascular: Atheromatous calcification and mid basilar stent. Skull: Negative Sinuses/Orbits: Negative IMPRESSION: Acute infarct likely in the brainstem and right occipital cortex. No acute hemorrhage. Electronically Signed   By: Jorje Guild M.D.   On: 03/16/2021 06:58   CT HEAD WO CONTRAST  Result Date: 03/15/2021 CLINICAL DATA:  Mental status change, neck trauma. EXAM: CT HEAD WITHOUT CONTRAST CT CERVICAL SPINE WITHOUT CONTRAST TECHNIQUE: Multidetector CT imaging of the head and cervical spine was performed following the  standard protocol without intravenous contrast. Multiplanar CT image reconstructions of the cervical spine were also generated. COMPARISON:  CT head and cervical spine 08/30/2017 FINDINGS: CT HEAD FINDINGS Brain: Chronic left cerebellar and left basal ganglia infarction. No evidence of large-territorial acute infarction. No parenchymal hemorrhage. No mass lesion. No extra-axial collection. No mass effect or midline shift. No hydrocephalus. Basilar cisterns are patent. Vascular: No hyperdense vessel. Atherosclerotic calcifications are present within the cavernous internal carotid arteries. Skull: No acute fracture or focal lesion. Sinuses/Orbits: Paranasal sinuses and mastoid air cells are clear. The orbits are unremarkable.  Other: None. CT CERVICAL SPINE FINDINGS Alignment: Stable grade 1 anterolisthesis of C2 on C3 and C7 on T1. Skull base and vertebrae: Multilevel severe degenerative changes of the spine. Associated multilevel severe neural foraminal stenosis. No severe osseous central canal stenosis. No acute fracture. No aggressive appearing focal osseous lesion or focal pathologic process. Soft tissues and spinal canal: No prevertebral fluid or swelling. No visible canal hematoma. Upper chest: Centrilobular emphysematous changes. Other: None. IMPRESSION: 1. No acute intracranial abnormality. 2. No acute displaced fracture or traumatic listhesis of the cervical spine in a patient with multilevel degenerative change of the spine leading to severe osseous neural foraminal stenosis. 3.  Emphysema (ICD10-J43.9). Electronically Signed   By: Iven Finn M.D.   On: 03/15/2021 22:35   CT CERVICAL SPINE WO CONTRAST  Result Date: 03/15/2021 CLINICAL DATA:  Mental status change, neck trauma. EXAM: CT HEAD WITHOUT CONTRAST CT CERVICAL SPINE WITHOUT CONTRAST TECHNIQUE: Multidetector CT imaging of the head and cervical spine was performed following the standard protocol without intravenous contrast. Multiplanar CT image reconstructions of the cervical spine were also generated. COMPARISON:  CT head and cervical spine 08/30/2017 FINDINGS: CT HEAD FINDINGS Brain: Chronic left cerebellar and left basal ganglia infarction. No evidence of large-territorial acute infarction. No parenchymal hemorrhage. No mass lesion. No extra-axial collection. No mass effect or midline shift. No hydrocephalus. Basilar cisterns are patent. Vascular: No hyperdense vessel. Atherosclerotic calcifications are present within the cavernous internal carotid arteries. Skull: No acute fracture or focal lesion. Sinuses/Orbits: Paranasal sinuses and mastoid air cells are clear. The orbits are unremarkable. Other: None. CT CERVICAL SPINE FINDINGS Alignment: Stable grade 1  anterolisthesis of C2 on C3 and C7 on T1. Skull base and vertebrae: Multilevel severe degenerative changes of the spine. Associated multilevel severe neural foraminal stenosis. No severe osseous central canal stenosis. No acute fracture. No aggressive appearing focal osseous lesion or focal pathologic process. Soft tissues and spinal canal: No prevertebral fluid or swelling. No visible canal hematoma. Upper chest: Centrilobular emphysematous changes. Other: None. IMPRESSION: 1. No acute intracranial abnormality. 2. No acute displaced fracture or traumatic listhesis of the cervical spine in a patient with multilevel degenerative change of the spine leading to severe osseous neural foraminal stenosis. 3.  Emphysema (ICD10-J43.9). Electronically Signed   By: Iven Finn M.D.   On: 03/15/2021 22:35   MR ANGIO HEAD WO CONTRAST  Result Date: 03/17/2021 CLINICAL DATA:  Follow-up examination for acute stroke. EXAM: MRI HEAD WITHOUT CONTRAST MRA HEAD WITHOUT CONTRAST TECHNIQUE: Multiplanar, multi-echo pulse sequences of the brain and surrounding structures were acquired without intravenous contrast. Angiographic images of the Circle of Willis were acquired using MRA technique without intravenous contrast. COMPARISON:  Prior studies from 03/16/2021. FINDINGS: MRI HEAD FINDINGS Brain: Moderately advanced cerebral atrophy. Patchy T2/FLAIR hyperintensity within the periventricular and deep white matter both cerebral hemispheres most consistent with chronic small vessel ischemic disease, moderate in nature. Multiple scattered remote  lacunar infarcts present about the hemispheric cerebral white matter, basal ganglia, and thalami. Chronic left greater than right bilateral cerebellar infarcts. Few scattered chronic micro hemorrhages noted about the thalami, likely related to chronic poorly controlled hypertension. Patchy small volume acute ischemic infarcts seen involving the peripheral right cerebellum (series 5, image  54), pons (series 5, image 61), right occipital lobe (series 5, images 65, 70, 75), and right thalamus (series 5, image 73). No associated hemorrhage or mass effect. No other evidence for acute or subacute ischemia. No acute intracranial hemorrhage. No mass lesion, mass effect or midline shift. No hydrocephalus or extra-axial fluid collection. Pituitary gland suprasellar region within normal limits. Midline structures intact. Vascular: Major intracranial vascular flow voids are maintained. Skull and upper cervical spine: Craniocervical junction within normal limits. Bone marrow signal intensity somewhat heterogeneous without focal marrow replacing lesion. No scalp soft tissue abnormality. Sinuses/Orbits: Globes and orbital soft tissues within normal limits. Scattered mucosal thickening noted within the ethmoidal air cells. Paranasal sinuses are otherwise clear. Trace right mastoid effusion noted, of doubtful significance. Inner ear structures grossly normal. Other: Chronic cystic lesion involving the subcutaneous fat of the lower left face again noted, of doubtful significance. MRA HEAD FINDINGS Anterior circulation: Examination mildly degraded by motion. Visualized distal cervical segments of the internal carotid arteries are patent with antegrade flow. Petrous and cavernous segments patent bilaterally. Moderate stenosis at the supraclinoid left ICA (series 1047, image 19). No significant narrowing about the contralateral supraclinoid right ICA by MRA. Right A1 patent. Left A1 hypoplastic and/or absent, accounting for the diminutive left ICA is compared to the right. Normal anterior communicating artery complex. Anterior cerebral arteries patent without stenosis. M1 segments patent without definite stenosis. Irregularity at the mid left M1 on MIP reconstructions felt to be consistent with motion artifact. No proximal MCA branch occlusion. Distal MCA branches perfused and symmetric. Posterior circulation:  Atheromatous irregularity within the V4 segments bilaterally without hemodynamically significant stenosis by MRA. Right vertebral artery slightly dominant. Right PICA patent at its origin. Left PICA not seen. Interval placement of a vascular stent within the basilar artery, extending from the level of the AICAs distally. Grossly patent flow through the stent, although this is not well assessed by MRA. AICA remain perfused. Patent flow seen distally within the basilar artery. Superior cerebral arteries patent bilaterally. Right PCA supplied via the basilar. Fetal type origin left PCA. PCAs remain patent to their distal aspects without high-grade stenosis. Anatomic variants: Fetal type origin of the left PCA. Hypoplastic/absent left A1. IMPRESSION: MRI HEAD IMPRESSION: 1. Patchy small volume acute ischemic infarcts involving the right cerebellum, pons, right occipital lobe, and right thalamus as above. No associated hemorrhage or mass effect. 2. Underlying chronic microvascular ischemic disease with multiple remote lacunar infarcts about the hemispheric cerebral white matter, deep gray nuclei, and cerebellum. MRA HEAD IMPRESSION: 1. Interval stenting of the basilar artery. Grossly patent flow through the stent, with good perfusion distally. 2. Moderate stenosis at the supraclinoid left ICA. 3. Additional mild intracranial atherosclerotic disease. No other hemodynamically significant stenosis by MRA. Electronically Signed   By: Jeannine Boga M.D.   On: 03/17/2021 04:56   MR BRAIN WO CONTRAST  Result Date: 03/17/2021 CLINICAL DATA:  Follow-up examination for acute stroke. EXAM: MRI HEAD WITHOUT CONTRAST MRA HEAD WITHOUT CONTRAST TECHNIQUE: Multiplanar, multi-echo pulse sequences of the brain and surrounding structures were acquired without intravenous contrast. Angiographic images of the Circle of Willis were acquired using MRA technique without intravenous contrast. COMPARISON:  Prior  studies from  03/16/2021. FINDINGS: MRI HEAD FINDINGS Brain: Moderately advanced cerebral atrophy. Patchy T2/FLAIR hyperintensity within the periventricular and deep white matter both cerebral hemispheres most consistent with chronic small vessel ischemic disease, moderate in nature. Multiple scattered remote lacunar infarcts present about the hemispheric cerebral white matter, basal ganglia, and thalami. Chronic left greater than right bilateral cerebellar infarcts. Few scattered chronic micro hemorrhages noted about the thalami, likely related to chronic poorly controlled hypertension. Patchy small volume acute ischemic infarcts seen involving the peripheral right cerebellum (series 5, image 54), pons (series 5, image 61), right occipital lobe (series 5, images 65, 70, 75), and right thalamus (series 5, image 73). No associated hemorrhage or mass effect. No other evidence for acute or subacute ischemia. No acute intracranial hemorrhage. No mass lesion, mass effect or midline shift. No hydrocephalus or extra-axial fluid collection. Pituitary gland suprasellar region within normal limits. Midline structures intact. Vascular: Major intracranial vascular flow voids are maintained. Skull and upper cervical spine: Craniocervical junction within normal limits. Bone marrow signal intensity somewhat heterogeneous without focal marrow replacing lesion. No scalp soft tissue abnormality. Sinuses/Orbits: Globes and orbital soft tissues within normal limits. Scattered mucosal thickening noted within the ethmoidal air cells. Paranasal sinuses are otherwise clear. Trace right mastoid effusion noted, of doubtful significance. Inner ear structures grossly normal. Other: Chronic cystic lesion involving the subcutaneous fat of the lower left face again noted, of doubtful significance. MRA HEAD FINDINGS Anterior circulation: Examination mildly degraded by motion. Visualized distal cervical segments of the internal carotid arteries are patent with  antegrade flow. Petrous and cavernous segments patent bilaterally. Moderate stenosis at the supraclinoid left ICA (series 1047, image 19). No significant narrowing about the contralateral supraclinoid right ICA by MRA. Right A1 patent. Left A1 hypoplastic and/or absent, accounting for the diminutive left ICA is compared to the right. Normal anterior communicating artery complex. Anterior cerebral arteries patent without stenosis. M1 segments patent without definite stenosis. Irregularity at the mid left M1 on MIP reconstructions felt to be consistent with motion artifact. No proximal MCA branch occlusion. Distal MCA branches perfused and symmetric. Posterior circulation: Atheromatous irregularity within the V4 segments bilaterally without hemodynamically significant stenosis by MRA. Right vertebral artery slightly dominant. Right PICA patent at its origin. Left PICA not seen. Interval placement of a vascular stent within the basilar artery, extending from the level of the AICAs distally. Grossly patent flow through the stent, although this is not well assessed by MRA. AICA remain perfused. Patent flow seen distally within the basilar artery. Superior cerebral arteries patent bilaterally. Right PCA supplied via the basilar. Fetal type origin left PCA. PCAs remain patent to their distal aspects without high-grade stenosis. Anatomic variants: Fetal type origin of the left PCA. Hypoplastic/absent left A1. IMPRESSION: MRI HEAD IMPRESSION: 1. Patchy small volume acute ischemic infarcts involving the right cerebellum, pons, right occipital lobe, and right thalamus as above. No associated hemorrhage or mass effect. 2. Underlying chronic microvascular ischemic disease with multiple remote lacunar infarcts about the hemispheric cerebral white matter, deep gray nuclei, and cerebellum. MRA HEAD IMPRESSION: 1. Interval stenting of the basilar artery. Grossly patent flow through the stent, with good perfusion distally. 2.  Moderate stenosis at the supraclinoid left ICA. 3. Additional mild intracranial atherosclerotic disease. No other hemodynamically significant stenosis by MRA. Electronically Signed   By: Jeannine Boga M.D.   On: 03/17/2021 04:56   DG CHEST PORT 1 VIEW  Result Date: 03/17/2021 CLINICAL DATA:  70 year old male with history of respiratory failure.  EXAM: PORTABLE CHEST 1 VIEW COMPARISON:  Chest x-ray 03/16/2021. FINDINGS: Previously noted endotracheal and nasogastric tubes have been removed. Lung volumes are normal. No consolidative airspace disease. No pleural effusions. No pneumothorax. No pulmonary nodule or mass noted. Pulmonary vasculature and the cardiomediastinal silhouette are within normal limits. Multiple old right-sided posterolateral rib fractures are again noted. IMPRESSION: 1.  No radiographic evidence of acute cardiopulmonary disease. Electronically Signed   By: Vinnie Langton M.D.   On: 03/17/2021 08:07   DG CHEST PORT 1 VIEW  Result Date: 03/16/2021 CLINICAL DATA:  Intubation an orogastric tube placement. EXAM: PORTABLE CHEST 1 VIEW COMPARISON:  02/12/2021. FINDINGS: The heart size and mediastinal contours are within normal limits. Both lungs are clear. Old healed rib fractures are noted bilaterally. An enteric tube courses over the left upper quadrant and out of the field of view. The side port projects over the anticipated region of the stomach. The endotracheal tube terminates 3.3 cm above the carina. IMPRESSION: 1. No acute cardiopulmonary process. 2. Medical devices as described above. Electronically Signed   By: Brett Fairy M.D.   On: 03/16/2021 04:29   DG Chest Port 1 View  Result Date: 03/15/2021 CLINICAL DATA:  Question aspiration. Found on ground at side of apartment. Gurgling respirations. EXAM: PORTABLE CHEST 1 VIEW COMPARISON:  Radiographs 10/29/2020 FINDINGS: The cardiomediastinal contours are normal. The lungs are clear. Pulmonary vasculature is normal. No  consolidation, pleural effusion, or pneumothorax. Right posterior rib fractures are remote, they were acute on prior exam. There also remote left rib fractures. No acute osseous abnormalities are seen. IMPRESSION: 1. No acute chest findings. No radiographic findings to suggest aspiration. 2. Remote bilateral rib fractures. Electronically Signed   By: Keith Rake M.D.   On: 03/15/2021 22:08   ECHOCARDIOGRAM COMPLETE  Result Date: 03/16/2021    ECHOCARDIOGRAM REPORT   Patient Name:   HARTLEY URTON Date of Exam: 03/16/2021 Medical Rec #:  [id number]          Height:       72.0 in Accession #:    [id number]         Weight:       160.1 lb Date of Birth:  April 06, 1951          BSA:          1.938 m Patient Age:    29 years           BP:           112/85 mmHg Patient Gender: M                  HR:           84 bpm. Exam Location:  Inpatient Procedure: 2D Echo, Cardiac Doppler and Color Doppler Indications:    Stroke  History:        Patient has prior history of Echocardiogram examinations. Risk                 Factors:Hypertension.  Sonographer:    Jyl Heinz Referring Phys: [id number] [place]  1. Left ventricular ejection fraction, by estimation, is 50 to 55%. The left ventricle has low normal function. The left ventricle has no regional wall motion abnormalities. There is mild left ventricular hypertrophy. Left ventricular diastolic parameters are indeterminate.  2. Right ventricular systolic function is normal. The right ventricular size is normal. There is normal pulmonary artery systolic pressure. The estimated right ventricular systolic pressure is 42.7 mmHg.  3.  The mitral valve is abnormal. Trivial mitral valve regurgitation.  4. The aortic valve is tricuspid. Aortic valve regurgitation is trivial. Aortic valve sclerosis/calcification is present, without any evidence of aortic stenosis.  5. The inferior vena cava is normal in size with <50% respiratory variability, suggesting right  atrial pressure of 8 mmHg. Comparison(s): No prior Echocardiogram. FINDINGS  Left Ventricle: Left ventricular ejection fraction, by estimation, is 50 to 55%. The left ventricle has low normal function. The left ventricle has no regional wall motion abnormalities. The left ventricular internal cavity size was normal in size. There is mild left ventricular hypertrophy. Left ventricular diastolic parameters are indeterminate. Right Ventricle: The right ventricular size is normal. No increase in right ventricular wall thickness. Right ventricular systolic function is normal. There is normal pulmonary artery systolic pressure. The tricuspid regurgitant velocity is 2.23 m/s, and  with an assumed right atrial pressure of 8 mmHg, the estimated right ventricular systolic pressure is 91.6 mmHg. Left Atrium: Left atrial size was normal in size. Right Atrium: Right atrial size was normal in size. Pericardium: There is no evidence of pericardial effusion. Mitral Valve: The mitral valve is abnormal. There is mild thickening of the mitral valve leaflet(s). Trivial mitral valve regurgitation. Tricuspid Valve: The tricuspid valve is grossly normal. Tricuspid valve regurgitation is mild. Aortic Valve: The aortic valve is tricuspid. There is mild aortic valve annular calcification. Aortic valve regurgitation is trivial. Aortic valve sclerosis/calcification is present, without any evidence of aortic stenosis. Aortic valve peak gradient measures 9.6 mmHg. Pulmonic Valve: The pulmonic valve was grossly normal. Pulmonic valve regurgitation is trivial. Aorta: The aortic root is normal in size and structure. Venous: The inferior vena cava is normal in size with less than 50% respiratory variability, suggesting right atrial pressure of 8 mmHg. IAS/Shunts: No atrial level shunt detected by color flow Doppler.  LEFT VENTRICLE PLAX 2D LVIDd:         4.70 cm     Diastology LVIDs:         3.30 cm     LV e' medial:    6.74 cm/s LV PW:         1.10  cm     LV E/e' medial:  8.1 LV IVS:        1.10 cm     LV e' lateral:   6.09 cm/s LVOT diam:     2.60 cm     LV E/e' lateral: 9.0 LV SV:         89 LV SV Index:   46 LVOT Area:     5.31 cm  LV Volumes (MOD) LV vol d, MOD A2C: 97.4 ml LV vol d, MOD A4C: 86.4 ml LV vol s, MOD A2C: 46.6 ml LV vol s, MOD A4C: 41.6 ml LV SV MOD A2C:     50.8 ml LV SV MOD A4C:     86.4 ml LV SV MOD BP:      48.6 ml RIGHT VENTRICLE             IVC RV Basal diam:  3.30 cm     IVC diam: 2.00 cm RV Mid diam:    2.40 cm RV S prime:     12.00 cm/s TAPSE (M-mode): 2.1 cm LEFT ATRIUM           Index        RIGHT ATRIUM           Index LA diam:      3.10 cm  1.60 cm/m   RA Area:     10.40 cm LA Vol (A2C): 12.0 ml 6.19 ml/m   RA Volume:   19.80 ml  10.22 ml/m LA Vol (A4C): 53.5 ml 27.60 ml/m  AORTIC VALVE AV Area (Vmax): 3.23 cm AV Vmax:        155.00 cm/s AV Peak Grad:   9.6 mmHg LVOT Vmax:      94.20 cm/s LVOT Vmean:     71.400 cm/s LVOT VTI:       0.167 m  AORTA Ao Root diam: 3.20 cm MITRAL VALVE               TRICUSPID VALVE MV Area (PHT): 3.91 cm    TR Peak grad:   19.9 mmHg MV Decel Time: 194 msec    TR Vmax:        223.00 cm/s MV E velocity: 54.70 cm/s MV A velocity: 68.90 cm/s  SHUNTS MV E/A ratio:  0.79        Systemic VTI:  0.17 m                            Systemic Diam: 2.60 cm Rozann Lesches MD Electronically signed by Rozann Lesches MD Signature Date/Time: 03/16/2021/12:07:57 PM    Final    CT ANGIO HEAD CODE STROKE  Result Date: 03/16/2021 CLINICAL DATA:  Initial evaluation for acute stroke. EXAM: CT ANGIOGRAPHY HEAD AND NECK TECHNIQUE: Multidetector CT imaging of the head and neck was performed using the standard protocol during bolus administration of intravenous contrast. Multiplanar CT image reconstructions and MIPs were obtained to evaluate the vascular anatomy. Carotid stenosis measurements (when applicable) are obtained utilizing NASCET criteria, using the distal internal carotid diameter as the denominator.  CONTRAST:  72mL OMNIPAQUE IOHEXOL 350 MG/ML SOLN COMPARISON:  CT from 03/15/2021 as well as prior CTA from 05/04/2017. FINDINGS: CTA NECK FINDINGS Aortic arch: Partially visualized aortic arch in origin of the great vessels grossly normal in caliber. No visible stenosis about the origin of the great vessels. Right carotid system: Right CCA patent from its origin to the bifurcation without stenosis. Bulky calcified plaque at the right carotid bulb with associated stenosis of up to 80% by NASCET criteria (series 7, image 222). Right ICA patent distally without stenosis, dissection or occlusion. Left carotid system: Left CCA patent from its origin to the bifurcation without stenosis. Eccentric mixed plaque at the left carotid bulb with associated stenosis of up to 80% by NASCET criteria (series 7, image 229). Left ICA patent distally without stenosis dissection or occlusion. Vertebral arteries: Both vertebral arteries arise from the subclavian arteries. No visible proximal subclavian artery stenosis. Right vertebral artery slightly dominant. Vertebral arteries patent within the neck without stenosis dissection or occlusion. Skeleton: No discrete or worrisome osseous lesions. Moderate to advanced multilevel spondylosis noted throughout the cervical spine. Postsurgical changes noted at the mandible. Other neck: Multiple hypodense/cystic lesions noted within the subcutaneous fat of the upper face/neck, greater on the left. The largest of these seen on the left and measures 2.8 cm in size (series 5, image 97). Few small foci of gas seen within this lesion. Additional lesion seen slightly inferiorly (series 5, image 112). Focal calcifications seen within a 1.2 cm lesion on the right (series 5, image 115). Findings are most likely benign in reflect sebaceous cysts, and are similar as compared to 2018. No other acute soft tissue abnormality within the neck. Upper chest: Emphysema. Visualized upper  chest demonstrates no other  acute finding. Review of the MIP images confirms the above findings CTA HEAD FINDINGS Anterior circulation: Petrous segments patent bilaterally. Atheromatous plaque within the carotid siphons bilaterally with associated moderate stenosis at the supraclinoid right ICA (series 7, image 110). Focal severe stenosis seen at the contralateral supraclinoid left ICA (series 7, image 113). Right A1 patent. Left A1 hypoplastic and/or absent. Normal anterior communicating artery complex. Anterior cerebral arteries remain patent to their distal aspects without stenosis. Right M1 widely patent. Focal moderate distal left M1 stenosis (series 10, image 22). Normal MCA bifurcations. Distal MCA branches perfused and symmetric. Posterior circulation: Extensive atheromatous disease throughout the V4 segments bilaterally with associated moderate multifocal narrowing on the right, with moderate to severe multifocal narrowing on the left. Distal left V4 segment nearly occludes prior to the vertebrobasilar junction. Right PICA origin remains patent. Left PICA not definitely seen. There is occlusion of the mid basilar artery distally, concerning for occlusive thrombus (series 8, image 110). Perfusion of the distal basilar artery distally, likely collateral. Superior cerebellar arteries perfused and patent bilaterally. Right PCA supplied via the basilar. Multifocal atheromatous irregularity throughout the right PCA without high-grade stenosis. Fetal type origin of the left PCA which remains patent to its distal aspect. Venous sinuses: Patent allowing for timing the contrast bolus. Anatomic variants: Fetal type origin of the left PCA. No visible aneurysm. Review of the MIP images confirms the above findings IMPRESSION: 1. 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 Basilar artery is perfused distally, likely collateral in nature. Both SCAs and PCAs remain patent and perfused at this time. 2. Extensive atheromatous disease throughout the  V4 segments bilaterally with associated moderate to severe multifocal narrowing, left worse than right. 3. Severe atheromatous stenoses of up to 80% at the carotid bifurcations bilaterally. 4. Moderate stenosis at the supraclinoid right ICA, with severe stenosis at the supraclinoid left ICA. 5. Moderate distal left M1 stenosis. 6. Emphysema (ICD10-J43.9). Critical Value/emergent results were called by telephone at the time of interpretation on 03/16/2021 at 12:10 a.m. to provider [facility] , who verbally acknowledged these results. Electronically Signed   By: Jeannine Boga M.D.   On: 03/16/2021 01:43   CT ANGIO NECK CODE STROKE  Result Date: 03/16/2021 CLINICAL DATA:  Initial evaluation for acute stroke. EXAM: CT ANGIOGRAPHY HEAD AND NECK TECHNIQUE: Multidetector CT imaging of the head and neck was performed using the standard protocol during bolus administration of intravenous contrast. Multiplanar CT image reconstructions and MIPs were obtained to evaluate the vascular anatomy. Carotid stenosis measurements (when applicable) are obtained utilizing NASCET criteria, using the distal internal carotid diameter as the denominator. CONTRAST:  34mL OMNIPAQUE IOHEXOL 350 MG/ML SOLN COMPARISON:  CT from 03/15/2021 as well as prior CTA from 05/04/2017. FINDINGS: CTA NECK FINDINGS Aortic arch: Partially visualized aortic arch in origin of the great vessels grossly normal in caliber. No visible stenosis about the origin of the great vessels. Right carotid system: Right CCA patent from its origin to the bifurcation without stenosis. Bulky calcified plaque at the right carotid bulb with associated stenosis of up to 80% by NASCET criteria (series 7, image 222). Right ICA patent distally without stenosis, dissection or occlusion. Left carotid system: Left CCA patent from its origin to the bifurcation without stenosis. Eccentric mixed plaque at the left carotid bulb with associated stenosis of up to 80% by  NASCET criteria (series 7, image 229). Left ICA patent distally without stenosis dissection or occlusion. Vertebral arteries: Both vertebral arteries  arise from the subclavian arteries. No visible proximal subclavian artery stenosis. Right vertebral artery slightly dominant. Vertebral arteries patent within the neck without stenosis dissection or occlusion. Skeleton: No discrete or worrisome osseous lesions. Moderate to advanced multilevel spondylosis noted throughout the cervical spine. Postsurgical changes noted at the mandible. Other neck: Multiple hypodense/cystic lesions noted within the subcutaneous fat of the upper face/neck, greater on the left. The largest of these seen on the left and measures 2.8 cm in size (series 5, image 97). Few small foci of gas seen within this lesion. Additional lesion seen slightly inferiorly (series 5, image 112). Focal calcifications seen within a 1.2 cm lesion on the right (series 5, image 115). Findings are most likely benign in reflect sebaceous cysts, and are similar as compared to 2018. No other acute soft tissue abnormality within the neck. Upper chest: Emphysema. Visualized upper chest demonstrates no other acute finding. Review of the MIP images confirms the above findings CTA HEAD FINDINGS Anterior circulation: Petrous segments patent bilaterally. Atheromatous plaque within the carotid siphons bilaterally with associated moderate stenosis at the supraclinoid right ICA (series 7, image 110). Focal severe stenosis seen at the contralateral supraclinoid left ICA (series 7, image 113). Right A1 patent. Left A1 hypoplastic and/or absent. Normal anterior communicating artery complex. Anterior cerebral arteries remain patent to their distal aspects without stenosis. Right M1 widely patent. Focal moderate distal left M1 stenosis (series 10, image 22). Normal MCA bifurcations. Distal MCA branches perfused and symmetric. Posterior circulation: Extensive atheromatous disease  throughout the V4 segments bilaterally with associated moderate multifocal narrowing on the right, with moderate to severe multifocal narrowing on the left. Distal left V4 segment nearly occludes prior to the vertebrobasilar junction. Right PICA origin remains patent. Left PICA not definitely seen. There is occlusion of the mid basilar artery distally, concerning for occlusive thrombus (series 8, image 110). Perfusion of the distal basilar artery distally, likely collateral. Superior cerebellar arteries perfused and patent bilaterally. Right PCA supplied via the basilar. Multifocal atheromatous irregularity throughout the right PCA without high-grade stenosis. Fetal type origin of the left PCA which remains patent to its distal aspect. Venous sinuses: Patent allowing for timing the contrast bolus. Anatomic variants: Fetal type origin of the left PCA. No visible aneurysm. Review of the MIP images confirms the above findings IMPRESSION: 1. 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 Basilar artery is perfused distally, likely collateral in nature. Both SCAs and PCAs remain patent and perfused at this time. 2. Extensive atheromatous disease throughout the V4 segments bilaterally with associated moderate to severe multifocal narrowing, left worse than right. 3. Severe atheromatous stenoses of up to 80% at the carotid bifurcations bilaterally. 4. Moderate stenosis at the supraclinoid right ICA, with severe stenosis at the supraclinoid left ICA. 5. Moderate distal left M1 stenosis. 6. Emphysema (ICD10-J43.9). Critical Value/emergent results were called by telephone at the time of interpretation on 03/16/2021 at 12:10 a.m. to provider [facility] , who verbally acknowledged these results. Electronically Signed   By: Jeannine Boga M.D.   On: 03/16/2021 01:43     PHYSICAL EXAM  Temp:  [98.5 F (36.9 C)-99.5 F (37.5 C)] 98.7 F (37.1 C) (11/14 1200) Pulse Rate:  [74-95] 85 (11/14 1300) Resp:  [20-37]  25 (11/14 1300) BP: (113-156)/(66-85) 113/78 (11/14 1200) SpO2:  [92 %-100 %] 92 % (11/14 1300)  General - Well nourished, well developed, in no apparent distress.  Ophthalmologic - fundi not visualized due to noncooperation.  Cardiovascular - Regular rhythm and  rate.  Neuro - awake, alert, eyes open, orientated to age, place, time. No aphasia, however moderate to severe dysarthria, following all simple commands. No gaze palsy, tracking bilaterally, visual field full, PERRL, no nystagmus or disconjugate eyes. Mild right facial droop. Tongue midline. LUE and LLE at least 4/5, RUE 3-/5 proximal and distally. RLE 3/5 proximal and 3+/5 distally. Sensation decreased on the left, left FTN ataxia and HTS dysmetria. R HTS dysmetria but seems not out of proportion to the weakness, gait not tested.     ASSESSMENT/PLAN Mr. JYRON TURMAN is a 70 y.o. male with history of hypertension, alcohol abuse, smoker, substance abuse, history of stroke, admitted for unresponsiveness, right-sided weakness, left gaze, slurred speech and left facial droop after alcohol abuse.  CT no acute abnormality, CT head and neck mid basilar artery occlusion.  Status post IR with TICI3 and basilar artery stenting.  Stroke: b/l pontine and right PCA scattered infarct secondary to progressive large vessel disease with persistent cocaine abuse, smoker and alcohol abuse Code Stroke CT head No acute abnormality.    CTA head & neck Acute occlusion of mid basilar artery, atheromatous disease in V4 segments with multfocal narrowing, left worse than right, 80% atheromatous stenoses at carotid bifurcations, moderate stenosis of supraclinoid right ICA, severe stenosis of supraclinoid left ICA, moderate distal left M1 stenosis Post IR CT acute infarct in brainstem and right occipital cortex MRI small volume acute ischemic infarcts in right cerebellum, pons, right occipital lobe and right thalamus with underlying chronic microvascular  ischemic disease MRA Patent flow through basilar artery stent and moderate stenosis of supraclinoid left ICA 2D Echo EF 50-55% LDL 54 HgbA1c 4.6 UDS positive for cocaine VTE prophylaxis - Lovenox aspirin 81 mg daily and clopidogrel 75 mg daily prior to admission, now on aspirin 81 mg daily and Brilinta (ticagrelor) 90 mg bid.  Therapy recommendations:  CIR Disposition:  pending  History of stroke 05/2015, MRI showed right pontine infarct, remote right BG and cerebellum infarct.  CTA head and neck showed right ICA 60% stenosis, left ICA 50% stenosis, bilateral ICA siphon, VA and BA stenosis.  EF 50 to 55%, LDL 52, UDS positive for cocaine.  Discharged with DAPT and Zocor 04/2017 admitted for right-sided weakness, slurred speech and right facial droop.  MRI showed left pontine infarct.  CT head and neck bilateral ICA bulb 30 to 40% stenosis.  Severe left siphon stenosis, basilar artery stenosis.  EF 60 to 65%.  A1c 4.6, LDL 45, UDS positive for cocaine.  Discharged with DAPT and Lipitor 80.  Progressive intracranial vascular stenosis 05/2015, CT head and neck showed bilateral VAs and BA stenosis, however BA patent 04/2017 CTA head and neck showed similar bilateral VAs and BA stenosis but patent Current admission CTA head and neck showed progressive worsening bilateral V4 stenosis and atherosclerosis.  BA occlusion.  Hypertension Home meds:  none BP goal < 180/105 now Stable off Cleviprex On amlodipine 10 Long-term BP goal normotensive  Hyperlipidemia Home meds:  atorvastatin 80 mg daily, resumed in hospital LDL 54, goal < 70 High intensity statin continued Continue statin at discharge  Cocaine abuse UDS positive for cocaine, patient also used cocaine prior to previous strokes. Social work consult for substance abuse cessation assistance Cessation education will be provided  Tobacco abuse Current smoker Smoking cessation counseling will be provided  Alcohol abuse On CIWA  protocol FA/MVI/B1 Education on alcohol Limited use will be provided ETOH use, alcohol level 16, will advise to drink no more  than 2 drink(s) a day  Other Stroke Risk Factors Advanced Age >/= 56   Other Active Problems    Hospital day # 2   Rosalin Hawking, MD PhD Stroke Neurology 03/18/2021 1:54 PM    To contact Stroke Continuity provider, please refer to [email or web address] After hours, contact General Neurology
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
## 2021-03-18 NOTE — Progress Notes (Signed)
Referring Physician(s): * No referring provider recorded for this case *  Supervising Physician: Luanne Bras  Patient Status:  [facility] - In-pt  Chief Complaint: S/p bilateral vertebral artery angiograms with complete revascularization of occluded basilar artery by aspiration. ICAD stenosis treated with stent assisted angioplasty with revascularization.   Subjective:  Pt sitting upright in bed watching TV. He is able to name the show he is watching, place, situation, year and month correctly. He denies HA and states he is eating w/o difficulty.   Allergies: Patient has no known allergies.  Medications: Prior to Admission medications   Medication Sig Start Date End Date Taking? Authorizing Provider  oxyCODONE-acetaminophen (PERCOCET/ROXICET) 5-325 MG tablet Take 1 tablet by mouth every 6 (six) hours as needed for severe pain.   Yes [provider]  aspirin 81 MG EC tablet Take 1 tablet (81 mg total) by mouth daily. Patient not taking: Reported on 03/17/2021 06/04/17   Kalman Shan Ratliff, DO  atorvastatin (LIPITOR) 80 MG tablet Take 1 tablet (80 mg total) by mouth daily at 6 PM. Patient not taking: Reported on 03/17/2021 06/04/17   Kalman Shan Ratliff, DO  clopidogrel (PLAVIX) 75 MG tablet Take 1 tablet (75 mg total) by mouth daily. Patient not taking: Reported on 03/17/2021 06/04/17   Kalman Shan Ratliff, DO  polyethylene glycol (MIRALAX / GLYCOLAX) 17 g packet Take 17 g by mouth daily. Patient not taking: Reported on 03/17/2021 10/14/18   Carroll Sage, MD  senna-docusate (SENOKOT-S) 8.6-50 MG tablet Take 1 tablet by mouth at bedtime as needed for mild constipation. Patient not taking: Reported on 03/17/2021 10/13/18   Carroll Sage, MD     Vital Signs: BP 113/78 (BP Location: Left Arm)   Pulse 87   Temp 98.7 F (37.1 C) (Oral)   Resp (!) 31   Ht 6' (1.829 m)   Wt 160 lb 0.9 oz (72.6 kg)   SpO2 92%   BMI 21.71 kg/m   Physical  Exam Constitutional:      Appearance: Normal appearance. He is ill-appearing.  HENT:     Head: Normocephalic and atraumatic.  Cardiovascular:     Rate and Rhythm: Normal rate.  Pulmonary:     Effort: Pulmonary effort is normal. No respiratory distress.  Musculoskeletal:     Right lower leg: No edema.     Left lower leg: No edema.  Skin:    General: Skin is warm and dry.     Comments: Right FA puncture site is soft with no bleeding, redness or hematoma.   Neurological:     Mental Status: He is alert and oriented to person, place, and time.     Comments: Alert, aware and oriented X 3 Speech and comprehension is intact.  No facial droop noted Can spontaneously move all 4 extremities.  Right pronator drift noted Dorsiflexion 5/5 bilaterally. Plantar flection 5/5 bilaterally.  Fine motor and coordination grossly in tact Gait not assessed Romberg not assessed Heel to toe not assessed Distal pulses not assessed   Psychiatric:        Mood and Affect: Mood normal.        Behavior: Behavior normal.        Thought Content: Thought content normal.        Judgment: Judgment normal.    Imaging: DG Abd 1 View  Result Date: 03/16/2021 CLINICAL DATA:  Altered mental status. Metal foreign body in abdomen. EXAM: ABDOMEN - 1 VIEW COMPARISON:  None. FINDINGS: Surgical clips  in the right upper quadrant. There is no other metallic foreign body or evidence of implanted medical device in the abdomen. Mild gaseous distention of bowel loops without evidence of obstruction. Included lung bases are clear. Multilevel degenerative change in the spine. No acute osseous abnormalities are seen. IMPRESSION: Surgical clips in the right upper quadrant from cholecystectomy. No other metallic foreign body or evidence of implanted medical device in the abdomen. Electronically Signed   By: Keith Rake M.D.   On: 03/16/2021 18:38   CT HEAD WO CONTRAST (5MM)  Result Date: 03/16/2021 CLINICAL DATA:  Stroke  follow-up.  Basilar stent placement. EXAM: CT HEAD WITHOUT CONTRAST TECHNIQUE: Contiguous axial images were obtained from the base of the skull through the vertex without intravenous contrast. COMPARISON:  CT and CTA from earlier the same day FINDINGS: Brain: Acute infarct is likely present in the left paramedian brainstem and to a small degree in the right occipital cortex. Remote left inferior cerebellar infarction. Chronic lacunar infarcts. No hemorrhage, hydrocephalus, or collection. Vascular: Atheromatous calcification and mid basilar stent. Skull: Negative Sinuses/Orbits: Negative IMPRESSION: Acute infarct likely in the brainstem and right occipital cortex. No acute hemorrhage. Electronically Signed   By: Jorje Guild M.D.   On: 03/16/2021 06:58   CT HEAD WO CONTRAST  Result Date: 03/15/2021 CLINICAL DATA:  Mental status change, neck trauma. EXAM: CT HEAD WITHOUT CONTRAST CT CERVICAL SPINE WITHOUT CONTRAST TECHNIQUE: Multidetector CT imaging of the head and cervical spine was performed following the standard protocol without intravenous contrast. Multiplanar CT image reconstructions of the cervical spine were also generated. COMPARISON:  CT head and cervical spine 08/30/2017 FINDINGS: CT HEAD FINDINGS Brain: Chronic left cerebellar and left basal ganglia infarction. No evidence of large-territorial acute infarction. No parenchymal hemorrhage. No mass lesion. No extra-axial collection. No mass effect or midline shift. No hydrocephalus. Basilar cisterns are patent. Vascular: No hyperdense vessel. Atherosclerotic calcifications are present within the cavernous internal carotid arteries. Skull: No acute fracture or focal lesion. Sinuses/Orbits: Paranasal sinuses and mastoid air cells are clear. The orbits are unremarkable. Other: None. CT CERVICAL SPINE FINDINGS Alignment: Stable grade 1 anterolisthesis of C2 on C3 and C7 on T1. Skull base and vertebrae: Multilevel severe degenerative changes of the spine.  Associated multilevel severe neural foraminal stenosis. No severe osseous central canal stenosis. No acute fracture. No aggressive appearing focal osseous lesion or focal pathologic process. Soft tissues and spinal canal: No prevertebral fluid or swelling. No visible canal hematoma. Upper chest: Centrilobular emphysematous changes. Other: None. IMPRESSION: 1. No acute intracranial abnormality. 2. No acute displaced fracture or traumatic listhesis of the cervical spine in a patient with multilevel degenerative change of the spine leading to severe osseous neural foraminal stenosis. 3.  Emphysema (ICD10-J43.9). Electronically Signed   By: Iven Finn M.D.   On: 03/15/2021 22:35   CT CERVICAL SPINE WO CONTRAST  Result Date: 03/15/2021 CLINICAL DATA:  Mental status change, neck trauma. EXAM: CT HEAD WITHOUT CONTRAST CT CERVICAL SPINE WITHOUT CONTRAST TECHNIQUE: Multidetector CT imaging of the head and cervical spine was performed following the standard protocol without intravenous contrast. Multiplanar CT image reconstructions of the cervical spine were also generated. COMPARISON:  CT head and cervical spine 08/30/2017 FINDINGS: CT HEAD FINDINGS Brain: Chronic left cerebellar and left basal ganglia infarction. No evidence of large-territorial acute infarction. No parenchymal hemorrhage. No mass lesion. No extra-axial collection. No mass effect or midline shift. No hydrocephalus. Basilar cisterns are patent. Vascular: No hyperdense vessel. Atherosclerotic calcifications are present within  the cavernous internal carotid arteries. Skull: No acute fracture or focal lesion. Sinuses/Orbits: Paranasal sinuses and mastoid air cells are clear. The orbits are unremarkable. Other: None. CT CERVICAL SPINE FINDINGS Alignment: Stable grade 1 anterolisthesis of C2 on C3 and C7 on T1. Skull base and vertebrae: Multilevel severe degenerative changes of the spine. Associated multilevel severe neural foraminal stenosis. No severe  osseous central canal stenosis. No acute fracture. No aggressive appearing focal osseous lesion or focal pathologic process. Soft tissues and spinal canal: No prevertebral fluid or swelling. No visible canal hematoma. Upper chest: Centrilobular emphysematous changes. Other: None. IMPRESSION: 1. No acute intracranial abnormality. 2. No acute displaced fracture or traumatic listhesis of the cervical spine in a patient with multilevel degenerative change of the spine leading to severe osseous neural foraminal stenosis. 3.  Emphysema (ICD10-J43.9). Electronically Signed   By: Iven Finn M.D.   On: 03/15/2021 22:35   MR ANGIO HEAD WO CONTRAST  Result Date: 03/17/2021 CLINICAL DATA:  Follow-up examination for acute stroke. EXAM: MRI HEAD WITHOUT CONTRAST MRA HEAD WITHOUT CONTRAST TECHNIQUE: Multiplanar, multi-echo pulse sequences of the brain and surrounding structures were acquired without intravenous contrast. Angiographic images of the Circle of Willis were acquired using MRA technique without intravenous contrast. COMPARISON:  Prior studies from 03/16/2021. FINDINGS: MRI HEAD FINDINGS Brain: Moderately advanced cerebral atrophy. Patchy T2/FLAIR hyperintensity within the periventricular and deep white matter both cerebral hemispheres most consistent with chronic small vessel ischemic disease, moderate in nature. Multiple scattered remote lacunar infarcts present about the hemispheric cerebral white matter, basal ganglia, and thalami. Chronic left greater than right bilateral cerebellar infarcts. Few scattered chronic micro hemorrhages noted about the thalami, likely related to chronic poorly controlled hypertension. Patchy small volume acute ischemic infarcts seen involving the peripheral right cerebellum (series 5, image 54), pons (series 5, image 61), right occipital lobe (series 5, images 65, 70, 75), and right thalamus (series 5, image 73). No associated hemorrhage or mass effect. No other evidence for  acute or subacute ischemia. No acute intracranial hemorrhage. No mass lesion, mass effect or midline shift. No hydrocephalus or extra-axial fluid collection. Pituitary gland suprasellar region within normal limits. Midline structures intact. Vascular: Major intracranial vascular flow voids are maintained. Skull and upper cervical spine: Craniocervical junction within normal limits. Bone marrow signal intensity somewhat heterogeneous without focal marrow replacing lesion. No scalp soft tissue abnormality. Sinuses/Orbits: Globes and orbital soft tissues within normal limits. Scattered mucosal thickening noted within the ethmoidal air cells. Paranasal sinuses are otherwise clear. Trace right mastoid effusion noted, of doubtful significance. Inner ear structures grossly normal. Other: Chronic cystic lesion involving the subcutaneous fat of the lower left face again noted, of doubtful significance. MRA HEAD FINDINGS Anterior circulation: Examination mildly degraded by motion. Visualized distal cervical segments of the internal carotid arteries are patent with antegrade flow. Petrous and cavernous segments patent bilaterally. Moderate stenosis at the supraclinoid left ICA (series 1047, image 19). No significant narrowing about the contralateral supraclinoid right ICA by MRA. Right A1 patent. Left A1 hypoplastic and/or absent, accounting for the diminutive left ICA is compared to the right. Normal anterior communicating artery complex. Anterior cerebral arteries patent without stenosis. M1 segments patent without definite stenosis. Irregularity at the mid left M1 on MIP reconstructions felt to be consistent with motion artifact. No proximal MCA branch occlusion. Distal MCA branches perfused and symmetric. Posterior circulation: Atheromatous irregularity within the V4 segments bilaterally without hemodynamically significant stenosis by MRA. Right vertebral artery slightly dominant. Right PICA patent at  its origin. Left PICA  not seen. Interval placement of a vascular stent within the basilar artery, extending from the level of the AICAs distally. Grossly patent flow through the stent, although this is not well assessed by MRA. AICA remain perfused. Patent flow seen distally within the basilar artery. Superior cerebral arteries patent bilaterally. Right PCA supplied via the basilar. Fetal type origin left PCA. PCAs remain patent to their distal aspects without high-grade stenosis. Anatomic variants: Fetal type origin of the left PCA. Hypoplastic/absent left A1. IMPRESSION: MRI HEAD IMPRESSION: 1. Patchy small volume acute ischemic infarcts involving the right cerebellum, pons, right occipital lobe, and right thalamus as above. No associated hemorrhage or mass effect. 2. Underlying chronic microvascular ischemic disease with multiple remote lacunar infarcts about the hemispheric cerebral white matter, deep gray nuclei, and cerebellum. MRA HEAD IMPRESSION: 1. Interval stenting of the basilar artery. Grossly patent flow through the stent, with good perfusion distally. 2. Moderate stenosis at the supraclinoid left ICA. 3. Additional mild intracranial atherosclerotic disease. No other hemodynamically significant stenosis by MRA. Electronically Signed   By: Jeannine Boga M.D.   On: 03/17/2021 04:56   MR BRAIN WO CONTRAST  Result Date: 03/17/2021 CLINICAL DATA:  Follow-up examination for acute stroke. EXAM: MRI HEAD WITHOUT CONTRAST MRA HEAD WITHOUT CONTRAST TECHNIQUE: Multiplanar, multi-echo pulse sequences of the brain and surrounding structures were acquired without intravenous contrast. Angiographic images of the Circle of Willis were acquired using MRA technique without intravenous contrast. COMPARISON:  Prior studies from 03/16/2021. FINDINGS: MRI HEAD FINDINGS Brain: Moderately advanced cerebral atrophy. Patchy T2/FLAIR hyperintensity within the periventricular and deep white matter both cerebral hemispheres most consistent  with chronic small vessel ischemic disease, moderate in nature. Multiple scattered remote lacunar infarcts present about the hemispheric cerebral white matter, basal ganglia, and thalami. Chronic left greater than right bilateral cerebellar infarcts. Few scattered chronic micro hemorrhages noted about the thalami, likely related to chronic poorly controlled hypertension. Patchy small volume acute ischemic infarcts seen involving the peripheral right cerebellum (series 5, image 54), pons (series 5, image 61), right occipital lobe (series 5, images 65, 70, 75), and right thalamus (series 5, image 73). No associated hemorrhage or mass effect. No other evidence for acute or subacute ischemia. No acute intracranial hemorrhage. No mass lesion, mass effect or midline shift. No hydrocephalus or extra-axial fluid collection. Pituitary gland suprasellar region within normal limits. Midline structures intact. Vascular: Major intracranial vascular flow voids are maintained. Skull and upper cervical spine: Craniocervical junction within normal limits. Bone marrow signal intensity somewhat heterogeneous without focal marrow replacing lesion. No scalp soft tissue abnormality. Sinuses/Orbits: Globes and orbital soft tissues within normal limits. Scattered mucosal thickening noted within the ethmoidal air cells. Paranasal sinuses are otherwise clear. Trace right mastoid effusion noted, of doubtful significance. Inner ear structures grossly normal. Other: Chronic cystic lesion involving the subcutaneous fat of the lower left face again noted, of doubtful significance. MRA HEAD FINDINGS Anterior circulation: Examination mildly degraded by motion. Visualized distal cervical segments of the internal carotid arteries are patent with antegrade flow. Petrous and cavernous segments patent bilaterally. Moderate stenosis at the supraclinoid left ICA (series 1047, image 19). No significant narrowing about the contralateral supraclinoid right  ICA by MRA. Right A1 patent. Left A1 hypoplastic and/or absent, accounting for the diminutive left ICA is compared to the right. Normal anterior communicating artery complex. Anterior cerebral arteries patent without stenosis. M1 segments patent without definite stenosis. Irregularity at the mid left M1 on MIP reconstructions felt to  be consistent with motion artifact. No proximal MCA branch occlusion. Distal MCA branches perfused and symmetric. Posterior circulation: Atheromatous irregularity within the V4 segments bilaterally without hemodynamically significant stenosis by MRA. Right vertebral artery slightly dominant. Right PICA patent at its origin. Left PICA not seen. Interval placement of a vascular stent within the basilar artery, extending from the level of the AICAs distally. Grossly patent flow through the stent, although this is not well assessed by MRA. AICA remain perfused. Patent flow seen distally within the basilar artery. Superior cerebral arteries patent bilaterally. Right PCA supplied via the basilar. Fetal type origin left PCA. PCAs remain patent to their distal aspects without high-grade stenosis. Anatomic variants: Fetal type origin of the left PCA. Hypoplastic/absent left A1. IMPRESSION: MRI HEAD IMPRESSION: 1. Patchy small volume acute ischemic infarcts involving the right cerebellum, pons, right occipital lobe, and right thalamus as above. No associated hemorrhage or mass effect. 2. Underlying chronic microvascular ischemic disease with multiple remote lacunar infarcts about the hemispheric cerebral white matter, deep gray nuclei, and cerebellum. MRA HEAD IMPRESSION: 1. Interval stenting of the basilar artery. Grossly patent flow through the stent, with good perfusion distally. 2. Moderate stenosis at the supraclinoid left ICA. 3. Additional mild intracranial atherosclerotic disease. No other hemodynamically significant stenosis by MRA. Electronically Signed   By: Jeannine Boga M.D.    On: 03/17/2021 04:56   DG CHEST PORT 1 VIEW  Result Date: 03/17/2021 CLINICAL DATA:  70 year old male with history of respiratory failure. EXAM: PORTABLE CHEST 1 VIEW COMPARISON:  Chest x-ray 03/16/2021. FINDINGS: Previously noted endotracheal and nasogastric tubes have been removed. Lung volumes are normal. No consolidative airspace disease. No pleural effusions. No pneumothorax. No pulmonary nodule or mass noted. Pulmonary vasculature and the cardiomediastinal silhouette are within normal limits. Multiple old right-sided posterolateral rib fractures are again noted. IMPRESSION: 1.  No radiographic evidence of acute cardiopulmonary disease. Electronically Signed   By: Vinnie Langton M.D.   On: 03/17/2021 08:07   DG CHEST PORT 1 VIEW  Result Date: 03/16/2021 CLINICAL DATA:  Intubation an orogastric tube placement. EXAM: PORTABLE CHEST 1 VIEW COMPARISON:  02/12/2021. FINDINGS: The heart size and mediastinal contours are within normal limits. Both lungs are clear. Old healed rib fractures are noted bilaterally. An enteric tube courses over the left upper quadrant and out of the field of view. The side port projects over the anticipated region of the stomach. The endotracheal tube terminates 3.3 cm above the carina. IMPRESSION: 1. No acute cardiopulmonary process. 2. Medical devices as described above. Electronically Signed   By: Brett Fairy M.D.   On: 03/16/2021 04:29   DG Chest Port 1 View  Result Date: 03/15/2021 CLINICAL DATA:  Question aspiration. Found on ground at side of apartment. Gurgling respirations. EXAM: PORTABLE CHEST 1 VIEW COMPARISON:  Radiographs 10/29/2020 FINDINGS: The cardiomediastinal contours are normal. The lungs are clear. Pulmonary vasculature is normal. No consolidation, pleural effusion, or pneumothorax. Right posterior rib fractures are remote, they were acute on prior exam. There also remote left rib fractures. No acute osseous abnormalities are seen. IMPRESSION: 1. No  acute chest findings. No radiographic findings to suggest aspiration. 2. Remote bilateral rib fractures. Electronically Signed   By: Keith Rake M.D.   On: 03/15/2021 22:08   ECHOCARDIOGRAM COMPLETE  Result Date: 03/16/2021    ECHOCARDIOGRAM REPORT   Patient Name:   Andre [name] Date of Exam: 03/16/2021 Medical Rec #:  [id number]          Height:  72.0 in Accession #:    [id number]         Weight:       160.1 lb Date of Birth:  [birth year]          BSA:          1.938 m Patient Age:    70 years           BP:           112/85 mmHg Patient Gender: M                  HR:           84 bpm. Exam Location:  Inpatient Procedure: 2D Echo, Cardiac Doppler and Color Doppler Indications:    Stroke  History:        Patient has prior history of Echocardiogram examinations. Risk                 Factors:Hypertension.  Sonographer:    Jyl Heinz Referring Phys: [id number] [place]  1. Left ventricular ejection fraction, by estimation, is 50 to 55%. The left ventricle has low normal function. The left ventricle has no regional wall motion abnormalities. There is mild left ventricular hypertrophy. Left ventricular diastolic parameters are indeterminate.  2. Right ventricular systolic function is normal. The right ventricular size is normal. There is normal pulmonary artery systolic pressure. The estimated right ventricular systolic pressure is 59.9 mmHg.  3. The mitral valve is abnormal. Trivial mitral valve regurgitation.  4. The aortic valve is tricuspid. Aortic valve regurgitation is trivial. Aortic valve sclerosis/calcification is present, without any evidence of aortic stenosis.  5. The inferior vena cava is normal in size with <50% respiratory variability, suggesting right atrial pressure of 8 mmHg. Comparison(s): No prior Echocardiogram. FINDINGS  Left Ventricle: Left ventricular ejection fraction, by estimation, is 50 to 55%. The left ventricle has low normal function. The left ventricle  has no regional wall motion abnormalities. The left ventricular internal cavity size was normal in size. There is mild left ventricular hypertrophy. Left ventricular diastolic parameters are indeterminate. Right Ventricle: The right ventricular size is normal. No increase in right ventricular wall thickness. Right ventricular systolic function is normal. There is normal pulmonary artery systolic pressure. The tricuspid regurgitant velocity is 2.23 m/s, and  with an assumed right atrial pressure of 8 mmHg, the estimated right ventricular systolic pressure is 35.7 mmHg. Left Atrium: Left atrial size was normal in size. Right Atrium: Right atrial size was normal in size. Pericardium: There is no evidence of pericardial effusion. Mitral Valve: The mitral valve is abnormal. There is mild thickening of the mitral valve leaflet(s). Trivial mitral valve regurgitation. Tricuspid Valve: The tricuspid valve is grossly normal. Tricuspid valve regurgitation is mild. Aortic Valve: The aortic valve is tricuspid. There is mild aortic valve annular calcification. Aortic valve regurgitation is trivial. Aortic valve sclerosis/calcification is present, without any evidence of aortic stenosis. Aortic valve peak gradient measures 9.6 mmHg. Pulmonic Valve: The pulmonic valve was grossly normal. Pulmonic valve regurgitation is trivial. Aorta: The aortic root is normal in size and structure. Venous: The inferior vena cava is normal in size with less than 50% respiratory variability, suggesting right atrial pressure of 8 mmHg. IAS/Shunts: No atrial level shunt detected by color flow Doppler.  LEFT VENTRICLE PLAX 2D LVIDd:         4.70 cm     Diastology LVIDs:         3.30 cm  LV e' medial:    6.74 cm/s LV PW:         1.10 cm     LV E/e' medial:  8.1 LV IVS:        1.10 cm     LV e' lateral:   6.09 cm/s LVOT diam:     2.60 cm     LV E/e' lateral: 9.0 LV SV:         89 LV SV Index:   46 LVOT Area:     5.31 cm  LV Volumes (MOD) LV vol d,  MOD A2C: 97.4 ml LV vol d, MOD A4C: 86.4 ml LV vol s, MOD A2C: 46.6 ml LV vol s, MOD A4C: 41.6 ml LV SV MOD A2C:     50.8 ml LV SV MOD A4C:     86.4 ml LV SV MOD BP:      48.6 ml RIGHT VENTRICLE             IVC RV Basal diam:  3.30 cm     IVC diam: 2.00 cm RV Mid diam:    2.40 cm RV S prime:     12.00 cm/s TAPSE (M-mode): 2.1 cm LEFT ATRIUM           Index        RIGHT ATRIUM           Index LA diam:      3.10 cm 1.60 cm/m   RA Area:     10.40 cm LA Vol (A2C): 12.0 ml 6.19 ml/m   RA Volume:   19.80 ml  10.22 ml/m LA Vol (A4C): 53.5 ml 27.60 ml/m  AORTIC VALVE AV Area (Vmax): 3.23 cm AV Vmax:        155.00 cm/s AV Peak Grad:   9.6 mmHg LVOT Vmax:      94.20 cm/s LVOT Vmean:     71.400 cm/s LVOT VTI:       0.167 m  AORTA Ao Root diam: 3.20 cm MITRAL VALVE               TRICUSPID VALVE MV Area (PHT): 3.91 cm    TR Peak grad:   19.9 mmHg MV Decel Time: 194 msec    TR Vmax:        223.00 cm/s MV E velocity: 54.70 cm/s MV A velocity: 68.90 cm/s  SHUNTS MV E/A ratio:  0.79        Systemic VTI:  0.17 m                            Systemic Diam: 2.60 cm Rozann Lesches MD Electronically signed by Rozann Lesches MD Signature Date/Time: 03/16/2021/12:07:57 PM    Final    CT ANGIO HEAD CODE STROKE  Result Date: 03/16/2021 CLINICAL DATA:  Initial evaluation for acute stroke. EXAM: CT ANGIOGRAPHY HEAD AND NECK TECHNIQUE: Multidetector CT imaging of the head and neck was performed using the standard protocol during bolus administration of intravenous contrast. Multiplanar CT image reconstructions and MIPs were obtained to evaluate the vascular anatomy. Carotid stenosis measurements (when applicable) are obtained utilizing NASCET criteria, using the distal internal carotid diameter as the denominator. CONTRAST:  49mL OMNIPAQUE IOHEXOL 350 MG/ML SOLN COMPARISON:  CT from 03/15/2021 as well as prior CTA from 05/04/2017. FINDINGS: CTA NECK FINDINGS Aortic arch: Partially visualized aortic arch in origin of the great  vessels grossly normal in caliber. No visible stenosis about the origin  of the great vessels. Right carotid system: Right CCA patent from its origin to the bifurcation without stenosis. Bulky calcified plaque at the right carotid bulb with associated stenosis of up to 80% by NASCET criteria (series 7, image 222). Right ICA patent distally without stenosis, dissection or occlusion. Left carotid system: Left CCA patent from its origin to the bifurcation without stenosis. Eccentric mixed plaque at the left carotid bulb with associated stenosis of up to 80% by NASCET criteria (series 7, image 229). Left ICA patent distally without stenosis dissection or occlusion. Vertebral arteries: Both vertebral arteries arise from the subclavian arteries. No visible proximal subclavian artery stenosis. Right vertebral artery slightly dominant. Vertebral arteries patent within the neck without stenosis dissection or occlusion. Skeleton: No discrete or worrisome osseous lesions. Moderate to advanced multilevel spondylosis noted throughout the cervical spine. Postsurgical changes noted at the mandible. Other neck: Multiple hypodense/cystic lesions noted within the subcutaneous fat of the upper face/neck, greater on the left. The largest of these seen on the left and measures 2.8 cm in size (series 5, image 97). Few small foci of gas seen within this lesion. Additional lesion seen slightly inferiorly (series 5, image 112). Focal calcifications seen within a 1.2 cm lesion on the right (series 5, image 115). Findings are most likely benign in reflect sebaceous cysts, and are similar as compared to 2018. No other acute soft tissue abnormality within the neck. Upper chest: Emphysema. Visualized upper chest demonstrates no other acute finding. Review of the MIP images confirms the above findings CTA HEAD FINDINGS Anterior circulation: Petrous segments patent bilaterally. Atheromatous plaque within the carotid siphons bilaterally with  associated moderate stenosis at the supraclinoid right ICA (series 7, image 110). Focal severe stenosis seen at the contralateral supraclinoid left ICA (series 7, image 113). Right A1 patent. Left A1 hypoplastic and/or absent. Normal anterior communicating artery complex. Anterior cerebral arteries remain patent to their distal aspects without stenosis. Right M1 widely patent. Focal moderate distal left M1 stenosis (series 10, image 22). Normal MCA bifurcations. Distal MCA branches perfused and symmetric. Posterior circulation: Extensive atheromatous disease throughout the V4 segments bilaterally with associated moderate multifocal narrowing on the right, with moderate to severe multifocal narrowing on the left. Distal left V4 segment nearly occludes prior to the vertebrobasilar junction. Right PICA origin remains patent. Left PICA not definitely seen. There is occlusion of the mid basilar artery distally, concerning for occlusive thrombus (series 8, image 110). Perfusion of the distal basilar artery distally, likely collateral. Superior cerebellar arteries perfused and patent bilaterally. Right PCA supplied via the basilar. Multifocal atheromatous irregularity throughout the right PCA without high-grade stenosis. Fetal type origin of the left PCA which remains patent to its distal aspect. Venous sinuses: Patent allowing for timing the contrast bolus. Anatomic variants: Fetal type origin of the left PCA. No visible aneurysm. Review of the MIP images confirms the above findings IMPRESSION: 1. 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 Basilar artery is perfused distally, likely collateral in nature. Both SCAs and PCAs remain patent and perfused at this time. 2. Extensive atheromatous disease throughout the V4 segments bilaterally with associated moderate to severe multifocal narrowing, left worse than right. 3. Severe atheromatous stenoses of up to 80% at the carotid bifurcations bilaterally. 4. Moderate stenosis  at the supraclinoid right ICA, with severe stenosis at the supraclinoid left ICA. 5. Moderate distal left M1 stenosis. 6. Emphysema (ICD10-J43.9). Critical Value/emergent results were called by telephone at the time of interpretation on 03/16/2021 at 12:10 a.m.  to provider [facility] , who verbally acknowledged these results. Electronically Signed   By: Jeannine Boga M.D.   On: 03/16/2021 01:43   CT ANGIO NECK CODE STROKE  Result Date: 03/16/2021 CLINICAL DATA:  Initial evaluation for acute stroke. EXAM: CT ANGIOGRAPHY HEAD AND NECK TECHNIQUE: Multidetector CT imaging of the head and neck was performed using the standard protocol during bolus administration of intravenous contrast. Multiplanar CT image reconstructions and MIPs were obtained to evaluate the vascular anatomy. Carotid stenosis measurements (when applicable) are obtained utilizing NASCET criteria, using the distal internal carotid diameter as the denominator. CONTRAST:  37mL OMNIPAQUE IOHEXOL 350 MG/ML SOLN COMPARISON:  CT from 03/15/2021 as well as prior CTA from 05/04/2017. FINDINGS: CTA NECK FINDINGS Aortic arch: Partially visualized aortic arch in origin of the great vessels grossly normal in caliber. No visible stenosis about the origin of the great vessels. Right carotid system: Right CCA patent from its origin to the bifurcation without stenosis. Bulky calcified plaque at the right carotid bulb with associated stenosis of up to 80% by NASCET criteria (series 7, image 222). Right ICA patent distally without stenosis, dissection or occlusion. Left carotid system: Left CCA patent from its origin to the bifurcation without stenosis. Eccentric mixed plaque at the left carotid bulb with associated stenosis of up to 80% by NASCET criteria (series 7, image 229). Left ICA patent distally without stenosis dissection or occlusion. Vertebral arteries: Both vertebral arteries arise from the subclavian arteries. No visible proximal  subclavian artery stenosis. Right vertebral artery slightly dominant. Vertebral arteries patent within the neck without stenosis dissection or occlusion. Skeleton: No discrete or worrisome osseous lesions. Moderate to advanced multilevel spondylosis noted throughout the cervical spine. Postsurgical changes noted at the mandible. Other neck: Multiple hypodense/cystic lesions noted within the subcutaneous fat of the upper face/neck, greater on the left. The largest of these seen on the left and measures 2.8 cm in size (series 5, image 97). Few small foci of gas seen within this lesion. Additional lesion seen slightly inferiorly (series 5, image 112). Focal calcifications seen within a 1.2 cm lesion on the right (series 5, image 115). Findings are most likely benign in reflect sebaceous cysts, and are similar as compared to 2018. No other acute soft tissue abnormality within the neck. Upper chest: Emphysema. Visualized upper chest demonstrates no other acute finding. Review of the MIP images confirms the above findings CTA HEAD FINDINGS Anterior circulation: Petrous segments patent bilaterally. Atheromatous plaque within the carotid siphons bilaterally with associated moderate stenosis at the supraclinoid right ICA (series 7, image 110). Focal severe stenosis seen at the contralateral supraclinoid left ICA (series 7, image 113). Right A1 patent. Left A1 hypoplastic and/or absent. Normal anterior communicating artery complex. Anterior cerebral arteries remain patent to their distal aspects without stenosis. Right M1 widely patent. Focal moderate distal left M1 stenosis (series 10, image 22). Normal MCA bifurcations. Distal MCA branches perfused and symmetric. Posterior circulation: Extensive atheromatous disease throughout the V4 segments bilaterally with associated moderate multifocal narrowing on the right, with moderate to severe multifocal narrowing on the left. Distal left V4 segment nearly occludes prior to the  vertebrobasilar junction. Right PICA origin remains patent. Left PICA not definitely seen. There is occlusion of the mid basilar artery distally, concerning for occlusive thrombus (series 8, image 110). Perfusion of the distal basilar artery distally, likely collateral. Superior cerebellar arteries perfused and patent bilaterally. Right PCA supplied via the basilar. Multifocal atheromatous irregularity throughout the right PCA without high-grade stenosis.  Fetal type origin of the left PCA which remains patent to its distal aspect. Venous sinuses: Patent allowing for timing the contrast bolus. Anatomic variants: Fetal type origin of the left PCA. No visible aneurysm. Review of the MIP images confirms the above findings IMPRESSION: 1. 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 Basilar artery is perfused distally, likely collateral in nature. Both SCAs and PCAs remain patent and perfused at this time. 2. Extensive atheromatous disease throughout the V4 segments bilaterally with associated moderate to severe multifocal narrowing, left worse than right. 3. Severe atheromatous stenoses of up to 80% at the carotid bifurcations bilaterally. 4. Moderate stenosis at the supraclinoid right ICA, with severe stenosis at the supraclinoid left ICA. 5. Moderate distal left M1 stenosis. 6. Emphysema (ICD10-J43.9). Critical Value/emergent results were called by telephone at the time of interpretation on 03/16/2021 at 12:10 a.m. to provider [facility] , who verbally acknowledged these results. Electronically Signed   By: Jeannine Boga M.D.   On: 03/16/2021 01:43    Labs:  CBC: Recent Labs    03/16/21 0453 03/16/21 1845 03/17/21 0333 03/18/21 0248  WBC 5.8 9.7 8.4 7.0  HGB 12.0* 12.4* 12.1* 12.0*  HCT 38.0* 40.0 38.9* 37.8*  PLT 183 167 171 158    COAGS: Recent Labs    03/16/21 0443  INR 1.1  APTT 113*    BMP: Recent Labs    03/15/21 2145 03/15/21 2237 03/16/21 0015 03/16/21 0444  03/16/21 0453 03/17/21 0333 03/18/21 0248  NA 137   < > 141 140 139 139 136  K 4.1   < > 4.0 3.6 3.5 4.0 4.0  CL 105  --  104  --  110 107 104  CO2 23  --   --   --  21* 26 24  GLUCOSE 82  --  69*  --  112* 90 92  BUN 15  --  19  --  12 9 13   CALCIUM 8.8*  --   --   --  8.1* 8.5* 8.6*  CREATININE 1.30*  --  1.40*  --  1.13 1.16 1.13  GFRNONAA 60*  --   --   --  >60 >60 >60   < > = values in this interval not displayed.    LIVER FUNCTION TESTS: Recent Labs    10/31/20 1610 03/15/21 2145 03/17/21 0333  BILITOT 2.2* 0.8 1.3*  AST 33 22 32  ALT 14 11 11   ALKPHOS 54 47 42  PROT 7.1 6.4* 5.5*  ALBUMIN 3.5 3.4* 2.8*    Assessment and Plan:  S/p bilateral vertebral artery angiograms with complete revascularization of occluded basilar artery by aspiration. ICAD stenosis treated with stent assisted angioplasty with revascularization. -Pt to transfer to medical bed when becomes available -Continue to monitor right femoral puncture site until healed. -Further plan of care per neuro/IR  Please call IR with questions/ concerns.   Electronically Signed: Tyson Alias, NP 03/18/2021, 3:33 PM   I spent a total of 15 Minutes at the the patient's bedside AND on the patient's hospital floor or unit, greater than 50% of which was counseling/coordinating care for s/p bilateral vertebral artery angiograms with complete revascularization of occluded basilar artery by aspiration and ICAD stenosis treated with stent assisted angioplasty with revascularization.

## 2021-03-18 NOTE — Progress Notes (Signed)
Physical Therapy Treatment Patient Details Name: Andre [name] MRN: [id number] DOB: Apr 18, 1951 Today's Date: 03/18/2021   History of Present Illness 70 y/o male presented to ED on 03/15/21 after being found altered and R sided weakness. Initial CT head negative. 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 S/p thrombectomy on 11/12. Repeat head CT showed acute infarct likely in brainstem and R occipital cortex. Intubated and extubated 11/12. PMH: HTN, prior stroke with residual R weakness, ETOH abuse    PT Comments    Pt moved OOB to [facility] and back to bed (as he was transferring to 3W from the ICU) with one person mod assist today, improved over previous session.  He will likely be able to progress to short distance gait, but would be safer with a second person assisting given impulsivity and ataxia.  PT will continue to follow acutely for safe mobility progression.  Recommendations for follow up therapy are one component of a multi-disciplinary discharge planning process, led by the attending physician.  Recommendations may be updated based on patient status, additional functional criteria and insurance authorization.  Follow Up Recommendations  Acute inpatient rehab (3hours/day)     Assistance Recommended at Discharge Frequent or constant Supervision/Assistance  Equipment Recommendations  Rolling walker (2 wheels);BSC/3in1    Recommendations for Other Services Rehab consult     Precautions / Restrictions Precautions Precautions: Fall Precaution Comments: right sided weakness, ataxia     Mobility  Bed Mobility Overal bed mobility: Needs Assistance Bed Mobility: Supine to Sit;Sit to Supine Rolling: Min assist   Supine to sit: Min assist Sit to supine: Min assist   General bed mobility comments: Min assist to help support pt's trunk mostly, he is able to move his legs into and out of bed without assist.    Transfers Overall transfer level: Needs  assistance Equipment used: None;Rolling walker (2 wheels) Transfers: Sit to/from Stand;Bed to chair/wheelchair/BSC Sit to Stand: Mod assist     Step pivot transfers: Mod assist     General transfer comment: Mod assist to step pivot to    Ambulation/Gait                   Stairs             Wheelchair Mobility    Modified Rankin (Stroke Patients Only) Modified Rankin (Stroke Patients Only) Pre-Morbid Rankin Score: No symptoms Modified Rankin: Moderately severe disability     Balance Overall balance assessment: Needs assistance Sitting-balance support: Feet supported;Bilateral upper extremity supported Sitting balance-[name]: Fair     Standing balance support: Bilateral upper extremity supported Standing balance-[name]: Poor Standing balance comment: needs external support in standing, total assist for peri care as he could not let go of RW and keep balance.                            Cognition Arousal/Alertness: Awake/alert Behavior During Therapy: Flat affect Overall Cognitive Status: Impaired/Different from baseline Area of Impairment: Safety/judgement;Awareness;Problem solving                         Safety/Judgement: Decreased awareness of safety Awareness: Emergent Problem Solving: Difficulty sequencing;Requires verbal cues;Requires tactile cues General Comments: Pt able to tell me he had a stroke and that his right side is weak, however, moves impulsively and quckly        Exercises      General Comments  Pertinent Vitals/Pain Pain Assessment: No/denies pain    Home Living                          Prior Function            PT Goals (current goals can now be found in the care plan section) Acute Rehab PT Goals Patient Stated Goal: to walk again Progress towards PT goals: Progressing toward goals    Frequency    Min 4X/week      PT Plan Current plan remains appropriate     Co-evaluation              AM-PAC PT "6 Clicks" Mobility   Outcome Measure  Help needed turning from your back to your side while in a flat bed without using bedrails?: A Little Help needed moving from lying on your back to sitting on the side of a flat bed without using bedrails?: A Little Help needed moving to and from a bed to a chair (including a wheelchair)?: A Lot Help needed standing up from a chair using your arms (e.g., wheelchair or bedside chair)?: A Lot Help needed to walk in hospital room?: A Lot Help needed climbing 3-5 steps with a railing? : Total 6 Click Score: 13    End of Session Equipment Utilized During Treatment: Gait belt Activity Tolerance: Patient tolerated treatment well Patient left: in bed;with call bell/phone within reach;with bed alarm set Nurse Communication: Mobility status;Other (comment) (RN assisting with care in room) PT Visit Diagnosis: Unsteadiness on feet (R26.81);Muscle weakness (generalized) (M62.81);Difficulty in walking, not elsewhere classified (R26.2);Other symptoms and signs involving the nervous system (X41.287)     Time: 8676-7209 PT Time Calculation (min) (ACUTE ONLY): 19 min  Charges:  $Therapeutic Activity: 8-22 mins                    Verdene Lennert, PT, DPT  Acute Rehabilitation Ortho Tech Supervisor [phone] pager [phone]) [phone] office

## 2021-03-19 ENCOUNTER — Other Ambulatory Visit: Payer: Self-pay | Admitting: Radiology

## 2021-03-19 DIAGNOSIS — I6322 Cerebral infarction due to unspecified occlusion or stenosis of basilar arteries: Secondary | ICD-10-CM

## 2021-03-19 NOTE — Care Management Important Message (Signed)
Important Message  Patient Details  Name: Andre [name] MRN: [id number] Date of Birth: 1950-10-21   Medicare Important Message Given:  Yes     [name] 03/19/2021, 2:15 PM

## 2021-03-19 NOTE — TOC CAGE-AID Note (Signed)
Transition of Care [facility]) - CAGE-AID Screening   Patient Details  Name: Andre [name] MRN: [id number] Date of Birth: [birth year]  Transition of Care [facility] CM/SW Contact:    Gaetano Hawthorne Tarpley-Carter, [place] Phone Number: 03/19/2021, 3:12 PM   Clinical Narrative: Pt participated in [place].  Pt stated he does use substance and ETOH.  Pt was offered resources, due to  usage of substance and ETOH.   Jodie Cavey Tarpley-Carter, MSW, LCSW-A Pronouns:  She/Her/Hers [facility] Clinical Social Worker Direct Number:  [phone] [email or web address]    CAGE-AID Screening:    Have You Ever Felt You Ought to Cut Down on Your Drinking or Drug Use?: Yes Have People Annoyed You By [employer] Your Drinking Or Drug Use?: No Have You Felt Bad Or Guilty About Your Drinking Or Drug Use?: No Have You Ever Had a Drink or Used Drugs First Thing In The Morning to Steady Your Nerves or to Get Rid of a Hangover?: No CAGE-AID Score: 1  Substance Abuse Education Offered: Yes  Substance abuse interventions: Scientist, clinical (histocompatibility and immunogenetics)

## 2021-03-19 NOTE — Progress Notes (Addendum)
STROKE TEAM PROGRESS NOTE   ATTENDING NOTE: I reviewed above note and agree with the assessment and plan. Pt was seen and examined.   Patient lying in bed, no family at the bedside.  No acute event overnight, neuro stable unchanged.  Encourage working hard with PT/OT.  Patient has no 24/7 at home for support, although PT/OT recommend CIR, currently patient will need SNF placement.  Continue aspirin Brilinta as well as Lipitor for stroke prevention.  Cessation of cocaine, cigarette, and limiting alcohol use education again provided.  For detailed assessment and plan, please refer to above as I have made changes wherever appropriate.   Rosalin Hawking, MD PhD Stroke Neurology 03/19/2021 4:49 PM    INTERVAL HISTORY   Neuro unchanged. Passed swallow on diet.    Vitals:   03/18/21 2025 03/19/21 0014 03/19/21 0354 03/19/21 0828  BP: (!) 126/104 133/86 139/80 119/67  Pulse: 84 81 81 83  Resp: 18 18 18 16   Temp: 98.8 F (37.1 C) 98.1 F (36.7 C) 99 F (37.2 C) 98.4 F (36.9 C)  TempSrc: Oral Oral Oral Oral  SpO2: 99% 99% 97% 100%  Weight:      Height:       CBC:  Recent Labs  Lab 03/17/21 0333 03/18/21 0248  WBC 8.4 7.0  NEUTROABS 6.1 5.0  HGB 12.1* 12.0*  HCT 38.9* 37.8*  MCV 95.3 94.5  PLT 171 505   Basic Metabolic Panel:  Recent Labs  Lab 03/16/21 1229 03/17/21 0333 03/18/21 0248  NA  --  139 136  K  --  4.0 4.0  CL  --  107 104  CO2  --  26 24  GLUCOSE  --  90 92  BUN  --  9 13  CREATININE  --  1.16 1.13  CALCIUM  --  8.5* 8.6*  MG 1.9  --   --    Lipid Panel:  Recent Labs  Lab 03/16/21 0453  CHOL 135  TRIG 49  HDL 71  CHOLHDL 1.9  VLDL 10  LDLCALC 54   HgbA1c:  Recent Labs  Lab 03/16/21 0453  HGBA1C 4.6*   Urine Drug Screen:  Recent Labs  Lab 03/15/21 2133  LABOPIA NONE DETECTED  COCAINSCRNUR POSITIVE*  LABBENZ NONE DETECTED  AMPHETMU NONE DETECTED  THCU NONE DETECTED  LABBARB NONE DETECTED    Alcohol Level  Recent Labs  Lab  03/15/21 2145  ETH 16*    IMAGING past 24 hours DG Abd 1 View  Result Date: 03/16/2021 CLINICAL DATA:  Altered mental status. Metal foreign body in abdomen. EXAM: ABDOMEN - 1 VIEW COMPARISON:  None. FINDINGS: Surgical clips in the right upper quadrant. There is no other metallic foreign body or evidence of implanted medical device in the abdomen. Mild gaseous distention of bowel loops without evidence of obstruction. Included lung bases are clear. Multilevel degenerative change in the spine. No acute osseous abnormalities are seen. IMPRESSION: Surgical clips in the right upper quadrant from cholecystectomy. No other metallic foreign body or evidence of implanted medical device in the abdomen. Electronically Signed   By: Keith Rake M.D.   On: 03/16/2021 18:38   CT HEAD WO CONTRAST (5MM)  Result Date: 03/16/2021 CLINICAL DATA:  Stroke follow-up.  Basilar stent placement. EXAM: CT HEAD WITHOUT CONTRAST TECHNIQUE: Contiguous axial images were obtained from the base of the skull through the vertex without intravenous contrast. COMPARISON:  CT and CTA from earlier the same day FINDINGS: Brain: Acute infarct is likely present  in the left paramedian brainstem and to a small degree in the right occipital cortex. Remote left inferior cerebellar infarction. Chronic lacunar infarcts. No hemorrhage, hydrocephalus, or collection. Vascular: Atheromatous calcification and mid basilar stent. Skull: Negative Sinuses/Orbits: Negative IMPRESSION: Acute infarct likely in the brainstem and right occipital cortex. No acute hemorrhage. Electronically Signed   By: Jorje Guild M.D.   On: 03/16/2021 06:58   CT HEAD WO CONTRAST  Result Date: 03/15/2021 CLINICAL DATA:  Mental status change, neck trauma. EXAM: CT HEAD WITHOUT CONTRAST CT CERVICAL SPINE WITHOUT CONTRAST TECHNIQUE: Multidetector CT imaging of the head and cervical spine was performed following the standard protocol without intravenous contrast.  Multiplanar CT image reconstructions of the cervical spine were also generated. COMPARISON:  CT head and cervical spine 08/30/2017 FINDINGS: CT HEAD FINDINGS Brain: Chronic left cerebellar and left basal ganglia infarction. No evidence of large-territorial acute infarction. No parenchymal hemorrhage. No mass lesion. No extra-axial collection. No mass effect or midline shift. No hydrocephalus. Basilar cisterns are patent. Vascular: No hyperdense vessel. Atherosclerotic calcifications are present within the cavernous internal carotid arteries. Skull: No acute fracture or focal lesion. Sinuses/Orbits: Paranasal sinuses and mastoid air cells are clear. The orbits are unremarkable. Other: None. CT CERVICAL SPINE FINDINGS Alignment: Stable grade 1 anterolisthesis of C2 on C3 and C7 on T1. Skull base and vertebrae: Multilevel severe degenerative changes of the spine. Associated multilevel severe neural foraminal stenosis. No severe osseous central canal stenosis. No acute fracture. No aggressive appearing focal osseous lesion or focal pathologic process. Soft tissues and spinal canal: No prevertebral fluid or swelling. No visible canal hematoma. Upper chest: Centrilobular emphysematous changes. Other: None. IMPRESSION: 1. No acute intracranial abnormality. 2. No acute displaced fracture or traumatic listhesis of the cervical spine in a patient with multilevel degenerative change of the spine leading to severe osseous neural foraminal stenosis. 3.  Emphysema (ICD10-J43.9). Electronically Signed   By: Iven Finn M.D.   On: 03/15/2021 22:35   CT CERVICAL SPINE WO CONTRAST  Result Date: 03/15/2021 CLINICAL DATA:  Mental status change, neck trauma. EXAM: CT HEAD WITHOUT CONTRAST CT CERVICAL SPINE WITHOUT CONTRAST TECHNIQUE: Multidetector CT imaging of the head and cervical spine was performed following the standard protocol without intravenous contrast. Multiplanar CT image reconstructions of the cervical spine were  also generated. COMPARISON:  CT head and cervical spine 08/30/2017 FINDINGS: CT HEAD FINDINGS Brain: Chronic left cerebellar and left basal ganglia infarction. No evidence of large-territorial acute infarction. No parenchymal hemorrhage. No mass lesion. No extra-axial collection. No mass effect or midline shift. No hydrocephalus. Basilar cisterns are patent. Vascular: No hyperdense vessel. Atherosclerotic calcifications are present within the cavernous internal carotid arteries. Skull: No acute fracture or focal lesion. Sinuses/Orbits: Paranasal sinuses and mastoid air cells are clear. The orbits are unremarkable. Other: None. CT CERVICAL SPINE FINDINGS Alignment: Stable grade 1 anterolisthesis of C2 on C3 and C7 on T1. Skull base and vertebrae: Multilevel severe degenerative changes of the spine. Associated multilevel severe neural foraminal stenosis. No severe osseous central canal stenosis. No acute fracture. No aggressive appearing focal osseous lesion or focal pathologic process. Soft tissues and spinal canal: No prevertebral fluid or swelling. No visible canal hematoma. Upper chest: Centrilobular emphysematous changes. Other: None. IMPRESSION: 1. No acute intracranial abnormality. 2. No acute displaced fracture or traumatic listhesis of the cervical spine in a patient with multilevel degenerative change of the spine leading to severe osseous neural foraminal stenosis. 3.  Emphysema (ICD10-J43.9). Electronically Signed   By: Thomasena Edis  Mckinley Jewel M.D.   On: 03/15/2021 22:35   MR ANGIO HEAD WO CONTRAST  Result Date: 03/17/2021 CLINICAL DATA:  Follow-up examination for acute stroke. EXAM: MRI HEAD WITHOUT CONTRAST MRA HEAD WITHOUT CONTRAST TECHNIQUE: Multiplanar, multi-echo pulse sequences of the brain and surrounding structures were acquired without intravenous contrast. Angiographic images of the Circle of Willis were acquired using MRA technique without intravenous contrast. COMPARISON:  Prior studies from  03/16/2021. FINDINGS: MRI HEAD FINDINGS Brain: Moderately advanced cerebral atrophy. Patchy T2/FLAIR hyperintensity within the periventricular and deep white matter both cerebral hemispheres most consistent with chronic small vessel ischemic disease, moderate in nature. Multiple scattered remote lacunar infarcts present about the hemispheric cerebral white matter, basal ganglia, and thalami. Chronic left greater than right bilateral cerebellar infarcts. Few scattered chronic micro hemorrhages noted about the thalami, likely related to chronic poorly controlled hypertension. Patchy small volume acute ischemic infarcts seen involving the peripheral right cerebellum (series 5, image 54), pons (series 5, image 61), right occipital lobe (series 5, images 65, 70, 75), and right thalamus (series 5, image 73). No associated hemorrhage or mass effect. No other evidence for acute or subacute ischemia. No acute intracranial hemorrhage. No mass lesion, mass effect or midline shift. No hydrocephalus or extra-axial fluid collection. Pituitary gland suprasellar region within normal limits. Midline structures intact. Vascular: Major intracranial vascular flow voids are maintained. Skull and upper cervical spine: Craniocervical junction within normal limits. Bone marrow signal intensity somewhat heterogeneous without focal marrow replacing lesion. No scalp soft tissue abnormality. Sinuses/Orbits: Globes and orbital soft tissues within normal limits. Scattered mucosal thickening noted within the ethmoidal air cells. Paranasal sinuses are otherwise clear. Trace right mastoid effusion noted, of doubtful significance. Inner ear structures grossly normal. Other: Chronic cystic lesion involving the subcutaneous fat of the lower left face again noted, of doubtful significance. MRA HEAD FINDINGS Anterior circulation: Examination mildly degraded by motion. Visualized distal cervical segments of the internal carotid arteries are patent with  antegrade flow. Petrous and cavernous segments patent bilaterally. Moderate stenosis at the supraclinoid left ICA (series 1047, image 19). No significant narrowing about the contralateral supraclinoid right ICA by MRA. Right A1 patent. Left A1 hypoplastic and/or absent, accounting for the diminutive left ICA is compared to the right. Normal anterior communicating artery complex. Anterior cerebral arteries patent without stenosis. M1 segments patent without definite stenosis. Irregularity at the mid left M1 on MIP reconstructions felt to be consistent with motion artifact. No proximal MCA branch occlusion. Distal MCA branches perfused and symmetric. Posterior circulation: Atheromatous irregularity within the V4 segments bilaterally without hemodynamically significant stenosis by MRA. Right vertebral artery slightly dominant. Right PICA patent at its origin. Left PICA not seen. Interval placement of a vascular stent within the basilar artery, extending from the level of the AICAs distally. Grossly patent flow through the stent, although this is not well assessed by MRA. AICA remain perfused. Patent flow seen distally within the basilar artery. Superior cerebral arteries patent bilaterally. Right PCA supplied via the basilar. Fetal type origin left PCA. PCAs remain patent to their distal aspects without high-grade stenosis. Anatomic variants: Fetal type origin of the left PCA. Hypoplastic/absent left A1. IMPRESSION: MRI HEAD IMPRESSION: 1. Patchy small volume acute ischemic infarcts involving the right cerebellum, pons, right occipital lobe, and right thalamus as above. No associated hemorrhage or mass effect. 2. Underlying chronic microvascular ischemic disease with multiple remote lacunar infarcts about the hemispheric cerebral white matter, deep gray nuclei, and cerebellum. MRA HEAD IMPRESSION: 1. Interval stenting of the  basilar artery. Grossly patent flow through the stent, with good perfusion distally. 2.  Moderate stenosis at the supraclinoid left ICA. 3. Additional mild intracranial atherosclerotic disease. No other hemodynamically significant stenosis by MRA. Electronically Signed   By: Jeannine Boga M.D.   On: 03/17/2021 04:56   MR BRAIN WO CONTRAST  Result Date: 03/17/2021 CLINICAL DATA:  Follow-up examination for acute stroke. EXAM: MRI HEAD WITHOUT CONTRAST MRA HEAD WITHOUT CONTRAST TECHNIQUE: Multiplanar, multi-echo pulse sequences of the brain and surrounding structures were acquired without intravenous contrast. Angiographic images of the Circle of Willis were acquired using MRA technique without intravenous contrast. COMPARISON:  Prior studies from 03/16/2021. FINDINGS: MRI HEAD FINDINGS Brain: Moderately advanced cerebral atrophy. Patchy T2/FLAIR hyperintensity within the periventricular and deep white matter both cerebral hemispheres most consistent with chronic small vessel ischemic disease, moderate in nature. Multiple scattered remote lacunar infarcts present about the hemispheric cerebral white matter, basal ganglia, and thalami. Chronic left greater than right bilateral cerebellar infarcts. Few scattered chronic micro hemorrhages noted about the thalami, likely related to chronic poorly controlled hypertension. Patchy small volume acute ischemic infarcts seen involving the peripheral right cerebellum (series 5, image 54), pons (series 5, image 61), right occipital lobe (series 5, images 65, 70, 75), and right thalamus (series 5, image 73). No associated hemorrhage or mass effect. No other evidence for acute or subacute ischemia. No acute intracranial hemorrhage. No mass lesion, mass effect or midline shift. No hydrocephalus or extra-axial fluid collection. Pituitary gland suprasellar region within normal limits. Midline structures intact. Vascular: Major intracranial vascular flow voids are maintained. Skull and upper cervical spine: Craniocervical junction within normal limits. Bone  marrow signal intensity somewhat heterogeneous without focal marrow replacing lesion. No scalp soft tissue abnormality. Sinuses/Orbits: Globes and orbital soft tissues within normal limits. Scattered mucosal thickening noted within the ethmoidal air cells. Paranasal sinuses are otherwise clear. Trace right mastoid effusion noted, of doubtful significance. Inner ear structures grossly normal. Other: Chronic cystic lesion involving the subcutaneous fat of the lower left face again noted, of doubtful significance. MRA HEAD FINDINGS Anterior circulation: Examination mildly degraded by motion. Visualized distal cervical segments of the internal carotid arteries are patent with antegrade flow. Petrous and cavernous segments patent bilaterally. Moderate stenosis at the supraclinoid left ICA (series 1047, image 19). No significant narrowing about the contralateral supraclinoid right ICA by MRA. Right A1 patent. Left A1 hypoplastic and/or absent, accounting for the diminutive left ICA is compared to the right. Normal anterior communicating artery complex. Anterior cerebral arteries patent without stenosis. M1 segments patent without definite stenosis. Irregularity at the mid left M1 on MIP reconstructions felt to be consistent with motion artifact. No proximal MCA branch occlusion. Distal MCA branches perfused and symmetric. Posterior circulation: Atheromatous irregularity within the V4 segments bilaterally without hemodynamically significant stenosis by MRA. Right vertebral artery slightly dominant. Right PICA patent at its origin. Left PICA not seen. Interval placement of a vascular stent within the basilar artery, extending from the level of the AICAs distally. Grossly patent flow through the stent, although this is not well assessed by MRA. AICA remain perfused. Patent flow seen distally within the basilar artery. Superior cerebral arteries patent bilaterally. Right PCA supplied via the basilar. Fetal type origin left  PCA. PCAs remain patent to their distal aspects without high-grade stenosis. Anatomic variants: Fetal type origin of the left PCA. Hypoplastic/absent left A1. IMPRESSION: MRI HEAD IMPRESSION: 1. Patchy small volume acute ischemic infarcts involving the right cerebellum, pons, right occipital lobe, and  right thalamus as above. No associated hemorrhage or mass effect. 2. Underlying chronic microvascular ischemic disease with multiple remote lacunar infarcts about the hemispheric cerebral white matter, deep gray nuclei, and cerebellum. MRA HEAD IMPRESSION: 1. Interval stenting of the basilar artery. Grossly patent flow through the stent, with good perfusion distally. 2. Moderate stenosis at the supraclinoid left ICA. 3. Additional mild intracranial atherosclerotic disease. No other hemodynamically significant stenosis by MRA. Electronically Signed   By: Jeannine Boga M.D.   On: 03/17/2021 04:56   DG CHEST PORT 1 VIEW  Result Date: 03/17/2021 CLINICAL DATA:  70 year old male with history of respiratory failure. EXAM: PORTABLE CHEST 1 VIEW COMPARISON:  Chest x-ray 03/16/2021. FINDINGS: Previously noted endotracheal and nasogastric tubes have been removed. Lung volumes are normal. No consolidative airspace disease. No pleural effusions. No pneumothorax. No pulmonary nodule or mass noted. Pulmonary vasculature and the cardiomediastinal silhouette are within normal limits. Multiple old right-sided posterolateral rib fractures are again noted. IMPRESSION: 1.  No radiographic evidence of acute cardiopulmonary disease. Electronically Signed   By: Vinnie Langton M.D.   On: 03/17/2021 08:07   DG CHEST PORT 1 VIEW  Result Date: 03/16/2021 CLINICAL DATA:  Intubation an orogastric tube placement. EXAM: PORTABLE CHEST 1 VIEW COMPARISON:  02/12/2021. FINDINGS: The heart size and mediastinal contours are within normal limits. Both lungs are clear. Old healed rib fractures are noted bilaterally. An enteric tube  courses over the left upper quadrant and out of the field of view. The side port projects over the anticipated region of the stomach. The endotracheal tube terminates 3.3 cm above the carina. IMPRESSION: 1. No acute cardiopulmonary process. 2. Medical devices as described above. Electronically Signed   By: Brett Fairy M.D.   On: 03/16/2021 04:29   DG Chest Port 1 View  Result Date: 03/15/2021 CLINICAL DATA:  Question aspiration. Found on ground at side of apartment. Gurgling respirations. EXAM: PORTABLE CHEST 1 VIEW COMPARISON:  Radiographs 10/29/2020 FINDINGS: The cardiomediastinal contours are normal. The lungs are clear. Pulmonary vasculature is normal. No consolidation, pleural effusion, or pneumothorax. Right posterior rib fractures are remote, they were acute on prior exam. There also remote left rib fractures. No acute osseous abnormalities are seen. IMPRESSION: 1. No acute chest findings. No radiographic findings to suggest aspiration. 2. Remote bilateral rib fractures. Electronically Signed   By: Keith Rake M.D.   On: 03/15/2021 22:08   ECHOCARDIOGRAM COMPLETE  Result Date: 03/16/2021    ECHOCARDIOGRAM REPORT   Patient Name:   BIJAN RIDGLEY Date of Exam: 03/16/2021 Medical Rec #:  [id number]          Height:       72.0 in Accession #:    [id number]         Weight:       160.1 lb Date of Birth:  [birth year]          BSA:          1.938 m Patient Age:    58 years           BP:           112/85 mmHg Patient Gender: M                  HR:           84 bpm. Exam Location:  Inpatient Procedure: 2D Echo, Cardiac Doppler and Color Doppler Indications:    Stroke  History:        Patient has  prior history of Echocardiogram examinations. Risk                 Factors:Hypertension.  Sonographer:    Jyl Heinz Referring Phys: [id number] [place]  1. Left ventricular ejection fraction, by estimation, is 50 to 55%. The left ventricle has low normal function. The left ventricle has  no regional wall motion abnormalities. There is mild left ventricular hypertrophy. Left ventricular diastolic parameters are indeterminate.  2. Right ventricular systolic function is normal. The right ventricular size is normal. There is normal pulmonary artery systolic pressure. The estimated right ventricular systolic pressure is 54.2 mmHg.  3. The mitral valve is abnormal. Trivial mitral valve regurgitation.  4. The aortic valve is tricuspid. Aortic valve regurgitation is trivial. Aortic valve sclerosis/calcification is present, without any evidence of aortic stenosis.  5. The inferior vena cava is normal in size with <50% respiratory variability, suggesting right atrial pressure of 8 mmHg. Comparison(s): No prior Echocardiogram. FINDINGS  Left Ventricle: Left ventricular ejection fraction, by estimation, is 50 to 55%. The left ventricle has low normal function. The left ventricle has no regional wall motion abnormalities. The left ventricular internal cavity size was normal in size. There is mild left ventricular hypertrophy. Left ventricular diastolic parameters are indeterminate. Right Ventricle: The right ventricular size is normal. No increase in right ventricular wall thickness. Right ventricular systolic function is normal. There is normal pulmonary artery systolic pressure. The tricuspid regurgitant velocity is 2.23 m/s, and  with an assumed right atrial pressure of 8 mmHg, the estimated right ventricular systolic pressure is 70.6 mmHg. Left Atrium: Left atrial size was normal in size. Right Atrium: Right atrial size was normal in size. Pericardium: There is no evidence of pericardial effusion. Mitral Valve: The mitral valve is abnormal. There is mild thickening of the mitral valve leaflet(s). Trivial mitral valve regurgitation. Tricuspid Valve: The tricuspid valve is grossly normal. Tricuspid valve regurgitation is mild. Aortic Valve: The aortic valve is tricuspid. There is mild aortic valve annular  calcification. Aortic valve regurgitation is trivial. Aortic valve sclerosis/calcification is present, without any evidence of aortic stenosis. Aortic valve peak gradient measures 9.6 mmHg. Pulmonic Valve: The pulmonic valve was grossly normal. Pulmonic valve regurgitation is trivial. Aorta: The aortic root is normal in size and structure. Venous: The inferior vena cava is normal in size with less than 50% respiratory variability, suggesting right atrial pressure of 8 mmHg. IAS/Shunts: No atrial level shunt detected by color flow Doppler.  LEFT VENTRICLE PLAX 2D LVIDd:         4.70 cm     Diastology LVIDs:         3.30 cm     LV e' medial:    6.74 cm/s LV PW:         1.10 cm     LV E/e' medial:  8.1 LV IVS:        1.10 cm     LV e' lateral:   6.09 cm/s LVOT diam:     2.60 cm     LV E/e' lateral: 9.0 LV SV:         89 LV SV Index:   46 LVOT Area:     5.31 cm  LV Volumes (MOD) LV vol d, MOD A2C: 97.4 ml LV vol d, MOD A4C: 86.4 ml LV vol s, MOD A2C: 46.6 ml LV vol s, MOD A4C: 41.6 ml LV SV MOD A2C:     50.8 ml LV SV MOD A4C:  86.4 ml LV SV MOD BP:      48.6 ml RIGHT VENTRICLE             IVC RV Basal diam:  3.30 cm     IVC diam: 2.00 cm RV Mid diam:    2.40 cm RV S prime:     12.00 cm/s TAPSE (M-mode): 2.1 cm LEFT ATRIUM           Index        RIGHT ATRIUM           Index LA diam:      3.10 cm 1.60 cm/m   RA Area:     10.40 cm LA Vol (A2C): 12.0 ml 6.19 ml/m   RA Volume:   19.80 ml  10.22 ml/m LA Vol (A4C): 53.5 ml 27.60 ml/m  AORTIC VALVE AV Area (Vmax): 3.23 cm AV Vmax:        155.00 cm/s AV Peak Grad:   9.6 mmHg LVOT Vmax:      94.20 cm/s LVOT Vmean:     71.400 cm/s LVOT VTI:       0.167 m  AORTA Ao Root diam: 3.20 cm MITRAL VALVE               TRICUSPID VALVE MV Area (PHT): 3.91 cm    TR Peak grad:   19.9 mmHg MV Decel Time: 194 msec    TR Vmax:        223.00 cm/s MV E velocity: 54.70 cm/s MV A velocity: 68.90 cm/s  SHUNTS MV E/A ratio:  0.79        Systemic VTI:  0.17 m                             Systemic Diam: 2.60 cm Rozann Lesches MD Electronically signed by Rozann Lesches MD Signature Date/Time: 03/16/2021/12:07:57 PM    Final    CT ANGIO HEAD CODE STROKE  Result Date: 03/16/2021 CLINICAL DATA:  Initial evaluation for acute stroke. EXAM: CT ANGIOGRAPHY HEAD AND NECK TECHNIQUE: Multidetector CT imaging of the head and neck was performed using the standard protocol during bolus administration of intravenous contrast. Multiplanar CT image reconstructions and MIPs were obtained to evaluate the vascular anatomy. Carotid stenosis measurements (when applicable) are obtained utilizing NASCET criteria, using the distal internal carotid diameter as the denominator. CONTRAST:  12mL OMNIPAQUE IOHEXOL 350 MG/ML SOLN COMPARISON:  CT from 03/15/2021 as well as prior CTA from 05/04/2017. FINDINGS: CTA NECK FINDINGS Aortic arch: Partially visualized aortic arch in origin of the great vessels grossly normal in caliber. No visible stenosis about the origin of the great vessels. Right carotid system: Right CCA patent from its origin to the bifurcation without stenosis. Bulky calcified plaque at the right carotid bulb with associated stenosis of up to 80% by NASCET criteria (series 7, image 222). Right ICA patent distally without stenosis, dissection or occlusion. Left carotid system: Left CCA patent from its origin to the bifurcation without stenosis. Eccentric mixed plaque at the left carotid bulb with associated stenosis of up to 80% by NASCET criteria (series 7, image 229). Left ICA patent distally without stenosis dissection or occlusion. Vertebral arteries: Both vertebral arteries arise from the subclavian arteries. No visible proximal subclavian artery stenosis. Right vertebral artery slightly dominant. Vertebral arteries patent within the neck without stenosis dissection or occlusion. Skeleton: No discrete or worrisome osseous lesions. Moderate to advanced multilevel spondylosis noted throughout the cervical  spine. Postsurgical changes noted at the mandible. Other neck: Multiple hypodense/cystic lesions noted within the subcutaneous fat of the upper face/neck, greater on the left. The largest of these seen on the left and measures 2.8 cm in size (series 5, image 97). Few small foci of gas seen within this lesion. Additional lesion seen slightly inferiorly (series 5, image 112). Focal calcifications seen within a 1.2 cm lesion on the right (series 5, image 115). Findings are most likely benign in reflect sebaceous cysts, and are similar as compared to 2018. No other acute soft tissue abnormality within the neck. Upper chest: Emphysema. Visualized upper chest demonstrates no other acute finding. Review of the MIP images confirms the above findings CTA HEAD FINDINGS Anterior circulation: Petrous segments patent bilaterally. Atheromatous plaque within the carotid siphons bilaterally with associated moderate stenosis at the supraclinoid right ICA (series 7, image 110). Focal severe stenosis seen at the contralateral supraclinoid left ICA (series 7, image 113). Right A1 patent. Left A1 hypoplastic and/or absent. Normal anterior communicating artery complex. Anterior cerebral arteries remain patent to their distal aspects without stenosis. Right M1 widely patent. Focal moderate distal left M1 stenosis (series 10, image 22). Normal MCA bifurcations. Distal MCA branches perfused and symmetric. Posterior circulation: Extensive atheromatous disease throughout the V4 segments bilaterally with associated moderate multifocal narrowing on the right, with moderate to severe multifocal narrowing on the left. Distal left V4 segment nearly occludes prior to the vertebrobasilar junction. Right PICA origin remains patent. Left PICA not definitely seen. There is occlusion of the mid basilar artery distally, concerning for occlusive thrombus (series 8, image 110). Perfusion of the distal basilar artery distally, likely collateral. Superior  cerebellar arteries perfused and patent bilaterally. Right PCA supplied via the basilar. Multifocal atheromatous irregularity throughout the right PCA without high-grade stenosis. Fetal type origin of the left PCA which remains patent to its distal aspect. Venous sinuses: Patent allowing for timing the contrast bolus. Anatomic variants: Fetal type origin of the left PCA. No visible aneurysm. Review of the MIP images confirms the above findings IMPRESSION: 1. 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 Basilar artery is perfused distally, likely collateral in nature. Both SCAs and PCAs remain patent and perfused at this time. 2. Extensive atheromatous disease throughout the V4 segments bilaterally with associated moderate to severe multifocal narrowing, left worse than right. 3. Severe atheromatous stenoses of up to 80% at the carotid bifurcations bilaterally. 4. Moderate stenosis at the supraclinoid right ICA, with severe stenosis at the supraclinoid left ICA. 5. Moderate distal left M1 stenosis. 6. Emphysema (ICD10-J43.9). Critical Value/emergent results were called by telephone at the time of interpretation on 03/16/2021 at 12:10 a.m. to provider [facility] , who verbally acknowledged these results. Electronically Signed   By: Jeannine Boga M.D.   On: 03/16/2021 01:43   CT ANGIO NECK CODE STROKE  Result Date: 03/16/2021 CLINICAL DATA:  Initial evaluation for acute stroke. EXAM: CT ANGIOGRAPHY HEAD AND NECK TECHNIQUE: Multidetector CT imaging of the head and neck was performed using the standard protocol during bolus administration of intravenous contrast. Multiplanar CT image reconstructions and MIPs were obtained to evaluate the vascular anatomy. Carotid stenosis measurements (when applicable) are obtained utilizing NASCET criteria, using the distal internal carotid diameter as the denominator. CONTRAST:  71mL OMNIPAQUE IOHEXOL 350 MG/ML SOLN COMPARISON:  CT from 03/15/2021 as well as prior  CTA from 05/04/2017. FINDINGS: CTA NECK FINDINGS Aortic arch: Partially visualized aortic arch in origin of the great vessels grossly normal in caliber.  No visible stenosis about the origin of the great vessels. Right carotid system: Right CCA patent from its origin to the bifurcation without stenosis. Bulky calcified plaque at the right carotid bulb with associated stenosis of up to 80% by NASCET criteria (series 7, image 222). Right ICA patent distally without stenosis, dissection or occlusion. Left carotid system: Left CCA patent from its origin to the bifurcation without stenosis. Eccentric mixed plaque at the left carotid bulb with associated stenosis of up to 80% by NASCET criteria (series 7, image 229). Left ICA patent distally without stenosis dissection or occlusion. Vertebral arteries: Both vertebral arteries arise from the subclavian arteries. No visible proximal subclavian artery stenosis. Right vertebral artery slightly dominant. Vertebral arteries patent within the neck without stenosis dissection or occlusion. Skeleton: No discrete or worrisome osseous lesions. Moderate to advanced multilevel spondylosis noted throughout the cervical spine. Postsurgical changes noted at the mandible. Other neck: Multiple hypodense/cystic lesions noted within the subcutaneous fat of the upper face/neck, greater on the left. The largest of these seen on the left and measures 2.8 cm in size (series 5, image 97). Few small foci of gas seen within this lesion. Additional lesion seen slightly inferiorly (series 5, image 112). Focal calcifications seen within a 1.2 cm lesion on the right (series 5, image 115). Findings are most likely benign in reflect sebaceous cysts, and are similar as compared to 2018. No other acute soft tissue abnormality within the neck. Upper chest: Emphysema. Visualized upper chest demonstrates no other acute finding. Review of the MIP images confirms the above findings CTA HEAD FINDINGS Anterior  circulation: Petrous segments patent bilaterally. Atheromatous plaque within the carotid siphons bilaterally with associated moderate stenosis at the supraclinoid right ICA (series 7, image 110). Focal severe stenosis seen at the contralateral supraclinoid left ICA (series 7, image 113). Right A1 patent. Left A1 hypoplastic and/or absent. Normal anterior communicating artery complex. Anterior cerebral arteries remain patent to their distal aspects without stenosis. Right M1 widely patent. Focal moderate distal left M1 stenosis (series 10, image 22). Normal MCA bifurcations. Distal MCA branches perfused and symmetric. Posterior circulation: Extensive atheromatous disease throughout the V4 segments bilaterally with associated moderate multifocal narrowing on the right, with moderate to severe multifocal narrowing on the left. Distal left V4 segment nearly occludes prior to the vertebrobasilar junction. Right PICA origin remains patent. Left PICA not definitely seen. There is occlusion of the mid basilar artery distally, concerning for occlusive thrombus (series 8, image 110). Perfusion of the distal basilar artery distally, likely collateral. Superior cerebellar arteries perfused and patent bilaterally. Right PCA supplied via the basilar. Multifocal atheromatous irregularity throughout the right PCA without high-grade stenosis. Fetal type origin of the left PCA which remains patent to its distal aspect. Venous sinuses: Patent allowing for timing the contrast bolus. Anatomic variants: Fetal type origin of the left PCA. No visible aneurysm. Review of the MIP images confirms the above findings IMPRESSION: 1. 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 Basilar artery is perfused distally, likely collateral in nature. Both SCAs and PCAs remain patent and perfused at this time. 2. Extensive atheromatous disease throughout the V4 segments bilaterally with associated moderate to severe multifocal narrowing, left worse than  right. 3. Severe atheromatous stenoses of up to 80% at the carotid bifurcations bilaterally. 4. Moderate stenosis at the supraclinoid right ICA, with severe stenosis at the supraclinoid left ICA. 5. Moderate distal left M1 stenosis. 6. Emphysema (ICD10-J43.9). Critical Value/emergent results were called by telephone at the time of  interpretation on 03/16/2021 at 12:10 a.m. to provider [facility] , who verbally acknowledged these results. Electronically Signed   By: Jeannine Boga M.D.   On: 03/16/2021 01:43     PHYSICAL EXAM  Temp:  [98.1 F (36.7 C)-100.3 F (37.9 C)] 98.4 F (36.9 C) (11/15 0828) Pulse Rate:  [81-87] 83 (11/15 0828) Resp:  [16-33] 16 (11/15 0828) BP: (113-139)/(67-104) 119/67 (11/15 0828) SpO2:  [92 %-100 %] 100 % (11/15 0828)  General - Well nourished, well developed, in no apparent distress.  Ophthalmologic - fundi not visualized due to noncooperation.  Cardiovascular - Regular rhythm and rate.  Neuro - awake, alert, eyes open, orientated to age, place, time. No aphasia, however moderate to severe dysarthria, following all simple commands. No gaze palsy, tracking bilaterally, visual field full, PERRL, no nystagmus or disconjugate eyes. Mild right facial droop. Tongue midline. LUE and LLE at least 4/5, RUE 3-/5 proximal and distally. RLE 3/5 proximal and 3+/5 distally. Sensation decreased on the left, left FTN ataxia and HTS dysmetria. R HTS dysmetria but seems not out of proportion to the weakness, gait not tested.     ASSESSMENT/PLAN Mr. ZIAD MAYE is a 70 y.o. male with history of hypertension, alcohol abuse, smoker, substance abuse, history of stroke, admitted for unresponsiveness, right-sided weakness, left gaze, slurred speech and left facial droop after alcohol abuse.  CT no acute abnormality, CT head and neck mid basilar artery occlusion.  Status post IR with TICI3 and basilar artery stenting.  Stroke: b/l pontine and right PCA scattered  infarct secondary to progressive large vessel disease with persistent cocaine abuse, smoker and alcohol abuse Code Stroke CT head No acute abnormality.    CTA head & neck Acute occlusion of mid basilar artery, atheromatous disease in V4 segments with multfocal narrowing, left worse than right, 80% atheromatous stenoses at carotid bifurcations, moderate stenosis of supraclinoid right ICA, severe stenosis of supraclinoid left ICA, moderate distal left M1 stenosis Post IR CT acute infarct in brainstem and right occipital cortex MRI small volume acute ischemic infarcts in right cerebellum, pons, right occipital lobe and right thalamus with underlying chronic microvascular ischemic disease MRA Patent flow through basilar artery stent and moderate stenosis of supraclinoid left ICA 2D Echo EF 50-55% LDL 54 HgbA1c 4.6 UDS positive for cocaine VTE prophylaxis - Lovenox aspirin 81 mg daily and clopidogrel 75 mg daily prior to admission, now on aspirin 81 mg daily and Brilinta (ticagrelor) 90 mg bid given stenting Therapy recommendations:  CIR Disposition:  pending  History of stroke 05/2015, MRI showed right pontine infarct, remote right BG and cerebellum infarct.  CTA head and neck showed right ICA 60% stenosis, left ICA 50% stenosis, bilateral ICA siphon, VA and BA stenosis.  EF 50 to 55%, LDL 52, UDS positive for cocaine.  Discharged with DAPT and Zocor 04/2017 admitted for right-sided weakness, slurred speech and right facial droop.  MRI showed left pontine infarct.  CT head and neck bilateral ICA bulb 30 to 40% stenosis.  Severe left siphon stenosis, basilar artery stenosis.  EF 60 to 65%.  A1c 4.6, LDL 45, UDS positive for cocaine.  Discharged with DAPT and Lipitor 80.  Progressive intracranial vascular stenosis 05/2015, CT head and neck showed bilateral VAs and BA stenosis, however BA patent 04/2017 CTA head and neck showed similar bilateral VAs and BA stenosis but patent Current admission CTA head  and neck showed progressive worsening bilateral V4 stenosis and atherosclerosis.  BA occlusion.  Hypertension Home meds:  none BP goal < 180/105 now Stable off Cleviprex On amlodipine 10 Long-term BP goal normotensive  Hyperlipidemia Home meds:  atorvastatin 80 mg daily, resumed in hospital LDL 54, goal < 70 High intensity statin continued Continue statin at discharge  Cocaine abuse UDS positive for cocaine, patient also used cocaine prior to previous strokes. Social work consult for substance abuse cessation assistance Cessation education will be provided  Tobacco abuse Current smoker Smoking cessation counseling will be provided  Alcohol abuse On CIWA protocol FA/MVI/B1 Education on alcohol Limited use will be provided ETOH use, alcohol level 16, will advise to drink no more than 2 drink(s) a day  Other Stroke Risk Factors Advanced Age >/= 38   Other Active Problems    Hospital day # 3      To contact Stroke Continuity provider, please refer to [email or web address] After hours, contact General Neurology

## 2021-03-19 NOTE — Progress Notes (Signed)
Inpatient Rehab Admissions Coordinator:    I spoke with pt and his brother, Josph Macho, regarding potential CIR admission. Pt. And brother state that Pt. Will  not have 24/7 support at home following an admission to CIR, and would like to pursue SNF for short term rehab instead. CIR will sign off at this time. TOC notified.   Clemens Catholic, [place], [place] Admissions Coordinator  [phone] ([place]) [phone] (office)

## 2021-03-19 NOTE — Care Management Important Message (Signed)
Important Message  Patient Details  Name: Andre [name] MRN: [id number] Date of Birth: 03-06-1951   Medicare Important Message Given:  Yes     [name] 03/19/2021, 2:14 PM

## 2021-03-19 NOTE — Progress Notes (Signed)
Inpatient Rehabilitation Admissions Coordinator   I will place rehab consult order per protocol with progression of mobility with therapy today.  Danne Baxter, RN, MSN Rehab Admissions Coordinator [phone] 03/19/2021 9:17 AM

## 2021-03-19 NOTE — NC FL2 (Signed)
[place] LEVEL OF CARE SCREENING TOOL     IDENTIFICATION  Patient Name: Andre [name] Birthdate: 08-05-1950 Sex: male Admission Date (Current Location): 03/15/2021  [employer] and [place] Number:  Herbalist and Address:  [facility] 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   Acute respiratory failure with hypoxia ([place])    Closed fracture of right distal radius 10/30/2020   SIRS (systemic inflammatory response syndrome) ([place]) 12/30/2019   Acute cholecystitis 10/11/2018   Furuncle of face 08/07/2017   Localized swelling, mass or lump of neck 06/17/2017   Healthcare maintenance 09/06/2015   Essential hypertension 05/09/2015   History of CVA (cerebrovascular accident) 05/09/2015   Tobacco use disorder    HLD (hyperlipidemia)    Alcohol abuse    Polysubstance abuse ([place]) 03/31/2011    Orientation RESPIRATION BLADDER Height & Weight     Self, Time, Situation, Place  Normal Incontinent Weight: 72.6 kg Height:  6' (182.9 cm)  BEHAVIORAL SYMPTOMS/MOOD NEUROLOGICAL BOWEL NUTRITION STATUS      Continent Diet (dyphagia 1 with thin liquids)  AMBULATORY STATUS COMMUNICATION OF NEEDS Skin   Extensive Assist Verbally Normal                       Personal Care Assistance Level of Assistance  Bathing, Feeding, Dressing Bathing Assistance: Maximum assistance Feeding assistance: Limited assistance Dressing Assistance: Maximum  assistance     Functional Limitations Info  Sight, Hearing, Speech Sight Info: Adequate Hearing Info: Adequate Speech Info: Impaired (dyarthria)    SPECIAL CARE FACTORS FREQUENCY  PT (By licensed PT), OT (By licensed OT), Speech therapy     PT Frequency: 5x/wk OT Frequency: 5x/wk     Speech Therapy Frequency: 5x/wk      Contractures Contractures Info: Not present    Additional Factors Info  Code Status, Allergies Code Status Info: Full Allergies Info: NKA           Current Medications (03/19/2021):  This is the current hospital active medication list Current Facility-Administered Medications  Medication Dose Route Frequency Provider Last Rate Last Admin   acetaminophen (TYLENOL) tablet 650 mg  650 mg Oral Q4H PRN Deveshwar, Willaim Rayas, MD   650 mg at 03/18/21 2040   Or   acetaminophen (TYLENOL) 160 MG/5ML solution 650 mg  650 mg Per Tube Q4H PRN Deveshwar, Willaim Rayas, MD       Or   acetaminophen (TYLENOL) suppository 650 mg  650 mg Rectal Q4H PRN Deveshwar, Sanjeev, MD       amLODipine (NORVASC) tablet 10 mg  10 mg Oral Daily Margaretha Seeds, MD   10 mg at 03/19/21 1036   aspirin chewable tablet 81 mg  81 mg Oral Daily Deveshwar, Willaim Rayas, MD   81 mg at 03/19/21 1036   Or   aspirin chewable tablet 81 mg  81 mg Per Tube Daily Luanne Bras, MD   81 mg at 03/16/21 1009  atorvastatin (LIPITOR) tablet 80 mg  80 mg Oral Daily Rosalin Hawking, MD   80 mg at 03/19/21 1036   docusate sodium (COLACE) capsule 100 mg  100 mg Oral BID Rosalin Hawking, MD   100 mg at 03/19/21 1036   enoxaparin (LOVENOX) injection 40 mg  40 mg Subcutaneous Q24H Donnetta Simpers, MD   40 mg at 40/76/80 8811   folic acid (FOLVITE) tablet 1 mg  1 mg Oral Daily Rosalin Hawking, MD   1 mg at 03/19/21 1036   labetalol (NORMODYNE) injection 5-10 mg  5-10 mg Intravenous Q2H PRN Rosalin Hawking, MD       LORazepam (ATIVAN) injection 0-4 mg  0-4 mg Intravenous Q12H Muthersbaugh, Hannah, PA-C       Or   LORazepam  (ATIVAN) tablet 0-4 mg  0-4 mg Oral Q12H Muthersbaugh, Hannah, PA-C       multivitamin (PROSIGHT) tablet 1 tablet  1 tablet Oral Daily Rosalin Hawking, MD   1 tablet at 03/19/21 1036   pantoprazole (PROTONIX) EC tablet 40 mg  40 mg Oral Daily Rosalin Hawking, MD   40 mg at 03/19/21 1036   polyethylene glycol (MIRALAX / GLYCOLAX) packet 17 g  17 g Oral Daily Rosalin Hawking, MD   17 g at 03/17/21 0956   senna-docusate (Senokot-S) tablet 1 tablet  1 tablet Oral QHS PRN Donnetta Simpers, MD       thiamine tablet 100 mg  100 mg Oral Daily Rosalin Hawking, MD   100 mg at 03/19/21 1036   ticagrelor (BRILINTA) tablet 90 mg  90 mg Oral BID Luanne Bras, MD   90 mg at 03/19/21 1036   Or   ticagrelor (BRILINTA) tablet 90 mg  90 mg Per Tube BID Luanne Bras, MD   90 mg at 03/16/21 1010     Discharge Medications: Please see discharge summary for a list of discharge medications.  Relevant Imaging Results:  Relevant Lab Results:   Additional Information SS#: [id number]  Pollie Friar, RN

## 2021-03-19 NOTE — TOC Initial Note (Signed)
Transition of Care [facility]) - Initial/Assessment Note    Patient Details  Name: Andre [name] MRN: [id number] Date of Birth: 1950/10/16  Transition of [facility]) CM/SW Contact:    Pollie Friar, RN Phone Number: 03/19/2021, 12:12 PM  Clinical Narrative:                 Patient is from home with a roommate. Recommendations are for IR but patient would not have the needed support after an AIR stay. CM met with the patient and his brother, Josph Macho and they are asking for SNF rehab. Pt is agreeable to being faxed out in the [facility] area. He is most interested in [facility] as he has been there in the past.  TOC following.  Expected Discharge Plan: Skilled Nursing Facility Barriers to Discharge: Continued Medical Work up   Patient Goals and CMS Choice   CMS Medicare.gov Compare Post Acute Care list provided to:: Patient Choice offered to / list presented to : Patient, Sibling  Expected Discharge Plan and Services Expected Discharge Plan: [place] In-house Referral: Clinical Social Work Discharge Planning Services: CM Consult Post Acute Care Choice: [place] Living arrangements for the past 2 months: Apartment                                      Prior Living Arrangements/Services Living arrangements for the past 2 months: Apartment Lives with:: Roommate Patient language and need for interpreter reviewed:: Yes Do you feel safe going back to the place where you live?: Yes      Need for Family Participation in Patient Care: Yes (Comment) Care giver support system in place?: No (comment)   Criminal Activity/Legal Involvement Pertinent to Current Situation/Hospitalization: No - Comment as needed  Activities of Daily Living      Permission Sought/Granted                  Emotional Assessment Appearance:: Appears stated age Attitude/Demeanor/Rapport: Engaged Affect (typically observed): Accepting Orientation: : Oriented  to Self, Oriented to Place, Oriented to  Time, Oriented to Situation Alcohol / Substance Use: Illicit Drugs Psych Involvement: No (comment)  Admission diagnosis:  Basilar artery thrombosis [I65.1] Altered mental status, unspecified altered mental status type [R41.82] Cerebrovascular accident (CVA), unspecified mechanism ([place]) [I63.9] 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[place]) [I63.22] Patient Active Problem List   Diagnosis Date Noted   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[place]) 03/16/2021   Acute respiratory failure with hypoxia (H[facility])    Closed fracture of right distal radius 10/30/2020   SIRS (systemic inflammatory response syndrome) ([place]) 12/30/2019   Acute cholecystitis 10/11/2018   Furuncle of face 08/07/2017   Localized swelling, mass or lump of neck 06/17/2017   Healthcare maintenance 09/06/2015   Essential hypertension 05/09/2015   History of CVA (cerebrovascular accident) 05/09/2015   Tobacco use disorder    HLD (hyperlipidemia)    Alcohol abuse    Polysubstance abuse ([place]) 03/31/2011   PCP:  Maudie Mercury, MD Pharmacy:   [facility], [place] [address] Phone: [phone] Fax: [phone]  [id number] - [place], [place] [address] [place] [address] Phone: [phone] Fax: [phone]     Social Determinants of  Health (SDOH) Interventions    Readmission Risk Interventions No flowsheet data found.

## 2021-03-19 NOTE — Progress Notes (Signed)
Physical Therapy Treatment Patient Details Name: Andre [name] MRN: [id number] DOB: 30-Aug-1950 Today's Date: 03/19/2021   History of Present Illness 70 y/o male presented to ED on 03/15/21 after being found altered and R sided weakness. Initial CT head negative. 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 S/p thrombectomy on 11/12. Repeat head CT showed acute infarct likely in brainstem and R occipital cortex. Intubated and extubated 11/12. PMH: HTN, prior stroke with residual R weakness, ETOH abuse    PT Comments    Pt began gait training today. Pt with ataxic near scissoring pattern but improved immensely from first bout to second. Pt with noted difficulty clearing and sequencing R LE vs L LE, required assist for walker management and was unable to hold head up causing pt to significantly lean forward requiring maxA assist and verbal cues to achieve trunk extension. Due to place of infarct pt with dizziness/room spinning when upright, causing pt to hold his head in flexion and L rotation. Pt to strongly benefit from CIR, if pt unable to attend CIR, pt will need ST-SNF upon d/c for maximal functional recovery.    Recommendations for follow up therapy are one component of a multi-disciplinary discharge planning process, led by the attending physician.  Recommendations may be updated based on patient status, additional functional criteria and insurance authorization.  Follow Up Recommendations  Acute inpatient rehab (3hours/day)     Assistance Recommended at Discharge Frequent or constant Supervision/Assistance  Equipment Recommendations  Rolling walker (2 wheels);BSC/3in1    Recommendations for Other Services Rehab consult     Precautions / Restrictions Precautions Precautions: Fall Precaution Comments: right sided weakness, ataxia Restrictions Weight Bearing Restrictions: No     Mobility  Bed Mobility Overal bed mobility: Needs Assistance Bed Mobility: Supine to Sit      Supine to sit: Min assist     General bed mobility comments: increased time, minA for trunk elevation, HOB elevated    Transfers Overall transfer level: Needs assistance Equipment used: Rolling walker (2 wheels) Transfers: Sit to/from Stand Sit to Stand: Mod assist           General transfer comment: max verbal cues for hand placement, pt with initial posterior lean on bed, verbal cues to bring chest forward, pt with L lateral lean    Ambulation/Gait Ambulation/Gait assistance: Mod assist;+2 physical assistance;+2 safety/equipment Gait Distance (Feet): 15 Feet (x2 ( from bed to door)) Assistive device: Rolling walker (2 wheels) Gait Pattern/deviations: Step-to pattern;Step-through pattern;Decreased stride length;Decreased weight shift to left;Narrow base of support;Ataxic;Scissoring Gait velocity: dec Gait velocity interpretation: <1.8 ft/sec, indicate of risk for recurrent falls   General Gait Details: pt initially with difficulty clearing R foot requiring max verbal cues. pt also requiring step by step verbal cues to sequencing stepping in RW which improve significantly during the second bout. Pt holds head down and turned to the L due to dizziness/head spinning from stroke, modA for walker management on first bout, minA on second, pt with improved sequencing and ability to clear R foot on second bout as well   Stairs             Wheelchair Mobility    Modified Rankin (Stroke Patients Only) Modified Rankin (Stroke Patients Only) Pre-Morbid Rankin Score: No symptoms Modified Rankin: Moderately severe disability     Balance Overall balance assessment: Needs assistance Sitting-balance support: Feet supported;Bilateral upper extremity supported Sitting balance-[name]: Fair     Standing balance support: Bilateral upper extremity supported Standing balance-[name]:  Poor Standing balance comment: needs external support in standing, total assist for peri  care as he could not let go of RW and keep balance.                            Cognition Arousal/Alertness: Awake/alert Behavior During Therapy: Flat affect Overall Cognitive Status: Impaired/Different from baseline Area of Impairment: Safety/judgement;Awareness;Problem solving;Orientation;Attention;Following commands                 Orientation Level: Disoriented to;Time (required a choice for place but then once said hospital said [facility], aware he had a stroke) Current Attention Level: Sustained   Following Commands: Follows one step commands with increased time Safety/Judgement: Decreased awareness of safety Awareness: Emergent Problem Solving: Difficulty sequencing;Requires verbal cues;Requires tactile cues General Comments: pt less impulsive today, improved command following especially during sequencing stepping while walking        Exercises      General Comments General comments (skin integrity, edema, etc.): VSS      Pertinent Vitals/Pain Pain Assessment: No/denies pain    Home Living                          Prior Function            PT Goals (current goals can now be found in the care plan section) Acute Rehab PT Goals Patient Stated Goal: to walk again PT Goal Formulation: With patient Time For Goal Achievement: 03/30/21 Potential to Achieve Goals: Good Progress towards PT goals: Progressing toward goals    Frequency    Min 4X/week      PT Plan Current plan remains appropriate    Co-evaluation              AM-PAC PT "6 Clicks" Mobility   Outcome Measure  Help needed turning from your back to your side while in a flat bed without using bedrails?: A Little Help needed moving from lying on your back to sitting on the side of a flat bed without using bedrails?: A Little Help needed moving to and from a bed to a chair (including a wheelchair)?: A Lot Help needed standing up from a chair using your arms (e.g.,  wheelchair or bedside chair)?: A Lot Help needed to walk in hospital room?: A Lot Help needed climbing 3-5 steps with a railing? : Total 6 Click Score: 13    End of Session Equipment Utilized During Treatment: Gait belt Activity Tolerance: Patient tolerated treatment well Patient left: with call bell/phone within reach;in chair;with chair alarm set Nurse Communication: Mobility status;Other (comment) (pt needing to have BM) PT Visit Diagnosis: Unsteadiness on feet (R26.81);Muscle weakness (generalized) (M62.81);Difficulty in walking, not elsewhere classified (R26.2);Other symptoms and signs involving the nervous system (R29.898)     Time: 1540-0867 PT Time Calculation (min) (ACUTE ONLY): 23 min  Charges:  $Gait Training: 23-37 mins                     Kittie Plater, PT, DPT Acute Rehabilitation Services Pager #: [phone] Office #: [phone]    Berline Lopes 03/19/2021, 1:40 PM

## 2021-03-20 DIAGNOSIS — I651 Occlusion and stenosis of basilar artery: Secondary | ICD-10-CM | POA: Diagnosis not present

## 2021-03-20 DIAGNOSIS — D649 Anemia, unspecified: Secondary | ICD-10-CM | POA: Diagnosis not present

## 2021-03-20 DIAGNOSIS — I63541 Cerebral infarction due to unspecified occlusion or stenosis of right cerebellar artery: Secondary | ICD-10-CM | POA: Diagnosis not present

## 2021-03-20 DIAGNOSIS — E7800 Pure hypercholesterolemia, unspecified: Secondary | ICD-10-CM | POA: Diagnosis not present

## 2021-03-20 DIAGNOSIS — Z7401 Bed confinement status: Secondary | ICD-10-CM | POA: Diagnosis not present

## 2021-03-20 DIAGNOSIS — E785 Hyperlipidemia, unspecified: Secondary | ICD-10-CM | POA: Diagnosis not present

## 2021-03-20 DIAGNOSIS — I6322 Cerebral infarction due to unspecified occlusion or stenosis of basilar arteries: Secondary | ICD-10-CM | POA: Diagnosis not present

## 2021-03-20 DIAGNOSIS — M6281 Muscle weakness (generalized): Secondary | ICD-10-CM | POA: Diagnosis not present

## 2021-03-20 DIAGNOSIS — I10 Essential (primary) hypertension: Secondary | ICD-10-CM | POA: Diagnosis not present

## 2021-03-20 DIAGNOSIS — M2550 Pain in unspecified joint: Secondary | ICD-10-CM | POA: Diagnosis not present

## 2021-03-20 DIAGNOSIS — R5381 Other malaise: Secondary | ICD-10-CM | POA: Diagnosis not present

## 2021-03-20 DIAGNOSIS — I639 Cerebral infarction, unspecified: Secondary | ICD-10-CM | POA: Diagnosis not present

## 2021-03-20 DIAGNOSIS — J9690 Respiratory failure, unspecified, unspecified whether with hypoxia or hypercapnia: Secondary | ICD-10-CM | POA: Diagnosis not present

## 2021-03-20 LAB — RESP PANEL BY RT-PCR (FLU A&B, COVID) ARPGX2
Influenza A by PCR: NEGATIVE
Influenza B by PCR: NEGATIVE
SARS Coronavirus 2 by RT PCR: NEGATIVE

## 2021-03-20 MED ORDER — AMLODIPINE BESYLATE 10 MG PO TABS: 10.0000 mg | ORAL_TABLET | Freq: Every day | ORAL | 1 refills | Status: DC

## 2021-03-20 MED ORDER — DOCUSATE SODIUM 100 MG PO CAPS: 100.0000 mg | ORAL_CAPSULE | Freq: Two times a day (BID) | ORAL | 0 refills | Status: DC

## 2021-03-20 MED ORDER — ACETAMINOPHEN 325 MG PO TABS
650.0000 mg | ORAL_TABLET | ORAL | 1 refills | Status: DC | PRN
Start: 2021-03-20 — End: 2021-03-20

## 2021-03-20 MED ORDER — ATORVASTATIN CALCIUM 80 MG PO TABS: 80.0000 mg | ORAL_TABLET | Freq: Every day | ORAL | 1 refills | Status: DC

## 2021-03-20 MED ORDER — TICAGRELOR 90 MG PO TABS: 90.0000 mg | ORAL_TABLET | Freq: Two times a day (BID) | ORAL | 1 refills | Status: DC

## 2021-03-20 MED ORDER — ASPIRIN 81 MG PO CHEW: 81.0000 mg | CHEWABLE_TABLET | Freq: Every day | ORAL | 1 refills | Status: DC

## 2021-03-20 MED ORDER — FOLIC ACID 1 MG PO TABS: 1.0000 mg | ORAL_TABLET | Freq: Every day | ORAL | 1 refills | Status: DC

## 2021-03-20 MED ORDER — PROSIGHT PO TABS: 1.0000 | ORAL_TABLET | Freq: Every day | ORAL | 0 refills | Status: AC

## 2021-03-20 MED ORDER — THIAMINE HCL 100 MG PO TABS: 100.0000 mg | ORAL_TABLET | Freq: Every day | ORAL | 1 refills | Status: AC

## 2021-03-20 MED ORDER — PANTOPRAZOLE SODIUM 40 MG PO TBEC: 40.0000 mg | DELAYED_RELEASE_TABLET | Freq: Every day | ORAL | 1 refills | Status: DC

## 2021-03-20 NOTE — Progress Notes (Signed)
Physical Therapy Treatment Patient Details Name: Andre [name] MRN: [id number] DOB: 02/11/1951 Today's Date: 03/20/2021   History of Present Illness 70 y/o male presented to ED on 03/15/21 after being found altered and R sided weakness. Initial CT head negative. 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 S/p thrombectomy on 11/12. Repeat head CT showed acute infarct likely in brainstem and R occipital cortex. Intubated and extubated 11/12. PMH: HTN, prior stroke with residual R weakness, ETOH abuse    PT Comments    Pt progressing well towards all goals both cognitively and functionally. Pt with improved ambulation distance however continues to have ataxia and difficulty sequencing stepping pattern. Pt denies dizziness today however continues to hold head in L rotation. Pt continue to strongly benefit from inpatient rehab however per chart pt with no support at home. Pt will need SNF to achieve safe mod I level of function prior to d/c home.   Recommendations for follow up therapy are one component of a multi-disciplinary discharge planning process, led by the attending physician.  Recommendations may be updated based on patient status, additional functional criteria and insurance authorization.  Follow Up Recommendations  Acute inpatient rehab (3hours/day)     Assistance Recommended at Discharge Frequent or constant Supervision/Assistance  Equipment Recommendations  Rolling walker (2 wheels);BSC/3in1    Recommendations for Other Services Rehab consult     Precautions / Restrictions Precautions Precautions: Fall Precaution Comments: right sided weakness, ataxia Restrictions Weight Bearing Restrictions: No     Mobility  Bed Mobility Overal bed mobility: Needs Assistance Bed Mobility: Supine to Sit     Supine to sit: Min assist     General bed mobility comments: increased time, minA for trunk elevation, HOB elevated    Transfers Overall transfer level: Needs  assistance Equipment used: Rolling walker (2 wheels) Transfers: Sit to/from Stand Sit to Stand: Mod assist           General transfer comment: max verbal cues for hand placement, mod A to power but improved ability to maintain midline vs posterior lean on bed today    Ambulation/Gait Ambulation/Gait assistance: Mod assist Gait Distance (Feet): 60 Feet Assistive device: Rolling walker (2 wheels) Gait Pattern/deviations: Step-through pattern;Decreased stride length;Decreased weight shift to left;Narrow base of support;Ataxic;Scissoring Gait velocity: dec Gait velocity interpretation: <1.8 ft/sec, indicate of risk for recurrent falls   General Gait Details: pt with more reciprocal pattern today and was ability to maintain chest upright however continues to hold head in L rotation requiring freq verbal cues to look straight ahead. Pt continues with ataxia t/o both extremities more difficulty with R LE, pt requiring maxA for walker management and to steady during turning with RW   Stairs             Wheelchair Mobility    Modified Rankin (Stroke Patients Only) Modified Rankin (Stroke Patients Only) Pre-Morbid Rankin Score: No symptoms Modified Rankin: Moderately severe disability     Balance Overall balance assessment: Needs assistance Sitting-balance support: Feet supported;Bilateral upper extremity supported Sitting balance-[name]: Fair     Standing balance support: Bilateral upper extremity supported Standing balance-[name]: Poor Standing balance comment: needs external support in standing, total assist for peri care as he could not let go of RW and keep balance.                            Cognition Arousal/Alertness: Awake/alert Behavior During Therapy: Flat affect Overall Cognitive Status:  Impaired/Different from baseline Area of Impairment: Safety/judgement;Awareness;Problem solving;Orientation;Attention;Following commands                    Current Attention Level: Sustained   Following Commands: Follows one step commands with increased time Safety/Judgement: Decreased awareness of safety Awareness: Emergent Problem Solving: Difficulty sequencing;Requires verbal cues;Requires tactile cues General Comments: pt less impulsive today, improved command following especially during sequencing stepping while walking, pt more conversant and improved effort during therapy        Exercises Other Exercises Other Exercises: worked on squating in walker with assist to [employer] on R side to focus more on R sided strengthening Other Exercises: worked on R LE placement, trying to step on various "X" on the floor in standing    General Comments General comments (skin integrity, edema, etc.): VSS      Pertinent Vitals/Pain Pain Assessment: No/denies pain    Home Living                          Prior Function            PT Goals (current goals can now be found in the care plan section) Acute Rehab PT Goals PT Goal Formulation: With patient Time For Goal Achievement: 03/30/21 Potential to Achieve Goals: Good Progress towards PT goals: Progressing toward goals    Frequency    Min 4X/week      PT Plan Current plan remains appropriate    Co-evaluation              AM-PAC PT "6 Clicks" Mobility   Outcome Measure  Help needed turning from your back to your side while in a flat bed without using bedrails?: A Little Help needed moving from lying on your back to sitting on the side of a flat bed without using bedrails?: A Little Help needed moving to and from a bed to a chair (including a wheelchair)?: A Lot Help needed standing up from a chair using your arms (e.g., wheelchair or bedside chair)?: A Lot Help needed to walk in hospital room?: A Lot Help needed climbing 3-5 steps with a railing? : Total 6 Click Score: 13    End of Session Equipment Utilized During Treatment: Gait belt Activity  Tolerance: Patient tolerated treatment well Patient left: with call bell/phone within reach;in chair;with chair alarm set Nurse Communication: Mobility status;Other (comment) (pt needing to have BM) PT Visit Diagnosis: Unsteadiness on feet (R26.81);Muscle weakness (generalized) (M62.81);Difficulty in walking, not elsewhere classified (R26.2);Other symptoms and signs involving the nervous system (R29.898)     Time: 7121-9758 PT Time Calculation (min) (ACUTE ONLY): 23 min  Charges:  $Gait Training: 8-22 mins $Therapeutic Exercise: 8-22 mins                     Kittie Plater, PT, DPT Acute Rehabilitation Services Pager #: [phone] Office #: [phone]    Berline Lopes 03/20/2021, 12:27 PM

## 2021-03-20 NOTE — Plan of Care (Signed)
  Problem: Education: Goal: Knowledge of General Education information will improve Description: Including pain rating scale, medication(s)/side effects and non-pharmacologic comfort measures Outcome: Adequate for Discharge   Problem: Health Behavior/Discharge Planning: Goal: Ability to manage health-related needs will improve Outcome: Adequate for Discharge   Problem: Clinical Measurements: Goal: Ability to maintain clinical measurements within normal limits will improve Outcome: Adequate for Discharge Goal: Will remain free from infection Outcome: Adequate for Discharge Goal: Diagnostic test results will improve Outcome: Adequate for Discharge Goal: Respiratory complications will improve Outcome: Adequate for Discharge Goal: Cardiovascular complication will be avoided Outcome: Adequate for Discharge   Problem: Activity: Goal: Risk for activity intolerance will decrease Outcome: Adequate for Discharge   Problem: Nutrition: Goal: Adequate nutrition will be maintained Outcome: Adequate for Discharge   Problem: Coping: Goal: Level of anxiety will decrease Outcome: Adequate for Discharge   Problem: Elimination: Goal: Will not experience complications related to bowel motility Outcome: Adequate for Discharge Goal: Will not experience complications related to urinary retention Outcome: Adequate for Discharge   Problem: Pain Managment: Goal: General experience of comfort will improve Outcome: Adequate for Discharge   Problem: Safety: Goal: Ability to remain free from injury will improve Outcome: Adequate for Discharge   Problem: Skin Integrity: Goal: Risk for impaired skin integrity will decrease Outcome: Adequate for Discharge   Problem: Education: Goal: Knowledge of disease or condition will improve Outcome: Adequate for Discharge Goal: Knowledge of secondary prevention will improve (SELECT ALL) Outcome: Adequate for Discharge Goal: Knowledge of patient specific  risk factors will improve (INDIVIDUALIZE FOR PATIENT) Outcome: Adequate for Discharge Goal: Individualized Educational Video(s) Outcome: Adequate for Discharge   Problem: Health Behavior/Discharge Planning: Goal: Ability to manage health-related needs will improve Outcome: Adequate for Discharge   Problem: Self-Care: Goal: Ability to participate in self-care as condition permits will improve Outcome: Adequate for Discharge   Problem: Ischemic Stroke/TIA Tissue Perfusion: Goal: Complications of ischemic stroke/TIA will be minimized Outcome: Adequate for Discharge

## 2021-03-20 NOTE — Plan of Care (Signed)
  Problem: Education: Goal: Knowledge of disease or condition will improve Outcome: Progressing Goal: Knowledge of secondary prevention will improve (SELECT ALL) Outcome: Progressing Goal: Individualized Educational Video(s) Outcome: Progressing   

## 2021-03-20 NOTE — TOC Transition Note (Addendum)
Transition of Care [facility]) - CM/SW Discharge Note   Patient Details  Name: Andre [name] MRN: [id number] Date of Birth: 07-Mar-1951  Transition of Care [facility]) CM/SW Contact:  Pollie Friar, RN Phone Number: 03/20/2021, 3:40 PM   Clinical Narrative:    Patient is discharging to [facility] today. [facility] unable to offer a bed. Pt will transport via [facility]. Bedside RN updated and d/c packet is at the desk.  Room: 105A Number for report; [phone]   Final next level of care: Skilled Nursing Facility Barriers to Discharge: No Barriers Identified   Patient Goals and CMS Choice   CMS Medicare.gov Compare Post Acute Care list provided to:: Patient Choice offered to / list presented to : Patient  Discharge Placement PASRR number recieved: 03/19/21            Patient chooses bed at:  [facility]) Patient to be transferred to facility by: [place] Name of family member notified: Fred--brother Patient and family notified of of transfer: 03/20/21  Discharge Plan and Services In-house Referral: Clinical Social Work Discharge Planning Services: [employer] Consult Post Acute Care Choice: [place]                               Social Determinants of Health (SDOH) Interventions     Readmission Risk Interventions No flowsheet data found.

## 2021-03-20 NOTE — Discharge Summary (Addendum)
Stroke Discharge Summary  Patient ID: Andre [name]   MRN: [id number]      DOB: 1950/05/28  Date of Admission: 03/15/2021 Date of Discharge: 03/20/2021  Attending Physician:  Stroke, Md, MD, Stroke MD Consultant(s):    pulmonary/intensive care dorothy  Patient's PCP:  Maudie Mercury, MD  DISCHARGE DIAGNOSIS:   Stroke: b/l pontine and right PCA scattered infarct s/p basilar artery stenting, secondary to progressive large vessel disease with persistent cocaine abuse, smoker and alcohol abuse  Active Problems:   Hx of stroke   Progressive intracranial vascular stenosis   HTN   HLD   Cocaine abuse   Smoker   Alcohol abuse   Allergies as of 03/20/2021   No Known Allergies      Medication List     STOP taking these medications    aspirin 81 MG EC tablet Replaced by: aspirin 81 MG chewable tablet   clopidogrel 75 MG tablet Commonly known as: PLAVIX   oxyCODONE-acetaminophen 5-325 MG tablet Commonly known as: PERCOCET/ROXICET   polyethylene glycol 17 g packet Commonly known as: MIRALAX / GLYCOLAX   senna-docusate 8.6-50 MG tablet Commonly known as: Senokot-S       TAKE these medications    amLODipine 10 MG tablet Commonly known as: NORVASC Take 1 tablet (10 mg total) by mouth daily. Start taking on: March 21, 2021   aspirin 81 MG chewable tablet Chew 1 tablet (81 mg total) by mouth daily. Start taking on: March 21, 2021 Replaces: aspirin 81 MG EC tablet   atorvastatin 80 MG tablet Commonly known as: LIPITOR Take 1 tablet (80 mg total) by mouth daily. Start taking on: March 21, 2021 What changed: when to take this   folic acid 1 MG tablet Commonly known as: FOLVITE Take 1 tablet (1 mg total) by mouth daily. Start taking on: March 21, 2021   multivitamin Tabs tablet Take 1 tablet by mouth daily. Start taking on: March 21, 2021   thiamine 100 MG tablet Take 1 tablet (100 mg total) by mouth daily. Start taking on: March 21, 2021   ticagrelor 90 MG Tabs tablet Commonly known as: BRILINTA Take 1 tablet (90 mg total) by mouth 2 (two) times daily.        LABORATORY STUDIES CBC    Component Value Date/Time   WBC 7.0 03/18/2021 0248   RBC 4.00 (L) 03/18/2021 0248   HGB 12.0 (L) 03/18/2021 0248   HCT 37.8 (L) 03/18/2021 0248   PLT 158 03/18/2021 0248   MCV 94.5 03/18/2021 0248   MCH 30.0 03/18/2021 0248   MCHC 31.7 03/18/2021 0248   RDW 13.2 03/18/2021 0248   LYMPHSABS 1.2 03/18/2021 0248   MONOABS 0.7 03/18/2021 0248   EOSABS 0.1 03/18/2021 0248   BASOSABS 0.0 03/18/2021 0248   CMP    Component Value Date/Time   NA 136 03/18/2021 0248   K 4.0 03/18/2021 0248   CL 104 03/18/2021 0248   CO2 24 03/18/2021 0248   GLUCOSE 92 03/18/2021 0248   BUN 13 03/18/2021 0248   CREATININE 1.13 03/18/2021 0248   CALCIUM 8.6 (L) 03/18/2021 0248   PROT 5.5 (L) 03/17/2021 0333   ALBUMIN 2.8 (L) 03/17/2021 0333   AST 32 03/17/2021 0333   ALT 11 03/17/2021 0333   ALKPHOS 42 03/17/2021 0333   BILITOT 1.3 (H) 03/17/2021 0333   GFRNONAA >60 03/18/2021 0248   GFRAA >60 12/30/2019 0510   COAGS Lab Results  Component Value Date  INR 1.1 03/16/2021   INR 1.0 12/29/2019   INR 1.03 05/04/2017   Lipid Panel    Component Value Date/Time   CHOL 135 03/16/2021 0453   TRIG 49 03/16/2021 0453   HDL 71 03/16/2021 0453   CHOLHDL 1.9 03/16/2021 0453   VLDL 10 03/16/2021 0453   LDLCALC 54 03/16/2021 0453   HgbA1C  Lab Results  Component Value Date   HGBA1C 4.6 (L) 03/16/2021   Urinalysis    Component Value Date/Time   COLORURINE YELLOW 03/15/2021 2133   APPEARANCEUR HAZY (A) 03/15/2021 2133   LABSPEC 1.023 03/15/2021 2133   PHURINE 5.0 03/15/2021 2133   GLUCOSEU NEGATIVE 03/15/2021 2133   HGBUR NEGATIVE 03/15/2021 2133   BILIRUBINUR NEGATIVE 03/15/2021 2133   KETONESUR NEGATIVE 03/15/2021 2133   PROTEINUR NEGATIVE 03/15/2021 2133   UROBILINOGEN 0.2 03/27/2011 1149   NITRITE NEGATIVE 03/15/2021  2133   LEUKOCYTESUR NEGATIVE 03/15/2021 2133   Urine Drug Screen     Component Value Date/Time   LABOPIA NONE DETECTED 03/15/2021 2133   COCAINSCRNUR POSITIVE (A) 03/15/2021 2133   LABBENZ NONE DETECTED 03/15/2021 2133   AMPHETMU NONE DETECTED 03/15/2021 2133   THCU NONE DETECTED 03/15/2021 2133   LABBARB NONE DETECTED 03/15/2021 2133    Alcohol Level    Component Value Date/Time   ETH 16 (H) 03/15/2021 2145     SIGNIFICANT DIAGNOSTIC STUDIES DG Abd 1 View  Result Date: 03/16/2021 CLINICAL DATA:  Altered mental status. Metal foreign body in abdomen. EXAM: ABDOMEN - 1 VIEW COMPARISON:  None. FINDINGS: Surgical clips in the right upper quadrant. There is no other metallic foreign body or evidence of implanted medical device in the abdomen. Mild gaseous distention of bowel loops without evidence of obstruction. Included lung bases are clear. Multilevel degenerative change in the spine. No acute osseous abnormalities are seen. IMPRESSION: Surgical clips in the right upper quadrant from cholecystectomy. No other metallic foreign body or evidence of implanted medical device in the abdomen. Electronically Signed   By: Keith Rake M.D.   On: 03/16/2021 18:38   CT HEAD WO CONTRAST (5MM)  Result Date: 03/16/2021 CLINICAL DATA:  Stroke follow-up.  Basilar stent placement. EXAM: CT HEAD WITHOUT CONTRAST TECHNIQUE: Contiguous axial images were obtained from the base of the skull through the vertex without intravenous contrast. COMPARISON:  CT and CTA from earlier the same day FINDINGS: Brain: Acute infarct is likely present in the left paramedian brainstem and to a small degree in the right occipital cortex. Remote left inferior cerebellar infarction. Chronic lacunar infarcts. No hemorrhage, hydrocephalus, or collection. Vascular: Atheromatous calcification and mid basilar stent. Skull: Negative Sinuses/Orbits: Negative IMPRESSION: Acute infarct likely in the brainstem and right occipital  cortex. No acute hemorrhage. Electronically Signed   By: Jorje Guild M.D.   On: 03/16/2021 06:58   CT HEAD WO CONTRAST  Result Date: 03/15/2021 CLINICAL DATA:  Mental status change, neck trauma. EXAM: CT HEAD WITHOUT CONTRAST CT CERVICAL SPINE WITHOUT CONTRAST TECHNIQUE: Multidetector CT imaging of the head and cervical spine was performed following the standard protocol without intravenous contrast. Multiplanar CT image reconstructions of the cervical spine were also generated. COMPARISON:  CT head and cervical spine 08/30/2017 FINDINGS: CT HEAD FINDINGS Brain: Chronic left cerebellar and left basal ganglia infarction. No evidence of large-territorial acute infarction. No parenchymal hemorrhage. No mass lesion. No extra-axial collection. No mass effect or midline shift. No hydrocephalus. Basilar cisterns are patent. Vascular: No hyperdense vessel. Atherosclerotic calcifications are present within the cavernous internal carotid  arteries. Skull: No acute fracture or focal lesion. Sinuses/Orbits: Paranasal sinuses and mastoid air cells are clear. The orbits are unremarkable. Other: None. CT CERVICAL SPINE FINDINGS Alignment: Stable grade 1 anterolisthesis of C2 on C3 and C7 on T1. Skull base and vertebrae: Multilevel severe degenerative changes of the spine. Associated multilevel severe neural foraminal stenosis. No severe osseous central canal stenosis. No acute fracture. No aggressive appearing focal osseous lesion or focal pathologic process. Soft tissues and spinal canal: No prevertebral fluid or swelling. No visible canal hematoma. Upper chest: Centrilobular emphysematous changes. Other: None. IMPRESSION: 1. No acute intracranial abnormality. 2. No acute displaced fracture or traumatic listhesis of the cervical spine in a patient with multilevel degenerative change of the spine leading to severe osseous neural foraminal stenosis. 3.  Emphysema (ICD10-J43.9). Electronically Signed   By: Iven Finn  M.D.   On: 03/15/2021 22:35   CT CERVICAL SPINE WO CONTRAST  Result Date: 03/15/2021 CLINICAL DATA:  Mental status change, neck trauma. EXAM: CT HEAD WITHOUT CONTRAST CT CERVICAL SPINE WITHOUT CONTRAST TECHNIQUE: Multidetector CT imaging of the head and cervical spine was performed following the standard protocol without intravenous contrast. Multiplanar CT image reconstructions of the cervical spine were also generated. COMPARISON:  CT head and cervical spine 08/30/2017 FINDINGS: CT HEAD FINDINGS Brain: Chronic left cerebellar and left basal ganglia infarction. No evidence of large-territorial acute infarction. No parenchymal hemorrhage. No mass lesion. No extra-axial collection. No mass effect or midline shift. No hydrocephalus. Basilar cisterns are patent. Vascular: No hyperdense vessel. Atherosclerotic calcifications are present within the cavernous internal carotid arteries. Skull: No acute fracture or focal lesion. Sinuses/Orbits: Paranasal sinuses and mastoid air cells are clear. The orbits are unremarkable. Other: None. CT CERVICAL SPINE FINDINGS Alignment: Stable grade 1 anterolisthesis of C2 on C3 and C7 on T1. Skull base and vertebrae: Multilevel severe degenerative changes of the spine. Associated multilevel severe neural foraminal stenosis. No severe osseous central canal stenosis. No acute fracture. No aggressive appearing focal osseous lesion or focal pathologic process. Soft tissues and spinal canal: No prevertebral fluid or swelling. No visible canal hematoma. Upper chest: Centrilobular emphysematous changes. Other: None. IMPRESSION: 1. No acute intracranial abnormality. 2. No acute displaced fracture or traumatic listhesis of the cervical spine in a patient with multilevel degenerative change of the spine leading to severe osseous neural foraminal stenosis. 3.  Emphysema (ICD10-J43.9). Electronically Signed   By: Iven Finn M.D.   On: 03/15/2021 22:35   MR ANGIO HEAD WO  CONTRAST  Result Date: 03/17/2021 CLINICAL DATA:  Follow-up examination for acute stroke. EXAM: MRI HEAD WITHOUT CONTRAST MRA HEAD WITHOUT CONTRAST TECHNIQUE: Multiplanar, multi-echo pulse sequences of the brain and surrounding structures were acquired without intravenous contrast. Angiographic images of the Circle of Willis were acquired using MRA technique without intravenous contrast. COMPARISON:  Prior studies from 03/16/2021. FINDINGS: MRI HEAD FINDINGS Brain: Moderately advanced cerebral atrophy. Patchy T2/FLAIR hyperintensity within the periventricular and deep white matter both cerebral hemispheres most consistent with chronic small vessel ischemic disease, moderate in nature. Multiple scattered remote lacunar infarcts present about the hemispheric cerebral white matter, basal ganglia, and thalami. Chronic left greater than right bilateral cerebellar infarcts. Few scattered chronic micro hemorrhages noted about the thalami, likely related to chronic poorly controlled hypertension. Patchy small volume acute ischemic infarcts seen involving the peripheral right cerebellum (series 5, image 54), pons (series 5, image 61), right occipital lobe (series 5, images 65, 70, 75), and right thalamus (series 5, image 73). No associated hemorrhage  or mass effect. No other evidence for acute or subacute ischemia. No acute intracranial hemorrhage. No mass lesion, mass effect or midline shift. No hydrocephalus or extra-axial fluid collection. Pituitary gland suprasellar region within normal limits. Midline structures intact. Vascular: Major intracranial vascular flow voids are maintained. Skull and upper cervical spine: Craniocervical junction within normal limits. Bone marrow signal intensity somewhat heterogeneous without focal marrow replacing lesion. No scalp soft tissue abnormality. Sinuses/Orbits: Globes and orbital soft tissues within normal limits. Scattered mucosal thickening noted within the ethmoidal air cells.  Paranasal sinuses are otherwise clear. Trace right mastoid effusion noted, of doubtful significance. Inner ear structures grossly normal. Other: Chronic cystic lesion involving the subcutaneous fat of the lower left face again noted, of doubtful significance. MRA HEAD FINDINGS Anterior circulation: Examination mildly degraded by motion. Visualized distal cervical segments of the internal carotid arteries are patent with antegrade flow. Petrous and cavernous segments patent bilaterally. Moderate stenosis at the supraclinoid left ICA (series 1047, image 19). No significant narrowing about the contralateral supraclinoid right ICA by MRA. Right A1 patent. Left A1 hypoplastic and/or absent, accounting for the diminutive left ICA is compared to the right. Normal anterior communicating artery complex. Anterior cerebral arteries patent without stenosis. M1 segments patent without definite stenosis. Irregularity at the mid left M1 on MIP reconstructions felt to be consistent with motion artifact. No proximal MCA branch occlusion. Distal MCA branches perfused and symmetric. Posterior circulation: Atheromatous irregularity within the V4 segments bilaterally without hemodynamically significant stenosis by MRA. Right vertebral artery slightly dominant. Right PICA patent at its origin. Left PICA not seen. Interval placement of a vascular stent within the basilar artery, extending from the level of the AICAs distally. Grossly patent flow through the stent, although this is not well assessed by MRA. AICA remain perfused. Patent flow seen distally within the basilar artery. Superior cerebral arteries patent bilaterally. Right PCA supplied via the basilar. Fetal type origin left PCA. PCAs remain patent to their distal aspects without high-grade stenosis. Anatomic variants: Fetal type origin of the left PCA. Hypoplastic/absent left A1. IMPRESSION: MRI HEAD IMPRESSION: 1. Patchy small volume acute ischemic infarcts involving the right  cerebellum, pons, right occipital lobe, and right thalamus as above. No associated hemorrhage or mass effect. 2. Underlying chronic microvascular ischemic disease with multiple remote lacunar infarcts about the hemispheric cerebral white matter, deep gray nuclei, and cerebellum. MRA HEAD IMPRESSION: 1. Interval stenting of the basilar artery. Grossly patent flow through the stent, with good perfusion distally. 2. Moderate stenosis at the supraclinoid left ICA. 3. Additional mild intracranial atherosclerotic disease. No other hemodynamically significant stenosis by MRA. Electronically Signed   By: Jeannine Boga M.D.   On: 03/17/2021 04:56   MR BRAIN WO CONTRAST  Result Date: 03/17/2021 CLINICAL DATA:  Follow-up examination for acute stroke. EXAM: MRI HEAD WITHOUT CONTRAST MRA HEAD WITHOUT CONTRAST TECHNIQUE: Multiplanar, multi-echo pulse sequences of the brain and surrounding structures were acquired without intravenous contrast. Angiographic images of the Circle of Willis were acquired using MRA technique without intravenous contrast. COMPARISON:  Prior studies from 03/16/2021. FINDINGS: MRI HEAD FINDINGS Brain: Moderately advanced cerebral atrophy. Patchy T2/FLAIR hyperintensity within the periventricular and deep white matter both cerebral hemispheres most consistent with chronic small vessel ischemic disease, moderate in nature. Multiple scattered remote lacunar infarcts present about the hemispheric cerebral white matter, basal ganglia, and thalami. Chronic left greater than right bilateral cerebellar infarcts. Few scattered chronic micro hemorrhages noted about the thalami, likely related to chronic poorly controlled hypertension. Patchy  small volume acute ischemic infarcts seen involving the peripheral right cerebellum (series 5, image 54), pons (series 5, image 61), right occipital lobe (series 5, images 65, 70, 75), and right thalamus (series 5, image 73). No associated hemorrhage or mass effect.  No other evidence for acute or subacute ischemia. No acute intracranial hemorrhage. No mass lesion, mass effect or midline shift. No hydrocephalus or extra-axial fluid collection. Pituitary gland suprasellar region within normal limits. Midline structures intact. Vascular: Major intracranial vascular flow voids are maintained. Skull and upper cervical spine: Craniocervical junction within normal limits. Bone marrow signal intensity somewhat heterogeneous without focal marrow replacing lesion. No scalp soft tissue abnormality. Sinuses/Orbits: Globes and orbital soft tissues within normal limits. Scattered mucosal thickening noted within the ethmoidal air cells. Paranasal sinuses are otherwise clear. Trace right mastoid effusion noted, of doubtful significance. Inner ear structures grossly normal. Other: Chronic cystic lesion involving the subcutaneous fat of the lower left face again noted, of doubtful significance. MRA HEAD FINDINGS Anterior circulation: Examination mildly degraded by motion. Visualized distal cervical segments of the internal carotid arteries are patent with antegrade flow. Petrous and cavernous segments patent bilaterally. Moderate stenosis at the supraclinoid left ICA (series 1047, image 19). No significant narrowing about the contralateral supraclinoid right ICA by MRA. Right A1 patent. Left A1 hypoplastic and/or absent, accounting for the diminutive left ICA is compared to the right. Normal anterior communicating artery complex. Anterior cerebral arteries patent without stenosis. M1 segments patent without definite stenosis. Irregularity at the mid left M1 on MIP reconstructions felt to be consistent with motion artifact. No proximal MCA branch occlusion. Distal MCA branches perfused and symmetric. Posterior circulation: Atheromatous irregularity within the V4 segments bilaterally without hemodynamically significant stenosis by MRA. Right vertebral artery slightly dominant. Right PICA patent at  its origin. Left PICA not seen. Interval placement of a vascular stent within the basilar artery, extending from the level of the AICAs distally. Grossly patent flow through the stent, although this is not well assessed by MRA. AICA remain perfused. Patent flow seen distally within the basilar artery. Superior cerebral arteries patent bilaterally. Right PCA supplied via the basilar. Fetal type origin left PCA. PCAs remain patent to their distal aspects without high-grade stenosis. Anatomic variants: Fetal type origin of the left PCA. Hypoplastic/absent left A1. IMPRESSION: MRI HEAD IMPRESSION: 1. Patchy small volume acute ischemic infarcts involving the right cerebellum, pons, right occipital lobe, and right thalamus as above. No associated hemorrhage or mass effect. 2. Underlying chronic microvascular ischemic disease with multiple remote lacunar infarcts about the hemispheric cerebral white matter, deep gray nuclei, and cerebellum. MRA HEAD IMPRESSION: 1. Interval stenting of the basilar artery. Grossly patent flow through the stent, with good perfusion distally. 2. Moderate stenosis at the supraclinoid left ICA. 3. Additional mild intracranial atherosclerotic disease. No other hemodynamically significant stenosis by MRA. Electronically Signed   By: Jeannine Boga M.D.   On: 03/17/2021 04:56   IR CT Head Ltd  Result Date: 03/20/2021 INDICATION: Acute onset of mental status changes, speech difficulties and right-sided weakness. Occluded basilar artery on CT angiogram of the head and neck. EXAM: 1. EMERGENT LARGE VESSEL OCCLUSION THROMBOLYSIS (POSTERIOR CIRCULATION) COMPARISON:  CT angiogram of the head and neck of March 16, 2021. MEDICATIONS: Ancef 2 g IV antibiotic was administered within 1 hour of the procedure. ANESTHESIA/SEDATION: General anesthesia. CONTRAST:  Omnipaque 300 approximately 120 mL. FLUOROSCOPY TIME:  Fluoroscopy Time: 54 minutes 6 seconds (2964 mGy). COMPLICATIONS: None immediate.  TECHNIQUE: Following a full explanation  of the procedure along with the potential associated complications, an informed witnessed consent was obtained. The risks of intracranial hemorrhage of 10%, worsening neurological deficit, ventilator dependency, death and inability to revascularize were all reviewed in detail with the patient. The patient was then put under general anesthesia by the Department of Anesthesiology at [facility]. The right groin was prepped and draped in the usual sterile fashion. Thereafter using modified Seldinger technique, transfemoral access into the right common femoral artery was obtained without difficulty. Over a 0.035 inch guidewire an 8 [place] Pinnacle 25 cm sheath was inserted. Through this, and also over a 0.035 inch guidewire a 5 [place] JB 1 catheter was advanced to the aortic arch region and selectively positioned in the left vertebral artery, innominate artery and the right common carotid artery. FINDINGS: The left vertebral artery origin is widely patent. The vessel is seen to opacify to the cranial skull base. Patency is maintained of the left vertebrobasilar junction with significant tapered stenosis of the distal left vertebral artery just proximal to the confluence to form the proximal basilar artery. Opacification is seen of the left anterior inferior cerebellar artery/left posterior-inferior cerebral artery complex. Complete angiographic occlusion is seen of the basilar artery just distal to the origin of the anterior-inferior cerebellar arteries bilaterally. Retrograde opacification of the right vertebrobasilar junction is seen. Innominate arteriogram demonstrates the right subclavian artery proximally and the right common carotid proximally to be widely patent. The right common carotid arteriogram demonstrates the right external carotid artery and its major branches to be widely patent. There is severe stenosis of the right internal carotid artery at the bulb of  approximately 80% associated with an ulcerated plaque at the bulb. More distally, the right internal carotid artery is seen to opacify to the cranial skull base. Opacified portions of the petrous, the cavernous and the supraclinoid segment are widely patent. A right vertebral artery arteriogram demonstrates the origin of the right vertebral artery to be widely patent. More distally, the right vertebral artery opacifies to the cranial skull base with opacification of the right vertebrobasilar junction and the right posterior meningeal artery. Suggestion of an occluded right posteroinferior cerebellar artery is seen with more distal reconstitution via leptomeningeal collaterals arising from the right anterior inferior cerebellar artery/posteroinferior cerebellar artery complex. Again demonstrated is the complete occlusion of the basilar artery just distal to the right anterior inferior cerebellar artery/posteroinferior cerebellar artery complex. PROCEDURE: The diagnostic JB 1 catheter in the distal right subclavian artery was then exchanged over a 035 inch 300 cm Rosen exchange guidewire for an 8 French 80 cm Neuron Max sheath. The guidewire was removed. Good aspiration obtained from the hub of the Neuron Max sheath. This was then gently retrieved just proximal to the origin of the right vertebral artery. Over an 035 inch Roadrunner guidewire, a 5 [place] JB 1 catheter was then advanced coaxially through the Neuron Max sheath into the mid right vertebral artery. The Neuron Max sheath was then gently advanced over the support JB 1 catheter. The guidewire and the support catheter were removed. Good aspiration obtained from the hub of the 8 [place] Neuron Max sheath in the mid right vertebral artery. A control arteriogram performed through the Neuron Max sheath demonstrated excellent distal antegrade flow without stasis or spasms or dissections. Over a 0.035 inch Roadrunner guidewire, a 117 5 [place] Catalyst guide catheter  was then advanced to the distal right vertebral artery at the level of C1. The guidewire was removed. Good aspiration obtained  from the hub of the Catalyst guide catheter. Control arteriogram performed through the Catalyst guide catheter demonstrated no change in the occluded mid basilar artery. Over a 0.014 inch standard Synchro micro guidewire with a moderate J configuration, a 162 cm 021 microcatheter was then advanced in a coaxial manner and with constant heparinized saline infusion to the distal end of the Catalyst guide catheter. With the micro guidewire leading with a J-tip configuration, and a torque device, the occluded portion of the mid basilar artery was then accessed with the micro guidewire to the distal basilar artery followed by the microcatheter. The guidewire was removed. Good aspiration obtained from the hub of the microcatheter. Gentle control arteriogram performed through the microcatheter demonstrated safe positioning of the tip of the microcatheter which was positioned at the origin of the right posterior cerebral artery. A 4 mm x 40 mm Solitaire X retrieval device was then advanced to the distal end of the microcatheter. With slight forward gentle traction with the right hand on the delivery micro guidewire, with the left hand the microcatheter was retrieved unsheathing the retrieval device. The Catalyst guide catheter was then advanced so that it was just inside the proximally occluded portion of the basilar artery. With proximal aspiration applied at the hub of the Catalyst guide catheter with a Penumbra aspiration device, for approximately 3 1/2 minutes, the combination of the retrieval device, and of the Catalyst guide catheter were retrieved and removed. Chunks of clot was seen entangled in the retrieval device and also in the canister. The aspiration was obtained at the hub of the Neuron Max sheath. A control arteriogram performed through the Neuron Max sheath and the distal right  vertebral artery now demonstrated complete revascularization of the basilar artery, the right posterior cerebral arteries, the superior cerebellar arteries and the anterior-inferior cerebellar arteries. 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 This is most likely related to intracranial arteriosclerosis. It was elected to proceed with placement of a rescue stent to maintain revascularization achieved. Over a 0.014 inch standard Synchro micro guidewire with an 021 162 microcatheter inside of a 5 [place] Catalyst guide catheter combination was advanced without difficulty to the proximal basilar artery. Using a torque device, the micro guidewire was advanced without difficulty through the severely stenotic mid basilar artery into the right posterior cerebral artery followed by the microcatheter. The guidewire was removed. Good aspiration obtained from the hub of the microcatheter in the right posterior cerebral artery. Gentle control arteriogram performed through the microcatheter demonstrated safe positioning of the tip of the microcatheter. This in turn was then exchanged for an 014 inch 300 cm Zoom exchange micro guidewire under constant fluoroscopic guidance. A control arteriogram performed through the Catalyst guide catheter in the distal right vertebrobasilar junction demonstrates safe positioning of the distal micro guidewire, with worsening restenosis of the mid basilar artery. A CT of the brain was performed which demonstrated no evidence of intracranial hemorrhage. Prior to this the patient was loaded with aspirin 81 mg, and Brilinta 180 mg via an orogastric tube. Measurements were performed of the basilar artery distal to the severe stenosis, and proximal to it. It was decided to proceed with placement of a 2.25 mm x 15 mm Resolute Onyx balloon mountable stent. This was prepped and purged retrogradely with  heparinized saline infusion, and antegradely with 75% contrast and 25% heparinized saline infusion. Using the rapid exchange technique, the stent delivery  catheter combination was advanced without difficulty to the distal end of the Catalyst guide catheter and positioned with the distal and proximal markers adequate distance from the site of the reocclusion. Control inflation was then performed using micro inflation syringe device via micro tubing to approximately 2.25 mm where it was maintained for approximately 15 seconds. Balloon was deflated and retrieved and removed. Control arteriogram performed through the Catalyst guide catheter in the right vertebral artery demonstrated excellent apposition with flow into the posterior circulation achieving a TICI 3 revascularization. 50 mg of cangrelor was given intravenously followed by 4 hour low dose infusion. Patient was also given intra-arterially 2 mg of Integrilin to ensure no acute aggregation of platelets within the stent. None was observed. Control arteriograms were then performed at 15 and 30 minutes post deployment of the stent. This continued to demonstrate patency of the stent and of the posterior fossa branches. The 5 [place] Catalyst guide catheter, and Neuron Max sheath were then retrieved and removed. Final control arteriogram performed through the Neuron Max sheath just proximal to the origin of the right vertebral artery demonstrated patency of the right vertebral artery to the cranial skull base and also of the basilar artery. The Neuron Max sheath was removed. The 8 French Pinnacle sheath was removed with successful hemostasis at the right groin puncture site with a 7 [place] ExoSeal closure device with quick clot. Distal pulses remained Dopplerable in the right dorsal pedis and the posterior tibial, and the left posterior tibial, without a Dopplerable left dorsalis pedis as per prior to the procedure. The patient was left intubated to protect his  airway. Patient was then transferred to the neuro ICU for post revascularization management. Immediate post revascularization CT of the brain demonstrated no intracranial hemorrhage or hydrocephalus. IMPRESSION: Status post endovascular complete revascularization of occluded mid basilar artery with 1 pass with a 4 mm x 40 mm Solitaire X retrieval device, and contact aspiration achieving a TICI 3 revascularization. Endovascular revascularization of a reocclusion related to a tight atherosclerotic plaque in the mid basilar artery using 2.25 mm x 15 mm Resolute Onyx balloon mountable stent achieving a TICI 3 revascularization. PLAN: Follow-up in the clinic 2 weeks post discharge. Address severe high-grade right internal carotid artery proximal stenosis. Electronically Signed   By: Luanne Bras M.D.   On: 03/20/2021 10:25   IR CT Head Ltd  Result Date: 03/20/2021 INDICATION: Acute onset of mental status changes, speech difficulties and right-sided weakness. Occluded basilar artery on CT angiogram of the head and neck. EXAM: 1. EMERGENT LARGE VESSEL OCCLUSION THROMBOLYSIS (POSTERIOR CIRCULATION) COMPARISON:  CT angiogram of the head and neck of March 16, 2021. MEDICATIONS: Ancef 2 g IV antibiotic was administered within 1 hour of the procedure. ANESTHESIA/SEDATION: General anesthesia. CONTRAST:  Omnipaque 300 approximately 120 mL. FLUOROSCOPY TIME:  Fluoroscopy Time: 54 minutes 6 seconds (2964 mGy). COMPLICATIONS: None immediate. TECHNIQUE: Following a full explanation of the procedure along with the potential associated complications, an informed witnessed consent was obtained. The risks of intracranial hemorrhage of 10%, worsening neurological deficit, ventilator dependency, death and inability to revascularize were all reviewed in detail with the patient. The patient was then put under general anesthesia by the Department of Anesthesiology at [facility]. The right groin was prepped and draped in  the usual sterile fashion. Thereafter using modified Seldinger technique, transfemoral access into the right common femoral artery was obtained without difficulty. Over a 0.035 inch guidewire an 8 [place] Pinnacle 25 cm sheath was inserted.  Through this, and also over a 0.035 inch guidewire a 5 [place] JB 1 catheter was advanced to the aortic arch region and selectively positioned in the left vertebral artery, innominate artery and the right common carotid artery. FINDINGS: The left vertebral artery origin is widely patent. The vessel is seen to opacify to the cranial skull base. Patency is maintained of the left vertebrobasilar junction with significant tapered stenosis of the distal left vertebral artery just proximal to the confluence to form the proximal basilar artery. Opacification is seen of the left anterior inferior cerebellar artery/left posterior-inferior cerebral artery complex. Complete angiographic occlusion is seen of the basilar artery just distal to the origin of the anterior-inferior cerebellar arteries bilaterally. Retrograde opacification of the right vertebrobasilar junction is seen. Innominate arteriogram demonstrates the right subclavian artery proximally and the right common carotid proximally to be widely patent. The right common carotid arteriogram demonstrates the right external carotid artery and its major branches to be widely patent. There is severe stenosis of the right internal carotid artery at the bulb of approximately 80% associated with an ulcerated plaque at the bulb. More distally, the right internal carotid artery is seen to opacify to the cranial skull base. Opacified portions of the petrous, the cavernous and the supraclinoid segment are widely patent. A right vertebral artery arteriogram demonstrates the origin of the right vertebral artery to be widely patent. More distally, the right vertebral artery opacifies to the cranial skull base with opacification of the right  vertebrobasilar junction and the right posterior meningeal artery. Suggestion of an occluded right posteroinferior cerebellar artery is seen with more distal reconstitution via leptomeningeal collaterals arising from the right anterior inferior cerebellar artery/posteroinferior cerebellar artery complex. Again demonstrated is the complete occlusion of the basilar artery just distal to the right anterior inferior cerebellar artery/posteroinferior cerebellar artery complex. PROCEDURE: The diagnostic JB 1 catheter in the distal right subclavian artery was then exchanged over a 035 inch 300 cm Rosen exchange guidewire for an 8 [place] 80 cm Neuron Max sheath. The guidewire was removed. Good aspiration obtained from the hub of the Neuron Max sheath. This was then gently retrieved just proximal to the origin of the right vertebral artery. Over an 035 inch Roadrunner guidewire, a 5 [place] JB 1 catheter was then advanced coaxially through the Neuron Max sheath into the mid right vertebral artery. The Neuron Max sheath was then gently advanced over the support JB 1 catheter. The guidewire and the support catheter were removed. Good aspiration obtained from the hub of the 8 [place] Neuron Max sheath in the mid right vertebral artery. A control arteriogram performed through the Neuron Max sheath demonstrated excellent distal antegrade flow without stasis or spasms or dissections. Over a 0.035 inch Roadrunner guidewire, a 117 5 [place] Catalyst guide catheter was then advanced to the distal right vertebral artery at the level of C1. The guidewire was removed. Good aspiration obtained from the hub of the Catalyst guide catheter. Control arteriogram performed through the Catalyst guide catheter demonstrated no change in the occluded mid basilar artery. Over a 0.014 inch standard Synchro micro guidewire with a moderate J configuration, a 162 cm 021 microcatheter was then advanced in a coaxial manner and with constant heparinized  saline infusion to the distal end of the Catalyst guide catheter. With the micro guidewire leading with a J-tip configuration, and a torque device, the occluded portion of the mid basilar artery was then accessed with the micro guidewire to the distal basilar artery followed by  the microcatheter. The guidewire was removed. Good aspiration obtained from the hub of the microcatheter. Gentle control arteriogram performed through the microcatheter demonstrated safe positioning of the tip of the microcatheter which was positioned at the origin of the right posterior cerebral artery. A 4 mm x 40 mm Solitaire X retrieval device was then advanced to the distal end of the microcatheter. With slight forward gentle traction with the right hand on the delivery micro guidewire, with the left hand the microcatheter was retrieved unsheathing the retrieval device. The Catalyst guide catheter was then advanced so that it was just inside the proximally occluded portion of the basilar artery. With proximal aspiration applied at the hub of the Catalyst guide catheter with a Penumbra aspiration device, for approximately 3 1/2 minutes, the combination of the retrieval device, and of the Catalyst guide catheter were retrieved and removed. Chunks of clot was seen entangled in the retrieval device and also in the canister. The aspiration was obtained at the hub of the Neuron Max sheath. A control arteriogram performed through the Neuron Max sheath and the distal right vertebral artery now demonstrated complete revascularization of the basilar artery, the right posterior cerebral arteries, the superior cerebellar arteries and the anterior-inferior cerebellar arteries. 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 This is most likely related to intracranial arteriosclerosis. It was elected to proceed with placement of a rescue stent to maintain  revascularization achieved. Over a 0.014 inch standard Synchro micro guidewire with an 021 162 microcatheter inside of a 5 [place] Catalyst guide catheter combination was advanced without difficulty to the proximal basilar artery. Using a torque device, the micro guidewire was advanced without difficulty through the severely stenotic mid basilar artery into the right posterior cerebral artery followed by the microcatheter. The guidewire was removed. Good aspiration obtained from the hub of the microcatheter in the right posterior cerebral artery. Gentle control arteriogram performed through the microcatheter demonstrated safe positioning of the tip of the microcatheter. This in turn was then exchanged for an 014 inch 300 cm Zoom exchange micro guidewire under constant fluoroscopic guidance. A control arteriogram performed through the Catalyst guide catheter in the distal right vertebrobasilar junction demonstrates safe positioning of the distal micro guidewire, with worsening restenosis of the mid basilar artery. A CT of the brain was performed which demonstrated no evidence of intracranial hemorrhage. Prior to this the patient was loaded with aspirin 81 mg, and Brilinta 180 mg via an orogastric tube. Measurements were performed of the basilar artery distal to the severe stenosis, and proximal to it. It was decided to proceed with placement of a 2.25 mm x 15 mm Resolute Onyx balloon mountable stent. This was prepped and purged retrogradely with heparinized saline infusion, and antegradely with 75% contrast and 25% heparinized saline infusion. Using the rapid exchange technique, the stent delivery catheter combination was advanced without difficulty to the distal end of the Catalyst guide catheter and positioned with the distal and proximal markers adequate distance from the site of the reocclusion. Control inflation was then performed using micro inflation syringe device via micro tubing to approximately 2.25 mm  where it was maintained for approximately 15 seconds. Balloon was deflated and retrieved and removed. Control arteriogram performed through the Catalyst guide catheter in the right vertebral artery demonstrated excellent apposition with flow into the posterior circulation achieving a TICI 3 revascularization. 50 mg of cangrelor was given intravenously followed by 4  hour low dose infusion. Patient was also given intra-arterially 2 mg of Integrilin to ensure no acute aggregation of platelets within the stent. None was observed. Control arteriograms were then performed at 15 and 30 minutes post deployment of the stent. This continued to demonstrate patency of the stent and of the posterior fossa branches. The 5 [place] Catalyst guide catheter, and Neuron Max sheath were then retrieved and removed. Final control arteriogram performed through the Neuron Max sheath just proximal to the origin of the right vertebral artery demonstrated patency of the right vertebral artery to the cranial skull base and also of the basilar artery. The Neuron Max sheath was removed. The 8 [place] Pinnacle sheath was removed with successful hemostasis at the right groin puncture site with a 7 [place] ExoSeal closure device with quick clot. Distal pulses remained Dopplerable in the right dorsal pedis and the posterior tibial, and the left posterior tibial, without a Dopplerable left dorsalis pedis as per prior to the procedure. The patient was left intubated to protect his airway. Patient was then transferred to the neuro ICU for post revascularization management. Immediate post revascularization CT of the brain demonstrated no intracranial hemorrhage or hydrocephalus. IMPRESSION: Status post endovascular complete revascularization of occluded mid basilar artery with 1 pass with a 4 mm x 40 mm Solitaire X retrieval device, and contact aspiration achieving a TICI 3 revascularization. Endovascular revascularization of a reocclusion related to a  tight atherosclerotic plaque in the mid basilar artery using 2.25 mm x 15 mm Resolute Onyx balloon mountable stent achieving a TICI 3 revascularization. PLAN: Follow-up in the clinic 2 weeks post discharge. Address severe high-grade right internal carotid artery proximal stenosis. Electronically Signed   By: Luanne Bras M.D.   On: 03/20/2021 10:25   DG CHEST PORT 1 VIEW  Result Date: 03/17/2021 CLINICAL DATA:  71 year old male with history of respiratory failure. EXAM: PORTABLE CHEST 1 VIEW COMPARISON:  Chest x-ray 03/16/2021. FINDINGS: Previously noted endotracheal and nasogastric tubes have been removed. Lung volumes are normal. No consolidative airspace disease. No pleural effusions. No pneumothorax. No pulmonary nodule or mass noted. Pulmonary vasculature and the cardiomediastinal silhouette are within normal limits. Multiple old right-sided posterolateral rib fractures are again noted. IMPRESSION: 1.  No radiographic evidence of acute cardiopulmonary disease. Electronically Signed   By: Vinnie Langton M.D.   On: 03/17/2021 08:07   DG CHEST PORT 1 VIEW  Result Date: 03/16/2021 CLINICAL DATA:  Intubation an orogastric tube placement. EXAM: PORTABLE CHEST 1 VIEW COMPARISON:  02/12/2021. FINDINGS: The heart size and mediastinal contours are within normal limits. Both lungs are clear. Old healed rib fractures are noted bilaterally. An enteric tube courses over the left upper quadrant and out of the field of view. The side port projects over the anticipated region of the stomach. The endotracheal tube terminates 3.3 cm above the carina. IMPRESSION: 1. No acute cardiopulmonary process. 2. Medical devices as described above. Electronically Signed   By: Brett Fairy M.D.   On: 03/16/2021 04:29   DG Chest Port 1 View  Result Date: 03/15/2021 CLINICAL DATA:  Question aspiration. Found on ground at side of apartment. Gurgling respirations. EXAM: PORTABLE CHEST 1 VIEW COMPARISON:  Radiographs  10/29/2020 FINDINGS: The cardiomediastinal contours are normal. The lungs are clear. Pulmonary vasculature is normal. No consolidation, pleural effusion, or pneumothorax. Right posterior rib fractures are remote, they were acute on prior exam. There also remote left rib fractures. No acute osseous abnormalities are seen. IMPRESSION: 1. No acute chest findings.  No radiographic findings to suggest aspiration. 2. Remote bilateral rib fractures. Electronically Signed   By: Keith Rake M.D.   On: 03/15/2021 22:08   ECHOCARDIOGRAM COMPLETE  Result Date: 03/16/2021    ECHOCARDIOGRAM REPORT   Patient Name:   ICHOLAS IRBY Date of Exam: 03/16/2021 Medical Rec #:  [id number]          Height:       72.0 in Accession #:    [id number]         Weight:       160.1 lb Date of Birth:  [birth year]          BSA:          1.938 m Patient Age:    30 years           BP:           112/85 mmHg Patient Gender: M                  HR:           84 bpm. Exam Location:  Inpatient Procedure: 2D Echo, Cardiac Doppler and Color Doppler Indications:    Stroke  History:        Patient has prior history of Echocardiogram examinations. Risk                 Factors:Hypertension.  Sonographer:    Jyl Heinz Referring Phys: [id number] [place]  1. Left ventricular ejection fraction, by estimation, is 50 to 55%. The left ventricle has low normal function. The left ventricle has no regional wall motion abnormalities. There is mild left ventricular hypertrophy. Left ventricular diastolic parameters are indeterminate.  2. Right ventricular systolic function is normal. The right ventricular size is normal. There is normal pulmonary artery systolic pressure. The estimated right ventricular systolic pressure is 37.8 mmHg.  3. The mitral valve is abnormal. Trivial mitral valve regurgitation.  4. The aortic valve is tricuspid. Aortic valve regurgitation is trivial. Aortic valve sclerosis/calcification is present, without any  evidence of aortic stenosis.  5. The inferior vena cava is normal in size with <50% respiratory variability, suggesting right atrial pressure of 8 mmHg. Comparison(s): No prior Echocardiogram. FINDINGS  Left Ventricle: Left ventricular ejection fraction, by estimation, is 50 to 55%. The left ventricle has low normal function. The left ventricle has no regional wall motion abnormalities. The left ventricular internal cavity size was normal in size. There is mild left ventricular hypertrophy. Left ventricular diastolic parameters are indeterminate. Right Ventricle: The right ventricular size is normal. No increase in right ventricular wall thickness. Right ventricular systolic function is normal. There is normal pulmonary artery systolic pressure. The tricuspid regurgitant velocity is 2.23 m/s, and  with an assumed right atrial pressure of 8 mmHg, the estimated right ventricular systolic pressure is 58.8 mmHg. Left Atrium: Left atrial size was normal in size. Right Atrium: Right atrial size was normal in size. Pericardium: There is no evidence of pericardial effusion. Mitral Valve: The mitral valve is abnormal. There is mild thickening of the mitral valve leaflet(s). Trivial mitral valve regurgitation. Tricuspid Valve: The tricuspid valve is grossly normal. Tricuspid valve regurgitation is mild. Aortic Valve: The aortic valve is tricuspid. There is mild aortic valve annular calcification. Aortic valve regurgitation is trivial. Aortic valve sclerosis/calcification is present, without any evidence of aortic stenosis. Aortic valve peak gradient measures 9.6 mmHg. Pulmonic Valve: The pulmonic valve was grossly normal. Pulmonic valve regurgitation is trivial. Aorta: The aortic root  is normal in size and structure. Venous: The inferior vena cava is normal in size with less than 50% respiratory variability, suggesting right atrial pressure of 8 mmHg. IAS/Shunts: No atrial level shunt detected by color flow Doppler.  LEFT  VENTRICLE PLAX 2D LVIDd:         4.70 cm     Diastology LVIDs:         3.30 cm     LV e' medial:    6.74 cm/s LV PW:         1.10 cm     LV E/e' medial:  8.1 LV IVS:        1.10 cm     LV e' lateral:   6.09 cm/s LVOT diam:     2.60 cm     LV E/e' lateral: 9.0 LV SV:         89 LV SV Index:   46 LVOT Area:     5.31 cm  LV Volumes (MOD) LV vol d, MOD A2C: 97.4 ml LV vol d, MOD A4C: 86.4 ml LV vol s, MOD A2C: 46.6 ml LV vol s, MOD A4C: 41.6 ml LV SV MOD A2C:     50.8 ml LV SV MOD A4C:     86.4 ml LV SV MOD BP:      48.6 ml RIGHT VENTRICLE             IVC RV Basal diam:  3.30 cm     IVC diam: 2.00 cm RV Mid diam:    2.40 cm RV S prime:     12.00 cm/s TAPSE (M-mode): 2.1 cm LEFT ATRIUM           Index        RIGHT ATRIUM           Index LA diam:      3.10 cm 1.60 cm/m   RA Area:     10.40 cm LA Vol (A2C): 12.0 ml 6.19 ml/m   RA Volume:   19.80 ml  10.22 ml/m LA Vol (A4C): 53.5 ml 27.60 ml/m  AORTIC VALVE AV Area (Vmax): 3.23 cm AV Vmax:        155.00 cm/s AV Peak Grad:   9.6 mmHg LVOT Vmax:      94.20 cm/s LVOT Vmean:     71.400 cm/s LVOT VTI:       0.167 m  AORTA Ao Root diam: 3.20 cm MITRAL VALVE               TRICUSPID VALVE MV Area (PHT): 3.91 cm    TR Peak grad:   19.9 mmHg MV Decel Time: 194 msec    TR Vmax:        223.00 cm/s MV E velocity: 54.70 cm/s MV A velocity: 68.90 cm/s  SHUNTS MV E/A ratio:  0.79        Systemic VTI:  0.17 m                            Systemic Diam: 2.60 cm Rozann Lesches MD Electronically signed by Rozann Lesches MD Signature Date/Time: 03/16/2021/12:07:57 PM    Final    IR PERCUTANEOUS ART THROMBECTOMY/INFUSION INTRACRANIAL INC DIAG ANGIO  Result Date: 03/20/2021 INDICATION: Acute onset of mental status changes, speech difficulties and right-sided weakness. Occluded basilar artery on CT angiogram of the head and neck. EXAM: 1. EMERGENT LARGE VESSEL OCCLUSION THROMBOLYSIS (POSTERIOR CIRCULATION) COMPARISON:  CT angiogram of  the head and neck of March 16, 2021.  MEDICATIONS: Ancef 2 g IV antibiotic was administered within 1 hour of the procedure. ANESTHESIA/SEDATION: General anesthesia. CONTRAST:  Omnipaque 300 approximately 120 mL. FLUOROSCOPY TIME:  Fluoroscopy Time: 54 minutes 6 seconds (2964 mGy). COMPLICATIONS: None immediate. TECHNIQUE: Following a full explanation of the procedure along with the potential associated complications, an informed witnessed consent was obtained. The risks of intracranial hemorrhage of 10%, worsening neurological deficit, ventilator dependency, death and inability to revascularize were all reviewed in detail with the patient. The patient was then put under general anesthesia by the Department of Anesthesiology at [facility]. The right groin was prepped and draped in the usual sterile fashion. Thereafter using modified Seldinger technique, transfemoral access into the right common femoral artery was obtained without difficulty. Over a 0.035 inch guidewire an 8 [place] Pinnacle 25 cm sheath was inserted. Through this, and also over a 0.035 inch guidewire a 5 [place] JB 1 catheter was advanced to the aortic arch region and selectively positioned in the left vertebral artery, innominate artery and the right common carotid artery. FINDINGS: The left vertebral artery origin is widely patent. The vessel is seen to opacify to the cranial skull base. Patency is maintained of the left vertebrobasilar junction with significant tapered stenosis of the distal left vertebral artery just proximal to the confluence to form the proximal basilar artery. Opacification is seen of the left anterior inferior cerebellar artery/left posterior-inferior cerebral artery complex. Complete angiographic occlusion is seen of the basilar artery just distal to the origin of the anterior-inferior cerebellar arteries bilaterally. Retrograde opacification of the right vertebrobasilar junction is seen. Innominate arteriogram demonstrates the right subclavian artery  proximally and the right common carotid proximally to be widely patent. The right common carotid arteriogram demonstrates the right external carotid artery and its major branches to be widely patent. There is severe stenosis of the right internal carotid artery at the bulb of approximately 80% associated with an ulcerated plaque at the bulb. More distally, the right internal carotid artery is seen to opacify to the cranial skull base. Opacified portions of the petrous, the cavernous and the supraclinoid segment are widely patent. A right vertebral artery arteriogram demonstrates the origin of the right vertebral artery to be widely patent. More distally, the right vertebral artery opacifies to the cranial skull base with opacification of the right vertebrobasilar junction and the right posterior meningeal artery. Suggestion of an occluded right posteroinferior cerebellar artery is seen with more distal reconstitution via leptomeningeal collaterals arising from the right anterior inferior cerebellar artery/posteroinferior cerebellar artery complex. Again demonstrated is the complete occlusion of the basilar artery just distal to the right anterior inferior cerebellar artery/posteroinferior cerebellar artery complex. PROCEDURE: The diagnostic JB 1 catheter in the distal right subclavian artery was then exchanged over a 035 inch 300 cm Rosen exchange guidewire for an 8 French 80 cm Neuron Max sheath. The guidewire was removed. Good aspiration obtained from the hub of the Neuron Max sheath. This was then gently retrieved just proximal to the origin of the right vertebral artery. Over an 035 inch Roadrunner guidewire, a 5 [place] JB 1 catheter was then advanced coaxially through the Neuron Max sheath into the mid right vertebral artery. The Neuron Max sheath was then gently advanced over the support JB 1 catheter. The guidewire and the support catheter were removed. Good aspiration obtained from the hub of the 8 [place]  Neuron Max sheath in the mid right vertebral artery. A control  arteriogram performed through the Neuron Max sheath demonstrated excellent distal antegrade flow without stasis or spasms or dissections. Over a 0.035 inch Roadrunner guidewire, a 117 5 [place] Catalyst guide catheter was then advanced to the distal right vertebral artery at the level of C1. The guidewire was removed. Good aspiration obtained from the hub of the Catalyst guide catheter. Control arteriogram performed through the Catalyst guide catheter demonstrated no change in the occluded mid basilar artery. Over a 0.014 inch standard Synchro micro guidewire with a moderate J configuration, a 162 cm 021 microcatheter was then advanced in a coaxial manner and with constant heparinized saline infusion to the distal end of the Catalyst guide catheter. With the micro guidewire leading with a J-tip configuration, and a torque device, the occluded portion of the mid basilar artery was then accessed with the micro guidewire to the distal basilar artery followed by the microcatheter. The guidewire was removed. Good aspiration obtained from the hub of the microcatheter. Gentle control arteriogram performed through the microcatheter demonstrated safe positioning of the tip of the microcatheter which was positioned at the origin of the right posterior cerebral artery. A 4 mm x 40 mm Solitaire X retrieval device was then advanced to the distal end of the microcatheter. With slight forward gentle traction with the right hand on the delivery micro guidewire, with the left hand the microcatheter was retrieved unsheathing the retrieval device. The Catalyst guide catheter was then advanced so that it was just inside the proximally occluded portion of the basilar artery. With proximal aspiration applied at the hub of the Catalyst guide catheter with a Penumbra aspiration device, for approximately 3 1/2 minutes, the combination of the retrieval device, and of the Catalyst  guide catheter were retrieved and removed. Chunks of clot was seen entangled in the retrieval device and also in the canister. The aspiration was obtained at the hub of the Neuron Max sheath. A control arteriogram performed through the Neuron Max sheath and the distal right vertebral artery now demonstrated complete revascularization of the basilar artery, the right posterior cerebral arteries, the superior cerebellar arteries and the anterior-inferior cerebellar arteries. 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 This is most likely related to intracranial arteriosclerosis. It was elected to proceed with placement of a rescue stent to maintain revascularization achieved. Over a 0.014 inch standard Synchro micro guidewire with an 021 162 microcatheter inside of a 5 [place] Catalyst guide catheter combination was advanced without difficulty to the proximal basilar artery. Using a torque device, the micro guidewire was advanced without difficulty through the severely stenotic mid basilar artery into the right posterior cerebral artery followed by the microcatheter. The guidewire was removed. Good aspiration obtained from the hub of the microcatheter in the right posterior cerebral artery. Gentle control arteriogram performed through the microcatheter demonstrated safe positioning of the tip of the microcatheter. This in turn was then exchanged for an 014 inch 300 cm Zoom exchange micro guidewire under constant fluoroscopic guidance. A control arteriogram performed through the Catalyst guide catheter in the distal right vertebrobasilar junction demonstrates safe positioning of the distal micro guidewire, with worsening restenosis of the mid basilar artery. A CT of the brain was performed which demonstrated no evidence of intracranial hemorrhage. Prior to this the patient was loaded with aspirin 81 mg, and Brilinta 180 mg  via an orogastric tube. Measurements were performed of the basilar artery distal to the severe  stenosis, and proximal to it. It was decided to proceed with placement of a 2.25 mm x 15 mm Resolute Onyx balloon mountable stent. This was prepped and purged retrogradely with heparinized saline infusion, and antegradely with 75% contrast and 25% heparinized saline infusion. Using the rapid exchange technique, the stent delivery catheter combination was advanced without difficulty to the distal end of the Catalyst guide catheter and positioned with the distal and proximal markers adequate distance from the site of the reocclusion. Control inflation was then performed using micro inflation syringe device via micro tubing to approximately 2.25 mm where it was maintained for approximately 15 seconds. Balloon was deflated and retrieved and removed. Control arteriogram performed through the Catalyst guide catheter in the right vertebral artery demonstrated excellent apposition with flow into the posterior circulation achieving a TICI 3 revascularization. 50 mg of cangrelor was given intravenously followed by 4 hour low dose infusion. Patient was also given intra-arterially 2 mg of Integrilin to ensure no acute aggregation of platelets within the stent. None was observed. Control arteriograms were then performed at 15 and 30 minutes post deployment of the stent. This continued to demonstrate patency of the stent and of the posterior fossa branches. The 5 [place] Catalyst guide catheter, and Neuron Max sheath were then retrieved and removed. Final control arteriogram performed through the Neuron Max sheath just proximal to the origin of the right vertebral artery demonstrated patency of the right vertebral artery to the cranial skull base and also of the basilar artery. The Neuron Max sheath was removed. The 8 [place] Pinnacle sheath was removed with successful hemostasis at the right groin puncture site with a 7 [place] ExoSeal  closure device with quick clot. Distal pulses remained Dopplerable in the right dorsal pedis and the posterior tibial, and the left posterior tibial, without a Dopplerable left dorsalis pedis as per prior to the procedure. The patient was left intubated to protect his airway. Patient was then transferred to the neuro ICU for post revascularization management. Immediate post revascularization CT of the brain demonstrated no intracranial hemorrhage or hydrocephalus. IMPRESSION: Status post endovascular complete revascularization of occluded mid basilar artery with 1 pass with a 4 mm x 40 mm Solitaire X retrieval device, and contact aspiration achieving a TICI 3 revascularization. Endovascular revascularization of a reocclusion related to a tight atherosclerotic plaque in the mid basilar artery using 2.25 mm x 15 mm Resolute Onyx balloon mountable stent achieving a TICI 3 revascularization. PLAN: Follow-up in the clinic 2 weeks post discharge. Address severe high-grade right internal carotid artery proximal stenosis. Electronically Signed   By: Luanne Bras M.D.   On: 03/20/2021 10:25   CT ANGIO HEAD CODE STROKE  Result Date: 03/16/2021 CLINICAL DATA:  Initial evaluation for acute stroke. EXAM: CT ANGIOGRAPHY HEAD AND NECK TECHNIQUE: Multidetector CT imaging of the head and neck was performed using the standard protocol during bolus administration of intravenous contrast. Multiplanar CT image reconstructions and MIPs were obtained to evaluate the vascular anatomy. Carotid stenosis measurements (when applicable) are obtained utilizing NASCET criteria, using the distal internal carotid diameter as the denominator. CONTRAST:  66mL OMNIPAQUE IOHEXOL 350 MG/ML SOLN COMPARISON:  CT from 03/15/2021 as well as prior CTA from 05/04/2017. FINDINGS: CTA NECK FINDINGS Aortic arch: Partially visualized aortic arch in origin of the great vessels grossly normal in caliber. No visible stenosis about the origin of the great  vessels. Right carotid system: Right CCA patent from its origin to the bifurcation without stenosis. Bulky calcified plaque at  the right carotid bulb with associated stenosis of up to 80% by NASCET criteria (series 7, image 222). Right ICA patent distally without stenosis, dissection or occlusion. Left carotid system: Left CCA patent from its origin to the bifurcation without stenosis. Eccentric mixed plaque at the left carotid bulb with associated stenosis of up to 80% by NASCET criteria (series 7, image 229). Left ICA patent distally without stenosis dissection or occlusion. Vertebral arteries: Both vertebral arteries arise from the subclavian arteries. No visible proximal subclavian artery stenosis. Right vertebral artery slightly dominant. Vertebral arteries patent within the neck without stenosis dissection or occlusion. Skeleton: No discrete or worrisome osseous lesions. Moderate to advanced multilevel spondylosis noted throughout the cervical spine. Postsurgical changes noted at the mandible. Other neck: Multiple hypodense/cystic lesions noted within the subcutaneous fat of the upper face/neck, greater on the left. The largest of these seen on the left and measures 2.8 cm in size (series 5, image 97). Few small foci of gas seen within this lesion. Additional lesion seen slightly inferiorly (series 5, image 112). Focal calcifications seen within a 1.2 cm lesion on the right (series 5, image 115). Findings are most likely benign in reflect sebaceous cysts, and are similar as compared to 2018. No other acute soft tissue abnormality within the neck. Upper chest: Emphysema. Visualized upper chest demonstrates no other acute finding. Review of the MIP images confirms the above findings CTA HEAD FINDINGS Anterior circulation: Petrous segments patent bilaterally. Atheromatous plaque within the carotid siphons bilaterally with associated moderate stenosis at the supraclinoid right ICA (series 7, image 110). Focal  severe stenosis seen at the contralateral supraclinoid left ICA (series 7, image 113). Right A1 patent. Left A1 hypoplastic and/or absent. Normal anterior communicating artery complex. Anterior cerebral arteries remain patent to their distal aspects without stenosis. Right M1 widely patent. Focal moderate distal left M1 stenosis (series 10, image 22). Normal MCA bifurcations. Distal MCA branches perfused and symmetric. Posterior circulation: Extensive atheromatous disease throughout the V4 segments bilaterally with associated moderate multifocal narrowing on the right, with moderate to severe multifocal narrowing on the left. Distal left V4 segment nearly occludes prior to the vertebrobasilar junction. Right PICA origin remains patent. Left PICA not definitely seen. There is occlusion of the mid basilar artery distally, concerning for occlusive thrombus (series 8, image 110). Perfusion of the distal basilar artery distally, likely collateral. Superior cerebellar arteries perfused and patent bilaterally. Right PCA supplied via the basilar. Multifocal atheromatous irregularity throughout the right PCA without high-grade stenosis. Fetal type origin of the left PCA which remains patent to its distal aspect. Venous sinuses: Patent allowing for timing the contrast bolus. Anatomic variants: Fetal type origin of the left PCA. No visible aneurysm. Review of the MIP images confirms the above findings IMPRESSION: 1. 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 Basilar artery is perfused distally, likely collateral in nature. Both SCAs and PCAs remain patent and perfused at this time. 2. Extensive atheromatous disease throughout the V4 segments bilaterally with associated moderate to severe multifocal narrowing, left worse than right. 3. Severe atheromatous stenoses of up to 80% at the carotid bifurcations bilaterally. 4. Moderate stenosis at the supraclinoid right ICA, with severe stenosis at the supraclinoid left ICA. 5.  Moderate distal left M1 stenosis. 6. Emphysema (ICD10-J43.9). Critical Value/emergent results were called by telephone at the time of interpretation on 03/16/2021 at 12:10 a.m. to provider [facility] , who verbally acknowledged these results. Electronically Signed   By: Jeannine Boga M.D.   On:  03/16/2021 01:43   CT ANGIO NECK CODE STROKE  Result Date: 03/16/2021 CLINICAL DATA:  Initial evaluation for acute stroke. EXAM: CT ANGIOGRAPHY HEAD AND NECK TECHNIQUE: Multidetector CT imaging of the head and neck was performed using the standard protocol during bolus administration of intravenous contrast. Multiplanar CT image reconstructions and MIPs were obtained to evaluate the vascular anatomy. Carotid stenosis measurements (when applicable) are obtained utilizing NASCET criteria, using the distal internal carotid diameter as the denominator. CONTRAST:  62mL OMNIPAQUE IOHEXOL 350 MG/ML SOLN COMPARISON:  CT from 03/15/2021 as well as prior CTA from 05/04/2017. FINDINGS: CTA NECK FINDINGS Aortic arch: Partially visualized aortic arch in origin of the great vessels grossly normal in caliber. No visible stenosis about the origin of the great vessels. Right carotid system: Right CCA patent from its origin to the bifurcation without stenosis. Bulky calcified plaque at the right carotid bulb with associated stenosis of up to 80% by NASCET criteria (series 7, image 222). Right ICA patent distally without stenosis, dissection or occlusion. Left carotid system: Left CCA patent from its origin to the bifurcation without stenosis. Eccentric mixed plaque at the left carotid bulb with associated stenosis of up to 80% by NASCET criteria (series 7, image 229). Left ICA patent distally without stenosis dissection or occlusion. Vertebral arteries: Both vertebral arteries arise from the subclavian arteries. No visible proximal subclavian artery stenosis. Right vertebral artery slightly dominant. Vertebral arteries  patent within the neck without stenosis dissection or occlusion. Skeleton: No discrete or worrisome osseous lesions. Moderate to advanced multilevel spondylosis noted throughout the cervical spine. Postsurgical changes noted at the mandible. Other neck: Multiple hypodense/cystic lesions noted within the subcutaneous fat of the upper face/neck, greater on the left. The largest of these seen on the left and measures 2.8 cm in size (series 5, image 97). Few small foci of gas seen within this lesion. Additional lesion seen slightly inferiorly (series 5, image 112). Focal calcifications seen within a 1.2 cm lesion on the right (series 5, image 115). Findings are most likely benign in reflect sebaceous cysts, and are similar as compared to 2018. No other acute soft tissue abnormality within the neck. Upper chest: Emphysema. Visualized upper chest demonstrates no other acute finding. Review of the MIP images confirms the above findings CTA HEAD FINDINGS Anterior circulation: Petrous segments patent bilaterally. Atheromatous plaque within the carotid siphons bilaterally with associated moderate stenosis at the supraclinoid right ICA (series 7, image 110). Focal severe stenosis seen at the contralateral supraclinoid left ICA (series 7, image 113). Right A1 patent. Left A1 hypoplastic and/or absent. Normal anterior communicating artery complex. Anterior cerebral arteries remain patent to their distal aspects without stenosis. Right M1 widely patent. Focal moderate distal left M1 stenosis (series 10, image 22). Normal MCA bifurcations. Distal MCA branches perfused and symmetric. Posterior circulation: Extensive atheromatous disease throughout the V4 segments bilaterally with associated moderate multifocal narrowing on the right, with moderate to severe multifocal narrowing on the left. Distal left V4 segment nearly occludes prior to the vertebrobasilar junction. Right PICA origin remains patent. Left PICA not definitely seen.  There is occlusion of the mid basilar artery distally, concerning for occlusive thrombus (series 8, image 110). Perfusion of the distal basilar artery distally, likely collateral. Superior cerebellar arteries perfused and patent bilaterally. Right PCA supplied via the basilar. Multifocal atheromatous irregularity throughout the right PCA without high-grade stenosis. Fetal type origin of the left PCA which remains patent to its distal aspect. Venous sinuses: Patent allowing for timing the contrast  bolus. Anatomic variants: Fetal type origin of the left PCA. No visible aneurysm. Review of the MIP images confirms the above findings IMPRESSION: 1. 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 Basilar artery is perfused distally, likely collateral in nature. Both SCAs and PCAs remain patent and perfused at this time. 2. Extensive atheromatous disease throughout the V4 segments bilaterally with associated moderate to severe multifocal narrowing, left worse than right. 3. Severe atheromatous stenoses of up to 80% at the carotid bifurcations bilaterally. 4. Moderate stenosis at the supraclinoid right ICA, with severe stenosis at the supraclinoid left ICA. 5. Moderate distal left M1 stenosis. 6. Emphysema (ICD10-J43.9). Critical Value/emergent results were called by telephone at the time of interpretation on 03/16/2021 at 12:10 a.m. to provider [facility] , who verbally acknowledged these results. Electronically Signed   By: Jeannine Boga M.D.   On: 03/16/2021 01:43   IR ANGIO INTRA EXTRACRAN SEL COM CAROTID INNOMINATE UNI R MOD SED  Result Date: 03/20/2021 INDICATION: Acute onset of mental status changes, speech difficulties and right-sided weakness. Occluded basilar artery on CT angiogram of the head and neck. EXAM: 1. EMERGENT LARGE VESSEL OCCLUSION THROMBOLYSIS (POSTERIOR CIRCULATION) COMPARISON:  CT angiogram of the head and neck of March 16, 2021. MEDICATIONS: Ancef 2 g IV antibiotic was  administered within 1 hour of the procedure. ANESTHESIA/SEDATION: General anesthesia. CONTRAST:  Omnipaque 300 approximately 120 mL. FLUOROSCOPY TIME:  Fluoroscopy Time: 54 minutes 6 seconds (2964 mGy). COMPLICATIONS: None immediate. TECHNIQUE: Following a full explanation of the procedure along with the potential associated complications, an informed witnessed consent was obtained. The risks of intracranial hemorrhage of 10%, worsening neurological deficit, ventilator dependency, death and inability to revascularize were all reviewed in detail with the patient. The patient was then put under general anesthesia by the Department of Anesthesiology at [facility]. The right groin was prepped and draped in the usual sterile fashion. Thereafter using modified Seldinger technique, transfemoral access into the right common femoral artery was obtained without difficulty. Over a 0.035 inch guidewire an 8 [place] Pinnacle 25 cm sheath was inserted. Through this, and also over a 0.035 inch guidewire a 5 [place] JB 1 catheter was advanced to the aortic arch region and selectively positioned in the left vertebral artery, innominate artery and the right common carotid artery. FINDINGS: The left vertebral artery origin is widely patent. The vessel is seen to opacify to the cranial skull base. Patency is maintained of the left vertebrobasilar junction with significant tapered stenosis of the distal left vertebral artery just proximal to the confluence to form the proximal basilar artery. Opacification is seen of the left anterior inferior cerebellar artery/left posterior-inferior cerebral artery complex. Complete angiographic occlusion is seen of the basilar artery just distal to the origin of the anterior-inferior cerebellar arteries bilaterally. Retrograde opacification of the right vertebrobasilar junction is seen. Innominate arteriogram demonstrates the right subclavian artery proximally and the right common carotid  proximally to be widely patent. The right common carotid arteriogram demonstrates the right external carotid artery and its major branches to be widely patent. There is severe stenosis of the right internal carotid artery at the bulb of approximately 80% associated with an ulcerated plaque at the bulb. More distally, the right internal carotid artery is seen to opacify to the cranial skull base. Opacified portions of the petrous, the cavernous and the supraclinoid segment are widely patent. A right vertebral artery arteriogram demonstrates the origin of the right vertebral artery to be widely patent. More distally, the right  vertebral artery opacifies to the cranial skull base with opacification of the right vertebrobasilar junction and the right posterior meningeal artery. Suggestion of an occluded right posteroinferior cerebellar artery is seen with more distal reconstitution via leptomeningeal collaterals arising from the right anterior inferior cerebellar artery/posteroinferior cerebellar artery complex. Again demonstrated is the complete occlusion of the basilar artery just distal to the right anterior inferior cerebellar artery/posteroinferior cerebellar artery complex. PROCEDURE: The diagnostic JB 1 catheter in the distal right subclavian artery was then exchanged over a 035 inch 300 cm Rosen exchange guidewire for an 8 French 80 cm Neuron Max sheath. The guidewire was removed. Good aspiration obtained from the hub of the Neuron Max sheath. This was then gently retrieved just proximal to the origin of the right vertebral artery. Over an 035 inch Roadrunner guidewire, a 5 [place] JB 1 catheter was then advanced coaxially through the Neuron Max sheath into the mid right vertebral artery. The Neuron Max sheath was then gently advanced over the support JB 1 catheter. The guidewire and the support catheter were removed. Good aspiration obtained from the hub of the 8 [place] Neuron Max sheath in the mid right  vertebral artery. A control arteriogram performed through the Neuron Max sheath demonstrated excellent distal antegrade flow without stasis or spasms or dissections. Over a 0.035 inch Roadrunner guidewire, a 117 5 [place] Catalyst guide catheter was then advanced to the distal right vertebral artery at the level of C1. The guidewire was removed. Good aspiration obtained from the hub of the Catalyst guide catheter. Control arteriogram performed through the Catalyst guide catheter demonstrated no change in the occluded mid basilar artery. Over a 0.014 inch standard Synchro micro guidewire with a moderate J configuration, a 162 cm 021 microcatheter was then advanced in a coaxial manner and with constant heparinized saline infusion to the distal end of the Catalyst guide catheter. With the micro guidewire leading with a J-tip configuration, and a torque device, the occluded portion of the mid basilar artery was then accessed with the micro guidewire to the distal basilar artery followed by the microcatheter. The guidewire was removed. Good aspiration obtained from the hub of the microcatheter. Gentle control arteriogram performed through the microcatheter demonstrated safe positioning of the tip of the microcatheter which was positioned at the origin of the right posterior cerebral artery. A 4 mm x 40 mm Solitaire X retrieval device was then advanced to the distal end of the microcatheter. With slight forward gentle traction with the right hand on the delivery micro guidewire, with the left hand the microcatheter was retrieved unsheathing the retrieval device. The Catalyst guide catheter was then advanced so that it was just inside the proximally occluded portion of the basilar artery. With proximal aspiration applied at the hub of the Catalyst guide catheter with a Penumbra aspiration device, for approximately 3 1/2 minutes, the combination of the retrieval device, and of the Catalyst guide catheter were retrieved and  removed. Chunks of clot was seen entangled in the retrieval device and also in the canister. The aspiration was obtained at the hub of the Neuron Max sheath. A control arteriogram performed through the Neuron Max sheath and the distal right vertebral artery now demonstrated complete revascularization of the basilar artery, the right posterior cerebral arteries, the superior cerebellar arteries and the anterior-inferior cerebellar arteries. 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 This is most likely related to intracranial arteriosclerosis.  It was elected to proceed with placement of a rescue stent to maintain revascularization achieved. Over a 0.014 inch standard Synchro micro guidewire with an 021 162 microcatheter inside of a 5 [place] Catalyst guide catheter combination was advanced without difficulty to the proximal basilar artery. Using a torque device, the micro guidewire was advanced without difficulty through the severely stenotic mid basilar artery into the right posterior cerebral artery followed by the microcatheter. The guidewire was removed. Good aspiration obtained from the hub of the microcatheter in the right posterior cerebral artery. Gentle control arteriogram performed through the microcatheter demonstrated safe positioning of the tip of the microcatheter. This in turn was then exchanged for an 014 inch 300 cm Zoom exchange micro guidewire under constant fluoroscopic guidance. A control arteriogram performed through the Catalyst guide catheter in the distal right vertebrobasilar junction demonstrates safe positioning of the distal micro guidewire, with worsening restenosis of the mid basilar artery. A CT of the brain was performed which demonstrated no evidence of intracranial hemorrhage. Prior to this the patient was loaded with aspirin 81 mg, and Brilinta 180 mg via an orogastric tube. Measurements  were performed of the basilar artery distal to the severe stenosis, and proximal to it. It was decided to proceed with placement of a 2.25 mm x 15 mm Resolute Onyx balloon mountable stent. This was prepped and purged retrogradely with heparinized saline infusion, and antegradely with 75% contrast and 25% heparinized saline infusion. Using the rapid exchange technique, the stent delivery catheter combination was advanced without difficulty to the distal end of the Catalyst guide catheter and positioned with the distal and proximal markers adequate distance from the site of the reocclusion. Control inflation was then performed using micro inflation syringe device via micro tubing to approximately 2.25 mm where it was maintained for approximately 15 seconds. Balloon was deflated and retrieved and removed. Control arteriogram performed through the Catalyst guide catheter in the right vertebral artery demonstrated excellent apposition with flow into the posterior circulation achieving a TICI 3 revascularization. 50 mg of cangrelor was given intravenously followed by 4 hour low dose infusion. Patient was also given intra-arterially 2 mg of Integrilin to ensure no acute aggregation of platelets within the stent. None was observed. Control arteriograms were then performed at 15 and 30 minutes post deployment of the stent. This continued to demonstrate patency of the stent and of the posterior fossa branches. The 5 [place] Catalyst guide catheter, and Neuron Max sheath were then retrieved and removed. Final control arteriogram performed through the Neuron Max sheath just proximal to the origin of the right vertebral artery demonstrated patency of the right vertebral artery to the cranial skull base and also of the basilar artery. The Neuron Max sheath was removed. The 8 [place] Pinnacle sheath was removed with successful hemostasis at the right groin puncture site with a 7 [place] ExoSeal closure device with quick clot. Distal  pulses remained Dopplerable in the right dorsal pedis and the posterior tibial, and the left posterior tibial, without a Dopplerable left dorsalis pedis as per prior to the procedure. The patient was left intubated to protect his airway. Patient was then transferred to the neuro ICU for post revascularization management. Immediate post revascularization CT of the brain demonstrated no intracranial hemorrhage or hydrocephalus. IMPRESSION: Status post endovascular complete revascularization of occluded mid basilar artery with 1 pass with a 4 mm x 40 mm Solitaire X retrieval device, and contact aspiration achieving a TICI 3 revascularization. Endovascular revascularization of a reocclusion related  to a tight atherosclerotic plaque in the mid basilar artery using 2.25 mm x 15 mm Resolute Onyx balloon mountable stent achieving a TICI 3 revascularization. PLAN: Follow-up in the clinic 2 weeks post discharge. Address severe high-grade right internal carotid artery proximal stenosis. Electronically Signed   By: Luanne Bras M.D.   On: 03/20/2021 10:25   IR ANGIO VERTEBRAL SEL VERTEBRAL UNI L MOD SED  Result Date: 03/20/2021 INDICATION: Acute onset of mental status changes, speech difficulties and right-sided weakness. Occluded basilar artery on CT angiogram of the head and neck. EXAM: 1. EMERGENT LARGE VESSEL OCCLUSION THROMBOLYSIS (POSTERIOR CIRCULATION) COMPARISON:  CT angiogram of the head and neck of March 16, 2021. MEDICATIONS: Ancef 2 g IV antibiotic was administered within 1 hour of the procedure. ANESTHESIA/SEDATION: General anesthesia. CONTRAST:  Omnipaque 300 approximately 120 mL. FLUOROSCOPY TIME:  Fluoroscopy Time: 54 minutes 6 seconds (2964 mGy). COMPLICATIONS: None immediate. TECHNIQUE: Following a full explanation of the procedure along with the potential associated complications, an informed witnessed consent was obtained. The risks of intracranial hemorrhage of 10%, worsening neurological  deficit, ventilator dependency, death and inability to revascularize were all reviewed in detail with the patient. The patient was then put under general anesthesia by the Department of Anesthesiology at [facility]. The right groin was prepped and draped in the usual sterile fashion. Thereafter using modified Seldinger technique, transfemoral access into the right common femoral artery was obtained without difficulty. Over a 0.035 inch guidewire an 8 [place] Pinnacle 25 cm sheath was inserted. Through this, and also over a 0.035 inch guidewire a 5 [place] JB 1 catheter was advanced to the aortic arch region and selectively positioned in the left vertebral artery, innominate artery and the right common carotid artery. FINDINGS: The left vertebral artery origin is widely patent. The vessel is seen to opacify to the cranial skull base. Patency is maintained of the left vertebrobasilar junction with significant tapered stenosis of the distal left vertebral artery just proximal to the confluence to form the proximal basilar artery. Opacification is seen of the left anterior inferior cerebellar artery/left posterior-inferior cerebral artery complex. Complete angiographic occlusion is seen of the basilar artery just distal to the origin of the anterior-inferior cerebellar arteries bilaterally. Retrograde opacification of the right vertebrobasilar junction is seen. Innominate arteriogram demonstrates the right subclavian artery proximally and the right common carotid proximally to be widely patent. The right common carotid arteriogram demonstrates the right external carotid artery and its major branches to be widely patent. There is severe stenosis of the right internal carotid artery at the bulb of approximately 80% associated with an ulcerated plaque at the bulb. More distally, the right internal carotid artery is seen to opacify to the cranial skull base. Opacified portions of the petrous, the cavernous and the  supraclinoid segment are widely patent. A right vertebral artery arteriogram demonstrates the origin of the right vertebral artery to be widely patent. More distally, the right vertebral artery opacifies to the cranial skull base with opacification of the right vertebrobasilar junction and the right posterior meningeal artery. Suggestion of an occluded right posteroinferior cerebellar artery is seen with more distal reconstitution via leptomeningeal collaterals arising from the right anterior inferior cerebellar artery/posteroinferior cerebellar artery complex. Again demonstrated is the complete occlusion of the basilar artery just distal to the right anterior inferior cerebellar artery/posteroinferior cerebellar artery complex. PROCEDURE: The diagnostic JB 1 catheter in the distal right subclavian artery was then exchanged over a 035 inch 300 cm Rosen exchange guidewire  for an 8 [place] 80 cm Neuron Max sheath. The guidewire was removed. Good aspiration obtained from the hub of the Neuron Max sheath. This was then gently retrieved just proximal to the origin of the right vertebral artery. Over an 035 inch Roadrunner guidewire, a 5 [place] JB 1 catheter was then advanced coaxially through the Neuron Max sheath into the mid right vertebral artery. The Neuron Max sheath was then gently advanced over the support JB 1 catheter. The guidewire and the support catheter were removed. Good aspiration obtained from the hub of the 8 [place] Neuron Max sheath in the mid right vertebral artery. A control arteriogram performed through the Neuron Max sheath demonstrated excellent distal antegrade flow without stasis or spasms or dissections. Over a 0.035 inch Roadrunner guidewire, a 117 5 [place] Catalyst guide catheter was then advanced to the distal right vertebral artery at the level of C1. The guidewire was removed. Good aspiration obtained from the hub of the Catalyst guide catheter. Control arteriogram performed through the  Catalyst guide catheter demonstrated no change in the occluded mid basilar artery. Over a 0.014 inch standard Synchro micro guidewire with a moderate J configuration, a 162 cm 021 microcatheter was then advanced in a coaxial manner and with constant heparinized saline infusion to the distal end of the Catalyst guide catheter. With the micro guidewire leading with a J-tip configuration, and a torque device, the occluded portion of the mid basilar artery was then accessed with the micro guidewire to the distal basilar artery followed by the microcatheter. The guidewire was removed. Good aspiration obtained from the hub of the microcatheter. Gentle control arteriogram performed through the microcatheter demonstrated safe positioning of the tip of the microcatheter which was positioned at the origin of the right posterior cerebral artery. A 4 mm x 40 mm Solitaire X retrieval device was then advanced to the distal end of the microcatheter. With slight forward gentle traction with the right hand on the delivery micro guidewire, with the left hand the microcatheter was retrieved unsheathing the retrieval device. The Catalyst guide catheter was then advanced so that it was just inside the proximally occluded portion of the basilar artery. With proximal aspiration applied at the hub of the Catalyst guide catheter with a Penumbra aspiration device, for approximately 3 1/2 minutes, the combination of the retrieval device, and of the Catalyst guide catheter were retrieved and removed. Chunks of clot was seen entangled in the retrieval device and also in the canister. The aspiration was obtained at the hub of the Neuron Max sheath. A control arteriogram performed through the Neuron Max sheath and the distal right vertebral artery now demonstrated complete revascularization of the basilar artery, the right posterior cerebral arteries, the superior cerebellar arteries and the anterior-inferior cerebellar arteries. 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 This is most likely related to intracranial arteriosclerosis. It was elected to proceed with placement of a rescue stent to maintain revascularization achieved. Over a 0.014 inch standard Synchro micro guidewire with an 021 162 microcatheter inside of a 5 [place] Catalyst guide catheter combination was advanced without difficulty to the proximal basilar artery. Using a torque device, the micro guidewire was advanced without difficulty through the severely stenotic mid basilar artery into the right posterior cerebral artery followed by the microcatheter. The guidewire was removed. Good aspiration obtained from the hub of the microcatheter in the right posterior cerebral artery. Gentle control  arteriogram performed through the microcatheter demonstrated safe positioning of the tip of the microcatheter. This in turn was then exchanged for an 014 inch 300 cm Zoom exchange micro guidewire under constant fluoroscopic guidance. A control arteriogram performed through the Catalyst guide catheter in the distal right vertebrobasilar junction demonstrates safe positioning of the distal micro guidewire, with worsening restenosis of the mid basilar artery. A CT of the brain was performed which demonstrated no evidence of intracranial hemorrhage. Prior to this the patient was loaded with aspirin 81 mg, and Brilinta 180 mg via an orogastric tube. Measurements were performed of the basilar artery distal to the severe stenosis, and proximal to it. It was decided to proceed with placement of a 2.25 mm x 15 mm Resolute Onyx balloon mountable stent. This was prepped and purged retrogradely with heparinized saline infusion, and antegradely with 75% contrast and 25% heparinized saline infusion. Using the rapid exchange technique, the stent delivery catheter combination was advanced without difficulty to the distal  end of the Catalyst guide catheter and positioned with the distal and proximal markers adequate distance from the site of the reocclusion. Control inflation was then performed using micro inflation syringe device via micro tubing to approximately 2.25 mm where it was maintained for approximately 15 seconds. Balloon was deflated and retrieved and removed. Control arteriogram performed through the Catalyst guide catheter in the right vertebral artery demonstrated excellent apposition with flow into the posterior circulation achieving a TICI 3 revascularization. 50 mg of cangrelor was given intravenously followed by 4 hour low dose infusion. Patient was also given intra-arterially 2 mg of Integrilin to ensure no acute aggregation of platelets within the stent. None was observed. Control arteriograms were then performed at 15 and 30 minutes post deployment of the stent. This continued to demonstrate patency of the stent and of the posterior fossa branches. The 5 [place] Catalyst guide catheter, and Neuron Max sheath were then retrieved and removed. Final control arteriogram performed through the Neuron Max sheath just proximal to the origin of the right vertebral artery demonstrated patency of the right vertebral artery to the cranial skull base and also of the basilar artery. The Neuron Max sheath was removed. The 8 [place] Pinnacle sheath was removed with successful hemostasis at the right groin puncture site with a 7 [place] ExoSeal closure device with quick clot. Distal pulses remained Dopplerable in the right dorsal pedis and the posterior tibial, and the left posterior tibial, without a Dopplerable left dorsalis pedis as per prior to the procedure. The patient was left intubated to protect his airway. Patient was then transferred to the neuro ICU for post revascularization management. Immediate post revascularization CT of the brain demonstrated no intracranial hemorrhage or hydrocephalus. IMPRESSION: Status post  endovascular complete revascularization of occluded mid basilar artery with 1 pass with a 4 mm x 40 mm Solitaire X retrieval device, and contact aspiration achieving a TICI 3 revascularization. Endovascular revascularization of a reocclusion related to a tight atherosclerotic plaque in the mid basilar artery using 2.25 mm x 15 mm Resolute Onyx balloon mountable stent achieving a TICI 3 revascularization. PLAN: Follow-up in the clinic 2 weeks post discharge. Address severe high-grade right internal carotid artery proximal stenosis. Electronically Signed   By: Luanne Bras M.D.   On: 03/20/2021 10:25      HISTORY OF PRESENT ILLNESS   Andre [name] is a 70 y.o. male with PMH significant for hypertension, prior stroke with residual mild right-sided weakness and requires a cane to walk,  history of significant alcohol intake, does not see a PCP who presents with unresponsive.  History obtained from patient's friend who lives with him and from the ED notes. Per report patient was last seen leaving home at 1800. Alisia returned home around 2000 to found patient on the porch, altered and weak on the right. Felt that he drank too much and so was sent to the ED.   He woke up more in the ED and upon awakening was noted to have worsening R sided weakness along with Left gaze preference with dysarthric speech and speech apraxia. Blood glucose was 96 on arrival and serum EtOH level was 16.   He is able to tell me some history although limited by speech apraxia. Reports that he is much weaker on the Right than he usually is, sensation is decreased in RUE and RLE. He has a left facial droop with air leaking on the left when he blows up his cheeks.   CTH on arrival was negative for an acute ICH. He is not on [facility]. CT Angio head and neck with mid basilar occlusion with distal reconstitution.   His RUE and RLE weakness did improve on repeat exam but with significant sensory deficit,dysarthric speech with speech  apraxia and persistent gaze deviation.     mRS: 0 TNKase: outside window Thrombectomy: Will get him to IR for cerebral angiogram and potential thrombectomy under general anesthesia.  Patient woke up more in the ED with improvement in mentation and improved strength in RUE and RLE but persistent and significantly disabling speech apraxia with dysarthric speech and R gaze deviation and unable to corss midline.   Dr. Estanislado Pandy discussed the risks and benefits of angiogram and potential thrombectomy and stenting with patient over the phone and I witnessed the entirety of conversation. Discussed risks of possible bleed that can worsen his symptoms and even lead to death. Discussed that alternative is waiting to see if his symptoms improve but there is a chance that he can completely occlude his basialr artery and can lead to strokes in the back of his brain which can also lead to death.            NIHSS components Score: Comment  1a Level of Conscious 0[x]  1[]  2[]  3[]         1b LOC Questions 0[x]  1[]  2[]           1c LOC Commands 0[x]  1[]  2[]           2 Best Gaze 0[]  1[]  2[x]           3 Visual 0[x]  1[]  2[]  3[]         4 Facial Palsy 0[]  1[x]  2[]  3[]         5a Motor Arm - left 0[x]  1[]  2[]  3[]  4[]  UN[]     5b Motor Arm - Right 0[]  1[x]  2[]  3[]  4[]  UN[]     6a Motor Leg - Left 0[x]  1[]  2[]  3[]  4[]  UN[]     6b Motor Leg - Right 0[]  1[]  2[x]  3[]  4[]  UN[]     7 Limb Ataxia 0[x]  1[]  2[]  3[]  UN[]       8 Sensory 0[]  1[x]  2[]  UN[]         9 Best Language 0[x]  1[]  2[]  3[]         10 Dysarthria 0[]  1[]  2[x]  UN[]         11 Extinct. and Inattention 0[x]  1[]  2[]           TOTAL: [place]  COURSE Mr. MATAS BURROWS is a 70 y.o. male with history of hypertension, alcohol abuse, smoker, substance abuse, history of stroke, admitted for unresponsiveness, right-sided weakness, left gaze, slurred speech and left facial droop after alcohol abuse.  CT no acute abnormality, CT head and neck mid basilar artery  occlusion.  Status post IR with TICI3 and basilar artery stenting.  Stroke: b/l pontine and right PCA scattered infarct secondary to progressive large vessel disease with persistent cocaine abuse, smoker and alcohol abuse Code Stroke CT head No acute abnormality.    CTA head & neck Acute occlusion of mid basilar artery, atheromatous disease in V4 segments with multfocal narrowing, left worse than right, 80% atheromatous stenoses at carotid bifurcations, moderate stenosis of supraclinoid right ICA, severe stenosis of supraclinoid left ICA, moderate distal left M1 stenosis Status post IR with TICI3 and basilar artery stenting.  Post IR CT acute infarct in brainstem and right occipital cortex MRI small volume acute ischemic infarcts in right cerebellum, pons, right occipital lobe and right thalamus with underlying chronic microvascular ischemic disease MRA Patent flow through basilar artery stent and moderate stenosis of supraclinoid left ICA 2D Echo EF 50-55% LDL 54 HgbA1c 4.6 UDS positive for cocaine VTE prophylaxis - Lovenox aspirin 81 mg daily and clopidogrel 75 mg daily prior to admission, now on aspirin 81 mg daily and Brilinta (ticagrelor) 90 mg bid given stenting Therapy recommendations:  SNF Disposition:  SNF today   History of stroke 05/2015, MRI showed right pontine infarct, remote right BG and cerebellum infarct.  CTA head and neck showed right ICA 60% stenosis, left ICA 50% stenosis, bilateral ICA siphon, VA and BA stenosis.  EF 50 to 55%, LDL 52, UDS positive for cocaine.  Discharged with DAPT and Zocor 04/2017 admitted for right-sided weakness, slurred speech and right facial droop.  MRI showed left pontine infarct.  CT head and neck bilateral ICA bulb 30 to 40% stenosis.  Severe left siphon stenosis, basilar artery stenosis.  EF 60 to 65%.  A1c 4.6, LDL 45, UDS positive for cocaine.  Discharged with DAPT and Lipitor 80.   Progressive intracranial vascular stenosis 05/2015, CT head  and neck showed bilateral VAs and BA stenosis, however BA patent 04/2017 CTA head and neck showed similar bilateral VAs and BA stenosis but patent Current admission CTA head and neck showed progressive worsening bilateral V4 stenosis and atherosclerosis.  BA occlusion.   Hypertension Home meds:  none BP goal < 180/105 now Stable off Cleviprex On amlodipine 10 Long-term BP goal normotensive   Hyperlipidemia Home meds:  atorvastatin 80 mg daily, resumed in hospital LDL 54, goal < 70 High intensity statin continued Continue statin at discharge   Cocaine abuse UDS positive for cocaine, patient also used cocaine prior to previous strokes. Social work consult for substance abuse cessation assistance Cessation education will be provided   Tobacco abuse Current smoker Smoking cessation counseling will be provided   Alcohol abuse On CIWA protocol [id number]/MVI/B1 Education on alcohol Limited use will be provided ETOH use, alcohol level 16, will advise to drink no more than 2 drink(s) a day   Other Stroke Risk Factors Advanced Age >/= 30    Other Active Problems      DISCHARGE EXAM Blood pressure 97/65, pulse 83, temperature 98.5 F (36.9 C), temperature source Oral, resp. rate 14, height 6' (1.829 m), weight 72.6 kg, SpO2 97 %.  General - Well nourished, well developed, in no apparent distress.   Ophthalmologic - fundi not visualized  due to noncooperation.   Cardiovascular - Regular rhythm and rate.   Neuro - awake, alert, eyes open, orientated to age, place, time. No aphasia, however moderate to severe dysarthria, following all simple commands. No gaze palsy, tracking bilaterally, visual field full, PERRL, no nystagmus or disconjugate eyes. Mild right facial droop. Tongue midline. LUE and LLE at least 4/5, RUE 3-/5 proximal and distally. RLE 3/5 proximal and 3+/5 distally. Sensation decreased on the left, left FTN ataxia and HTS dysmetria. R HTS dysmetria but seems not out of  proportion to the weakness, gait not tested.     Discharge Diet       Diet   Diet regular Room service appropriate? Yes; Fluid consistency: Thin   liquids  DISCHARGE PLAN Disposition:   snf aspirin 81 mg daily and Brilinta (ticagrelor) 90 mg bid for secondary stroke prevention   Ongoing stroke risk factor control by Primary Care Physician at time of discharge Follow-up PCP Maudie Mercury, MD in 2 weeks. Follow-up in [facility] in 4 weeks, office to schedule an appointment.    35 minutes were spent preparing discharge.  Rosalin Hawking, MD PhD Stroke Neurology 03/20/2021 1:27 PM

## 2021-03-20 NOTE — Progress Notes (Signed)
Handoff report given to receiving facility RN, discharge packet to go with [facility], awaiting transport.

## 2021-03-20 NOTE — Progress Notes (Addendum)
STROKE TEAM PROGRESS NOTE    INTERVAL HISTORY   Neuro unchanged. Passed swallow on diet.  Pt seen sitting up in chair. Has just ambulated with PT. Pt reports less dizziness. But has still righgt sided weakness and ataxia.   Vitals:   03/19/21 2100 03/20/21 0047 03/20/21 0500 03/20/21 0822  BP: 127/70 121/69 128/69 116/73  Pulse: 90 83 89 83  Resp: 19 16 20 14   Temp: 98.1 F (36.7 C) 98.9 F (37.2 C) 98.9 F (37.2 C) 99 F (37.2 C)  TempSrc: Oral Oral Oral Oral  SpO2: 99% 97% 98% 98%  Weight:      Height:       CBC:  Recent Labs  Lab 03/17/21 0333 03/18/21 0248  WBC 8.4 7.0  NEUTROABS 6.1 5.0  HGB 12.1* 12.0*  HCT 38.9* 37.8*  MCV 95.3 94.5  PLT 171 097    Basic Metabolic Panel:  Recent Labs  Lab 03/16/21 1229 03/17/21 0333 03/18/21 0248  NA  --  139 136  K  --  4.0 4.0  CL  --  107 104  CO2  --  26 24  GLUCOSE  --  90 92  BUN  --  9 13  CREATININE  --  1.16 1.13  CALCIUM  --  8.5* 8.6*  MG 1.9  --   --     Lipid Panel:  Recent Labs  Lab 03/16/21 0453  CHOL 135  TRIG 49  HDL 71  CHOLHDL 1.9  VLDL 10  LDLCALC 54    HgbA1c:  Recent Labs  Lab 03/16/21 0453  HGBA1C 4.6*    Urine Drug Screen:  Recent Labs  Lab 03/15/21 2133  LABOPIA NONE DETECTED  COCAINSCRNUR POSITIVE*  LABBENZ NONE DETECTED  AMPHETMU NONE DETECTED  THCU NONE DETECTED  LABBARB NONE DETECTED     Alcohol Level  Recent Labs  Lab 03/15/21 2145  ETH 16*     IMAGING past 24 hours DG Abd 1 View  Result Date: 03/16/2021 CLINICAL DATA:  Altered mental status. Metal foreign body in abdomen. EXAM: ABDOMEN - 1 VIEW COMPARISON:  None. FINDINGS: Surgical clips in the right upper quadrant. There is no other metallic foreign body or evidence of implanted medical device in the abdomen. Mild gaseous distention of bowel loops without evidence of obstruction. Included lung bases are clear. Multilevel degenerative change in the spine. No acute osseous abnormalities are seen.  IMPRESSION: Surgical clips in the right upper quadrant from cholecystectomy. No other metallic foreign body or evidence of implanted medical device in the abdomen. Electronically Signed   By: Keith Rake M.D.   On: 03/16/2021 18:38   CT HEAD WO CONTRAST (5MM)  Result Date: 03/16/2021 CLINICAL DATA:  Stroke follow-up.  Basilar stent placement. EXAM: CT HEAD WITHOUT CONTRAST TECHNIQUE: Contiguous axial images were obtained from the base of the skull through the vertex without intravenous contrast. COMPARISON:  CT and CTA from earlier the same day FINDINGS: Brain: Acute infarct is likely present in the left paramedian brainstem and to a small degree in the right occipital cortex. Remote left inferior cerebellar infarction. Chronic lacunar infarcts. No hemorrhage, hydrocephalus, or collection. Vascular: Atheromatous calcification and mid basilar stent. Skull: Negative Sinuses/Orbits: Negative IMPRESSION: Acute infarct likely in the brainstem and right occipital cortex. No acute hemorrhage. Electronically Signed   By: Jorje Guild M.D.   On: 03/16/2021 06:58   CT HEAD WO CONTRAST  Result Date: 03/15/2021 CLINICAL DATA:  Mental status change, neck trauma. EXAM: CT  HEAD WITHOUT CONTRAST CT CERVICAL SPINE WITHOUT CONTRAST TECHNIQUE: Multidetector CT imaging of the head and cervical spine was performed following the standard protocol without intravenous contrast. Multiplanar CT image reconstructions of the cervical spine were also generated. COMPARISON:  CT head and cervical spine 08/30/2017 FINDINGS: CT HEAD FINDINGS Brain: Chronic left cerebellar and left basal ganglia infarction. No evidence of large-territorial acute infarction. No parenchymal hemorrhage. No mass lesion. No extra-axial collection. No mass effect or midline shift. No hydrocephalus. Basilar cisterns are patent. Vascular: No hyperdense vessel. Atherosclerotic calcifications are present within the cavernous internal carotid arteries. Skull:  No acute fracture or focal lesion. Sinuses/Orbits: Paranasal sinuses and mastoid air cells are clear. The orbits are unremarkable. Other: None. CT CERVICAL SPINE FINDINGS Alignment: Stable grade 1 anterolisthesis of C2 on C3 and C7 on T1. Skull base and vertebrae: Multilevel severe degenerative changes of the spine. Associated multilevel severe neural foraminal stenosis. No severe osseous central canal stenosis. No acute fracture. No aggressive appearing focal osseous lesion or focal pathologic process. Soft tissues and spinal canal: No prevertebral fluid or swelling. No visible canal hematoma. Upper chest: Centrilobular emphysematous changes. Other: None. IMPRESSION: 1. No acute intracranial abnormality. 2. No acute displaced fracture or traumatic listhesis of the cervical spine in a patient with multilevel degenerative change of the spine leading to severe osseous neural foraminal stenosis. 3.  Emphysema (ICD10-J43.9). Electronically Signed   By: Iven Finn M.D.   On: 03/15/2021 22:35   CT CERVICAL SPINE WO CONTRAST  Result Date: 03/15/2021 CLINICAL DATA:  Mental status change, neck trauma. EXAM: CT HEAD WITHOUT CONTRAST CT CERVICAL SPINE WITHOUT CONTRAST TECHNIQUE: Multidetector CT imaging of the head and cervical spine was performed following the standard protocol without intravenous contrast. Multiplanar CT image reconstructions of the cervical spine were also generated. COMPARISON:  CT head and cervical spine 08/30/2017 FINDINGS: CT HEAD FINDINGS Brain: Chronic left cerebellar and left basal ganglia infarction. No evidence of large-territorial acute infarction. No parenchymal hemorrhage. No mass lesion. No extra-axial collection. No mass effect or midline shift. No hydrocephalus. Basilar cisterns are patent. Vascular: No hyperdense vessel. Atherosclerotic calcifications are present within the cavernous internal carotid arteries. Skull: No acute fracture or focal lesion. Sinuses/Orbits: Paranasal  sinuses and mastoid air cells are clear. The orbits are unremarkable. Other: None. CT CERVICAL SPINE FINDINGS Alignment: Stable grade 1 anterolisthesis of C2 on C3 and C7 on T1. Skull base and vertebrae: Multilevel severe degenerative changes of the spine. Associated multilevel severe neural foraminal stenosis. No severe osseous central canal stenosis. No acute fracture. No aggressive appearing focal osseous lesion or focal pathologic process. Soft tissues and spinal canal: No prevertebral fluid or swelling. No visible canal hematoma. Upper chest: Centrilobular emphysematous changes. Other: None. IMPRESSION: 1. No acute intracranial abnormality. 2. No acute displaced fracture or traumatic listhesis of the cervical spine in a patient with multilevel degenerative change of the spine leading to severe osseous neural foraminal stenosis. 3.  Emphysema (ICD10-J43.9). Electronically Signed   By: Iven Finn M.D.   On: 03/15/2021 22:35   MR ANGIO HEAD WO CONTRAST  Result Date: 03/17/2021 CLINICAL DATA:  Follow-up examination for acute stroke. EXAM: MRI HEAD WITHOUT CONTRAST MRA HEAD WITHOUT CONTRAST TECHNIQUE: Multiplanar, multi-echo pulse sequences of the brain and surrounding structures were acquired without intravenous contrast. Angiographic images of the Circle of Willis were acquired using MRA technique without intravenous contrast. COMPARISON:  Prior studies from 03/16/2021. FINDINGS: MRI HEAD FINDINGS Brain: Moderately advanced cerebral atrophy. Patchy T2/FLAIR hyperintensity within the  periventricular and deep white matter both cerebral hemispheres most consistent with chronic small vessel ischemic disease, moderate in nature. Multiple scattered remote lacunar infarcts present about the hemispheric cerebral white matter, basal ganglia, and thalami. Chronic left greater than right bilateral cerebellar infarcts. Few scattered chronic micro hemorrhages noted about the thalami, likely related to chronic poorly  controlled hypertension. Patchy small volume acute ischemic infarcts seen involving the peripheral right cerebellum (series 5, image 54), pons (series 5, image 61), right occipital lobe (series 5, images 65, 70, 75), and right thalamus (series 5, image 73). No associated hemorrhage or mass effect. No other evidence for acute or subacute ischemia. No acute intracranial hemorrhage. No mass lesion, mass effect or midline shift. No hydrocephalus or extra-axial fluid collection. Pituitary gland suprasellar region within normal limits. Midline structures intact. Vascular: Major intracranial vascular flow voids are maintained. Skull and upper cervical spine: Craniocervical junction within normal limits. Bone marrow signal intensity somewhat heterogeneous without focal marrow replacing lesion. No scalp soft tissue abnormality. Sinuses/Orbits: Globes and orbital soft tissues within normal limits. Scattered mucosal thickening noted within the ethmoidal air cells. Paranasal sinuses are otherwise clear. Trace right mastoid effusion noted, of doubtful significance. Inner ear structures grossly normal. Other: Chronic cystic lesion involving the subcutaneous fat of the lower left face again noted, of doubtful significance. MRA HEAD FINDINGS Anterior circulation: Examination mildly degraded by motion. Visualized distal cervical segments of the internal carotid arteries are patent with antegrade flow. Petrous and cavernous segments patent bilaterally. Moderate stenosis at the supraclinoid left ICA (series 1047, image 19). No significant narrowing about the contralateral supraclinoid right ICA by MRA. Right A1 patent. Left A1 hypoplastic and/or absent, accounting for the diminutive left ICA is compared to the right. Normal anterior communicating artery complex. Anterior cerebral arteries patent without stenosis. M1 segments patent without definite stenosis. Irregularity at the mid left M1 on MIP reconstructions felt to be consistent  with motion artifact. No proximal MCA branch occlusion. Distal MCA branches perfused and symmetric. Posterior circulation: Atheromatous irregularity within the V4 segments bilaterally without hemodynamically significant stenosis by MRA. Right vertebral artery slightly dominant. Right PICA patent at its origin. Left PICA not seen. Interval placement of a vascular stent within the basilar artery, extending from the level of the AICAs distally. Grossly patent flow through the stent, although this is not well assessed by MRA. AICA remain perfused. Patent flow seen distally within the basilar artery. Superior cerebral arteries patent bilaterally. Right PCA supplied via the basilar. Fetal type origin left PCA. PCAs remain patent to their distal aspects without high-grade stenosis. Anatomic variants: Fetal type origin of the left PCA. Hypoplastic/absent left A1. IMPRESSION: MRI HEAD IMPRESSION: 1. Patchy small volume acute ischemic infarcts involving the right cerebellum, pons, right occipital lobe, and right thalamus as above. No associated hemorrhage or mass effect. 2. Underlying chronic microvascular ischemic disease with multiple remote lacunar infarcts about the hemispheric cerebral white matter, deep gray nuclei, and cerebellum. MRA HEAD IMPRESSION: 1. Interval stenting of the basilar artery. Grossly patent flow through the stent, with good perfusion distally. 2. Moderate stenosis at the supraclinoid left ICA. 3. Additional mild intracranial atherosclerotic disease. No other hemodynamically significant stenosis by MRA. Electronically Signed   By: Jeannine Boga M.D.   On: 03/17/2021 04:56   MR BRAIN WO CONTRAST  Result Date: 03/17/2021 CLINICAL DATA:  Follow-up examination for acute stroke. EXAM: MRI HEAD WITHOUT CONTRAST MRA HEAD WITHOUT CONTRAST TECHNIQUE: Multiplanar, multi-echo pulse sequences of the brain and surrounding structures were  acquired without intravenous contrast. Angiographic images of the  Circle of Willis were acquired using MRA technique without intravenous contrast. COMPARISON:  Prior studies from 03/16/2021. FINDINGS: MRI HEAD FINDINGS Brain: Moderately advanced cerebral atrophy. Patchy T2/FLAIR hyperintensity within the periventricular and deep white matter both cerebral hemispheres most consistent with chronic small vessel ischemic disease, moderate in nature. Multiple scattered remote lacunar infarcts present about the hemispheric cerebral white matter, basal ganglia, and thalami. Chronic left greater than right bilateral cerebellar infarcts. Few scattered chronic micro hemorrhages noted about the thalami, likely related to chronic poorly controlled hypertension. Patchy small volume acute ischemic infarcts seen involving the peripheral right cerebellum (series 5, image 54), pons (series 5, image 61), right occipital lobe (series 5, images 65, 70, 75), and right thalamus (series 5, image 73). No associated hemorrhage or mass effect. No other evidence for acute or subacute ischemia. No acute intracranial hemorrhage. No mass lesion, mass effect or midline shift. No hydrocephalus or extra-axial fluid collection. Pituitary gland suprasellar region within normal limits. Midline structures intact. Vascular: Major intracranial vascular flow voids are maintained. Skull and upper cervical spine: Craniocervical junction within normal limits. Bone marrow signal intensity somewhat heterogeneous without focal marrow replacing lesion. No scalp soft tissue abnormality. Sinuses/Orbits: Globes and orbital soft tissues within normal limits. Scattered mucosal thickening noted within the ethmoidal air cells. Paranasal sinuses are otherwise clear. Trace right mastoid effusion noted, of doubtful significance. Inner ear structures grossly normal. Other: Chronic cystic lesion involving the subcutaneous fat of the lower left face again noted, of doubtful significance. MRA HEAD FINDINGS Anterior circulation: Examination  mildly degraded by motion. Visualized distal cervical segments of the internal carotid arteries are patent with antegrade flow. Petrous and cavernous segments patent bilaterally. Moderate stenosis at the supraclinoid left ICA (series 1047, image 19). No significant narrowing about the contralateral supraclinoid right ICA by MRA. Right A1 patent. Left A1 hypoplastic and/or absent, accounting for the diminutive left ICA is compared to the right. Normal anterior communicating artery complex. Anterior cerebral arteries patent without stenosis. M1 segments patent without definite stenosis. Irregularity at the mid left M1 on MIP reconstructions felt to be consistent with motion artifact. No proximal MCA branch occlusion. Distal MCA branches perfused and symmetric. Posterior circulation: Atheromatous irregularity within the V4 segments bilaterally without hemodynamically significant stenosis by MRA. Right vertebral artery slightly dominant. Right PICA patent at its origin. Left PICA not seen. Interval placement of a vascular stent within the basilar artery, extending from the level of the AICAs distally. Grossly patent flow through the stent, although this is not well assessed by MRA. AICA remain perfused. Patent flow seen distally within the basilar artery. Superior cerebral arteries patent bilaterally. Right PCA supplied via the basilar. Fetal type origin left PCA. PCAs remain patent to their distal aspects without high-grade stenosis. Anatomic variants: Fetal type origin of the left PCA. Hypoplastic/absent left A1. IMPRESSION: MRI HEAD IMPRESSION: 1. Patchy small volume acute ischemic infarcts involving the right cerebellum, pons, right occipital lobe, and right thalamus as above. No associated hemorrhage or mass effect. 2. Underlying chronic microvascular ischemic disease with multiple remote lacunar infarcts about the hemispheric cerebral white matter, deep gray nuclei, and cerebellum. MRA HEAD IMPRESSION: 1. Interval  stenting of the basilar artery. Grossly patent flow through the stent, with good perfusion distally. 2. Moderate stenosis at the supraclinoid left ICA. 3. Additional mild intracranial atherosclerotic disease. No other hemodynamically significant stenosis by MRA. Electronically Signed   By: Jeannine Boga M.D.   On: 03/17/2021  04:56   DG CHEST PORT 1 VIEW  Result Date: 03/17/2021 CLINICAL DATA:  70 year old male with history of respiratory failure. EXAM: PORTABLE CHEST 1 VIEW COMPARISON:  Chest x-ray 03/16/2021. FINDINGS: Previously noted endotracheal and nasogastric tubes have been removed. Lung volumes are normal. No consolidative airspace disease. No pleural effusions. No pneumothorax. No pulmonary nodule or mass noted. Pulmonary vasculature and the cardiomediastinal silhouette are within normal limits. Multiple old right-sided posterolateral rib fractures are again noted. IMPRESSION: 1.  No radiographic evidence of acute cardiopulmonary disease. Electronically Signed   By: Vinnie Langton M.D.   On: 03/17/2021 08:07   DG CHEST PORT 1 VIEW  Result Date: 03/16/2021 CLINICAL DATA:  Intubation an orogastric tube placement. EXAM: PORTABLE CHEST 1 VIEW COMPARISON:  02/12/2021. FINDINGS: The heart size and mediastinal contours are within normal limits. Both lungs are clear. Old healed rib fractures are noted bilaterally. An enteric tube courses over the left upper quadrant and out of the field of view. The side port projects over the anticipated region of the stomach. The endotracheal tube terminates 3.3 cm above the carina. IMPRESSION: 1. No acute cardiopulmonary process. 2. Medical devices as described above. Electronically Signed   By: Brett Fairy M.D.   On: 03/16/2021 04:29   DG Chest Port 1 View  Result Date: 03/15/2021 CLINICAL DATA:  Question aspiration. Found on ground at side of apartment. Gurgling respirations. EXAM: PORTABLE CHEST 1 VIEW COMPARISON:  Radiographs 10/29/2020 FINDINGS:  The cardiomediastinal contours are normal. The lungs are clear. Pulmonary vasculature is normal. No consolidation, pleural effusion, or pneumothorax. Right posterior rib fractures are remote, they were acute on prior exam. There also remote left rib fractures. No acute osseous abnormalities are seen. IMPRESSION: 1. No acute chest findings. No radiographic findings to suggest aspiration. 2. Remote bilateral rib fractures. Electronically Signed   By: Keith Rake M.D.   On: 03/15/2021 22:08   ECHOCARDIOGRAM COMPLETE  Result Date: 03/16/2021    ECHOCARDIOGRAM REPORT   Patient Name:   Andre [name] Date of Exam: 03/16/2021 Medical Rec #:  [id number]          Height:       72.0 in Accession #:    [id number]         Weight:       160.1 lb Date of Birth:  01-19-1951          BSA:          1.938 m Patient Age:    17 years           BP:           112/85 mmHg Patient Gender: M                  HR:           84 bpm. Exam Location:  Inpatient Procedure: 2D Echo, Cardiac Doppler and Color Doppler Indications:    Stroke  History:        Patient has prior history of Echocardiogram examinations. Risk                 Factors:Hypertension.  Sonographer:    Jyl Heinz Referring Phys: [id number] [place]  1. Left ventricular ejection fraction, by estimation, is 50 to 55%. The left ventricle has low normal function. The left ventricle has no regional wall motion abnormalities. There is mild left ventricular hypertrophy. Left ventricular diastolic parameters are indeterminate.  2. Right ventricular systolic function is normal. The right  ventricular size is normal. There is normal pulmonary artery systolic pressure. The estimated right ventricular systolic pressure is 34.1 mmHg.  3. The mitral valve is abnormal. Trivial mitral valve regurgitation.  4. The aortic valve is tricuspid. Aortic valve regurgitation is trivial. Aortic valve sclerosis/calcification is present, without any evidence of aortic  stenosis.  5. The inferior vena cava is normal in size with <50% respiratory variability, suggesting right atrial pressure of 8 mmHg. Comparison(s): No prior Echocardiogram. FINDINGS  Left Ventricle: Left ventricular ejection fraction, by estimation, is 50 to 55%. The left ventricle has low normal function. The left ventricle has no regional wall motion abnormalities. The left ventricular internal cavity size was normal in size. There is mild left ventricular hypertrophy. Left ventricular diastolic parameters are indeterminate. Right Ventricle: The right ventricular size is normal. No increase in right ventricular wall thickness. Right ventricular systolic function is normal. There is normal pulmonary artery systolic pressure. The tricuspid regurgitant velocity is 2.23 m/s, and  with an assumed right atrial pressure of 8 mmHg, the estimated right ventricular systolic pressure is 93.7 mmHg. Left Atrium: Left atrial size was normal in size. Right Atrium: Right atrial size was normal in size. Pericardium: There is no evidence of pericardial effusion. Mitral Valve: The mitral valve is abnormal. There is mild thickening of the mitral valve leaflet(s). Trivial mitral valve regurgitation. Tricuspid Valve: The tricuspid valve is grossly normal. Tricuspid valve regurgitation is mild. Aortic Valve: The aortic valve is tricuspid. There is mild aortic valve annular calcification. Aortic valve regurgitation is trivial. Aortic valve sclerosis/calcification is present, without any evidence of aortic stenosis. Aortic valve peak gradient measures 9.6 mmHg. Pulmonic Valve: The pulmonic valve was grossly normal. Pulmonic valve regurgitation is trivial. Aorta: The aortic root is normal in size and structure. Venous: The inferior vena cava is normal in size with less than 50% respiratory variability, suggesting right atrial pressure of 8 mmHg. IAS/Shunts: No atrial level shunt detected by color flow Doppler.  LEFT VENTRICLE PLAX 2D  LVIDd:         4.70 cm     Diastology LVIDs:         3.30 cm     LV e' medial:    6.74 cm/s LV PW:         1.10 cm     LV E/e' medial:  8.1 LV IVS:        1.10 cm     LV e' lateral:   6.09 cm/s LVOT diam:     2.60 cm     LV E/e' lateral: 9.0 LV SV:         89 LV SV Index:   46 LVOT Area:     5.31 cm  LV Volumes (MOD) LV vol d, MOD A2C: 97.4 ml LV vol d, MOD A4C: 86.4 ml LV vol s, MOD A2C: 46.6 ml LV vol s, MOD A4C: 41.6 ml LV SV MOD A2C:     50.8 ml LV SV MOD A4C:     86.4 ml LV SV MOD BP:      48.6 ml RIGHT VENTRICLE             IVC RV Basal diam:  3.30 cm     IVC diam: 2.00 cm RV Mid diam:    2.40 cm RV S prime:     12.00 cm/s TAPSE (M-mode): 2.1 cm LEFT ATRIUM           Index  RIGHT ATRIUM           Index LA diam:      3.10 cm 1.60 cm/m   RA Area:     10.40 cm LA Vol (A2C): 12.0 ml 6.19 ml/m   RA Volume:   19.80 ml  10.22 ml/m LA Vol (A4C): 53.5 ml 27.60 ml/m  AORTIC VALVE AV Area (Vmax): 3.23 cm AV Vmax:        155.00 cm/s AV Peak Grad:   9.6 mmHg LVOT Vmax:      94.20 cm/s LVOT Vmean:     71.400 cm/s LVOT VTI:       0.167 m  AORTA Ao Root diam: 3.20 cm MITRAL VALVE               TRICUSPID VALVE MV Area (PHT): 3.91 cm    TR Peak grad:   19.9 mmHg MV Decel Time: 194 msec    TR Vmax:        223.00 cm/s MV E velocity: 54.70 cm/s MV A velocity: 68.90 cm/s  SHUNTS MV E/A ratio:  0.79        Systemic VTI:  0.17 m                            Systemic Diam: 2.60 cm Rozann Lesches MD Electronically signed by Rozann Lesches MD Signature Date/Time: 03/16/2021/12:07:57 PM    Final    CT ANGIO HEAD CODE STROKE  Result Date: 03/16/2021 CLINICAL DATA:  Initial evaluation for acute stroke. EXAM: CT ANGIOGRAPHY HEAD AND NECK TECHNIQUE: Multidetector CT imaging of the head and neck was performed using the standard protocol during bolus administration of intravenous contrast. Multiplanar CT image reconstructions and MIPs were obtained to evaluate the vascular anatomy. Carotid stenosis measurements (when  applicable) are obtained utilizing NASCET criteria, using the distal internal carotid diameter as the denominator. CONTRAST:  12mL OMNIPAQUE IOHEXOL 350 MG/ML SOLN COMPARISON:  CT from 03/15/2021 as well as prior CTA from 05/04/2017. FINDINGS: CTA NECK FINDINGS Aortic arch: Partially visualized aortic arch in origin of the great vessels grossly normal in caliber. No visible stenosis about the origin of the great vessels. Right carotid system: Right CCA patent from its origin to the bifurcation without stenosis. Bulky calcified plaque at the right carotid bulb with associated stenosis of up to 80% by NASCET criteria (series 7, image 222). Right ICA patent distally without stenosis, dissection or occlusion. Left carotid system: Left CCA patent from its origin to the bifurcation without stenosis. Eccentric mixed plaque at the left carotid bulb with associated stenosis of up to 80% by NASCET criteria (series 7, image 229). Left ICA patent distally without stenosis dissection or occlusion. Vertebral arteries: Both vertebral arteries arise from the subclavian arteries. No visible proximal subclavian artery stenosis. Right vertebral artery slightly dominant. Vertebral arteries patent within the neck without stenosis dissection or occlusion. Skeleton: No discrete or worrisome osseous lesions. Moderate to advanced multilevel spondylosis noted throughout the cervical spine. Postsurgical changes noted at the mandible. Other neck: Multiple hypodense/cystic lesions noted within the subcutaneous fat of the upper face/neck, greater on the left. The largest of these seen on the left and measures 2.8 cm in size (series 5, image 97). Few small foci of gas seen within this lesion. Additional lesion seen slightly inferiorly (series 5, image 112). Focal calcifications seen within a 1.2 cm lesion on the right (series 5, image 115). Findings are most likely benign in reflect sebaceous  cysts, and are similar as compared to 2018. No other  acute soft tissue abnormality within the neck. Upper chest: Emphysema. Visualized upper chest demonstrates no other acute finding. Review of the MIP images confirms the above findings CTA HEAD FINDINGS Anterior circulation: Petrous segments patent bilaterally. Atheromatous plaque within the carotid siphons bilaterally with associated moderate stenosis at the supraclinoid right ICA (series 7, image 110). Focal severe stenosis seen at the contralateral supraclinoid left ICA (series 7, image 113). Right A1 patent. Left A1 hypoplastic and/or absent. Normal anterior communicating artery complex. Anterior cerebral arteries remain patent to their distal aspects without stenosis. Right M1 widely patent. Focal moderate distal left M1 stenosis (series 10, image 22). Normal MCA bifurcations. Distal MCA branches perfused and symmetric. Posterior circulation: Extensive atheromatous disease throughout the V4 segments bilaterally with associated moderate multifocal narrowing on the right, with moderate to severe multifocal narrowing on the left. Distal left V4 segment nearly occludes prior to the vertebrobasilar junction. Right PICA origin remains patent. Left PICA not definitely seen. There is occlusion of the mid basilar artery distally, concerning for occlusive thrombus (series 8, image 110). Perfusion of the distal basilar artery distally, likely collateral. Superior cerebellar arteries perfused and patent bilaterally. Right PCA supplied via the basilar. Multifocal atheromatous irregularity throughout the right PCA without high-grade stenosis. Fetal type origin of the left PCA which remains patent to its distal aspect. Venous sinuses: Patent allowing for timing the contrast bolus. Anatomic variants: Fetal type origin of the left PCA. No visible aneurysm. Review of the MIP images confirms the above findings IMPRESSION: 1. 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 Basilar artery is perfused distally, likely collateral in  nature. Both SCAs and PCAs remain patent and perfused at this time. 2. Extensive atheromatous disease throughout the V4 segments bilaterally with associated moderate to severe multifocal narrowing, left worse than right. 3. Severe atheromatous stenoses of up to 80% at the carotid bifurcations bilaterally. 4. Moderate stenosis at the supraclinoid right ICA, with severe stenosis at the supraclinoid left ICA. 5. Moderate distal left M1 stenosis. 6. Emphysema (ICD10-J43.9). Critical Value/emergent results were called by telephone at the time of interpretation on 03/16/2021 at 12:10 a.m. to provider [facility] , who verbally acknowledged these results. Electronically Signed   By: Jeannine Boga M.D.   On: 03/16/2021 01:43   CT ANGIO NECK CODE STROKE  Result Date: 03/16/2021 CLINICAL DATA:  Initial evaluation for acute stroke. EXAM: CT ANGIOGRAPHY HEAD AND NECK TECHNIQUE: Multidetector CT imaging of the head and neck was performed using the standard protocol during bolus administration of intravenous contrast. Multiplanar CT image reconstructions and MIPs were obtained to evaluate the vascular anatomy. Carotid stenosis measurements (when applicable) are obtained utilizing NASCET criteria, using the distal internal carotid diameter as the denominator. CONTRAST:  80mL OMNIPAQUE IOHEXOL 350 MG/ML SOLN COMPARISON:  CT from 03/15/2021 as well as prior CTA from 05/04/2017. FINDINGS: CTA NECK FINDINGS Aortic arch: Partially visualized aortic arch in origin of the great vessels grossly normal in caliber. No visible stenosis about the origin of the great vessels. Right carotid system: Right CCA patent from its origin to the bifurcation without stenosis. Bulky calcified plaque at the right carotid bulb with associated stenosis of up to 80% by NASCET criteria (series 7, image 222). Right ICA patent distally without stenosis, dissection or occlusion. Left carotid system: Left CCA patent from its origin to the  bifurcation without stenosis. Eccentric mixed plaque at the left carotid bulb with associated stenosis of up to  80% by NASCET criteria (series 7, image 229). Left ICA patent distally without stenosis dissection or occlusion. Vertebral arteries: Both vertebral arteries arise from the subclavian arteries. No visible proximal subclavian artery stenosis. Right vertebral artery slightly dominant. Vertebral arteries patent within the neck without stenosis dissection or occlusion. Skeleton: No discrete or worrisome osseous lesions. Moderate to advanced multilevel spondylosis noted throughout the cervical spine. Postsurgical changes noted at the mandible. Other neck: Multiple hypodense/cystic lesions noted within the subcutaneous fat of the upper face/neck, greater on the left. The largest of these seen on the left and measures 2.8 cm in size (series 5, image 97). Few small foci of gas seen within this lesion. Additional lesion seen slightly inferiorly (series 5, image 112). Focal calcifications seen within a 1.2 cm lesion on the right (series 5, image 115). Findings are most likely benign in reflect sebaceous cysts, and are similar as compared to 2018. No other acute soft tissue abnormality within the neck. Upper chest: Emphysema. Visualized upper chest demonstrates no other acute finding. Review of the MIP images confirms the above findings CTA HEAD FINDINGS Anterior circulation: Petrous segments patent bilaterally. Atheromatous plaque within the carotid siphons bilaterally with associated moderate stenosis at the supraclinoid right ICA (series 7, image 110). Focal severe stenosis seen at the contralateral supraclinoid left ICA (series 7, image 113). Right A1 patent. Left A1 hypoplastic and/or absent. Normal anterior communicating artery complex. Anterior cerebral arteries remain patent to their distal aspects without stenosis. Right M1 widely patent. Focal moderate distal left M1 stenosis (series 10, image 22). Normal  MCA bifurcations. Distal MCA branches perfused and symmetric. Posterior circulation: Extensive atheromatous disease throughout the V4 segments bilaterally with associated moderate multifocal narrowing on the right, with moderate to severe multifocal narrowing on the left. Distal left V4 segment nearly occludes prior to the vertebrobasilar junction. Right PICA origin remains patent. Left PICA not definitely seen. There is occlusion of the mid basilar artery distally, concerning for occlusive thrombus (series 8, image 110). Perfusion of the distal basilar artery distally, likely collateral. Superior cerebellar arteries perfused and patent bilaterally. Right PCA supplied via the basilar. Multifocal atheromatous irregularity throughout the right PCA without high-grade stenosis. Fetal type origin of the left PCA which remains patent to its distal aspect. Venous sinuses: Patent allowing for timing the contrast bolus. Anatomic variants: Fetal type origin of the left PCA. No visible aneurysm. Review of the MIP images confirms the above findings IMPRESSION: 1. 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 Basilar artery is perfused distally, likely collateral in nature. Both SCAs and PCAs remain patent and perfused at this time. 2. Extensive atheromatous disease throughout the V4 segments bilaterally with associated moderate to severe multifocal narrowing, left worse than right. 3. Severe atheromatous stenoses of up to 80% at the carotid bifurcations bilaterally. 4. Moderate stenosis at the supraclinoid right ICA, with severe stenosis at the supraclinoid left ICA. 5. Moderate distal left M1 stenosis. 6. Emphysema (ICD10-J43.9). Critical Value/emergent results were called by telephone at the time of interpretation on 03/16/2021 at 12:10 a.m. to provider [facility] , who verbally acknowledged these results. Electronically Signed   By: Jeannine Boga M.D.   On: 03/16/2021 01:43     PHYSICAL EXAM  Temp:  [98.1  F (36.7 C)-99.3 F (37.4 C)] 99 F (37.2 C) (11/16 0822) Pulse Rate:  [79-90] 83 (11/16 0822) Resp:  [14-20] 14 (11/16 0822) BP: (104-133)/(67-73) 116/73 (11/16 0822) SpO2:  [97 %-99 %] 98 % (11/16 0254)  General - Well  nourished, well developed, in no apparent distress.  Ophthalmologic - fundi not visualized due to noncooperation.  Cardiovascular - Regular rhythm and rate.  Neuro - awake, alert, eyes open, orientated to age, place, time. No aphasia, however moderate to severe dysarthria, following all simple commands. No gaze palsy, tracking bilaterally, visual field full, PERRL, no nystagmus or disconjugate eyes. Mild right facial droop. Tongue midline. LUE and LLE at least 4/5, RUE 3-/5 proximal and distally. RLE 3/5 proximal and 3+/5 distally. Sensation decreased on the left, left FTN ataxia and HTS dysmetria. R HTS dysmetria but seems not out of proportion to the weakness, gait not tested.     ASSESSMENT/PLAN Andre [name] is a 70 y.o. male with history of hypertension, alcohol abuse, smoker, substance abuse, history of stroke, admitted for unresponsiveness, right-sided weakness, left gaze, slurred speech and left facial droop after alcohol abuse.  CT no acute abnormality, CT head and neck mid basilar artery occlusion.  Status post IR with TICI3 and basilar artery stenting.  Stroke: b/l pontine and right PCA scattered infarct secondary to progressive large vessel disease with persistent cocaine abuse, smoker and alcohol abuse Code Stroke CT head No acute abnormality.    CTA head & neck Acute occlusion of mid basilar artery, atheromatous disease in V4 segments with multfocal narrowing, left worse than right, 80% atheromatous stenoses at carotid bifurcations, moderate stenosis of supraclinoid right ICA, severe stenosis of supraclinoid left ICA, moderate distal left M1 stenosis Post IR CT acute infarct in brainstem and right occipital cortex MRI small volume acute ischemic  infarcts in right cerebellum, pons, right occipital lobe and right thalamus with underlying chronic microvascular ischemic disease MRA Patent flow through basilar artery stent and moderate stenosis of supraclinoid left ICA 2D Echo EF 50-55% LDL 54 HgbA1c 4.6 UDS positive for cocaine VTE prophylaxis - Lovenox aspirin 81 mg daily and clopidogrel 75 mg daily prior to admission, now on aspirin 81 mg daily and Brilinta (ticagrelor) 90 mg bid given stenting Therapy recommendations:  CIR Disposition:  pending  History of stroke 05/2015, MRI showed right pontine infarct, remote right BG and cerebellum infarct.  CTA head and neck showed right ICA 60% stenosis, left ICA 50% stenosis, bilateral ICA siphon, VA and BA stenosis.  EF 50 to 55%, LDL 52, UDS positive for cocaine.  Discharged with DAPT and Zocor 04/2017 admitted for right-sided weakness, slurred speech and right facial droop.  MRI showed left pontine infarct.  CT head and neck bilateral ICA bulb 30 to 40% stenosis.  Severe left siphon stenosis, basilar artery stenosis.  EF 60 to 65%.  A1c 4.6, LDL 45, UDS positive for cocaine.  Discharged with DAPT and Lipitor 80.  Progressive intracranial vascular stenosis 05/2015, CT head and neck showed bilateral VAs and BA stenosis, however BA patent 04/2017 CTA head and neck showed similar bilateral VAs and BA stenosis but patent Current admission CTA head and neck showed progressive worsening bilateral V4 stenosis and atherosclerosis.  BA occlusion.  Hypertension Home meds:  none BP goal < 180/105 now Stable off Cleviprex On amlodipine 10 Long-term BP goal normotensive  Hyperlipidemia Home meds:  atorvastatin 80 mg daily, resumed in hospital LDL 54, goal < 70 High intensity statin continued Continue statin at discharge  Cocaine abuse UDS positive for cocaine, patient also used cocaine prior to previous strokes. Social work consult for substance abuse cessation assistance Cessation education  will be provided  Tobacco abuse Current smoker Smoking cessation counseling will be provided  Alcohol abuse  On CIWA protocol FA/MVI/B1 Education on alcohol Limited use will be provided ETOH use, alcohol level 16, will advise to drink no more than 2 drink(s) a day  Other Stroke Risk Factors Advanced Age >/= 60   Other Active Problems    Hospital day # 4      To contact Stroke Continuity provider, please refer to [email or web address] After hours, contact General Neurology

## 2021-03-20 NOTE — Progress Notes (Addendum)
Speech Language Pathology Treatment: Cognitive-Linquistic  Patient Details Name: Andre [name] MRN: [id number] DOB: 01-28-1951 Today's Date: 03/20/2021 Time: 1005-1020 SLP Time Calculation (min) (ACUTE ONLY): 15 min  Assessment / Plan / Recommendation Clinical Impression  Pt seen at bedside for skilled ST intervention targeting goals for functional recall and intelligibility of speech. Pt was ~90% intelligible to this unfamiliar listener, with infrequent need for repetition during conversation. Pt was encouraged to increase vocal intensity and over exaggerate movements of lips and tongue to maximize intelligibility. Pt indicated that he does not use a calendar or write appointments down to facilitate recall, stating "It will come to me". Pt used follow up appointment December 16 re: right wrist as an example, stating that if something is important to him, he will remember it.    HPI HPI: 70 y/o male presented to ED on 03/15/21 after being found altered and R sided weakness. Initial CT head negative. 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 S/p thrombectomy on 11/12. Repeat head CT showed acute infarct likely in brainstem and R occipital cortex. PMH: HTN, prior stroke with residual R weakness, ETOH abuse      SLP Plan  Continue with current plan of care      Recommendations for follow up therapy are one component of a multi-disciplinary discharge planning process, led by the attending physician.  Recommendations may be updated based on patient status, additional functional criteria and insurance authorization.    Recommendations   Rehab after acute hospital stay                Follow Up Recommendations: Skilled nursing-short term rehab (<3 hours/day) Assistance recommended at discharge: Frequent or constant Supervision/Assistance SLP Visit Diagnosis: Dysarthria and anarthria (R47.1);Cognitive communication deficit (R41.841) Plan: Continue with current plan of  care       [place]. Quentin Ore, [facility], [place] Speech Language Pathologist Office: [phone]  Shonna Chock  03/20/2021, 10:23 AM

## 2021-04-03 DIAGNOSIS — M6281 Muscle weakness (generalized): Secondary | ICD-10-CM | POA: Diagnosis not present

## 2021-04-04 DIAGNOSIS — I6322 Cerebral infarction due to unspecified occlusion or stenosis of basilar arteries: Secondary | ICD-10-CM | POA: Diagnosis not present

## 2021-04-04 DIAGNOSIS — I10 Essential (primary) hypertension: Secondary | ICD-10-CM | POA: Diagnosis not present

## 2021-04-04 DIAGNOSIS — R5381 Other malaise: Secondary | ICD-10-CM | POA: Diagnosis not present

## 2021-04-08 ENCOUNTER — Telehealth (HOSPITAL_COMMUNITY): Payer: Self-pay

## 2021-04-08 NOTE — Telephone Encounter (Signed)
Called to schedule 2 wk f/u, no answer. Busy signal. Will try back later. AW

## 2021-04-12 ENCOUNTER — Encounter (HOSPITAL_COMMUNITY): Payer: Self-pay | Admitting: Emergency Medicine

## 2021-04-12 ENCOUNTER — Emergency Department (HOSPITAL_COMMUNITY): Payer: Medicare Other

## 2021-04-12 ENCOUNTER — Inpatient Hospital Stay (HOSPITAL_COMMUNITY)
Admission: EM | Admit: 2021-04-12 | Discharge: 2021-04-19 | DRG: 690 | Disposition: A | Discharge status: Skilled Nursing Facility | Payer: Medicare Other | Attending: Internal Medicine | Admitting: Internal Medicine

## 2021-04-12 DIAGNOSIS — N390 Urinary tract infection, site not specified: Secondary | ICD-10-CM | POA: Diagnosis present

## 2021-04-12 DIAGNOSIS — H269 Unspecified cataract: Secondary | ICD-10-CM | POA: Diagnosis present

## 2021-04-12 DIAGNOSIS — Z8673 Personal history of transient ischemic attack (TIA), and cerebral infarction without residual deficits: Secondary | ICD-10-CM

## 2021-04-12 DIAGNOSIS — D649 Anemia, unspecified: Secondary | ICD-10-CM | POA: Diagnosis present

## 2021-04-12 DIAGNOSIS — I69322 Dysarthria following cerebral infarction: Secondary | ICD-10-CM

## 2021-04-12 DIAGNOSIS — N3000 Acute cystitis without hematuria: Secondary | ICD-10-CM | POA: Diagnosis not present

## 2021-04-12 DIAGNOSIS — Z20822 Contact with and (suspected) exposure to covid-19: Secondary | ICD-10-CM | POA: Diagnosis present

## 2021-04-12 DIAGNOSIS — N179 Acute kidney failure, unspecified: Secondary | ICD-10-CM | POA: Diagnosis present

## 2021-04-12 DIAGNOSIS — Z79899 Other long term (current) drug therapy: Secondary | ICD-10-CM

## 2021-04-12 DIAGNOSIS — Z8616 Personal history of COVID-19: Secondary | ICD-10-CM

## 2021-04-12 DIAGNOSIS — R0602 Shortness of breath: Secondary | ICD-10-CM

## 2021-04-12 DIAGNOSIS — Z7982 Long term (current) use of aspirin: Secondary | ICD-10-CM

## 2021-04-12 DIAGNOSIS — R7881 Bacteremia: Secondary | ICD-10-CM | POA: Diagnosis present

## 2021-04-12 DIAGNOSIS — Z8249 Family history of ischemic heart disease and other diseases of the circulatory system: Secondary | ICD-10-CM

## 2021-04-12 DIAGNOSIS — Z1629 Resistance to other single specified antibiotic: Secondary | ICD-10-CM | POA: Diagnosis present

## 2021-04-12 DIAGNOSIS — B9620 Unspecified Escherichia coli [E. coli] as the cause of diseases classified elsewhere: Secondary | ICD-10-CM | POA: Diagnosis present

## 2021-04-12 DIAGNOSIS — R64 Cachexia: Secondary | ICD-10-CM | POA: Diagnosis present

## 2021-04-12 DIAGNOSIS — R531 Weakness: Secondary | ICD-10-CM

## 2021-04-12 DIAGNOSIS — R54 Age-related physical debility: Secondary | ICD-10-CM | POA: Diagnosis present

## 2021-04-12 DIAGNOSIS — Z681 Body mass index (BMI) 19 or less, adult: Secondary | ICD-10-CM

## 2021-04-12 DIAGNOSIS — Z7901 Long term (current) use of anticoagulants: Secondary | ICD-10-CM

## 2021-04-12 DIAGNOSIS — E440 Moderate protein-calorie malnutrition: Secondary | ICD-10-CM | POA: Diagnosis present

## 2021-04-12 DIAGNOSIS — Z806 Family history of leukemia: Secondary | ICD-10-CM

## 2021-04-12 DIAGNOSIS — I10 Essential (primary) hypertension: Secondary | ICD-10-CM | POA: Diagnosis present

## 2021-04-12 DIAGNOSIS — R262 Difficulty in walking, not elsewhere classified: Secondary | ICD-10-CM

## 2021-04-12 DIAGNOSIS — F17210 Nicotine dependence, cigarettes, uncomplicated: Secondary | ICD-10-CM | POA: Diagnosis present

## 2021-04-12 DIAGNOSIS — I69351 Hemiplegia and hemiparesis following cerebral infarction affecting right dominant side: Secondary | ICD-10-CM

## 2021-04-12 DIAGNOSIS — F17200 Nicotine dependence, unspecified, uncomplicated: Secondary | ICD-10-CM | POA: Diagnosis present

## 2021-04-12 LAB — BASIC METABOLIC PANEL
Anion gap: 9 (ref 5–15)
BUN: 21 mg/dL (ref 8–23)
CO2: 25 mmol/L (ref 22–32)
Calcium: 9.6 mg/dL (ref 8.9–10.3)
Chloride: 105 mmol/L (ref 98–111)
Creatinine, Ser: 1.47 mg/dL — ABNORMAL HIGH (ref 0.61–1.24)
GFR, Estimated: 52 mL/min — ABNORMAL LOW (ref >60)
Glucose, Bld: 107 mg/dL — ABNORMAL HIGH (ref 70–99)
Potassium: 4.3 mmol/L (ref 3.5–5.1)
Sodium: 139 mmol/L (ref 135–145)

## 2021-04-12 LAB — CBC
HCT: 42 % (ref 39.0–52.0)
Hemoglobin: 12.4 g/dL — ABNORMAL LOW (ref 13.0–17.0)
MCH: 28.5 pg (ref 26.0–34.0)
MCHC: 29.5 g/dL — ABNORMAL LOW (ref 30.0–36.0)
MCV: 96.6 fL (ref 80.0–100.0)
Platelets: 202 10*3/uL (ref 150–400)
RBC: 4.35 MIL/uL (ref 4.22–5.81)
RDW: 13.2 % (ref 11.5–15.5)
WBC: 14.1 10*3/uL — ABNORMAL HIGH (ref 4.0–10.5)
nRBC: 0 % (ref 0.0–0.2)

## 2021-04-12 NOTE — ED Provider Notes (Signed)
Emergency Medicine Provider Triage Evaluation Note  Andre [name] , a 70 y.o. male  was evaluated in triage.  Pt complains of weakness and difficulty ambulating since d/c from [place]. The patient report he fell today and hi t his head on the wall.   Review of Systems  Positive: Weakness, falls  Negative: SOB, chest pain, abdominal pain, urinary complaints  Physical Exam  BP 100/61 (BP Location: Left Arm)   Pulse 94   Temp 99.2 F (37.3 C)   Resp 18   SpO2 99%  Gen:   Awake, no distress   Resp:  Normal effort MSK:   RLE weakness which the patient reports is baseline for him Other:  Strong equal grip strength. Autraunmatic head, but tender to palpation on the superior portion. No deformitiy. No c-spine tenderness.No obv step offs or deformities.  Medical Decision Making  Medically screening exam initiated at 4:24 PM.  Appropriate orders placed.  Andre [name] was informed that the remainder of the evaluation will be completed by another provider, this initial triage assessment does not replace that evaluation, and the importance of remaining in the ED until their evaluation is complete.  Labs and imaging placed   Andre [name], [place] 04/12/21 1629    Tegeler, Gwenyth Allegra, MD 04/12/21 2246

## 2021-04-12 NOTE — ED Triage Notes (Signed)
Pt here via [facility] from home. Pt recently dc from [place] 1 wk ago, pt has had increased weakness and difficulty ambulating w/ walker since leaving rehab. Decreased appetite x3 days.

## 2021-04-13 ENCOUNTER — Encounter (HOSPITAL_COMMUNITY): Payer: Self-pay | Admitting: Student in an Organized Health Care Education/Training Program

## 2021-04-13 ENCOUNTER — Emergency Department (HOSPITAL_COMMUNITY): Payer: Medicare Other

## 2021-04-13 ENCOUNTER — Other Ambulatory Visit: Payer: Self-pay

## 2021-04-13 ENCOUNTER — Inpatient Hospital Stay (HOSPITAL_COMMUNITY): Payer: Medicare Other

## 2021-04-13 DIAGNOSIS — N179 Acute kidney failure, unspecified: Secondary | ICD-10-CM | POA: Diagnosis present

## 2021-04-13 DIAGNOSIS — N390 Urinary tract infection, site not specified: Secondary | ICD-10-CM | POA: Diagnosis present

## 2021-04-13 DIAGNOSIS — I69351 Hemiplegia and hemiparesis following cerebral infarction affecting right dominant side: Secondary | ICD-10-CM | POA: Diagnosis not present

## 2021-04-13 DIAGNOSIS — I69322 Dysarthria following cerebral infarction: Secondary | ICD-10-CM | POA: Diagnosis not present

## 2021-04-13 DIAGNOSIS — B9620 Unspecified Escherichia coli [E. coli] as the cause of diseases classified elsewhere: Secondary | ICD-10-CM | POA: Diagnosis present

## 2021-04-13 DIAGNOSIS — F17210 Nicotine dependence, cigarettes, uncomplicated: Secondary | ICD-10-CM | POA: Diagnosis present

## 2021-04-13 DIAGNOSIS — R54 Age-related physical debility: Secondary | ICD-10-CM | POA: Diagnosis present

## 2021-04-13 DIAGNOSIS — Z8616 Personal history of COVID-19: Secondary | ICD-10-CM | POA: Diagnosis not present

## 2021-04-13 DIAGNOSIS — Z681 Body mass index (BMI) 19 or less, adult: Secondary | ICD-10-CM | POA: Diagnosis not present

## 2021-04-13 DIAGNOSIS — I10 Essential (primary) hypertension: Secondary | ICD-10-CM | POA: Diagnosis present

## 2021-04-13 DIAGNOSIS — Z7901 Long term (current) use of anticoagulants: Secondary | ICD-10-CM | POA: Diagnosis not present

## 2021-04-13 DIAGNOSIS — Z8249 Family history of ischemic heart disease and other diseases of the circulatory system: Secondary | ICD-10-CM | POA: Diagnosis not present

## 2021-04-13 DIAGNOSIS — E440 Moderate protein-calorie malnutrition: Secondary | ICD-10-CM | POA: Diagnosis present

## 2021-04-13 DIAGNOSIS — Z1629 Resistance to other single specified antibiotic: Secondary | ICD-10-CM | POA: Diagnosis present

## 2021-04-13 DIAGNOSIS — R64 Cachexia: Secondary | ICD-10-CM | POA: Diagnosis present

## 2021-04-13 DIAGNOSIS — Z79899 Other long term (current) drug therapy: Secondary | ICD-10-CM | POA: Diagnosis not present

## 2021-04-13 DIAGNOSIS — Z7982 Long term (current) use of aspirin: Secondary | ICD-10-CM | POA: Diagnosis not present

## 2021-04-13 DIAGNOSIS — R7881 Bacteremia: Secondary | ICD-10-CM | POA: Diagnosis present

## 2021-04-13 DIAGNOSIS — D649 Anemia, unspecified: Secondary | ICD-10-CM | POA: Diagnosis present

## 2021-04-13 DIAGNOSIS — R531 Weakness: Secondary | ICD-10-CM | POA: Diagnosis not present

## 2021-04-13 DIAGNOSIS — Z20822 Contact with and (suspected) exposure to covid-19: Secondary | ICD-10-CM | POA: Diagnosis present

## 2021-04-13 DIAGNOSIS — N3000 Acute cystitis without hematuria: Principal | ICD-10-CM

## 2021-04-13 DIAGNOSIS — H269 Unspecified cataract: Secondary | ICD-10-CM | POA: Diagnosis present

## 2021-04-13 DIAGNOSIS — Z806 Family history of leukemia: Secondary | ICD-10-CM | POA: Diagnosis not present

## 2021-04-13 HISTORY — DX: Urinary tract infection, site not specified: N39.0

## 2021-04-13 LAB — BLOOD CULTURE ID PANEL (REFLEXED) - BCID2

## 2021-04-13 LAB — CBC WITH DIFFERENTIAL/PLATELET
Abs Immature Granulocytes: 0.09 10*3/uL — ABNORMAL HIGH (ref 0.00–0.07)
Basophils Absolute: 0 10*3/uL (ref 0.0–0.1)
Basophils Relative: 0 %
Eosinophils Absolute: 0 10*3/uL (ref 0.0–0.5)
Eosinophils Relative: 0 %
HCT: 35.3 % — ABNORMAL LOW (ref 39.0–52.0)
Hemoglobin: 11.2 g/dL — ABNORMAL LOW (ref 13.0–17.0)
Immature Granulocytes: 1 %
Lymphocytes Relative: 10 %
Lymphs Abs: 1.5 10*3/uL (ref 0.7–4.0)
MCH: 29.7 pg (ref 26.0–34.0)
MCHC: 31.7 g/dL (ref 30.0–36.0)
MCV: 93.6 fL (ref 80.0–100.0)
Monocytes Absolute: 1.5 10*3/uL — ABNORMAL HIGH (ref 0.1–1.0)
Monocytes Relative: 9 %
Neutro Abs: 12.7 10*3/uL — ABNORMAL HIGH (ref 1.7–7.7)
Neutrophils Relative %: 80 %
Platelets: 204 10*3/uL (ref 150–400)
RBC: 3.77 MIL/uL — ABNORMAL LOW (ref 4.22–5.81)
RDW: 13.3 % (ref 11.5–15.5)
WBC: 15.7 10*3/uL — ABNORMAL HIGH (ref 4.0–10.5)
nRBC: 0 % (ref 0.0–0.2)

## 2021-04-13 LAB — COMPREHENSIVE METABOLIC PANEL
ALT: 46 U/L — ABNORMAL HIGH (ref 0–44)
AST: 43 U/L — ABNORMAL HIGH (ref 15–41)
Albumin: 2.6 g/dL — ABNORMAL LOW (ref 3.5–5.0)
Alkaline Phosphatase: 62 U/L (ref 38–126)
Anion gap: 7 (ref 5–15)
BUN: 28 mg/dL — ABNORMAL HIGH (ref 8–23)
CO2: 27 mmol/L (ref 22–32)
Calcium: 8.9 mg/dL (ref 8.9–10.3)
Chloride: 107 mmol/L (ref 98–111)
Creatinine, Ser: 1.56 mg/dL — ABNORMAL HIGH (ref 0.61–1.24)
GFR, Estimated: 48 mL/min — ABNORMAL LOW (ref >60)
Glucose, Bld: 130 mg/dL — ABNORMAL HIGH (ref 70–99)
Potassium: 3.7 mmol/L (ref 3.5–5.1)
Sodium: 141 mmol/L (ref 135–145)
Total Bilirubin: 1.3 mg/dL — ABNORMAL HIGH (ref 0.3–1.2)
Total Protein: 6.8 g/dL (ref 6.5–8.1)

## 2021-04-13 LAB — URINALYSIS, ROUTINE W REFLEX MICROSCOPIC
Bilirubin Urine: NEGATIVE
Glucose, UA: NEGATIVE mg/dL
Ketones, ur: NEGATIVE mg/dL
Nitrite: NEGATIVE
Protein, ur: 100 mg/dL — AB
Specific Gravity, Urine: 1.013 (ref 1.005–1.030)
WBC, UA: >50 WBC/hpf — ABNORMAL HIGH (ref 0–5)
pH: 5 (ref 5.0–8.0)

## 2021-04-13 LAB — ETHANOL: Alcohol, Ethyl (B): <10 mg/dL (ref <10)

## 2021-04-13 LAB — CBG MONITORING, ED: Glucose-Capillary: 104 mg/dL — ABNORMAL HIGH (ref 70–99)

## 2021-04-13 LAB — RESP PANEL BY RT-PCR (FLU A&B, COVID) ARPGX2
Influenza A by PCR: NEGATIVE
Influenza B by PCR: NEGATIVE
SARS Coronavirus 2 by RT PCR: NEGATIVE

## 2021-04-13 LAB — CK: Total CK: 146 U/L (ref 49–397)

## 2021-04-13 MED ORDER — SODIUM CHLORIDE 0.9 % IV SOLN: 1.0000 g | INTRAVENOUS | Status: DC

## 2021-04-13 MED ORDER — GERHARDT'S BUTT CREAM
TOPICAL_CREAM | Freq: Once | CUTANEOUS | Status: AC
  Filled 2021-04-13: qty 1

## 2021-04-13 MED ORDER — ACETAMINOPHEN 650 MG RE SUPP: 650.0000 mg | Freq: Four times a day (QID) | RECTAL | Status: DC | PRN

## 2021-04-13 MED ORDER — PROSIGHT PO TABS
1.0000 | ORAL_TABLET | Freq: Every day | ORAL | Status: DC
Start: 2021-04-13 — End: 2021-04-19
  Administered 2021-04-13 – 2021-04-19 (×7): 1 via ORAL
  Filled 2021-04-13 (×8): qty 1

## 2021-04-13 MED ORDER — TICAGRELOR 90 MG PO TABS
90.0000 mg | ORAL_TABLET | Freq: Two times a day (BID) | ORAL | Status: DC
  Administered 2021-04-13 – 2021-04-19 (×13): 90 mg via ORAL
  Filled 2021-04-13 (×14): qty 1

## 2021-04-13 MED ORDER — AMLODIPINE BESYLATE 10 MG PO TABS
10.0000 mg | ORAL_TABLET | Freq: Every day | ORAL | Status: DC
  Administered 2021-04-13 – 2021-04-19 (×7): 10 mg via ORAL
  Filled 2021-04-13 (×6): qty 1
  Filled 2021-04-13: qty 2

## 2021-04-13 MED ORDER — SODIUM CHLORIDE 0.9 % IV BOLUS
1000.0000 mL | Freq: Once | INTRAVENOUS | Status: AC
  Administered 2021-04-13: 1000 mL via INTRAVENOUS

## 2021-04-13 MED ORDER — ENOXAPARIN SODIUM 40 MG/0.4ML IJ SOSY
40.0000 mg | PREFILLED_SYRINGE | INTRAMUSCULAR | Status: DC
  Administered 2021-04-13 – 2021-04-18 (×6): 40 mg via SUBCUTANEOUS
  Filled 2021-04-13 (×6): qty 0.4

## 2021-04-13 MED ORDER — THIAMINE HCL 100 MG PO TABS
100.0000 mg | ORAL_TABLET | Freq: Every day | ORAL | Status: DC
  Administered 2021-04-13 – 2021-04-19 (×7): 100 mg via ORAL
  Filled 2021-04-13 (×7): qty 1

## 2021-04-13 MED ORDER — FOLIC ACID 1 MG PO TABS
1.0000 mg | ORAL_TABLET | Freq: Every day | ORAL | Status: DC
  Administered 2021-04-13 – 2021-04-19 (×7): 1 mg via ORAL
  Filled 2021-04-13 (×7): qty 1

## 2021-04-13 MED ORDER — SODIUM CHLORIDE 0.9 % IV SOLN
2.0000 g | INTRAVENOUS | Status: DC
  Administered 2021-04-14 – 2021-04-15 (×2): 2 g via INTRAVENOUS
  Filled 2021-04-13 (×2): qty 20

## 2021-04-13 MED ORDER — ATORVASTATIN CALCIUM 80 MG PO TABS
80.0000 mg | ORAL_TABLET | Freq: Every day | ORAL | Status: DC
  Administered 2021-04-13 – 2021-04-19 (×7): 80 mg via ORAL
  Filled 2021-04-13 (×6): qty 1
  Filled 2021-04-13: qty 2

## 2021-04-13 MED ORDER — ASPIRIN 81 MG PO CHEW
81.0000 mg | CHEWABLE_TABLET | Freq: Every day | ORAL | Status: DC
  Administered 2021-04-13 – 2021-04-19 (×7): 81 mg via ORAL
  Filled 2021-04-13 (×7): qty 1

## 2021-04-13 MED ORDER — SODIUM CHLORIDE 0.9 % IV SOLN
1.0000 g | Freq: Once | INTRAVENOUS | Status: AC
  Administered 2021-04-13: 1 g via INTRAVENOUS
  Filled 2021-04-13: qty 10

## 2021-04-13 MED ORDER — ACETAMINOPHEN 325 MG PO TABS
650.0000 mg | ORAL_TABLET | Freq: Four times a day (QID) | ORAL | Status: DC | PRN
  Administered 2021-04-13: 650 mg via ORAL
  Filled 2021-04-13: qty 2

## 2021-04-13 NOTE — ED Notes (Signed)
Pt placed on bedpan, then cleaned up, chux and brief changed.

## 2021-04-13 NOTE — Evaluation (Signed)
Physical Therapy Evaluation Patient Details Name: Andre [name] MRN: [id number] DOB: 29-Mar-1951 Today's Date: 04/13/2021  History of Present Illness  70 y/o male presented to ED about 1 wk after d/c from [place] with weakness and inability to ambulate as well as having not taken his meds because his roommate could not get them. Pt fell at home and hit his head.  PMH: HTN, prior stroke with residual R weakness, ETOH abuse. Most recently pt had R sided weakness 03/15/21 and underwent thrombectomy of mid basilar artery with infarct in brainstem and R occipital cortex.  Clinical Impression  Pt admitted with above diagnosis. Pt reports that he has been unable to get up and ambulate at home and last time he did he fell and hit his head. CT neg for acute process. He reports his roommate has not been giving him food and he has not has his meds. His roommate does not drive but apparently her sister was supposed to get his meds and this did not happen. Pt required mod A to come to EOB and stand to RW. Pt could not maintain standing long enough to ambulate due to quick fatigue. Of note, pt with R hand tremor that he reports is due to being cold? Given poor success with return home, recommend SNF for further therapy. Of note, pt reports he was supposed to get HHPT but they did not call so this did not start.  Pt currently with functional limitations due to the deficits listed below (see PT Problem List). Pt will benefit from skilled PT to increase their independence and safety with mobility to allow discharge to the venue listed below.          Recommendations for follow up therapy are one component of a multi-disciplinary discharge planning process, led by the attending physician.  Recommendations may be updated based on patient status, additional functional criteria and insurance authorization.  Follow Up Recommendations Skilled nursing-short term rehab (<3 hours/day)    Assistance Recommended at  Discharge Frequent or constant Supervision/Assistance  Functional Status Assessment Patient has had a recent decline in their functional status and demonstrates the ability to make significant improvements in function in a reasonable and predictable amount of time.  Equipment Recommendations  None recommended by PT    Recommendations for Other Services OT consult     Precautions / Restrictions Precautions Precautions: Fall Restrictions Weight Bearing Restrictions: No      Mobility  Bed Mobility Overal bed mobility: Needs Assistance Bed Mobility: Supine to Sit;Sit to Supine     Supine to sit: Mod assist Sit to supine: Mod assist   General bed mobility comments: pt on stretcher in ED, mod A and use of rail to come to sitting. Pt unable to lift LE's back into bed for return to supine, mod A for this and for positioning    Transfers Overall transfer level: Needs assistance Equipment used: Rolling walker (2 wheels) Transfers: Sit to/from Stand Sit to Stand: Mod assist           General transfer comment: mod A for power up    Ambulation/Gait               General Gait Details: pt could not maintain standing for long enough to progress ambulation, took some steps in place before needing to return to sitting  Stairs            Wheelchair Mobility    Modified Rankin (Stroke Patients Only)  Balance Overall balance assessment: Needs assistance;History of Falls Sitting-balance support: Feet supported;Bilateral upper extremity supported Sitting balance-[name]: Fair     Standing balance support: During functional activity;Bilateral upper extremity supported Standing balance-[name]: Poor Standing balance comment: heavy reliance on RW and needed external support                             Pertinent Vitals/Pain Pain Assessment: No/denies pain    Home Living Family/patient expects to be discharged to:: Skilled nursing facility                    Additional Comments: lives with roommate felicia    Prior Function Prior Level of Function : Independent/Modified Independent             Mobility Comments: walks with cane, uses public transportation prior to last admission       Hand Dominance   Dominant Hand: Right    Extremity/Trunk Assessment   Upper Extremity Assessment Upper Extremity Assessment: Defer to OT evaluation    Lower Extremity Assessment Lower Extremity Assessment: Generalized weakness    Cervical / Trunk Assessment Cervical / Trunk Assessment: Kyphotic  Communication   Communication: Other (comment) (low voice)  Cognition Arousal/Alertness: Awake/alert Behavior During Therapy: Flat affect Overall Cognitive Status: No family/caregiver present to determine baseline cognitive functioning                                 General Comments: has difficulty giving detailed history        General Comments General comments (skin integrity, edema, etc.): pulse ox not reading, RR 36, HR 76 bpm    Exercises     Assessment/Plan    PT Assessment Patient needs continued PT services  PT Problem List Decreased strength;Decreased activity tolerance;Decreased balance;Decreased mobility;Decreased cognition       PT Treatment Interventions DME instruction;Gait training;Functional mobility training;Therapeutic activities;Therapeutic exercise;Balance training;Patient/family education    PT Goals (Current goals can be found in the Care Plan section)  Acute Rehab PT Goals Patient Stated Goal: get his meds, get stronger PT Goal Formulation: With patient Time For Goal Achievement: 04/27/21 Potential to Achieve Goals: Fair    Frequency Min 2X/week   Barriers to discharge Decreased caregiver support pt's roommate's sister was apparently supposed to get pt's meds but this did not happen, pt also reports he was not eating at home    Co-evaluation                AM-PAC PT "6 Clicks" Mobility  Outcome Measure Help needed turning from your back to your side while in a flat bed without using bedrails?: A Little Help needed moving from lying on your back to sitting on the side of a flat bed without using bedrails?: A Lot Help needed moving to and from a bed to a chair (including a wheelchair)?: A Lot Help needed standing up from a chair using your arms (e.g., wheelchair or bedside chair)?: A Lot Help needed to walk in hospital room?: Total Help needed climbing 3-5 steps with a railing? : Total 6 Click Score: 11    End of Session   Activity Tolerance: Patient limited by fatigue Patient left: in bed;with call bell/phone within reach Nurse Communication: Mobility status PT Visit Diagnosis: Unsteadiness on feet (R26.81);History of falling (Z91.81);Difficulty in walking, not elsewhere classified (R26.2);Muscle weakness (generalized) (M62.81)  Time: 4210-3128 PT Time Calculation (min) (ACUTE ONLY): 13 min   Charges:   PT Evaluation $PT Eval Moderate Complexity: [place]  Pager [phone] Office Uncertain 04/13/2021, 3:50 PM
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
## 2021-04-13 NOTE — Progress Notes (Addendum)
PHARMACY - PHYSICIAN COMMUNICATION CRITICAL VALUE ALERT - BLOOD CULTURE IDENTIFICATION (BCID)  Andre [name] is an 70 y.o. male who presented to [facility] on 04/12/2021 with UTI  Assessment:  Blood cultures show GNR and BCID shows Ecoli  with no resistance.  Name of physician (or Provider) Contacted:  M. Patel  Current antibiotics: Rocephin 1gm IV q24h  Changes to prescribed antibiotics recommended: Rocephin 2gm IV q24h Recommendations accepted by provider  Results for orders placed or performed during the hospital encounter of 04/12/21  Blood Culture ID Panel (Reflexed) (Collected: 04/13/2021  7:04 AM)  Result Value Ref Range   Enterococcus faecalis NOT DETECTED NOT DETECTED   Enterococcus Faecium NOT DETECTED NOT DETECTED   Listeria monocytogenes NOT DETECTED NOT DETECTED   Staphylococcus species NOT DETECTED NOT DETECTED   Staphylococcus aureus (BCID) NOT DETECTED NOT DETECTED   Staphylococcus epidermidis NOT DETECTED NOT DETECTED   Staphylococcus lugdunensis NOT DETECTED NOT DETECTED   Streptococcus species NOT DETECTED NOT DETECTED   Streptococcus agalactiae NOT DETECTED NOT DETECTED   Streptococcus pneumoniae NOT DETECTED NOT DETECTED   Streptococcus pyogenes NOT DETECTED NOT DETECTED   A.calcoaceticus-baumannii NOT DETECTED NOT DETECTED   Bacteroides fragilis NOT DETECTED NOT DETECTED   Enterobacterales DETECTED (A) NOT DETECTED   Enterobacter cloacae complex NOT DETECTED NOT DETECTED   Escherichia coli DETECTED (A) NOT DETECTED   Klebsiella aerogenes NOT DETECTED NOT DETECTED   Klebsiella oxytoca NOT DETECTED NOT DETECTED   Klebsiella pneumoniae NOT DETECTED NOT DETECTED   Proteus species NOT DETECTED NOT DETECTED   Salmonella species NOT DETECTED NOT DETECTED   Serratia marcescens NOT DETECTED NOT DETECTED   Haemophilus influenzae NOT DETECTED NOT DETECTED   Neisseria meningitidis NOT DETECTED NOT DETECTED   Pseudomonas aeruginosa NOT DETECTED NOT DETECTED    Stenotrophomonas maltophilia NOT DETECTED NOT DETECTED   Candida albicans NOT DETECTED NOT DETECTED   Candida auris NOT DETECTED NOT DETECTED   Candida glabrata NOT DETECTED NOT DETECTED   Candida krusei NOT DETECTED NOT DETECTED   Candida parapsilosis NOT DETECTED NOT DETECTED   Candida tropicalis NOT DETECTED NOT DETECTED   Cryptococcus neoformans/gattii NOT DETECTED NOT DETECTED   CTX-M ESBL NOT DETECTED NOT DETECTED   Carbapenem resistance IMP NOT DETECTED NOT DETECTED   Carbapenem resistance KPC NOT DETECTED NOT DETECTED   Carbapenem resistance NDM NOT DETECTED NOT DETECTED   Carbapenem resist OXA 48 LIKE NOT DETECTED NOT DETECTED   Carbapenem resistance VIM NOT DETECTED NOT DETECTED   Hildred Laser, PharmD Clinical Pharmacist **Pharmacist phone directory can now be found on [email or web address] ([email or web address]).  Listed under [place].

## 2021-04-13 NOTE — TOC Initial Note (Signed)
Transition of Care [facility]) - Initial/Assessment Note    Patient Details  Name: Andre [name] MRN: [id number] Date of Birth: 06-08-1950  [facility]) CM/SW Contact:    Verdell Carmine, RN Phone Number: 04/13/2021, 10:20 AM  Clinical Narrative:             70 yo male presented with progressive weakness In AKI,Was DC from [facility] recently. admits to large amount of of alcohol, uses Mariajuana, cocaine. and is a smoker. Needs SA counseling.the patient and OT consulted will likely need SNF  for reconditioning, although not sure how many SNF days he has left.  Lives with room mates. CM and CSW will follow for needs, recommendations, and transitions.  Expected Discharge Plan: Skilled Nursing Facility Barriers to Discharge: Continued Medical Work up   Patient Goals and CMS Choice        Expected Discharge Plan and Services Expected Discharge Plan: [place] In-house Referral: Clinical Social Work Discharge Planning Services: CM Consult   Living arrangements for the past 2 months: [place]                                      Prior Living Arrangements/Services Living arrangements for the past 2 months: Single Family Home Lives with:: Roommate Patient language and need for interpreter reviewed:: Yes        Need for Family Participation in Patient Care: Yes (Comment) Care giver support system in place?: Yes (comment)   Criminal Activity/Legal Involvement Pertinent to Current Situation/Hospitalization: No - Comment as needed  Activities of Daily Living      Permission Sought/Granted                  Emotional Assessment       Orientation: : Oriented to Self, Oriented to Place, Oriented to  Time, Oriented to Situation Alcohol / Substance Use: Illicit Drugs, Alcohol Use, Tobacco Use Psych Involvement: No (comment)  Admission diagnosis:  UTI (urinary tract infection) [N39.0] Patient Active Problem List   Diagnosis Date  Noted   UTI (urinary tract infection) 04/13/2021   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[place]) 03/16/2021   Acute respiratory failure with hypoxia ([place])    Closed fracture of right distal radius 10/30/2020   SIRS (systemic inflammatory response syndrome) ([place]) 12/30/2019   Acute cholecystitis 10/11/2018   Furuncle of face 08/07/2017   Localized swelling, mass or lump of neck 06/17/2017   Healthcare maintenance 09/06/2015   Essential hypertension 05/09/2015   History of CVA (cerebrovascular accident) 05/09/2015   Tobacco use disorder    HLD (hyperlipidemia)    Alcohol abuse    Polysubstance abuse ([place]) 03/31/2011   PCP:  Maudie Mercury, MD Pharmacy:   [facility], [place] [address] Phone: [phone] Fax: [phone]  [id number] - [address], [address] AT [address] [place] [address] Phone: [phone] Fax: [phone]     Social Determinants of Health (SDOH) Interventions    Readmission Risk Interventions No flowsheet data found.

## 2021-04-13 NOTE — H&P (Addendum)
NAME:  Andre [name], MRN:  [id number], DOB:  01/06/1951, LOS: 0 ADMISSION DATE:  04/12/2021, Primary: Maudie Mercury, MD  CHIEF COMPLAINT:  weakness   Medical Service: Internal Medicine Teaching Service         Attending Physician: Dr. Evette Doffing, Mallie Mussel, *    First Contact: Dr. Vinetta Bergamo Pager: [phone]  Second Contact: Dr. 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  Upon the first couple of days of returning home, he says that he felt okay however after that, it is when he started to notice increasing weakness.  He notes that he lives in a house with some friends.  His weakness progressed to the extent of him being unable to ambulate to the bathroom resulting in him being incontinent. He additionally notes that he was unable to obtain his medications since discharge from [place] due to transportation issues and generalized weakness.  Over the past 2 days, he endorses a loss of appetite and general malaise.  He denies fever, chills, nausea, vomiting, constipation, diarrhea, abdominal/flank pain, dysuria, LUTS, dizziness, or acute neurologic changes.  Past Medical History  He,  has a past medical history of Benign essential HTN (05/09/2015), COVID-19 virus infection (12/30/2019), History of blood transfusion, Hypertension, Polysubstance abuse ([place]), Stroke ([place]) (11/12012), and Stroke ([place]) (05/04/2017).   Home Medications     Prior to Admission medications   Medication Sig Start Date End Date Taking? Authorizing Provider  amLODipine (NORVASC) 10 MG tablet Take 1 tablet (10 mg total) by mouth  daily. 03/21/21  Yes Dennison Mascot, PA-C  aspirin 81 MG chewable tablet Chew 1 tablet (81 mg total) by mouth daily. 03/21/21  Yes Dennison Mascot, PA-C  atorvastatin (LIPITOR) 80 MG tablet Take 1 tablet (80 mg total) by mouth daily. 03/21/21  Yes Dennison Mascot, PA-C  folic acid (FOLVITE) 1 MG tablet Take 1 tablet (1 mg total) by mouth daily. 03/21/21  Yes Dennison Mascot, PA-C  multivitamin (PROSIGHT) TABS tablet Take 1 tablet by mouth daily. 03/21/21  Yes Dennison Mascot, PA-C  thiamine 100 MG tablet Take 1 tablet (100 mg total) by mouth daily. 03/21/21  Yes Dennison Mascot, PA-C  ticagrelor (BRILINTA) 90 MG TABS tablet Take 1 tablet (90 mg total) by mouth 2 (two) times daily. 03/20/21  Yes Dennison Mascot, PA-C    Allergies    Allergies as of 04/12/2021   (No Known Allergies)    Social History   reports that he has been smoking cigarettes. He has a 50.00 pack-year smoking history. He has never used smokeless tobacco. He reports current alcohol use of about 42.0 standard drinks per week. He reports current drug use. Drugs: Cocaine and Marijuana.   Family History   His family history includes Cancer in his mother; Hypertension in his father and mother. There is no history of Heart disease or Diabetes.   ROS  10 point review of systems negative unless stated in the HPI.  Objective   Blood pressure 130/83, pulse 94, temperature 99.1 F (37.3 C), temperature source Oral, resp. rate (!) 24, SpO2 98 %.  General: Chronically ill, frail appearing gentleman in no distress HEENT: Dry mucous membranes, head atraumatic, no scleral icterus or conjunctival injection, bilateral cataracts Cardiac: Heart regular rate and rhythm, no lower extremity edema Pulm: Breathing comfortably on room air, upper airway sounds that were easily cleared with coughing, diminished lung sounds throughout GI: Abdomen soft, nontender, nondistended.  No suprapubic or CVA tenderness. Neuro: Alert and  oriented x4.  3 out of 5 strength in the right upper and lower extremities.  4 out of 5 strength on the left. Skin: Redness of the perineal and buttocks regions.  No open lesions from what I could appreciate on exam MSK: Muscle atrophy throughout, cachectic appearing  Labs    CBC Latest Ref Rng & Units 04/12/2021 03/18/2021 03/17/2021  WBC 4.0 - 10.5 K/uL 14.1(H) 7.0 8.4  Hemoglobin 13.0 - 17.0 g/dL 12.4(L) 12.0(L) 12.1(L)  Hematocrit 39.0 - 52.0 % 42.0 37.8(L) 38.9(L)  Platelets 150 - 400 K/uL 202 158 171   BMP Latest Ref Rng & Units 04/12/2021 03/18/2021 03/17/2021  Glucose 70 - 99 mg/dL 107(H) 92 90  BUN 8 - 23 mg/dL 21 13 9   Creatinine 0.61 - 1.24 mg/dL 1.47(H) 1.13 1.16  Sodium 135 - 145 mmol/L 139 136 139  Potassium 3.5 - 5.1 mmol/L 4.3 4.0 4.0  Chloride 98 - 111 mmol/L 105 104 107  CO2 22 - 32 mmol/L 25 24 26   Calcium 8.9 - 10.3 mg/dL 9.6 8.6(L) 8.5(L)    Summary  70 year old male who was recently admitted 11/11 - 03/20/2021 for bilateral pontine and right PCA infarcts status post basilar artery stenting and was subsequently discharged to [facility].  He was discharged home from there on 12/2 however his functional status has been declining since then with progressive weakness.  He is admitted to our service for UTI management.  Suspect he will need further rehab at discharge.  Assessment & Plan:  Principal Problem:   UTI (urinary tract infection)  Urinary tract infection UA remarkable for many bacteria, large leukocytes, greater than 50 WBCs, 11-20 RBC/hpf.  He denies urinary symptoms however, based on his declining functional status since he has returned home, poor hygiene, and systemic leukocytosis I would favor empiric treatment. Sepsis ruled out. -Received 1 dose of Rocephin in the ED.  Continue on admission, for a total course of 3 days. - Follow blood and urine cultures  Acute kidney injury.   Likely prerenal in the setting of decreased oral intake, especially  over the past 2 days.  He appears dry on exam however BUN/creatinine ratio does not quite fit a prerenal picture.  He he attributes to his incontinence over the past few weeks to weakness however we will rule out an obstructive process with renal ultrasound. - Follow-up renal ultrasound findings  Recent history of status post stenting of the mid basilar artery Residual deficits however right-sided weakness and dysarthria Social factors complicate his disposition. -No recurrent stroke or intracranial hemorrhage on MRI -Continue aspirin, Brilinta, and statin - Consult PT/OT -Transitions of care consulted; suspect that he will need ongoing rehabilitation at a skilled facility after discharge  Moderate protein calorie malnutrition -Dietitian consulted for assessment of caloric needs  Normocytic anemia.   Chronic and stable  Suspected chronic bronchitis Not formally diagnosed on his chart however he tells me that he has a chronic productive cough that is worse in the morning and notes that he has a long history of tobacco use.  Radiographic features including hyperinflation and diaphragm flattening suggestive of  obstructive process. - Encourage smoking cessation - Consider PFTs after discharge   Best practice:  CODE STATUS: Full DVT for prophylaxis: Lovenox Dispo: Admit patient to Inpatient with expected length of stay greater than 2 midnights.   Mitzi Hansen, MD Internal Medicine Resident PGY-3 [facility] Pager: [phone] 04/13/2021 8:04 AM

## 2021-04-13 NOTE — ED Notes (Signed)
Patient eating dinner.

## 2021-04-13 NOTE — ED Notes (Signed)
Patient transported to Ultrasound 

## 2021-04-14 DIAGNOSIS — N3000 Acute cystitis without hematuria: Secondary | ICD-10-CM | POA: Diagnosis not present

## 2021-04-14 LAB — BASIC METABOLIC PANEL
Anion gap: 9 (ref 5–15)
BUN: 26 mg/dL — ABNORMAL HIGH (ref 8–23)
CO2: 25 mmol/L (ref 22–32)
Calcium: 8.7 mg/dL — ABNORMAL LOW (ref 8.9–10.3)
Chloride: 107 mmol/L (ref 98–111)
Creatinine, Ser: 1.31 mg/dL — ABNORMAL HIGH (ref 0.61–1.24)
GFR, Estimated: 59 mL/min — ABNORMAL LOW (ref >60)
Glucose, Bld: 104 mg/dL — ABNORMAL HIGH (ref 70–99)
Potassium: 3.6 mmol/L (ref 3.5–5.1)
Sodium: 141 mmol/L (ref 135–145)

## 2021-04-14 LAB — CBC
HCT: 32.1 % — ABNORMAL LOW (ref 39.0–52.0)
Hemoglobin: 10.2 g/dL — ABNORMAL LOW (ref 13.0–17.0)
MCH: 29.2 pg (ref 26.0–34.0)
MCHC: 31.8 g/dL (ref 30.0–36.0)
MCV: 92 fL (ref 80.0–100.0)
Platelets: 226 10*3/uL (ref 150–400)
RBC: 3.49 MIL/uL — ABNORMAL LOW (ref 4.22–5.81)
RDW: 13.3 % (ref 11.5–15.5)
WBC: 15.8 10*3/uL — ABNORMAL HIGH (ref 4.0–10.5)
nRBC: 0 % (ref 0.0–0.2)

## 2021-04-14 NOTE — Evaluation (Signed)
Occupational Therapy Evaluation Patient Details Name: Andre [name] MRN: [id number] DOB: 1950-08-11 Today's Date: 04/14/2021   History of Present Illness 70 y/o male presented to ED about 1 wk after d/c from [place] with weakness and inability to ambulate as well as having not taken his meds because his roommate could not get them. Pt fell at home and hit his head.  PMH: HTN, prior stroke with residual R weakness, ETOH abuse. Most recently pt had R sided weakness 03/15/21 and underwent thrombectomy of mid basilar artery with infarct in brainstem and R occipital cortex.   Clinical Impression   Pt PTA: per chart living with a roommate who is reportedly not assisting pt as expected. Pt was requiring assist with medical management and personal ADL/iADL. Pt currently, very low tone voice making very few verbalizations. Pt modA for bed mobility; minA for sit to stand with RW taking shuffled steps to recliner. Pt set-upA to maxA for ADL. Pt appears incontinent at this time. Pt would benefit from continued OT skilled services. OT following acutely.     Recommendations for follow up therapy are one component of a multi-disciplinary discharge planning process, led by the attending physician.  Recommendations may be updated based on patient status, additional functional criteria and insurance authorization.   Follow Up Recommendations  Skilled nursing-short term rehab (<3 hours/day)    Assistance Recommended at Discharge Frequent or constant Supervision/Assistance  Functional Status Assessment  Patient has had a recent decline in their functional status and demonstrates the ability to make significant improvements in function in a reasonable and predictable amount of time.  Equipment Recommendations  BSC/3in1    Recommendations for Other Services       Precautions / Restrictions Precautions Precautions: Fall Restrictions Weight Bearing Restrictions: No      Mobility Bed  Mobility Overal bed mobility: Needs Assistance Bed Mobility: Supine to Sit;Sit to Supine     Supine to sit: Mod assist Sit to supine: Mod assist   General bed mobility comments: pt on stretcher in ED, mod A and use of rail to come to sitting. Pt unable to lift LE's back into bed for return to supine, mod A for this and for positioning    Transfers Overall transfer level: Needs assistance Equipment used: Rolling walker (2 wheels) Transfers: Sit to/from Stand Sit to Stand: Mod assist           General transfer comment: mod A for power up      Balance Overall balance assessment: Needs assistance;History of Falls Sitting-balance support: Feet supported;Bilateral upper extremity supported Sitting balance-[name]: Fair     Standing balance support: During functional activity;Bilateral upper extremity supported Standing balance-[name]: Poor Standing balance comment: heavy reliance on RW and needed external support                           ADL either performed or assessed with clinical judgement   ADL Overall ADL's : Needs assistance/impaired Eating/Feeding: Set up;Sitting   Grooming: Set up;Sitting   Upper Body Bathing: Minimal assistance;Sitting   Lower Body Bathing: Sitting/lateral leans;Sit to/from stand;Cueing for safety;Cueing for sequencing;Maximal assistance   Upper Body Dressing : Minimal assistance;Sitting   Lower Body Dressing: Cueing for safety;Cueing for sequencing;Sitting/lateral leans;Sit to/from stand;Maximal assistance   Toilet Transfer: Minimal assistance;Stand-pivot;Cueing for sequencing;Cueing for safety;BSC/3in1;Rolling walker (2 wheels) Toilet Transfer Details (indicate cue type and reason): simulated to recliner Toileting- Clothing Manipulation and Hygiene: Maximal assistance;Sitting/lateral lean;Sit to/from stand;Cueing  for safety;Cueing for sequencing       Functional mobility during ADLs: Minimal assistance;Rolling walker  (2 wheels) General ADL Comments: Pt taking shuffled steps to recliner ~3 feet with RW.     Vision Baseline Vision/History: 0 No visual deficits Ability to See in Adequate Light: 0 Adequate Patient Visual Report: No change from baseline Vision Assessment?: No apparent visual deficits     Perception     Praxis      Pertinent Vitals/Pain Pain Assessment: No/denies pain     Hand Dominance Right   Extremity/Trunk Assessment Upper Extremity Assessment Upper Extremity Assessment: Generalized weakness   Lower Extremity Assessment Lower Extremity Assessment: Generalized weakness   Cervical / Trunk Assessment Cervical / Trunk Assessment: Kyphotic   Communication Communication Communication: Other (comment) (low voice)   Cognition Arousal/Alertness: Awake/alert Behavior During Therapy: Flat affect Overall Cognitive Status: No family/caregiver present to determine baseline cognitive functioning                                 General Comments: has difficulty giving detailed history     General Comments  Vitals stable; very little verbalizations made. Sitting up in recliner upon leaving, chair alarm on and green light blinking.    Exercises     Shoulder Instructions      Home Living Family/patient expects to be discharged to:: Skilled nursing facility Living Arrangements: Other (Comment) (rommate; reports that she is not helpful)                               Additional Comments: lives with roommate felicia      Prior Functioning/Environment Prior Level of Function : Independent/Modified Independent             Mobility Comments: walks with cane, uses public transportation prior to last admission          OT Problem List: Decreased strength;Decreased activity tolerance;Impaired balance (sitting and/or standing);Decreased cognition;Decreased safety awareness;Pain;Impaired UE functional use;Cardiopulmonary status limiting activity       OT Treatment/Interventions: Self-care/ADL training;Therapeutic exercise;Energy conservation;Therapeutic activities;Cognitive remediation/compensation;Patient/family education;Balance training    OT Goals(Current goals can be found in the care plan section) Acute Rehab OT Goals Patient Stated Goal: to go to rehab facility OT Goal Formulation: With patient Time For Goal Achievement: 04/28/21 Potential to Achieve Goals: Good ADL Goals Additional ADL Goal #1: Pt will perform OOB ADL x6 mins with intermittent standing with minA overall. Additional ADL Goal #2: pt will follow (2) 3 step commands with 75% accuracy.  OT Frequency: Min 2X/week   Barriers to D/C: Decreased caregiver support          Co-evaluation              AM-PAC OT "6 Clicks" Daily Activity     Outcome Measure Help from another person eating meals?: None Help from another person taking care of personal grooming?: A Little Help from another person toileting, which includes using toliet, bedpan, or urinal?: A Lot Help from another person bathing (including washing, rinsing, drying)?: A Lot Help from another person to put on and taking off regular upper body clothing?: A Little Help from another person to put on and taking off regular lower body clothing?: A Lot 6 Click Score: 16   End of Session Equipment Utilized During Treatment: Gait belt;Rolling walker (2 wheels) Nurse Communication: Mobility status  Activity Tolerance: Patient  tolerated treatment well;Patient limited by lethargy Patient left: in chair;with call bell/phone within reach;with chair alarm set  OT Visit Diagnosis: Unsteadiness on feet (R26.81);Muscle weakness (generalized) (M62.81);Pain;Other symptoms and signs involving cognitive function Pain - part of body:  (generalized)                Time: 0802-2336 OT Time Calculation (min): 23 min Charges:  OT General Charges $OT Visit: 1 Visit OT Evaluation $OT Eval Moderate Complexity: 1 Mod OT  Treatments $Self Care/Home Management : 8-22 mins  Jefferey Pica, OTR/L Acute Rehabilitation Services Pager: [phone] Office: [place] 04/14/2021, 1:48 PM

## 2021-04-14 NOTE — Hospital Course (Addendum)
Doing well today. Finished breakfast. Would like to stand up and walk around more today.

## 2021-04-14 NOTE — Progress Notes (Addendum)
HD#1 SUBJECTIVE:  Patient Summary: Andre [name] is a 70 y.o. 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 Coli bacteremia.   Overnight Events: Febrile to 103. Blood cultures positive x2 for E. Coli.   Subjective: On interview this morning, the patient is found just having completed a move to his chair with the help of physical therapy. He is alert and in no acute distress. He denies fevers/chills. He denies focal pain and shortness of breath. He is updated on the care plan and is amenable to going back to SNF.     OBJECTIVE:  Vital Signs: Vitals:   04/13/21 2027 04/13/21 2050 04/14/21 0509 04/14/21 0833  BP:  109/66 135/75 114/70  Pulse:  80 86 83  Resp:  18 20 16   Temp: 98.8 F (37.1 C) 97.7 F (36.5 C) (!) 100.9 F (38.3 C) (!) 97.5 F (36.4 C)  TempSrc: Oral Oral Oral Oral  SpO2:  98% 100% 99%  Weight:  56 kg    Height:  5\' 10"  (1.778 m)       No intake or output data in the 24 hours ending 04/14/21 1005 Net IO Since Admission: 900 mL [04/14/21 1005]  Physical Exam: Physical Exam Vitals reviewed.  Constitutional:      Comments: Thin and frail appearing elderly male sitting in a chair  Cardiovascular:     Rate and Rhythm: Normal rate and regular rhythm.     Pulses: Normal pulses.     Heart sounds: No murmur heard. Pulmonary:     Effort: Pulmonary effort is normal.     Breath sounds: Normal breath sounds.  Abdominal:     General: Bowel sounds are normal.     Palpations: Abdomen is soft.     Tenderness: There is no abdominal tenderness.  Neurological:     Mental Status: He is alert.     Comments: 4/5 strength on the left, 3/5 strength on the right, fair grip strength    Patient Lines/Drains/Airways Status     Active Line/Drains/Airways     Name Placement date Placement time Site Days   Peripheral IV 03/15/21 20 G Anterior;Right Forearm 03/15/21   2128  Forearm  30   Peripheral IV 03/16/21 20 G Left Forearm 03/16/21  0220  Forearm  29   Peripheral IV 04/13/21 20 G 1" Anterior;Left;Upper Arm 04/13/21  0508  Arm  1   External Urinary Catheter 03/16/21  0430  --  29             ASSESSMENT/PLAN:  Assessment: Andre [name] is a 70 y.o. 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 Coli bacteremia.    Plan:  #UTI #E. Coli bacteremia Patient febrile overnight to Tmax of 103, has leukocytosis of 15.8. BP and HR reassuring. Blood cultures positive x2 for E. Coli. Rocephin increased from 1 g/24hrs to 2 g. This is certainly a urinary source given the patient's UA on admission and urine culture showing 100K GNR. This may represent a treatable cause of his new weakness. -Rocephin 2g daily (dose increased on 12/11) -F/u Ucx -F/u Bcx and susceptibilities -Daily CBC, monitor fever  #Weakness #Hx recent bilateral pontine and R PCA infarcts s/p stenting of mid-basilar artery Patient with pre-existing R sided deficits, presented  from home after recent discharge from a SNF. CTH negative for acute anomaly. PT is recommending SNF again.  -Continue ASA, statin, Brilinta -PT/OT  #AKI (resolved) Cr from 1.56 to 1.3 this AM. Baseline appears to be 1.1. Likely pre-renal.  -Daily BMP  #HTN -Continue amlodipine 10 mg daily   FEN/GI: regular diet, IVF as needed VTE: Lovenox Dispo: SNF Code Status: FULL  Signature: Corky Sox, MD PGY-1 Pager: [phone]  Please contact the on call pager after 5 pm and on weekends at [phone].

## 2021-04-14 NOTE — TOC Progression Note (Addendum)
Transition of Care [facility]) - Progression Note    Patient Details  Name: Andre [name] MRN: [id number] Date of Birth: 05-07-1950  Transition of Care [facility]) CM/SW Contact  Elliot Gurney [place], [place] Phone Number: [phone] 04/14/2021, 4:41 PM  Clinical Narrative:    Patient contacted to discuss discharge plan. Patient unsure if he will be returning to [facility]. Patient agreeable to going "where I can be taken care of" . Patient was living with roommates but states that they were not providing good care. VM messages left with patient's brother Habib Kise [phone] spoke with Print production planner at [facility] to discuss disposition. Per Shirlee Limerick, he left AMA and is not able to return at this time. Fl2 faxed out, transition of care to follow up with possible bed offers.  Transition of Care to continue to follow.  [address], LCSW Transition of Care [phone]    Expected Discharge Plan: Skilled Nursing Facility Barriers to Discharge: Continued Medical Work up  Expected Discharge Plan and Services Expected Discharge Plan: [place] In-house Referral: Clinical Social Work Discharge Planning Services: CM Consult   Living arrangements for the past 2 months: Single Family Home                                       Social Determinants of Health (SDOH) Interventions    Readmission Risk Interventions No flowsheet data found.

## 2021-04-14 NOTE — NC FL2 (Signed)
[place] LEVEL OF CARE SCREENING TOOL     IDENTIFICATION  Patient Name: Andre [name] Birthdate: Jul 05, 1950 Sex: male Admission Date (Current Location): 04/12/2021  [facility] and [place] Number:  Herbalist and Address:  [facility] [place], [place] [address] [place], [place] [address]      Provider Number:  Zacarias Pontes)  Attending Physician Name and Address:  Axel Filler, *  Relative Name and Phone Number:  Josph Macho (brother) [phone]    Current Level of Care: Hospital Recommended Level of Care: [place] Prior Approval Number:    Date Approved/Denied:   PASRR Number: [id number]  Discharge Plan: SNF    Current Diagnoses: Patient Active Problem List   Diagnosis Date Noted   UTI (urinary tract infection) 04/13/2021   Protein-calorie malnutrition, moderate ([place]) 04/13/2021   Closed fracture of right distal radius 10/30/2020   Furuncle of face 08/07/2017   Localized swelling, mass or lump of neck 06/17/2017   Healthcare maintenance 09/06/2015   Essential hypertension 05/09/2015   History of CVA (cerebrovascular accident) 05/09/2015   Tobacco use disorder    HLD (hyperlipidemia)    Alcohol abuse    Polysubstance abuse ([place]) 03/31/2011    Orientation RESPIRATION BLADDER Height & Weight     Self, Time  Normal Incontinent, External catheter Weight: 123 lb 7.3 oz (56 kg) Height:  5\' 10"  (177.8 cm)  BEHAVIORAL SYMPTOMS/MOOD NEUROLOGICAL BOWEL NUTRITION STATUS      Continent Diet (see discharge summary)  AMBULATORY STATUS COMMUNICATION OF NEEDS Skin   Extensive Assist Verbally Other (Comment) (excoriated scrotum)                       Personal Care Assistance Level of Assistance  Bathing, Feeding, Dressing, Total care Bathing Assistance: Limited assistance Feeding assistance: Independent Dressing Assistance: Maximum assistance Total Care Assistance: Maximum assistance   Functional  Limitations Info  Sight, Hearing, Speech Sight Info: Adequate Hearing Info: Adequate Speech Info: Adequate    SPECIAL CARE FACTORS FREQUENCY  PT (By licensed PT), OT (By licensed OT)     PT Frequency: min 5x weekly OT Frequency: min 5x weekly            Contractures Contractures Info: Not present    Additional Factors Info  Code Status, Allergies Code Status Info: full Allergies Info: No Known Allergies           Current Medications (04/14/2021):  This is the current hospital active medication list Current Facility-Administered Medications  Medication Dose Route Frequency Provider Last Rate Last Admin   acetaminophen (TYLENOL) tablet 650 mg  650 mg Oral Q6H PRN Darrick Meigs, Rylee, MD   650 mg at 04/13/21 1651   Or   acetaminophen (TYLENOL) suppository 650 mg  650 mg Rectal Q6H PRN Christian, Rylee, MD       amLODipine (NORVASC) tablet 10 mg  10 mg Oral Daily Christian, Rylee, MD   10 mg at 04/14/21 7412   aspirin chewable tablet 81 mg  81 mg Oral Daily Christian, Rylee, MD   81 mg at 04/14/21 0904   atorvastatin (LIPITOR) tablet 80 mg  80 mg Oral Daily Christian, Rylee, MD   80 mg at 04/14/21 0904   cefTRIAXone (ROCEPHIN) 2 g in sodium chloride 0.9 % 100 mL IVPB  2 g Intravenous Q24H Kris Mouton, RPH 200 mL/hr at 04/14/21 0910 2 g at 04/14/21 0910   enoxaparin (LOVENOX) injection 40 mg  40  mg Subcutaneous Q24H Christian, Rylee, MD   40 mg at 30/16/01 0932   folic acid (FOLVITE) tablet 1 mg  1 mg Oral Daily Christian, Rylee, MD   1 mg at 04/14/21 3557   multivitamin (PROSIGHT) tablet 1 tablet  1 tablet Oral Daily Mitzi Hansen, MD   1 tablet at 04/14/21 3220   thiamine tablet 100 mg  100 mg Oral Daily Christian, Rylee, MD   100 mg at 04/14/21 2542   ticagrelor (BRILINTA) tablet 90 mg  90 mg Oral BID Mitzi Hansen, MD   90 mg at 04/14/21 7062     Discharge Medications: Please see discharge summary for a list of discharge medications.  Relevant Imaging  Results:  Relevant Lab Results:   Additional Information SSN: [id number]  Alberteen Sam, LCSW

## 2021-04-14 NOTE — Plan of Care (Signed)

## 2021-04-14 NOTE — Plan of Care (Signed)

## 2021-04-15 LAB — BASIC METABOLIC PANEL
Anion gap: 8 (ref 5–15)
BUN: 20 mg/dL (ref 8–23)
CO2: 24 mmol/L (ref 22–32)
Calcium: 8.5 mg/dL — ABNORMAL LOW (ref 8.9–10.3)
Chloride: 106 mmol/L (ref 98–111)
Creatinine, Ser: 1.14 mg/dL (ref 0.61–1.24)
GFR, Estimated: >60 mL/min (ref >60)
Glucose, Bld: 78 mg/dL (ref 70–99)
Potassium: 3.9 mmol/L (ref 3.5–5.1)
Sodium: 138 mmol/L (ref 135–145)

## 2021-04-15 LAB — CULTURE, BLOOD (ROUTINE X 2): Special Requests: ADEQUATE

## 2021-04-15 LAB — URINE CULTURE: Culture: >=100000 — AB

## 2021-04-15 LAB — CBC
HCT: 29.5 % — ABNORMAL LOW (ref 39.0–52.0)
Hemoglobin: 9.2 g/dL — ABNORMAL LOW (ref 13.0–17.0)
MCH: 28.8 pg (ref 26.0–34.0)
MCHC: 31.2 g/dL (ref 30.0–36.0)
MCV: 92.2 fL (ref 80.0–100.0)
Platelets: 273 10*3/uL (ref 150–400)
RBC: 3.2 MIL/uL — ABNORMAL LOW (ref 4.22–5.81)
RDW: 13.2 % (ref 11.5–15.5)
WBC: 11 10*3/uL — ABNORMAL HIGH (ref 4.0–10.5)
nRBC: 0 % (ref 0.0–0.2)

## 2021-04-15 MED ORDER — ENSURE ENLIVE PO LIQD
237.0000 mL | Freq: Three times a day (TID) | ORAL | Status: DC
  Administered 2021-04-15 – 2021-04-19 (×9): 237 mL via ORAL

## 2021-04-15 MED ORDER — CEFADROXIL 500 MG PO CAPS
1000.0000 mg | ORAL_CAPSULE | Freq: Two times a day (BID) | ORAL | Status: DC
  Administered 2021-04-16 – 2021-04-19 (×7): 1000 mg via ORAL
  Filled 2021-04-15 (×8): qty 2

## 2021-04-15 NOTE — Plan of Care (Signed)

## 2021-04-15 NOTE — Plan of Care (Signed)

## 2021-04-15 NOTE — Progress Notes (Signed)
HD#2 SUBJECTIVE:  Patient Summary: Andre [name] is a 70 y.o. 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 Coli bacteremia.   Overnight Events: NAEON  Subjective: On interview this morning, the patient is asked about his stay at [facility]. He reports not leaving AMA, contrary to what SW has found. He has no complaints this morning.     OBJECTIVE:  Vital Signs: Vitals:   04/14/21 0833 04/14/21 1416 04/14/21 2105 04/15/21 0534  BP: 114/70 105/66 111/71 122/72  Pulse: 83 80 81 78  Resp: 16  15 16   Temp: (!) 97.5 F (36.4 C) 97.6 F (36.4 C) 98.5 F (36.9 C) 99.2 F (37.3 C)  TempSrc: Oral Oral    SpO2: 99% 100% 99% 100%  Weight:      Height:          Intake/Output Summary (Last 24 hours) at 04/15/2021 0807 Last data filed at 04/15/2021 0534 Gross per 24 hour  Intake 240 ml  Output 1425 ml  Net -1185 ml   Net IO Since Admission: -285 mL [04/15/21 0807]  Physical Exam: Physical Exam Vitals reviewed.  Constitutional:      Comments: Thin and frail appearing elderly male sitting in a chair  Cardiovascular:     Rate and Rhythm: Normal rate and regular rhythm.     Pulses: Normal pulses.     Heart sounds: No murmur heard. Pulmonary:     Effort: Pulmonary effort is normal.     Breath sounds: Normal breath sounds.  Abdominal:     General: Bowel sounds are normal.     Palpations: Abdomen is soft.     Tenderness: There is no abdominal tenderness.  Neurological:     Mental Status: He is alert.     Comments: 4/5 strength on the left, 3/5 strength on the right, fair grip strength    Patient Lines/Drains/Airways Status     Active Line/Drains/Airways     Name Placement date Placement time Site Days   Peripheral IV 03/15/21 20 G Anterior;Right Forearm 03/15/21  2128  Forearm  30   Peripheral IV 03/16/21 20 G Left Forearm 03/16/21  0220  Forearm  29    Peripheral IV 04/13/21 20 G 1" Anterior;Left;Upper Arm 04/13/21  0508  Arm  1   External Urinary Catheter 03/16/21  0430  --  29             ASSESSMENT/PLAN:  Assessment: Andre [name] is a 70 y.o. 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 Coli bacteremia.    Plan:  #UTI #Fever with leukocytosis #E. Coli bacteremia Blood cultures positive x2 for E. Coli. Rocephin increased from 1 g/24hrs to 2 g. This is certainly a urinary source given the patient's UA on admission and urine culture showing 100K GNR. This may represent a treatable cause of his new weakness. Patient now afebrile, leukocytosis almost resolved, from 15 to 11. Appears well clinically. Urine sensitivities showing E. Coli resistance to Bactrim and ampicillin, blood cultures are essentially the same.  -Rocephin 2g daily (dose increased on 12/11) -Anticipate switch to cefadroxil soon, for 7 day course (-12/18) -Daily CBC, monitor fever  #Weakness #Hx recent bilateral pontine and R PCA infarcts s/p stenting of mid-basilar artery Patient with pre-existing R  sided deficits, presented from home after recent discharge from a SNF. CTH negative for acute anomaly. PT is recommending SNF again.  -Continue ASA, statin, Brilinta -PT/OT  #AKI (resolved) Cr from 1.56 to 1.3 this AM. Baseline appears to be 1.1. Likely pre-renal.  -Daily BMP  #HTN -Continue amlodipine 10 mg daily   FEN/GI: regular diet, IVF as needed VTE: Lovenox Dispo: SNF, not yet medically stable for D/C Code Status: FULL  Signature: Andre Sox, MD PGY-1 Pager: [phone]  Please contact the on call pager after 5 pm and on weekends at [phone].

## 2021-04-15 NOTE — Progress Notes (Signed)
Initial Nutrition Assessment  DOCUMENTATION CODES:   Non-severe (moderate) malnutrition in context of chronic illness, Underweight  INTERVENTION:  - Encourage PO intake  - Ensure Enlive po TID, each supplement provides 350 kcal and 20 grams of protein  - Continue MVI daily  NUTRITION DIAGNOSIS:   Severe Malnutrition related to chronic illness as evidenced by percent weight loss.  GOAL:   Patient will meet greater than or equal to 90% of their needs  MONITOR:   PO intake, Supplement acceptance, Labs, Weight trends  REASON FOR ASSESSMENT:   Consult Assessment of nutrition requirement/status  ASSESSMENT:   Pt with previous admission 03/15/21-03/20/21 for stroke d/t basilar artery LVO s/p stenting. He was d/c from [place] on 04/05/21 and has since had functional decline secondary to UTI and E. Coli bacteremia. PMH includes HTN, COVID positive 12/2019, polysubstance abuse, and stroke.  Pt states that when he was at [facility] after his last hospital admission, he was eating 3 meals per day and endorses eating "almost all of it." He reports having a decreased appetite for about 1 week since being home d/t weakness and inability to get up however, his appetite is starting to improve and is eating most of his meals during this admission. He denies difficulty chewing/swallowing and is agreeable to receiving Ensure during admission. Spoke with his RN who states that he is receiving house trays d/t pt request.   Pt unsure of current weight or weight loss. Obtained bed weight during visit. Weight noted to be 128 lbs. During last admission 03/15/21 pt weight noted to be 160 lbs. This is a weight loss of 20% in 1 month which is significant for time frame.   Medications: duricef, folvite, MVI, thiamine  Labs: reviewed UOP: 1472mL x24 hours I/O's: -841mL since admission  NUTRITION - FOCUSED PHYSICAL EXAM:  Flowsheet Row Most Recent Value  Orbital Region Moderate depletion   Upper Arm Region Severe depletion  Thoracic and Lumbar Region Severe depletion  Buccal Region Severe depletion  [place] Region Severe depletion  Clavicle Bone Region Severe depletion  Clavicle and Acromion Bone Region Severe depletion  Scapular Bone Region Severe depletion  Dorsal Hand Severe depletion  Patellar Region Severe depletion  Anterior Thigh Region Severe depletion  Edema (RD Assessment) None  Hair Reviewed  Eyes Reviewed  Mouth Reviewed  Skin Reviewed  Nails Reviewed       Diet Order:   Diet Order             Diet regular Room service appropriate? Yes; Fluid consistency: Thin  Diet effective now                   EDUCATION NEEDS:   Education needs have been addressed  Skin:  Skin Assessment: Reviewed RN Assessment  Last BM:  04/13/21  Height:   Ht Readings from Last 1 Encounters:  04/13/21 5\' 10"  (1.778 m)    Weight:   Wt Readings from Last 1 Encounters:  04/15/21 58.1 kg    BMI:  Body mass index is 18.38 kg/m.  Estimated Nutritional Needs:   Kcal:  1800-2000  Protein:  90-100g  Fluid:  >/=1.8L  Clayborne Dana, RDN, LDN Clinical Nutrition

## 2021-04-16 DIAGNOSIS — E440 Moderate protein-calorie malnutrition: Secondary | ICD-10-CM

## 2021-04-16 DIAGNOSIS — I10 Essential (primary) hypertension: Secondary | ICD-10-CM

## 2021-04-16 LAB — CBC
HCT: 30.5 % — ABNORMAL LOW (ref 39.0–52.0)
Hemoglobin: 9.9 g/dL — ABNORMAL LOW (ref 13.0–17.0)
MCH: 29.5 pg (ref 26.0–34.0)
MCHC: 32.5 g/dL (ref 30.0–36.0)
MCV: 90.8 fL (ref 80.0–100.0)
Platelets: 307 10*3/uL (ref 150–400)
RBC: 3.36 MIL/uL — ABNORMAL LOW (ref 4.22–5.81)
RDW: 12.9 % (ref 11.5–15.5)
WBC: 8.6 10*3/uL (ref 4.0–10.5)
nRBC: 0 % (ref 0.0–0.2)

## 2021-04-16 LAB — BASIC METABOLIC PANEL
Anion gap: 8 (ref 5–15)
BUN: 22 mg/dL (ref 8–23)
CO2: 25 mmol/L (ref 22–32)
Calcium: 8.8 mg/dL — ABNORMAL LOW (ref 8.9–10.3)
Chloride: 105 mmol/L (ref 98–111)
Creatinine, Ser: 1.09 mg/dL (ref 0.61–1.24)
GFR, Estimated: >60 mL/min (ref >60)
Glucose, Bld: 84 mg/dL (ref 70–99)
Potassium: 3.8 mmol/L (ref 3.5–5.1)
Sodium: 138 mmol/L (ref 135–145)

## 2021-04-16 NOTE — Plan of Care (Signed)

## 2021-04-16 NOTE — Care Management Important Message (Signed)
Important Message  Patient Details  Name: Andre [name] MRN: [id number] Date of Birth: 09/29/1950   Medicare Important Message Given:  Yes     Hannah Beat 04/16/2021, 1:47 PM

## 2021-04-16 NOTE — Progress Notes (Signed)
HD#3 SUBJECTIVE:  Patient Summary: Andre [name] is a 70 y.o. 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 Coli bacteremia.   Overnight Events: NAEON  Subjective: On interview this morning, the patient is found lying comfortably in bed. He has no complaints and denies any physical pain.   OBJECTIVE:  Vital Signs: Vitals:   04/15/21 0534 04/15/21 0837 04/15/21 1600 04/15/21 2023  BP: 122/72 121/75  122/77  Pulse: 78 74  75  Resp: 16 16  16   Temp: 99.2 F (37.3 C) (!) 97.1 F (36.2 C)  98.5 F (36.9 C)  TempSrc:    Oral  SpO2: 100% 98%  99%  Weight:   58.1 kg   Height:          Intake/Output Summary (Last 24 hours) at 04/16/2021 0737 Last data filed at 04/15/2021 2201 Gross per 24 hour  Intake 240 ml  Output 600 ml  Net -360 ml    Net IO Since Admission: -645 mL [04/16/21 0737]  Physical Exam: Physical Exam Vitals reviewed.  Constitutional:      Comments: Thin and frail appearing elderly male sitting in a chair  Cardiovascular:     Rate and Rhythm: Normal rate and regular rhythm.     Pulses: Normal pulses.     Heart sounds: No murmur heard. Pulmonary:     Effort: Pulmonary effort is normal.     Breath sounds: Normal breath sounds.  Abdominal:     General: Bowel sounds are normal.     Palpations: Abdomen is soft.     Tenderness: There is no abdominal tenderness.  Neurological:     Mental Status: He is alert.    Patient Lines/Drains/Airways Status     Active Line/Drains/Airways     Name Placement date Placement time Site Days   Peripheral IV 03/15/21 20 G Anterior;Right Forearm 03/15/21  2128  Forearm  30   Peripheral IV 03/16/21 20 G Left Forearm 03/16/21  0220  Forearm  29   Peripheral IV 04/13/21 20 G 1" Anterior;Left;Upper Arm 04/13/21  0508  Arm  1   External Urinary Catheter 03/16/21  0430  --  29             ASSESSMENT/PLAN:   Assessment: Andre [name] is a 70 y.o. 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 Coli bacteremia.    Plan:  #UTI #Fever with leukocytosis #E. Coli bacteremia Blood cultures positive x2 for E. Coli. Rocephin increased from 1 g/24hrs to 2 g. This is certainly a urinary source given the patient's UA on admission and urine culture showing 100K GNR. This may represent a treatable cause of his new weakness. Patient now afebrile, leukocytosis resolved. Appears well clinically. Urine sensitivities showing E. Coli resistance to Bactrim and ampicillin, blood cultures are essentially the same.  -Cefadroxil for 7 day course of antibiotics (12/11-12/18) -Daily CBC, monitor fever  #Weakness #Hx recent bilateral pontine and R PCA infarcts s/p stenting of mid-basilar artery Patient with pre-existing R sided deficits, presented from home after recent discharge from a SNF. CTH negative for acute anomaly. PT is recommending SNF again.  -Continue ASA, statin, Brilinta -PT/OT  #AKI (resolved) Cr from 1.56 on admission to 1.1. Baseline appears to be 1.1. Likely pre-renal.  -  Daily BMP  #HTN -Continue amlodipine 10 mg daily  FEN/GI: regular diet, IVF as needed VTE: Lovenox Dispo: Medically stable for discharge to SNF Code Status: FULL  Signature: Corky Sox, MD PGY-1 Pager: [phone]  Please contact the on call pager after 5 pm and on weekends at [phone].

## 2021-04-16 NOTE — Progress Notes (Addendum)
Physical Therapy Treatment Patient Details Name: Andre [name] MRN: [id number] DOB: Sep 19, 1950 Today's Date: 04/16/2021   History of Present Illness 70 y/o male presented to ED about 1 wk after d/c from [place] with weakness and inability to ambulate as well as having not taken his meds because his roommate could not get them. Pt fell at home and hit his head. Found to have UTI and E Coli bacteremia. PMH: HTN, prior stroke with residual R weakness, ETOH abuse. Most recently pt had R sided weakness 03/15/21 and underwent thrombectomy of mid basilar artery with infarct in brainstem and R occipital cortex.    PT Comments    Patient progressing well towards PT goals. Session focused on gait training with use of RW and Min A for support. Noted to have an ataxic like gait with left knee hyperextension thrust during stance and increased knee flexion RLE worsened when fatigued. Pt oriented x3 but thinks he is here because of a stroke.Pt is a fall risk and continues to be appropriate for SNF. Will follow.   Recommendations for follow up therapy are one component of a multi-disciplinary discharge planning process, led by the attending physician.  Recommendations may be updated based on patient status, additional functional criteria and insurance authorization.  Follow Up Recommendations  Skilled nursing-short term rehab (<3 hours/day)     Assistance Recommended at Discharge Frequent or constant Supervision/Assistance  Equipment Recommendations  None recommended by PT    Recommendations for Other Services       Precautions / Restrictions Precautions Precautions: Fall Restrictions Weight Bearing Restrictions: No     Mobility  Bed Mobility Overal bed mobility: Needs Assistance Bed Mobility: Supine to Sit     Supine to sit: Supervision;HOB elevated     General bed mobility comments: No assist needed, use of rails for support.    Transfers Overall transfer level: Needs  assistance Equipment used: Rolling walker (2 wheels) Transfers: Sit to/from Stand Sit to Stand: Min assist           General transfer comment: Min A to steady in standing with cues for hand placement as pt pulling up on RW. Stood from [employer], from chair x1. Transferred to chair post ambulation    Ambulation/Gait Ambulation/Gait assistance: Min assist Gait Distance (Feet): 100 Feet (x2 bouts) Assistive device: Rolling walker (2 wheels) Gait Pattern/deviations: Step-through pattern;Ataxic;Knee hyperextension - left   Gait velocity interpretation: 1.31 - 2.62 ft/sec, indicative of limited community ambulator   General Gait Details: Unsteady ataxic like gait with bil knee instability, left knee hyperextension thrust during stance phase of gait; noted to have increased knee flexion bilaterally when fatigued. 1 seated rest break.   Stairs             Wheelchair Mobility    Modified Rankin (Stroke Patients Only)       Balance Overall balance assessment: Needs assistance;History of Falls Sitting-balance support: Feet supported;No upper extremity supported Sitting balance-[name]: Good     Standing balance support: During functional activity;Reliant on assistive device for balance Standing balance-[name]: Poor Standing balance comment: Requires external support for dynamic tasks.                            Cognition Arousal/Alertness: Awake/alert Behavior During Therapy: WFL for tasks assessed/performed Overall Cognitive Status: No family/caregiver present to determine baseline cognitive functioning  General Comments: Follows commands well, thinks he is here for a stroke        Exercises      General Comments        Pertinent Vitals/Pain Pain Assessment: No/denies pain    Home Living                          Prior Function            PT Goals (current goals can now be found  in the care plan section) Progress towards PT goals: Progressing toward goals    Frequency    Min 2X/week      PT Plan Current plan remains appropriate    Co-evaluation              AM-PAC PT "6 Clicks" Mobility   Outcome Measure  Help needed turning from your back to your side while in a flat bed without using bedrails?: A Little Help needed moving from lying on your back to sitting on the side of a flat bed without using bedrails?: A Little Help needed moving to and from a bed to a chair (including a wheelchair)?: A Little Help needed standing up from a chair using your arms (e.g., wheelchair or bedside chair)?: A Little Help needed to walk in hospital room?: A Little Help needed climbing 3-5 steps with a railing? : A Lot 6 Click Score: 17    End of Session Equipment Utilized During Treatment: Gait belt Activity Tolerance: Patient tolerated treatment well Patient left: in chair;with call bell/phone within reach;with chair alarm set Nurse Communication: Mobility status PT Visit Diagnosis: Unsteadiness on feet (R26.81);History of falling (Z91.81);Difficulty in walking, not elsewhere classified (R26.2);Muscle weakness (generalized) (M62.81)     Time: 1004-1020 PT Time Calculation (min) (ACUTE ONLY): 16 min  Charges:  $Gait Training: 8-22 mins                     Marisa Severin, PT, DPT Acute Rehabilitation Services Pager [phone] Office [place] 04/16/2021, 12:22 PM

## 2021-04-17 DIAGNOSIS — R531 Weakness: Secondary | ICD-10-CM

## 2021-04-17 LAB — CBC
HCT: 30.7 % — ABNORMAL LOW (ref 39.0–52.0)
Hemoglobin: 9.6 g/dL — ABNORMAL LOW (ref 13.0–17.0)
MCH: 28.9 pg (ref 26.0–34.0)
MCHC: 31.3 g/dL (ref 30.0–36.0)
MCV: 92.5 fL (ref 80.0–100.0)
Platelets: 349 10*3/uL (ref 150–400)
RBC: 3.32 MIL/uL — ABNORMAL LOW (ref 4.22–5.81)
RDW: 12.8 % (ref 11.5–15.5)
WBC: 7.1 10*3/uL (ref 4.0–10.5)
nRBC: 0 % (ref 0.0–0.2)

## 2021-04-17 LAB — BASIC METABOLIC PANEL
Anion gap: 5 (ref 5–15)
BUN: 29 mg/dL — ABNORMAL HIGH (ref 8–23)
CO2: 25 mmol/L (ref 22–32)
Calcium: 8.9 mg/dL (ref 8.9–10.3)
Chloride: 106 mmol/L (ref 98–111)
Creatinine, Ser: 1 mg/dL (ref 0.61–1.24)
GFR, Estimated: >60 mL/min (ref >60)
Glucose, Bld: 86 mg/dL (ref 70–99)
Potassium: 4.2 mmol/L (ref 3.5–5.1)
Sodium: 136 mmol/L (ref 135–145)

## 2021-04-17 NOTE — TOC Progression Note (Signed)
Transition of Care [facility]) - Progression Note    Patient Details  Name: Andre [name] MRN: [id number] Date of Birth: [birth year]  [facility]) CM/SW Contact  Joanne Chars, LCSW Phone Number: 04/17/2021, 2:16 PM  Clinical Narrative:    CSW met with pt, discussed SNF efforts to find a facility.  Pt reports he was at [facility] for less than a month, was told he had to leave once his insurance auth ran out.  He went to stay with two friends: Coralyn Mark and [name] [address] [place]  Pt was able find phone number for [facility]: [phone] (or possibly [phone])  Pt said he was too weak to walk to the bathroom, otherwise he could continue to live with terry and [name], but they are not able to care for him, he has to be independent.  Pt has two brothers in [place], [place] and [place]. Pt reports that they are not options for him to stay with "we don't associate like that."  Permission given to speak with [name] and with brothers.    The following efforts were made to locate SNF for this pt: [facility]: [facility] offer bed. [facility]: will review referral [facility]: will review referral [facility]: no beds  Search expanded in the hub.  8 facilities have declined this referral in the hub.      Expected Discharge Plan: [place] Barriers to Discharge: Continued Medical Work up  Expected Discharge Plan and Services Expected Discharge Plan: [place] In-house Referral: Clinical Social Work Discharge Planning Services: CM Consult   Living arrangements for the past 2 months: Single Family Home                                       Social Determinants of Health (SDOH) Interventions    Readmission Risk Interventions No flowsheet data found.

## 2021-04-17 NOTE — Progress Notes (Signed)
HD#4 SUBJECTIVE:  Patient Summary: Andre [name] is a 70 y.o. 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 Coli bacteremia.   Overnight Events: NAEON  Subjective: On interview this morning, the patient is found lying comfortably in bed. He reports wanting to get up and walk around today.  OBJECTIVE:  Vital Signs: Vitals:   04/16/21 2032 04/17/21 0818 04/17/21 1204 04/17/21 1634  BP: 109/72 116/76 106/69 98/68  Pulse: 79 70 72 74  Resp: 17 18 19 17   Temp: 98.3 F (36.8 C) 98.2 F (36.8 C) 98.2 F (36.8 C) 97.7 F (36.5 C)  TempSrc: Oral Oral Oral Oral  SpO2: 98% 92% 100% 99%  Weight:      Height:          Intake/Output Summary (Last 24 hours) at 04/17/2021 1726 Last data filed at 04/17/2021 1401 Gross per 24 hour  Intake --  Output 1450 ml  Net -1450 ml    Net IO Since Admission: -2,065 mL [04/17/21 1726]  Physical Exam: Physical Exam Vitals reviewed.  Constitutional:      Comments: Thin and frail appearing elderly male sitting in a chair  Cardiovascular:     Rate and Rhythm: Normal rate and regular rhythm.     Pulses: Normal pulses.     Heart sounds: No murmur heard. Pulmonary:     Effort: Pulmonary effort is normal.     Breath sounds: Normal breath sounds.  Abdominal:     General: Bowel sounds are normal.     Palpations: Abdomen is soft.     Tenderness: There is no abdominal tenderness.  Neurological:     Mental Status: He is alert.    Patient Lines/Drains/Airways Status     Active Line/Drains/Airways     Name Placement date Placement time Site Days   Peripheral IV 03/15/21 20 G Anterior;Right Forearm 03/15/21  2128  Forearm  30   Peripheral IV 03/16/21 20 G Left Forearm 03/16/21  0220  Forearm  29   Peripheral IV 04/13/21 20 G 1" Anterior;Left;Upper Arm 04/13/21  0508  Arm  1   External Urinary Catheter 03/16/21  0430  --  29              ASSESSMENT/PLAN:  Assessment: Andre [name] is a 70 y.o. 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 Coli bacteremia.    Plan:  #UTI #Fever with leukocytosis #E. Coli bacteremia Blood cultures positive x2 for E. Coli. Rocephin increased from 1 g/24hrs to 2 g. This is certainly a urinary source given the patient's UA on admission and urine culture showing 100K GNR. This may represent a treatable cause of his new weakness. Patient now afebrile, leukocytosis resolved. Appears well clinically. Urine sensitivities showing E. Coli resistance to Bactrim and ampicillin, blood cultures are essentially the same.  -Cefadroxil for 7 day course of antibiotics (12/11-12/18) -Daily CBC, monitor fever  #Weakness #Hx recent bilateral pontine and R PCA infarcts s/p stenting of mid-basilar artery Patient with pre-existing R sided deficits, presented from home after recent discharge from a SNF. CTH negative for acute anomaly. PT is recommending SNF again.  -Continue ASA, statin, Brilinta -PT/OT  #AKI (resolved) Cr from 1.56 on admission to 1.1. Baseline appears to be 1.1. Likely pre-renal.  -  Daily BMP  #HTN -Continue amlodipine 10 mg daily  FEN/GI: regular diet, IVF as needed VTE: Lovenox Dispo: Medically stable for discharge to SNF Code Status: FULL  Signature: Corky Sox, MD PGY-1 Pager: [phone]  Please contact the on call pager after 5 pm and on weekends at [phone].

## 2021-04-17 NOTE — Progress Notes (Signed)
Occupational Therapy Treatment Patient Details Name: Andre [name] MRN: [id number] DOB: 1950-11-21 Today's Date: 04/17/2021   History of present illness 70 y/o male presented to ED about 1 wk after d/c from [place] with weakness and inability to ambulate as well as having not taken his meds because his roommate could not get them. Pt fell at home and hit his head. Found to have UTI and E Coli bacteremia. PMH: HTN, prior stroke with residual R weakness, ETOH abuse. Most recently pt had R sided weakness 03/15/21 and underwent thrombectomy of mid basilar artery with infarct in brainstem and R occipital cortex.   OT comments  Pt eager to mobilize with therapy this session, currently set up - min A for ADLs, supervision for bed mobility, and min guard for transfers with RW. Pt required increased cuing to keep RW near body during toilet transfer. Pt able to follow 1-2 step commands without difficulty during session. Continues to be limited by impairments listed below. Will continue to follow in the acute setting, recommend SNF at d/c.   Recommendations for follow up therapy are one component of a multi-disciplinary discharge planning process, led by the attending physician.  Recommendations may be updated based on patient status, additional functional criteria and insurance authorization.    Follow Up Recommendations  Skilled nursing-short term rehab (<3 hours/day)    Assistance Recommended at Discharge Frequent or constant Supervision/Assistance  Equipment Recommendations  None recommended by OT;Other (comment)    Recommendations for Other Services      Precautions / Restrictions Precautions Precautions: Fall Restrictions Weight Bearing Restrictions: No       Mobility Bed Mobility Overal bed mobility: Needs Assistance Bed Mobility: Supine to Sit     Supine to sit: Supervision;HOB elevated Sit to supine: Supervision   General bed mobility comments: No assist needed, use  of rails for support.    Transfers Overall transfer level: Needs assistance Equipment used: Rolling walker (2 wheels) Transfers: Sit to/from Stand Sit to Stand: Min guard                 Balance Overall balance assessment: Needs assistance;History of Falls Sitting-balance support: Feet supported;No upper extremity supported Sitting balance-[name]: Good     Standing balance support: During functional activity;Reliant on assistive device for balance Standing balance-[name]: Poor Standing balance comment: Requires external support for dynamic tasks.                           ADL either performed or assessed with clinical judgement   ADL Overall ADL's : Needs assistance/impaired Eating/Feeding: Set up;Sitting   Grooming: Set up;Sitting Grooming Details (indicate cue type and reason): completed standing grooming task at sink         Upper Body Dressing : Minimal assistance;Sitting Upper Body Dressing Details (indicate cue type and reason): donned gown on backside     Toilet Transfer: Min guard;Ambulation;Regular Toilet;Rolling walker (2 wheels) Toilet Transfer Details (indicate cue type and reason): transferred household distance to bathroom Toileting- Clothing Manipulation and Hygiene: Minimal assistance;Sitting/lateral lean Toileting - Clothing Manipulation Details (indicate cue type and reason): completed clothing mgmt and pericare in sitting     Functional mobility during ADLs: Minimal assistance;Rolling walker (2 wheels) General ADL Comments: pt requires cues to keep RW near body    Extremity/Trunk Assessment Upper Extremity Assessment Upper Extremity Assessment: Generalized weakness   Lower Extremity Assessment Lower Extremity Assessment: Defer to PT evaluation  Vision   Vision Assessment?: No apparent visual deficits   Perception     Praxis      Cognition Arousal/Alertness: Awake/alert Behavior During Therapy: WFL for  tasks assessed/performed                                              Exercises     Shoulder Instructions       General Comments VSS throughout session    Pertinent Vitals/ Pain       Pain Assessment: No/denies pain  Home Living                                          Prior Functioning/Environment              Frequency  Min 2X/week        Progress Toward Goals  OT Goals(current goals can now be found in the care plan section)     Acute Rehab OT Goals Patient Stated Goal: none stated OT Goal Formulation: With patient Time For Goal Achievement: 04/28/21 Potential to Achieve Goals: Good ADL Goals Additional ADL Goal #1: Pt will perform OOB ADL x6 mins with intermittent standing with minA overall. Additional ADL Goal #2: pt will follow (2) 3 step commands with 75% accuracy.  Plan Discharge plan remains appropriate;Frequency remains appropriate    Co-evaluation                 AM-PAC OT "6 Clicks" Daily Activity     Outcome Measure   Help from another person eating meals?: None Help from another person taking care of personal grooming?: A Little Help from another person toileting, which includes using toliet, bedpan, or urinal?: A Lot Help from another person bathing (including washing, rinsing, drying)?: A Lot Help from another person to put on and taking off regular upper body clothing?: A Little Help from another person to put on and taking off regular lower body clothing?: A Lot 6 Click Score: 16    End of Session Equipment Utilized During Treatment: Gait belt;Rolling walker (2 wheels)  OT Visit Diagnosis: Unsteadiness on feet (R26.81);Muscle weakness (generalized) (M62.81);Other symptoms and signs involving cognitive function   Activity Tolerance Patient tolerated treatment well   Patient Left in bed;with call bell/phone within reach;with bed alarm set   Nurse Communication Mobility status;Other  (comment) (RN notified pt's catheter fell out)        Time: 2671-2458 OT Time Calculation (min): 25 min  Charges: OT General Charges $OT Visit: 1 Visit OT Treatments $Self Care/Home Management : 23-37 mins  Lynnda Child, OTD, OTR/L Acute Rehab [phone]   Kaylyn Lim 04/17/2021, 3:30 PM

## 2021-04-17 NOTE — Plan of Care (Signed)
°  Problem: Education: Goal: Knowledge of General Education information will improve Description: Including pain rating scale, medication(s)/side effects and non-pharmacologic comfort measures Outcome: Not Progressing   Problem: Health Behavior/Discharge Planning: Goal: Ability to manage health-related needs will improve Outcome: Not Progressing   Problem: Clinical Measurements: Goal: Ability to maintain clinical measurements within normal limits will improve Outcome: Not Progressing Goal: Will remain free from infection Outcome: Not Progressing Goal: Diagnostic test results will improve Outcome: Not Progressing Goal: Respiratory complications will improve Outcome: Not Progressing Goal: Cardiovascular complication will be avoided Outcome: Not Progressing   Problem: Nutrition: Goal: Adequate nutrition will be maintained Outcome: Not Progressing   Problem: Coping: Goal: Level of anxiety will decrease Outcome: Not Progressing   Problem: Elimination: Goal: Will not experience complications related to bowel motility Outcome: Not Progressing Goal: Will not experience complications related to urinary retention Outcome: Not Progressing   Problem: Pain Managment: Goal: General experience of comfort will improve Outcome: Not Progressing   Problem: Safety: Goal: Ability to remain free from injury will improve Outcome: Not Progressing   Problem: Skin Integrity: Goal: Risk for impaired skin integrity will decrease Outcome: Not Progressing   

## 2021-04-17 NOTE — Consult Note (Signed)
° °  [facility] Inpatient Consult   04/17/2021  KEYMON MCELROY [birth year] [id number]  [place] Organization [ACO] Patient: [employer]  Primary Care Provider:  Maudie Mercury, MD Is listed with the [facility] [patient not been active in past 2 years or more he said]   Patient screened for less than 30 days hospitalization readmission risk.  Review of patient's medical record reveals patient is being recommended for a skilled nursing facility level of care.    Plan:  Continue to follow progress and disposition to assess for post hospital care management needs.  Current, recommended post hospital needs are for a skilled nursing facility level of care.  For questions contact:   Natividad Brood, RN BSN [facility] Liaison  [phone] business mobile phone Toll free office [phone]  Fax number: [phone] [email or web address]

## 2021-04-17 NOTE — Plan of Care (Signed)
°  Problem: Education: Goal: Knowledge of General Education information will improve Description: Including pain rating scale, medication(s)/side effects and non-pharmacologic comfort measures Outcome: Not Progressing   Problem: Health Behavior/Discharge Planning: Goal: Ability to manage health-related needs will improve Outcome: Not Progressing   Problem: Clinical Measurements: Goal: Ability to maintain clinical measurements within normal limits will improve Outcome: Not Progressing Goal: Will remain free from infection Outcome: Not Progressing Goal: Diagnostic test results will improve Outcome: Not Progressing Goal: Respiratory complications will improve Outcome: Not Progressing Goal: Cardiovascular complication will be avoided Outcome: Not Progressing   Problem: Clinical Measurements: Goal: Diagnostic test results will improve Outcome: Not Progressing   Problem: Clinical Measurements: Goal: Respiratory complications will improve Outcome: Not Progressing   Problem: Nutrition: Goal: Adequate nutrition will be maintained Outcome: Not Progressing   Problem: Pain Managment: Goal: General experience of comfort will improve Outcome: Not Progressing   Problem: Skin Integrity: Goal: Risk for impaired skin integrity will decrease Outcome: Not Progressing

## 2021-04-18 LAB — RESP PANEL BY RT-PCR (FLU A&B, COVID) ARPGX2
Influenza A by PCR: NEGATIVE
Influenza B by PCR: NEGATIVE
SARS Coronavirus 2 by RT PCR: NEGATIVE

## 2021-04-18 NOTE — TOC Progression Note (Addendum)
Transition of Care [facility]) - Progression Note    Patient Details  Name: DASANI CREAR MRN: [id number] Date of Birth: 1950-08-02  [facility] CM/SW Contact  Joanne Chars, LCSW Phone Number: 04/18/2021, 8:35 AM  Clinical Narrative:   Pt received bed offer from [facility].  CSW discussed this option with pt and he accepts the offer.  CSW spoke with Jackelyn Poling at [facility] who has a bed available today.  0940Josem Kaufmann request submitted in [place].  1540: Auth still pending.  Debbie/Pelican, pt, and MD all informed.      Expected Discharge Plan: [place] Barriers to Discharge: Continued Medical Work up  Expected Discharge Plan and Services Expected Discharge Plan: [place] In-house Referral: Clinical Social Work Discharge Planning Services: CM Consult   Living arrangements for the past 2 months: Single Family Home                                       Social Determinants of Health (SDOH) Interventions    Readmission Risk Interventions No flowsheet data found.

## 2021-04-18 NOTE — Progress Notes (Addendum)
HD#5 SUBJECTIVE:  Patient Summary: Andre [name] is a 70 y.o. 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 Coli bacteremia.   Overnight Events: NAEON  Subjective: On interview this morning, the patient is found lying comfortably in bed. He mobilized well with PT per chart review. He denies any complaints. He is updated on the care plan.  OBJECTIVE:  Vital Signs: Vitals:   04/17/21 1634 04/17/21 1915 04/17/21 2049 04/18/21 0718  BP: 98/68  108/69 112/70  Pulse: 74  77 67  Resp: 17  17 16   Temp: 97.7 F (36.5 C)  97.6 F (36.4 C) 97.8 F (36.6 C)  TempSrc: Oral  Oral Oral  SpO2: 99% 97% 98% 100%  Weight:      Height:          Intake/Output Summary (Last 24 hours) at 04/18/2021 1447 Last data filed at 04/18/2021 0900 Gross per 24 hour  Intake 120 ml  Output 750 ml  Net -630 ml    Net IO Since Admission: -2,695 mL [04/18/21 1447]  Physical Exam: Physical Exam Vitals reviewed.  Constitutional:      Comments: Thin and frail appearing elderly male lying in bed  Cardiovascular:     Rate and Rhythm: Normal rate and regular rhythm.     Pulses: Normal pulses.     Heart sounds: No murmur heard. Pulmonary:     Effort: Pulmonary effort is normal.     Breath sounds: Normal breath sounds.  Abdominal:     General: Bowel sounds are normal.     Palpations: Abdomen is soft.     Tenderness: There is no abdominal tenderness.  Neurological:     Mental Status: He is alert.    Patient Lines/Drains/Airways Status     Active Line/Drains/Airways     Name Placement date Placement time Site Days   Peripheral IV 03/15/21 20 G Anterior;Right Forearm 03/15/21  2128  Forearm  30   Peripheral IV 03/16/21 20 G Left Forearm 03/16/21  0220  Forearm  29   Peripheral IV 04/13/21 20 G 1" Anterior;Left;Upper Arm 04/13/21  0508  Arm  1   External Urinary Catheter 03/16/21  0430   --  29             ASSESSMENT/PLAN:  Assessment: Andre [name] is a 70 y.o. 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 Coli bacteremia.    Plan:  #UTI #Fever with leukocytosis #E. Coli bacteremia Blood cultures positive x2 for E. Coli. Rocephin increased from 1 g/24hrs to 2 g. This is certainly a urinary source given the patient's UA on admission and urine culture showing 100K GNR. This may represent a treatable cause of his new weakness. Patient now afebrile, leukocytosis resolved. Appears well clinically. Urine sensitivities showing E. Coli resistance to Bactrim and ampicillin, blood cultures are essentially the same.  -Cefadroxil for 7 day course of antibiotics (12/11-12/18)  #Weakness #Hx recent bilateral pontine and R PCA infarcts s/p stenting of mid-basilar artery Patient with pre-existing R sided deficits, presented from home after recent discharge from a SNF. CTH negative for acute anomaly. PT is recommending SNF again.  -Continue ASA, statin, Brilinta -PT/OT  #AKI (resolved) Cr from 1.56 on admission to 1.1. Baseline appears to be 1.1. Likely  pre-renal.   #HTN -Continue amlodipine 10 mg daily  FEN/GI: regular diet, IVF as needed VTE: Lovenox Dispo: Medically stable for discharge to SNF Code Status: FULL  Signature: Corky Sox, MD PGY-1 Pager: [phone]  Please contact the on call pager after 5 pm and on weekends at [phone].

## 2021-04-18 NOTE — Progress Notes (Signed)
Mobility Specialist Progress Note   04/18/21 1400  Mobility  Activity Ambulated in hall  Level of Assistance Contact guard assist, steadying assist  Assistive Device Front wheel walker  Distance Ambulated (ft) 235 ft  Mobility Ambulated with assistance in hallway  Mobility Response Tolerated well  Mobility performed by Mobility specialist  $Mobility charge 1 Mobility   Received pt in bed having no complaints and agreeable to mobility. Pt asx throughout ambulation but has small bouts of weakness in LE. Returned back to bed w/ all needs met.   Holland Falling Mobility Specialist Phone Number [phone]

## 2021-04-18 NOTE — Progress Notes (Signed)
Occupational Therapy Treatment Patient Details Name: Andre [name] MRN: [id number] DOB: Mar 30, 1951 Today's Date: 04/18/2021   History of present illness 70 y/o male presented to ED about 1 wk after d/c from [place] with weakness and inability to ambulate as well as having not taken his meds because his roommate could not get them. Pt fell at home and hit his head. Found to have UTI and E Coli bacteremia. PMH: HTN, prior stroke with residual R weakness, ETOH abuse. Most recently pt had R sided weakness 03/15/21 and underwent thrombectomy of mid basilar artery with infarct in brainstem and R occipital cortex.   OT comments  Pt received after recently working with mobility tech, agreeable to seated ADL. Focus of session was UB dressing and cognition/task sequencing. Pt completed UB dressing min A, difficulty with fine motor aspects of task. Pt supervision for bed mobility, min guard for transfers. Pt able to recall and follow 3 step instruction with 100% accuracy x3, requires increased time and repeats instructions to self throughout task. Graded activity to 4 step instruction, pt able to follow with 50-75% accuracy. Pt requires increased time, and is able to sustain focus on task for ~10 minutes. Pt progressing well towards goals. Will continue to follow acutely, continue to recommend SNF at d/c.    Recommendations for follow up therapy are one component of a multi-disciplinary discharge planning process, led by the attending physician.  Recommendations may be updated based on patient status, additional functional criteria and insurance authorization.    Follow Up Recommendations  Skilled nursing-short term rehab (<3 hours/day)    Assistance Recommended at Discharge Intermittent Supervision/Assistance  Equipment Recommendations  None recommended by OT;Other (comment) (TBD at next venue of care)    Recommendations for Other Services      Precautions / Restrictions  Precautions Precautions: Fall Restrictions Weight Bearing Restrictions: No       Mobility Bed Mobility Overal bed mobility: Needs Assistance Bed Mobility: Supine to Sit     Supine to sit: Supervision;HOB elevated Sit to supine: Supervision   General bed mobility comments: No assist needed, use of rails for support, increased time    Transfers Overall transfer level: Needs assistance Equipment used: 1 person hand held assist Transfers: Sit to/from Stand Sit to Stand: Min guard           General transfer comment: performed for donning gown sitting EOB     Balance Overall balance assessment: Needs assistance;History of Falls Sitting-balance support: Feet supported;No upper extremity supported Sitting balance-[name]: Good     Standing balance support: During functional activity;Single extremity supported Standing balance-[name]: Poor Standing balance comment: Requires external support for dynamic tasks.                           ADL either performed or assessed with clinical judgement   ADL Overall ADL's : Needs assistance/impaired                 Upper Body Dressing : Minimal assistance;Sitting Upper Body Dressing Details (indicate cue type and reason): donned gown sitting EOB                        Extremity/Trunk Assessment Upper Extremity Assessment Upper Extremity Assessment: Overall WFL for tasks assessed   Lower Extremity Assessment Lower Extremity Assessment: Defer to PT evaluation        Vision   Vision Assessment?: No apparent visual deficits  Perception Perception Perception: Not tested   Praxis Praxis Praxis: Not tested    Cognition Arousal/Alertness: Awake/alert Behavior During Therapy: WFL for tasks assessed/performed Overall Cognitive Status: No family/caregiver present to determine baseline cognitive functioning                                 General Comments: Pt able to follow  3 step sequencing task 100% accuracy x 3 during session, decreased to 50-75% accuracy with 4 step instruction. pt requires increased time and minimal distractions to follow 3-step instruction. Pt benefits from repeating instructions to self when completing task.          Exercises     Shoulder Instructions       General Comments Pt able to participate in sequencing task, able to perform tasks with 1 step instruction without difficulty, pt requiring increased time to complete 3-step instruction, completes 3 step task with accuracy x3 during session    Pertinent Vitals/ Pain       Pain Assessment: No/denies pain  Home Living                                          Prior Functioning/Environment              Frequency  Min 2X/week        Progress Toward Goals  OT Goals(current goals can now be found in the care plan section)  Progress towards OT goals: Progressing toward goals  Acute Rehab OT Goals Patient Stated Goal: none stated OT Goal Formulation: With patient Time For Goal Achievement: 04/28/21 Potential to Achieve Goals: Good ADL Goals Additional ADL Goal #1: Pt will perform OOB ADL x6 mins with intermittent standing with minA overall. Additional ADL Goal #2: pt will follow (2) 3 step commands with 75% accuracy.  Plan Discharge plan remains appropriate;Frequency remains appropriate    Co-evaluation                 AM-PAC OT "6 Clicks" Daily Activity     Outcome Measure   Help from another person eating meals?: None Help from another person taking care of personal grooming?: A Little Help from another person toileting, which includes using toliet, bedpan, or urinal?: A Lot Help from another person bathing (including washing, rinsing, drying)?: A Lot Help from another person to put on and taking off regular upper body clothing?: A Little Help from another person to put on and taking off regular lower body clothing?: A Lot 6 Click  Score: 16    End of Session    OT Visit Diagnosis: Unsteadiness on feet (R26.81);Muscle weakness (generalized) (M62.81);Other symptoms and signs involving cognitive function   Activity Tolerance Patient tolerated treatment well   Patient Left in bed;with call bell/phone within reach;with bed alarm set   Nurse Communication Mobility status        Time: 1937-9024 OT Time Calculation (min): 19 min  Charges: OT General Charges $OT Visit: 1 Visit OT Treatments $Therapeutic Activity: 8-22 mins  Lynnda Child, OTD, OTR/L Acute Rehab [phone]) [phone] [place] 04/18/2021, 3:27 PM

## 2021-04-18 NOTE — Discharge Summary (Incomplete)
° °  Name: Andre [name] MRN: [id number] DOB: 03-21-1951 70 y.o. PCP: Maudie Mercury, MD  Date of Admission: 04/12/2021  3:54 PM Date of Discharge: No discharge date for patient encounter. Attending Physician: Lucious Groves, DO  Discharge Diagnosis: 1. UTI, E. Coli bacteremia (resolved) 2. AKI (resolved) 3. Hx CVA 4. HTN 5. Tobacco use disorder 6. Protein-calorie malnutrition  Discharge Medications: Allergies as of 04/18/2021   No Known Allergies   Med Rec must be completed prior to using this [facility]***       Disposition and follow-up:   Andre [name] was discharged from [facility] in Stable condition.  At the hospital follow up visit please address:  1.  Physical deconditioning, HTN, and tobacco use disorder.   2.  Labs / imaging needed at time of follow-up: NA  3.  Pending labs/ test needing follow-up: NA  Hospital Course by problem list: 1. UTI, E. Coli Bacteremia Patient presented for physical deconditioning and was found to have fever and leukocytosis to 16. Blood cultures positive x2 for E. Coli. Rocephin increased from 1 g/24hrs to 2 g. This was certainly a urinary source given the patient's UA on admission and urine culture showing 100K GNR. Patient quickly became afebrile, leukocytosis resolved. Appeared well clinically. Urine sensitivities showed E. Coli resistance to Bactrim and ampicillin, blood cultures were essentially the same. The patient was transitioned to oral cefadroxil for a 7 day course of antibiotics (set to end 12/18).   2. Hx CVA Patient with Hx of bilateral pontine and R PCA infarcts s/p stenting of the mid-basilar artery in nov of 2022. This left the patient with some R-sided weakness. He was at [facility]) but per SW report, left AMA and went home. His caregivers there did not take good care of him and he presented to the ED for physical deconditioning and weakness. CT head negative for new infarct.  Patient was continued on DAPT for stroke/stent placement. PT recommended SNF again and patient was D/C'd to [facility]*.   Discharge Exam:   BP 112/70 (BP Location: Left Arm)    Pulse 67    Temp 97.8 F (36.6 C) (Oral)    Resp 16    Ht 5\' 10"  (1.778 m)    Wt 58.1 kg    SpO2 100%    BMI 18.38 kg/m  Discharge exam: ***  Pertinent Labs, Studies, and Procedures:  ***  Discharge Instructions:   Signed: Corky Sox, MD 04/18/2021, 9:14 AM   Pager: @MYPAGER @

## 2021-04-18 NOTE — Plan of Care (Signed)

## 2021-04-19 ENCOUNTER — Other Ambulatory Visit (HOSPITAL_COMMUNITY): Payer: Self-pay

## 2021-04-19 MED ORDER — CEFADROXIL 500 MG PO CAPS
1000.0000 mg | ORAL_CAPSULE | Freq: Two times a day (BID) | ORAL | 0 refills | Status: AC
Start: 2021-04-19 — End: 2021-04-22
  Filled 2021-04-19: qty 12, 3d supply, fill #0

## 2021-04-19 NOTE — Plan of Care (Signed)

## 2021-04-19 NOTE — TOC Transition Note (Signed)
Transition of Care [facility]) - CM/SW Discharge Note   Patient Details  Name: Andre [name] MRN: [id number] Date of Birth: 1951-03-24  Transition of Care [facility] [place]) CM/SW Contact:  Coralee Pesa, [place] Phone Number: 04/19/2021, 11:55 AM   Clinical Narrative:    Pt to be transported to [facility] via [place] at [employer].  Nurse to call report to [phone].   Final next level of care: Skilled Nursing Facility Barriers to Discharge: Barriers Resolved   Patient Goals and CMS Choice Patient states their goals for this hospitalization and ongoing recovery are:: "I need to get straigtened out"      Discharge Placement              Patient chooses bed at:  [facility] ([place] Patient to be transferred to facility by: [place] Name of family member notified: Josph Macho Patient and family notified of of transfer: 04/19/21  Discharge Plan and Services In-house Referral: Clinical Social Work Discharge Planning Services: CM Consult                                 Social Determinants of Health (SDOH) Interventions     Readmission Risk Interventions No flowsheet data found.

## 2021-04-19 NOTE — Progress Notes (Signed)
Physical Therapy Treatment Patient Details Name: Andre [name] MRN: [id number] DOB: 08/06/1950 Today's Date: 04/19/2021   History of Present Illness 70 y/o male presented to ED about 1 wk after d/c from [place] with weakness and inability to ambulate as well as having not taken his meds because his roommate could not get them. Pt fell at home and hit his head. Found to have UTI and E Coli bacteremia. PMH: HTN, prior stroke with residual R weakness, ETOH abuse. Most recently pt had R sided weakness 03/15/21 and underwent thrombectomy of mid basilar artery with infarct in brainstem and R occipital cortex.    PT Comments    Treatment limited today as pt just ambulated around [place] floor with mobility specialist.  Too tired to amb again at this time, but agreeable to therex in chair.  Demos good tolerance to therex and is able to complete activity with independence.  Demos fair coordination.  Pt is left sitting up in chair with needs in reach, states he may D/C today.   Recommendations for follow up therapy are one component of a multi-disciplinary discharge planning process, led by the attending physician.  Recommendations may be updated based on patient status, additional functional criteria and insurance authorization.  Follow Up Recommendations  Skilled nursing-short term rehab (<3 hours/day)     Assistance Recommended at Discharge Intermittent Supervision/Assistance  Equipment Recommendations  Rolling walker (2 wheels)    Recommendations for Other Services       Precautions / Restrictions Precautions Precautions: Fall Restrictions Weight Bearing Restrictions: No     Mobility  Bed Mobility                    Transfers                        Ambulation/Gait                   Stairs             Wheelchair Mobility    Modified Rankin (Stroke Patients Only)       Balance                                             Cognition Arousal/Alertness: Awake/alert Behavior During Therapy: WFL for tasks assessed/performed Overall Cognitive Status: Within Functional Limits for tasks assessed                                 General Comments: Pt. sitting up in chair when PT arrives.  States he just walked around floor with mobility and does not want to walk again at this time.  Agreeable to therex in chair.        Exercises General Exercises - Lower Extremity Ankle Circles/Pumps: AROM;Both;20 reps Heel Slides: AROM;Both;20 reps Hip ABduction/ADduction: AROM;Both;20 reps Straight Leg Raises: AROM;Both;20 reps    General Comments        Pertinent Vitals/Pain Pain Assessment: 0-10 Pain Score: 0-No pain    Home Living                          Prior Function            PT Goals (current goals can now be found in the  care plan section) Progress towards PT goals: Progressing toward goals    Frequency    Min 2X/week      PT Plan Current plan remains appropriate    Co-evaluation              AM-PAC PT "6 Clicks" Mobility   Outcome Measure  Help needed turning from your back to your side while in a flat bed without using bedrails?: A Little Help needed moving from lying on your back to sitting on the side of a flat bed without using bedrails?: A Little Help needed moving to and from a bed to a chair (including a wheelchair)?: A Little Help needed standing up from a chair using your arms (e.g., wheelchair or bedside chair)?: A Little Help needed to walk in hospital room?: A Little Help needed climbing 3-5 steps with a railing? : A Little 6 Click Score: 18    End of Session   Activity Tolerance: Patient tolerated treatment well Patient left: in chair;with chair alarm set;with call bell/phone within reach         Time: 1139-1147 PT Time Calculation (min) (ACUTE ONLY): 8 min  Charges:  $Therapeutic Exercise: 8-22 mins                    Cedricka Sackrider A.  Taylin Leder, PT, DPT Acute Rehabilitation Services Office: [place] 04/19/2021, 11:50 AM

## 2021-04-19 NOTE — Discharge Summary (Signed)
Name: Andre [name] MRN: [id number] DOB: Jul 11, 1950 70 y.o. PCP: Maudie Mercury, MD  Date of Admission: 04/12/2021  3:54 PM Date of Discharge: 04/19/2021 Attending Physician: Aldine Contes, MD  Discharge Diagnosis: 1. UTI with E. Coli bacteremia (resolved) 2. AKI (resolved) 3. Hx CVA 4. HTN 5. Tobacco use disorder 6. Protein-calorie malnutrition  Discharge Medications: Allergies as of 04/19/2021   No Known Allergies      Medication List     TAKE these medications    amLODipine 10 MG tablet Commonly known as: NORVASC Take 1 tablet (10 mg total) by mouth daily.   aspirin 81 MG chewable tablet Chew 1 tablet (81 mg total) by mouth daily.   atorvastatin 80 MG tablet Commonly known as: LIPITOR Take 1 tablet (80 mg total) by mouth daily.   cefadroxil 500 MG capsule Commonly known as: DURICEF Take 2 capsules (1,000 mg total) by mouth 2 (two) times daily for 3 days.   folic acid 1 MG tablet Commonly known as: FOLVITE Take 1 tablet (1 mg total) by mouth daily.   multivitamin Tabs tablet Take 1 tablet by mouth daily.   thiamine 100 MG tablet Take 1 tablet (100 mg total) by mouth daily.   ticagrelor 90 MG Tabs tablet Commonly known as: BRILINTA Take 1 tablet (90 mg total) by mouth 2 (two) times daily.        Disposition and follow-up:   [name] was discharged from [facility] in Stable condition.  At the hospital follow up visit please address:  1.  Physical deconditioning, HTN, and tobacco use disorder.   2.  Labs / imaging needed at time of follow-up: NA  3.  Pending labs/ test needing follow-up: NA  Hospital Course by problem list: 1. UTI, E. Coli Bacteremia Patient presented for physical deconditioning and was found to have fever and leukocytosis to 16. Blood cultures positive x2 for E. Coli. Rocephin increased from 1 g/24hrs to 2 g. This was certainly a urinary source given the patient's UA on admission and  urine culture showing 100K GNR. Patient quickly became afebrile, leukocytosis resolved. Appeared well clinically. Urine sensitivities showed E. Coli resistance to Bactrim and ampicillin, blood cultures were essentially the same. The patient was transitioned to oral cefadroxil for a 7 day course of antibiotics (set to end 12/18).   2. Hx CVA Patient with Hx of bilateral pontine and R PCA infarcts s/p stenting of the mid-basilar artery in nov of 2022. This left the patient with some R-sided weakness. He was at [facility]) but per SW report, left AMA and went home. His caregivers there did not take good care of him and he presented to the ED for physical deconditioning and weakness. CT head negative for new infarct. Patient was continued on DAPT for stroke/stent placement. PT recommended SNF again and patient was D/C'd to [facility].   Discharge Exam:   BP 124/78 (BP Location: Right Arm)    Pulse 80    Temp 98.6 F (37 C) (Oral)    Resp 16    Ht 5\' 10"  (1.778 m)    Wt 58.1 kg    SpO2 100%    BMI 18.38 kg/m  Discharge exam:  Vitals reviewed.  Constitutional:      Comments: Thin and frail appearing elderly male lying in bed  Cardiovascular:     Rate and Rhythm: Normal rate and regular rhythm.     Pulses: Normal pulses.     Heart  sounds: No murmur heard. Pulmonary:     Effort: Pulmonary effort is normal.     Breath sounds: Normal breath sounds.  Abdominal:     General: Bowel sounds are normal.     Palpations: Abdomen is soft.     Tenderness: There is no abdominal tenderness.  Neurological:     Mental Status: He is alert.   Pertinent Labs, Studies, and Procedures:  Blood cultures as above  Discharge Instructions: Discharge Instructions     Diet - low sodium heart healthy   Complete by: As directed    Increase activity slowly   Complete by: As directed        Signed: Corky Sox, MD PGY-1

## 2021-04-19 NOTE — Progress Notes (Signed)
Mobility Specialist Progress Note ° ° 04/19/21 1131  °Mobility  °Activity Ambulated in hall  °Level of Assistance Modified independent, requires aide device or extra time  °Assistive Device Front wheel walker  °Distance Ambulated (ft) 320 ft  °Mobility Ambulated with assistance in hallway  °Mobility Response Tolerated well  °Mobility performed by Mobility specialist  °$Mobility charge 1 Mobility  ° °Received pt in bed having no complaints and agreeable to mobility. X1 seated break d/t fatigue but otherwise asx throughout ambulation, returned back to bed w/ call bell by side and all needs met. ° °Jeremiah Hedrick °Mobility Specialist °Phone Number [phone] ° °

## 2021-04-30 ENCOUNTER — Encounter: Payer: Self-pay | Admitting: Adult Health

## 2021-04-30 ENCOUNTER — Other Ambulatory Visit: Payer: Self-pay

## 2021-04-30 ENCOUNTER — Ambulatory Visit (INDEPENDENT_AMBULATORY_CARE_PROVIDER_SITE_OTHER): Payer: Medicare Other | Admitting: Adult Health

## 2021-04-30 VITALS — BP 122/82 | HR 97 | Ht 70.0 in | Wt 123.0 lb

## 2021-04-30 DIAGNOSIS — Z09 Encounter for follow-up examination after completed treatment for conditions other than malignant neoplasm: Secondary | ICD-10-CM

## 2021-04-30 DIAGNOSIS — I639 Cerebral infarction, unspecified: Secondary | ICD-10-CM | POA: Diagnosis not present
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
## 2021-04-30 NOTE — Patient Instructions (Addendum)
Continue working with PT/OT for hopeful ongoing recovery  Needs to schedule f/u visit with Dr. Estanislado Pandy regarding placement of basilar artery stent and ongoing use of Brilinta - reached out to IR during visit in attempts to schedule an appointment - IR will reach out with appointment date and time  Continue aspirin 81 mg daily and Brilinta (ticagrelor) 90 mg bid  and atorvastatin 80mg  daily  for secondary stroke prevention  Great importance for polysubstance use cessation as continued use greatly increases risk of additional strokes  Continue to follow up with PCP regarding cholesterol and blood pressure management  Maintain strict control of hypertension with blood pressure goal below 130/90 and cholesterol with LDL cholesterol (bad cholesterol) goal below 70 mg/dL.   Signs of a Stroke? Follow the BEFAST method:  Balance Watch for a sudden loss of balance, trouble with coordination or vertigo Eyes Is there a sudden loss of vision in one or both eyes? Or double vision?  Face: Ask the person to smile. Does one side of the face droop or is it numb?  Arms: Ask the person to raise both arms. Does one arm drift downward? Is there weakness or numbness of a leg? Speech: Ask the person to repeat a simple phrase. Does the speech sound slurred/strange? Is the person confused ? Time: If you observe any of these signs, call 911.     Followup in the future with me in 4 months or call earlier if needed       Thank you for coming to see [place] at [facility]. I hope we have been able to provide you high quality care today.  You may receive a patient satisfaction survey over the next few weeks. We would appreciate your feedback and comments so that we may continue to improve ourselves and the health of our patients.

## 2021-05-05 DIAGNOSIS — M6281 Muscle weakness (generalized): Secondary | ICD-10-CM | POA: Diagnosis not present

## 2021-05-05 DIAGNOSIS — R2689 Other abnormalities of gait and mobility: Secondary | ICD-10-CM | POA: Diagnosis not present

## 2021-05-06 NOTE — Progress Notes (Signed)
I agree with the above plan 

## 2021-05-09 DIAGNOSIS — R2689 Other abnormalities of gait and mobility: Secondary | ICD-10-CM | POA: Diagnosis not present

## 2021-05-09 DIAGNOSIS — I6302 Cerebral infarction due to thrombosis of basilar artery: Secondary | ICD-10-CM | POA: Diagnosis not present

## 2021-05-09 DIAGNOSIS — M6281 Muscle weakness (generalized): Secondary | ICD-10-CM | POA: Diagnosis not present

## 2021-05-10 DIAGNOSIS — I6302 Cerebral infarction due to thrombosis of basilar artery: Secondary | ICD-10-CM | POA: Diagnosis not present

## 2021-05-10 DIAGNOSIS — R2689 Other abnormalities of gait and mobility: Secondary | ICD-10-CM | POA: Diagnosis not present

## 2021-05-10 DIAGNOSIS — M6281 Muscle weakness (generalized): Secondary | ICD-10-CM | POA: Diagnosis not present

## 2021-05-13 ENCOUNTER — Other Ambulatory Visit: Payer: Self-pay

## 2021-05-13 ENCOUNTER — Ambulatory Visit (HOSPITAL_COMMUNITY)
Admission: RE | Admit: 2021-05-13 | Discharge: 2021-05-13 | Disposition: A | Discharge status: Home / Self Care | Payer: Commercial Managed Care - HMO | Source: Ambulatory Visit | Attending: Radiology | Admitting: Radiology

## 2021-05-13 DIAGNOSIS — I6322 Cerebral infarction due to unspecified occlusion or stenosis of basilar arteries: Secondary | ICD-10-CM

## 2021-05-13 DIAGNOSIS — M6281 Muscle weakness (generalized): Secondary | ICD-10-CM | POA: Diagnosis not present

## 2021-05-13 DIAGNOSIS — I6302 Cerebral infarction due to thrombosis of basilar artery: Secondary | ICD-10-CM | POA: Diagnosis not present

## 2021-05-13 DIAGNOSIS — R2689 Other abnormalities of gait and mobility: Secondary | ICD-10-CM | POA: Diagnosis not present

## 2021-05-13 DIAGNOSIS — I63233 Cerebral infarction due to unspecified occlusion or stenosis of bilateral carotid arteries: Secondary | ICD-10-CM | POA: Diagnosis not present

## 2021-05-14 DIAGNOSIS — R2689 Other abnormalities of gait and mobility: Secondary | ICD-10-CM | POA: Diagnosis not present

## 2021-05-14 DIAGNOSIS — I6302 Cerebral infarction due to thrombosis of basilar artery: Secondary | ICD-10-CM | POA: Diagnosis not present

## 2021-05-14 DIAGNOSIS — M6281 Muscle weakness (generalized): Secondary | ICD-10-CM | POA: Diagnosis not present

## 2021-05-14 HISTORY — PX: IR RADIOLOGIST EVAL & MGMT: IMG5224

## 2021-05-15 DIAGNOSIS — M6281 Muscle weakness (generalized): Secondary | ICD-10-CM | POA: Diagnosis not present

## 2021-05-15 DIAGNOSIS — I6302 Cerebral infarction due to thrombosis of basilar artery: Secondary | ICD-10-CM | POA: Diagnosis not present

## 2021-05-15 DIAGNOSIS — R2689 Other abnormalities of gait and mobility: Secondary | ICD-10-CM | POA: Diagnosis not present

## 2021-05-16 DIAGNOSIS — M6281 Muscle weakness (generalized): Secondary | ICD-10-CM | POA: Diagnosis not present

## 2021-05-16 DIAGNOSIS — R2689 Other abnormalities of gait and mobility: Secondary | ICD-10-CM | POA: Diagnosis not present

## 2021-05-16 DIAGNOSIS — I6302 Cerebral infarction due to thrombosis of basilar artery: Secondary | ICD-10-CM | POA: Diagnosis not present

## 2021-05-17 DIAGNOSIS — R2689 Other abnormalities of gait and mobility: Secondary | ICD-10-CM | POA: Diagnosis not present

## 2021-05-17 DIAGNOSIS — M6281 Muscle weakness (generalized): Secondary | ICD-10-CM | POA: Diagnosis not present

## 2021-05-17 DIAGNOSIS — I6302 Cerebral infarction due to thrombosis of basilar artery: Secondary | ICD-10-CM | POA: Diagnosis not present

## 2021-05-20 DIAGNOSIS — M6281 Muscle weakness (generalized): Secondary | ICD-10-CM | POA: Diagnosis not present

## 2021-05-20 DIAGNOSIS — R2689 Other abnormalities of gait and mobility: Secondary | ICD-10-CM | POA: Diagnosis not present

## 2021-05-20 DIAGNOSIS — I6302 Cerebral infarction due to thrombosis of basilar artery: Secondary | ICD-10-CM | POA: Diagnosis not present

## 2021-05-21 DIAGNOSIS — M6281 Muscle weakness (generalized): Secondary | ICD-10-CM | POA: Diagnosis not present

## 2021-05-21 DIAGNOSIS — I6302 Cerebral infarction due to thrombosis of basilar artery: Secondary | ICD-10-CM | POA: Diagnosis not present

## 2021-05-21 DIAGNOSIS — R2689 Other abnormalities of gait and mobility: Secondary | ICD-10-CM | POA: Diagnosis not present

## 2021-05-22 DIAGNOSIS — R2689 Other abnormalities of gait and mobility: Secondary | ICD-10-CM | POA: Diagnosis not present

## 2021-05-22 DIAGNOSIS — M6281 Muscle weakness (generalized): Secondary | ICD-10-CM | POA: Diagnosis not present

## 2021-05-22 DIAGNOSIS — I6302 Cerebral infarction due to thrombosis of basilar artery: Secondary | ICD-10-CM | POA: Diagnosis not present

## 2021-05-23 DIAGNOSIS — K5900 Constipation, unspecified: Secondary | ICD-10-CM | POA: Diagnosis not present

## 2021-05-23 DIAGNOSIS — E785 Hyperlipidemia, unspecified: Secondary | ICD-10-CM | POA: Diagnosis not present

## 2021-05-23 DIAGNOSIS — I10 Essential (primary) hypertension: Secondary | ICD-10-CM | POA: Diagnosis not present

## 2021-05-23 DIAGNOSIS — Z79899 Other long term (current) drug therapy: Secondary | ICD-10-CM | POA: Diagnosis not present

## 2021-05-24 DIAGNOSIS — R2689 Other abnormalities of gait and mobility: Secondary | ICD-10-CM | POA: Diagnosis not present

## 2021-05-24 DIAGNOSIS — M6281 Muscle weakness (generalized): Secondary | ICD-10-CM | POA: Diagnosis not present

## 2021-05-24 DIAGNOSIS — I6302 Cerebral infarction due to thrombosis of basilar artery: Secondary | ICD-10-CM | POA: Diagnosis not present

## 2021-05-27 DIAGNOSIS — I6302 Cerebral infarction due to thrombosis of basilar artery: Secondary | ICD-10-CM | POA: Diagnosis not present

## 2021-05-27 DIAGNOSIS — M6281 Muscle weakness (generalized): Secondary | ICD-10-CM | POA: Diagnosis not present

## 2021-05-27 DIAGNOSIS — R2689 Other abnormalities of gait and mobility: Secondary | ICD-10-CM | POA: Diagnosis not present

## 2021-05-28 DIAGNOSIS — I6302 Cerebral infarction due to thrombosis of basilar artery: Secondary | ICD-10-CM | POA: Diagnosis not present

## 2021-05-28 DIAGNOSIS — R2689 Other abnormalities of gait and mobility: Secondary | ICD-10-CM | POA: Diagnosis not present

## 2021-05-28 DIAGNOSIS — M6281 Muscle weakness (generalized): Secondary | ICD-10-CM | POA: Diagnosis not present

## 2021-05-29 DIAGNOSIS — I6302 Cerebral infarction due to thrombosis of basilar artery: Secondary | ICD-10-CM | POA: Diagnosis not present

## 2021-05-29 DIAGNOSIS — M6281 Muscle weakness (generalized): Secondary | ICD-10-CM | POA: Diagnosis not present

## 2021-05-29 DIAGNOSIS — R2689 Other abnormalities of gait and mobility: Secondary | ICD-10-CM | POA: Diagnosis not present

## 2021-05-30 DIAGNOSIS — I6302 Cerebral infarction due to thrombosis of basilar artery: Secondary | ICD-10-CM | POA: Diagnosis not present

## 2021-05-30 DIAGNOSIS — R2689 Other abnormalities of gait and mobility: Secondary | ICD-10-CM | POA: Diagnosis not present

## 2021-05-30 DIAGNOSIS — M6281 Muscle weakness (generalized): Secondary | ICD-10-CM | POA: Diagnosis not present

## 2021-05-31 DIAGNOSIS — R2689 Other abnormalities of gait and mobility: Secondary | ICD-10-CM | POA: Diagnosis not present

## 2021-05-31 DIAGNOSIS — M6281 Muscle weakness (generalized): Secondary | ICD-10-CM | POA: Diagnosis not present

## 2021-05-31 DIAGNOSIS — I6302 Cerebral infarction due to thrombosis of basilar artery: Secondary | ICD-10-CM | POA: Diagnosis not present

## 2021-06-01 DIAGNOSIS — I6302 Cerebral infarction due to thrombosis of basilar artery: Secondary | ICD-10-CM | POA: Diagnosis not present

## 2021-06-01 DIAGNOSIS — R2689 Other abnormalities of gait and mobility: Secondary | ICD-10-CM | POA: Diagnosis not present

## 2021-06-01 DIAGNOSIS — M6281 Muscle weakness (generalized): Secondary | ICD-10-CM | POA: Diagnosis not present

## 2021-06-03 DIAGNOSIS — M6281 Muscle weakness (generalized): Secondary | ICD-10-CM | POA: Diagnosis not present

## 2021-06-03 DIAGNOSIS — R2689 Other abnormalities of gait and mobility: Secondary | ICD-10-CM | POA: Diagnosis not present

## 2021-06-03 DIAGNOSIS — I6302 Cerebral infarction due to thrombosis of basilar artery: Secondary | ICD-10-CM | POA: Diagnosis not present

## 2021-06-04 DIAGNOSIS — R2689 Other abnormalities of gait and mobility: Secondary | ICD-10-CM | POA: Diagnosis not present

## 2021-06-04 DIAGNOSIS — M6281 Muscle weakness (generalized): Secondary | ICD-10-CM | POA: Diagnosis not present

## 2021-06-04 DIAGNOSIS — I6302 Cerebral infarction due to thrombosis of basilar artery: Secondary | ICD-10-CM | POA: Diagnosis not present

## 2021-06-05 DIAGNOSIS — M6281 Muscle weakness (generalized): Secondary | ICD-10-CM | POA: Diagnosis not present

## 2021-06-05 DIAGNOSIS — N390 Urinary tract infection, site not specified: Secondary | ICD-10-CM | POA: Diagnosis not present

## 2021-06-05 DIAGNOSIS — I6302 Cerebral infarction due to thrombosis of basilar artery: Secondary | ICD-10-CM | POA: Diagnosis not present

## 2021-06-05 DIAGNOSIS — R2689 Other abnormalities of gait and mobility: Secondary | ICD-10-CM | POA: Diagnosis not present

## 2021-06-06 DIAGNOSIS — N390 Urinary tract infection, site not specified: Secondary | ICD-10-CM | POA: Diagnosis not present

## 2021-06-06 DIAGNOSIS — M6281 Muscle weakness (generalized): Secondary | ICD-10-CM | POA: Diagnosis not present

## 2021-06-06 DIAGNOSIS — R2689 Other abnormalities of gait and mobility: Secondary | ICD-10-CM | POA: Diagnosis not present

## 2021-06-06 DIAGNOSIS — I6302 Cerebral infarction due to thrombosis of basilar artery: Secondary | ICD-10-CM | POA: Diagnosis not present

## 2021-06-07 DIAGNOSIS — R2689 Other abnormalities of gait and mobility: Secondary | ICD-10-CM | POA: Diagnosis not present

## 2021-06-07 DIAGNOSIS — M6281 Muscle weakness (generalized): Secondary | ICD-10-CM | POA: Diagnosis not present

## 2021-06-07 DIAGNOSIS — N390 Urinary tract infection, site not specified: Secondary | ICD-10-CM | POA: Diagnosis not present

## 2021-06-07 DIAGNOSIS — I6302 Cerebral infarction due to thrombosis of basilar artery: Secondary | ICD-10-CM | POA: Diagnosis not present

## 2021-06-08 DIAGNOSIS — M6281 Muscle weakness (generalized): Secondary | ICD-10-CM | POA: Diagnosis not present

## 2021-06-08 DIAGNOSIS — R2689 Other abnormalities of gait and mobility: Secondary | ICD-10-CM | POA: Diagnosis not present

## 2021-06-08 DIAGNOSIS — N390 Urinary tract infection, site not specified: Secondary | ICD-10-CM | POA: Diagnosis not present

## 2021-06-08 DIAGNOSIS — I6302 Cerebral infarction due to thrombosis of basilar artery: Secondary | ICD-10-CM | POA: Diagnosis not present

## 2021-06-10 DIAGNOSIS — M6281 Muscle weakness (generalized): Secondary | ICD-10-CM | POA: Diagnosis not present

## 2021-06-10 DIAGNOSIS — N390 Urinary tract infection, site not specified: Secondary | ICD-10-CM | POA: Diagnosis not present

## 2021-06-10 DIAGNOSIS — I6302 Cerebral infarction due to thrombosis of basilar artery: Secondary | ICD-10-CM | POA: Diagnosis not present

## 2021-06-10 DIAGNOSIS — R2689 Other abnormalities of gait and mobility: Secondary | ICD-10-CM | POA: Diagnosis not present

## 2021-06-11 DIAGNOSIS — R2689 Other abnormalities of gait and mobility: Secondary | ICD-10-CM | POA: Diagnosis not present

## 2021-06-11 DIAGNOSIS — N390 Urinary tract infection, site not specified: Secondary | ICD-10-CM | POA: Diagnosis not present

## 2021-06-11 DIAGNOSIS — M6281 Muscle weakness (generalized): Secondary | ICD-10-CM | POA: Diagnosis not present

## 2021-06-11 DIAGNOSIS — I6302 Cerebral infarction due to thrombosis of basilar artery: Secondary | ICD-10-CM | POA: Diagnosis not present

## 2021-06-12 DIAGNOSIS — I6302 Cerebral infarction due to thrombosis of basilar artery: Secondary | ICD-10-CM | POA: Diagnosis not present

## 2021-06-12 DIAGNOSIS — M6281 Muscle weakness (generalized): Secondary | ICD-10-CM | POA: Diagnosis not present

## 2021-06-12 DIAGNOSIS — N390 Urinary tract infection, site not specified: Secondary | ICD-10-CM | POA: Diagnosis not present

## 2021-06-12 DIAGNOSIS — R2689 Other abnormalities of gait and mobility: Secondary | ICD-10-CM | POA: Diagnosis not present

## 2021-06-13 DIAGNOSIS — R2689 Other abnormalities of gait and mobility: Secondary | ICD-10-CM | POA: Diagnosis not present

## 2021-06-13 DIAGNOSIS — N390 Urinary tract infection, site not specified: Secondary | ICD-10-CM | POA: Diagnosis not present

## 2021-06-13 DIAGNOSIS — M6281 Muscle weakness (generalized): Secondary | ICD-10-CM | POA: Diagnosis not present

## 2021-06-13 DIAGNOSIS — I6302 Cerebral infarction due to thrombosis of basilar artery: Secondary | ICD-10-CM | POA: Diagnosis not present

## 2021-06-14 DIAGNOSIS — N390 Urinary tract infection, site not specified: Secondary | ICD-10-CM | POA: Diagnosis not present

## 2021-06-14 DIAGNOSIS — I6302 Cerebral infarction due to thrombosis of basilar artery: Secondary | ICD-10-CM | POA: Diagnosis not present

## 2021-06-14 DIAGNOSIS — M6281 Muscle weakness (generalized): Secondary | ICD-10-CM | POA: Diagnosis not present

## 2021-06-14 DIAGNOSIS — R2689 Other abnormalities of gait and mobility: Secondary | ICD-10-CM | POA: Diagnosis not present

## 2021-06-17 DIAGNOSIS — R2689 Other abnormalities of gait and mobility: Secondary | ICD-10-CM | POA: Diagnosis not present

## 2021-06-17 DIAGNOSIS — M6281 Muscle weakness (generalized): Secondary | ICD-10-CM | POA: Diagnosis not present

## 2021-06-17 DIAGNOSIS — I6302 Cerebral infarction due to thrombosis of basilar artery: Secondary | ICD-10-CM | POA: Diagnosis not present

## 2021-06-17 DIAGNOSIS — N390 Urinary tract infection, site not specified: Secondary | ICD-10-CM | POA: Diagnosis not present

## 2021-06-18 DIAGNOSIS — I6302 Cerebral infarction due to thrombosis of basilar artery: Secondary | ICD-10-CM | POA: Diagnosis not present

## 2021-06-18 DIAGNOSIS — M6281 Muscle weakness (generalized): Secondary | ICD-10-CM | POA: Diagnosis not present

## 2021-06-18 DIAGNOSIS — R2689 Other abnormalities of gait and mobility: Secondary | ICD-10-CM | POA: Diagnosis not present

## 2021-06-18 DIAGNOSIS — N390 Urinary tract infection, site not specified: Secondary | ICD-10-CM | POA: Diagnosis not present

## 2021-06-19 DIAGNOSIS — M6281 Muscle weakness (generalized): Secondary | ICD-10-CM | POA: Diagnosis not present

## 2021-06-19 DIAGNOSIS — I6302 Cerebral infarction due to thrombosis of basilar artery: Secondary | ICD-10-CM | POA: Diagnosis not present

## 2021-06-19 DIAGNOSIS — N390 Urinary tract infection, site not specified: Secondary | ICD-10-CM | POA: Diagnosis not present

## 2021-06-19 DIAGNOSIS — R2689 Other abnormalities of gait and mobility: Secondary | ICD-10-CM | POA: Diagnosis not present

## 2021-06-20 DIAGNOSIS — M6281 Muscle weakness (generalized): Secondary | ICD-10-CM | POA: Diagnosis not present

## 2021-06-20 DIAGNOSIS — N390 Urinary tract infection, site not specified: Secondary | ICD-10-CM | POA: Diagnosis not present

## 2021-06-20 DIAGNOSIS — I6302 Cerebral infarction due to thrombosis of basilar artery: Secondary | ICD-10-CM | POA: Diagnosis not present

## 2021-06-20 DIAGNOSIS — I10 Essential (primary) hypertension: Secondary | ICD-10-CM | POA: Diagnosis not present

## 2021-06-20 DIAGNOSIS — R2689 Other abnormalities of gait and mobility: Secondary | ICD-10-CM | POA: Diagnosis not present

## 2021-06-20 DIAGNOSIS — E785 Hyperlipidemia, unspecified: Secondary | ICD-10-CM | POA: Diagnosis not present

## 2021-06-20 DIAGNOSIS — K5900 Constipation, unspecified: Secondary | ICD-10-CM | POA: Diagnosis not present

## 2021-06-21 DIAGNOSIS — N390 Urinary tract infection, site not specified: Secondary | ICD-10-CM | POA: Diagnosis not present

## 2021-06-21 DIAGNOSIS — R2689 Other abnormalities of gait and mobility: Secondary | ICD-10-CM | POA: Diagnosis not present

## 2021-06-21 DIAGNOSIS — I6302 Cerebral infarction due to thrombosis of basilar artery: Secondary | ICD-10-CM | POA: Diagnosis not present

## 2021-06-21 DIAGNOSIS — M6281 Muscle weakness (generalized): Secondary | ICD-10-CM | POA: Diagnosis not present

## 2021-06-24 DIAGNOSIS — N390 Urinary tract infection, site not specified: Secondary | ICD-10-CM | POA: Diagnosis not present

## 2021-06-24 DIAGNOSIS — R2689 Other abnormalities of gait and mobility: Secondary | ICD-10-CM | POA: Diagnosis not present

## 2021-06-24 DIAGNOSIS — M6281 Muscle weakness (generalized): Secondary | ICD-10-CM | POA: Diagnosis not present

## 2021-06-24 DIAGNOSIS — I6302 Cerebral infarction due to thrombosis of basilar artery: Secondary | ICD-10-CM | POA: Diagnosis not present

## 2021-06-25 DIAGNOSIS — M6281 Muscle weakness (generalized): Secondary | ICD-10-CM | POA: Diagnosis not present

## 2021-06-25 DIAGNOSIS — R2689 Other abnormalities of gait and mobility: Secondary | ICD-10-CM | POA: Diagnosis not present

## 2021-06-25 DIAGNOSIS — I6302 Cerebral infarction due to thrombosis of basilar artery: Secondary | ICD-10-CM | POA: Diagnosis not present

## 2021-06-25 DIAGNOSIS — N390 Urinary tract infection, site not specified: Secondary | ICD-10-CM | POA: Diagnosis not present

## 2021-06-26 DIAGNOSIS — R2689 Other abnormalities of gait and mobility: Secondary | ICD-10-CM | POA: Diagnosis not present

## 2021-06-26 DIAGNOSIS — N390 Urinary tract infection, site not specified: Secondary | ICD-10-CM | POA: Diagnosis not present

## 2021-06-26 DIAGNOSIS — I6302 Cerebral infarction due to thrombosis of basilar artery: Secondary | ICD-10-CM | POA: Diagnosis not present

## 2021-06-26 DIAGNOSIS — M6281 Muscle weakness (generalized): Secondary | ICD-10-CM | POA: Diagnosis not present

## 2021-06-27 ENCOUNTER — Other Ambulatory Visit: Payer: Self-pay

## 2021-06-27 DIAGNOSIS — I6302 Cerebral infarction due to thrombosis of basilar artery: Secondary | ICD-10-CM | POA: Diagnosis not present

## 2021-06-27 DIAGNOSIS — M6281 Muscle weakness (generalized): Secondary | ICD-10-CM | POA: Diagnosis not present

## 2021-06-27 DIAGNOSIS — R2689 Other abnormalities of gait and mobility: Secondary | ICD-10-CM | POA: Diagnosis not present

## 2021-06-27 DIAGNOSIS — N390 Urinary tract infection, site not specified: Secondary | ICD-10-CM | POA: Diagnosis not present

## 2021-06-27 NOTE — Patient Outreach (Signed)
[facility]) Care Management  06/27/2021  Andre [name] [birth year] [id number]   First telephone outreach attempt to obtain mRS. No answer. Left message for returned call.  Philmore Pali [facility] Management Assistant [phone]

## 2021-06-28 DIAGNOSIS — N390 Urinary tract infection, site not specified: Secondary | ICD-10-CM | POA: Diagnosis not present

## 2021-06-28 DIAGNOSIS — R2689 Other abnormalities of gait and mobility: Secondary | ICD-10-CM | POA: Diagnosis not present

## 2021-06-28 DIAGNOSIS — M6281 Muscle weakness (generalized): Secondary | ICD-10-CM | POA: Diagnosis not present

## 2021-06-28 DIAGNOSIS — I6302 Cerebral infarction due to thrombosis of basilar artery: Secondary | ICD-10-CM | POA: Diagnosis not present

## 2021-07-01 ENCOUNTER — Other Ambulatory Visit: Payer: Self-pay

## 2021-07-01 DIAGNOSIS — I6302 Cerebral infarction due to thrombosis of basilar artery: Secondary | ICD-10-CM | POA: Diagnosis not present

## 2021-07-01 DIAGNOSIS — R2689 Other abnormalities of gait and mobility: Secondary | ICD-10-CM | POA: Diagnosis not present

## 2021-07-01 DIAGNOSIS — M6281 Muscle weakness (generalized): Secondary | ICD-10-CM | POA: Diagnosis not present

## 2021-07-01 DIAGNOSIS — N390 Urinary tract infection, site not specified: Secondary | ICD-10-CM | POA: Diagnosis not present

## 2021-07-01 NOTE — Patient Outreach (Signed)
[facility]) Care Management  07/01/2021  Andre [name] Sep 06, 1950 [id number]   Second telephone outreach attempt to obtain mRS. No answer. Left message for returned call.  Philmore Pali [facility] Management Assistant [phone]

## 2021-07-02 DIAGNOSIS — R2689 Other abnormalities of gait and mobility: Secondary | ICD-10-CM | POA: Diagnosis not present

## 2021-07-02 DIAGNOSIS — I6302 Cerebral infarction due to thrombosis of basilar artery: Secondary | ICD-10-CM | POA: Diagnosis not present

## 2021-07-02 DIAGNOSIS — M6281 Muscle weakness (generalized): Secondary | ICD-10-CM | POA: Diagnosis not present

## 2021-07-02 DIAGNOSIS — N390 Urinary tract infection, site not specified: Secondary | ICD-10-CM | POA: Diagnosis not present

## 2021-07-03 ENCOUNTER — Other Ambulatory Visit: Payer: Self-pay

## 2021-07-03 DIAGNOSIS — M6281 Muscle weakness (generalized): Secondary | ICD-10-CM | POA: Diagnosis not present

## 2021-07-03 DIAGNOSIS — N390 Urinary tract infection, site not specified: Secondary | ICD-10-CM | POA: Diagnosis not present

## 2021-07-03 NOTE — Patient Outreach (Signed)
[facility]) Care Management ? ?07/03/2021 ? ?Andre [name] ?04/29/1951 ?[id number] ? ? ?3 outreach attempts were completed to obtain mRs. mRs could not be obtained because patient never returned my calls. mRs=7 ?  ? ?Andre [name] ?Care Management Assistant ?[phone] ? ?

## 2021-07-04 DIAGNOSIS — M6281 Muscle weakness (generalized): Secondary | ICD-10-CM | POA: Diagnosis not present

## 2021-07-04 DIAGNOSIS — N390 Urinary tract infection, site not specified: Secondary | ICD-10-CM | POA: Diagnosis not present

## 2021-07-05 DIAGNOSIS — M6281 Muscle weakness (generalized): Secondary | ICD-10-CM | POA: Diagnosis not present

## 2021-07-05 DIAGNOSIS — N390 Urinary tract infection, site not specified: Secondary | ICD-10-CM | POA: Diagnosis not present

## 2021-07-07 DIAGNOSIS — N390 Urinary tract infection, site not specified: Secondary | ICD-10-CM | POA: Diagnosis not present

## 2021-07-07 DIAGNOSIS — M6281 Muscle weakness (generalized): Secondary | ICD-10-CM | POA: Diagnosis not present

## 2021-07-08 DIAGNOSIS — M6281 Muscle weakness (generalized): Secondary | ICD-10-CM | POA: Diagnosis not present

## 2021-07-08 DIAGNOSIS — N390 Urinary tract infection, site not specified: Secondary | ICD-10-CM | POA: Diagnosis not present

## 2021-07-09 DIAGNOSIS — M6281 Muscle weakness (generalized): Secondary | ICD-10-CM | POA: Diagnosis not present

## 2021-07-09 DIAGNOSIS — N390 Urinary tract infection, site not specified: Secondary | ICD-10-CM | POA: Diagnosis not present

## 2021-07-10 DIAGNOSIS — N390 Urinary tract infection, site not specified: Secondary | ICD-10-CM | POA: Diagnosis not present

## 2021-07-10 DIAGNOSIS — M6281 Muscle weakness (generalized): Secondary | ICD-10-CM | POA: Diagnosis not present

## 2021-07-12 DIAGNOSIS — N390 Urinary tract infection, site not specified: Secondary | ICD-10-CM | POA: Diagnosis not present

## 2021-07-12 DIAGNOSIS — M6281 Muscle weakness (generalized): Secondary | ICD-10-CM | POA: Diagnosis not present

## 2021-07-16 DIAGNOSIS — M6281 Muscle weakness (generalized): Secondary | ICD-10-CM | POA: Diagnosis not present

## 2021-07-16 DIAGNOSIS — N390 Urinary tract infection, site not specified: Secondary | ICD-10-CM | POA: Diagnosis not present

## 2021-07-17 DIAGNOSIS — M6281 Muscle weakness (generalized): Secondary | ICD-10-CM | POA: Diagnosis not present

## 2021-07-17 DIAGNOSIS — N390 Urinary tract infection, site not specified: Secondary | ICD-10-CM | POA: Diagnosis not present

## 2021-07-18 DIAGNOSIS — M6281 Muscle weakness (generalized): Secondary | ICD-10-CM | POA: Diagnosis not present

## 2021-07-18 DIAGNOSIS — N390 Urinary tract infection, site not specified: Secondary | ICD-10-CM | POA: Diagnosis not present

## 2021-07-19 DIAGNOSIS — N390 Urinary tract infection, site not specified: Secondary | ICD-10-CM | POA: Diagnosis not present

## 2021-07-19 DIAGNOSIS — M6281 Muscle weakness (generalized): Secondary | ICD-10-CM | POA: Diagnosis not present

## 2021-07-22 DIAGNOSIS — N390 Urinary tract infection, site not specified: Secondary | ICD-10-CM | POA: Diagnosis not present

## 2021-07-22 DIAGNOSIS — Z79899 Other long term (current) drug therapy: Secondary | ICD-10-CM | POA: Diagnosis not present

## 2021-07-22 DIAGNOSIS — I10 Essential (primary) hypertension: Secondary | ICD-10-CM | POA: Diagnosis not present

## 2021-07-22 DIAGNOSIS — K5900 Constipation, unspecified: Secondary | ICD-10-CM | POA: Diagnosis not present

## 2021-07-22 DIAGNOSIS — E785 Hyperlipidemia, unspecified: Secondary | ICD-10-CM | POA: Diagnosis not present

## 2021-07-22 DIAGNOSIS — M6281 Muscle weakness (generalized): Secondary | ICD-10-CM | POA: Diagnosis not present

## 2021-07-23 DIAGNOSIS — M6281 Muscle weakness (generalized): Secondary | ICD-10-CM | POA: Diagnosis not present

## 2021-07-23 DIAGNOSIS — N390 Urinary tract infection, site not specified: Secondary | ICD-10-CM | POA: Diagnosis not present

## 2021-07-24 DIAGNOSIS — M6281 Muscle weakness (generalized): Secondary | ICD-10-CM | POA: Diagnosis not present

## 2021-07-24 DIAGNOSIS — N390 Urinary tract infection, site not specified: Secondary | ICD-10-CM | POA: Diagnosis not present

## 2021-07-25 DIAGNOSIS — N390 Urinary tract infection, site not specified: Secondary | ICD-10-CM | POA: Diagnosis not present

## 2021-07-25 DIAGNOSIS — M6281 Muscle weakness (generalized): Secondary | ICD-10-CM | POA: Diagnosis not present

## 2021-07-26 DIAGNOSIS — N390 Urinary tract infection, site not specified: Secondary | ICD-10-CM | POA: Diagnosis not present

## 2021-07-26 DIAGNOSIS — M6281 Muscle weakness (generalized): Secondary | ICD-10-CM | POA: Diagnosis not present

## 2021-07-27 DIAGNOSIS — N390 Urinary tract infection, site not specified: Secondary | ICD-10-CM | POA: Diagnosis not present

## 2021-07-27 DIAGNOSIS — M6281 Muscle weakness (generalized): Secondary | ICD-10-CM | POA: Diagnosis not present

## 2021-07-30 DIAGNOSIS — N390 Urinary tract infection, site not specified: Secondary | ICD-10-CM | POA: Diagnosis not present

## 2021-07-30 DIAGNOSIS — M6281 Muscle weakness (generalized): Secondary | ICD-10-CM | POA: Diagnosis not present

## 2021-07-31 DIAGNOSIS — M6281 Muscle weakness (generalized): Secondary | ICD-10-CM | POA: Diagnosis not present

## 2021-07-31 DIAGNOSIS — N390 Urinary tract infection, site not specified: Secondary | ICD-10-CM | POA: Diagnosis not present

## 2021-08-01 DIAGNOSIS — M6281 Muscle weakness (generalized): Secondary | ICD-10-CM | POA: Diagnosis not present

## 2021-08-01 DIAGNOSIS — N390 Urinary tract infection, site not specified: Secondary | ICD-10-CM | POA: Diagnosis not present

## 2021-08-02 DIAGNOSIS — N390 Urinary tract infection, site not specified: Secondary | ICD-10-CM | POA: Diagnosis not present

## 2021-08-02 DIAGNOSIS — M6281 Muscle weakness (generalized): Secondary | ICD-10-CM | POA: Diagnosis not present

## 2021-08-05 DIAGNOSIS — N390 Urinary tract infection, site not specified: Secondary | ICD-10-CM | POA: Diagnosis not present

## 2021-08-05 DIAGNOSIS — M6281 Muscle weakness (generalized): Secondary | ICD-10-CM | POA: Diagnosis not present

## 2021-08-06 DIAGNOSIS — M6281 Muscle weakness (generalized): Secondary | ICD-10-CM | POA: Diagnosis not present

## 2021-08-06 DIAGNOSIS — N390 Urinary tract infection, site not specified: Secondary | ICD-10-CM | POA: Diagnosis not present

## 2021-08-07 DIAGNOSIS — M6281 Muscle weakness (generalized): Secondary | ICD-10-CM | POA: Diagnosis not present

## 2021-08-07 DIAGNOSIS — N390 Urinary tract infection, site not specified: Secondary | ICD-10-CM | POA: Diagnosis not present

## 2021-08-08 DIAGNOSIS — M6281 Muscle weakness (generalized): Secondary | ICD-10-CM | POA: Diagnosis not present

## 2021-08-08 DIAGNOSIS — N390 Urinary tract infection, site not specified: Secondary | ICD-10-CM | POA: Diagnosis not present

## 2021-08-09 DIAGNOSIS — M6281 Muscle weakness (generalized): Secondary | ICD-10-CM | POA: Diagnosis not present

## 2021-08-09 DIAGNOSIS — N390 Urinary tract infection, site not specified: Secondary | ICD-10-CM | POA: Diagnosis not present

## 2021-08-12 DIAGNOSIS — M6281 Muscle weakness (generalized): Secondary | ICD-10-CM | POA: Diagnosis not present

## 2021-08-12 DIAGNOSIS — N390 Urinary tract infection, site not specified: Secondary | ICD-10-CM | POA: Diagnosis not present

## 2021-08-13 DIAGNOSIS — M6281 Muscle weakness (generalized): Secondary | ICD-10-CM | POA: Diagnosis not present

## 2021-08-13 DIAGNOSIS — N390 Urinary tract infection, site not specified: Secondary | ICD-10-CM | POA: Diagnosis not present

## 2021-08-14 DIAGNOSIS — M6281 Muscle weakness (generalized): Secondary | ICD-10-CM | POA: Diagnosis not present

## 2021-08-14 DIAGNOSIS — N390 Urinary tract infection, site not specified: Secondary | ICD-10-CM | POA: Diagnosis not present

## 2021-08-15 DIAGNOSIS — M6281 Muscle weakness (generalized): Secondary | ICD-10-CM | POA: Diagnosis not present

## 2021-08-15 DIAGNOSIS — N390 Urinary tract infection, site not specified: Secondary | ICD-10-CM | POA: Diagnosis not present

## 2021-08-16 DIAGNOSIS — N390 Urinary tract infection, site not specified: Secondary | ICD-10-CM | POA: Diagnosis not present

## 2021-08-16 DIAGNOSIS — M6281 Muscle weakness (generalized): Secondary | ICD-10-CM | POA: Diagnosis not present

## 2021-08-18 DIAGNOSIS — M79675 Pain in left toe(s): Secondary | ICD-10-CM | POA: Diagnosis not present

## 2021-08-18 DIAGNOSIS — M79674 Pain in right toe(s): Secondary | ICD-10-CM | POA: Diagnosis not present

## 2021-08-18 DIAGNOSIS — B351 Tinea unguium: Secondary | ICD-10-CM | POA: Diagnosis not present

## 2021-08-19 ENCOUNTER — Encounter: Payer: Self-pay | Admitting: Adult Health

## 2021-08-19 ENCOUNTER — Ambulatory Visit (INDEPENDENT_AMBULATORY_CARE_PROVIDER_SITE_OTHER): Payer: Medicare Other | Admitting: Adult Health

## 2021-08-19 VITALS — BP 99/64 | HR 76 | Ht 70.0 in | Wt 123.0 lb

## 2021-08-19 DIAGNOSIS — I639 Cerebral infarction, unspecified: Secondary | ICD-10-CM | POA: Diagnosis not present

## 2021-08-19 DIAGNOSIS — I10 Essential (primary) hypertension: Secondary | ICD-10-CM | POA: Diagnosis not present

## 2021-08-19 DIAGNOSIS — M6281 Muscle weakness (generalized): Secondary | ICD-10-CM | POA: Diagnosis not present

## 2021-08-19 DIAGNOSIS — N390 Urinary tract infection, site not specified: Secondary | ICD-10-CM | POA: Diagnosis not present

## 2021-08-19 DIAGNOSIS — E785 Hyperlipidemia, unspecified: Secondary | ICD-10-CM | POA: Diagnosis not present

## 2021-08-19 NOTE — Patient Instructions (Addendum)
Continue working with therapies for hopeful ongoing recovery ? ?Continue aspirin 81 mg daily  and atorvastatin for secondary stroke prevention ? ?Please contact IR Dr. Estanislado Pandy to schedule follow-up CTA head/neck at [phone] as this was recommended to be completed around first week of April - ongoing use of plavix will be determined by IR ? ?Continue to follow up with PCP regarding cholesterol and blood pressure management  ?Maintain strict control of hypertension with blood pressure goal below 130/90 and cholesterol with LDL cholesterol (bad cholesterol) goal below 70 mg/dL.  ? ?Signs of a Stroke? Follow the BEFAST method:  ?Balance Watch for a sudden loss of balance, trouble with coordination or vertigo ?Eyes Is there a sudden loss of vision in one or both eyes? Or double vision?  ?Face: Ask the person to smile. Does one side of the face droop or is it numb?  ?Arms: Ask the person to raise both arms. Does one arm drift downward? Is there weakness or numbness of a leg? ?Speech: Ask the person to repeat a simple phrase. Does the speech sound slurred/strange? Is the person confused ? ?Time: If you observe any of these signs, call 911. ? ? ? ?Overall stable from stroke standpoint without further recommendations.  Can follow-up as needed at this time.  For any new issues or concerns not stroke related, please place new referral for evaluation ? ? ? ? ? ?Thank you for coming to see [place] at [facility]. I hope we have been able to provide you high quality care today. ? ?You may receive a patient satisfaction survey over the next few weeks. We would appreciate your feedback and comments so that we may continue to improve ourselves and the health of our patients. ? ?
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
## 2021-08-20 DIAGNOSIS — M6281 Muscle weakness (generalized): Secondary | ICD-10-CM | POA: Diagnosis not present

## 2021-08-20 DIAGNOSIS — N390 Urinary tract infection, site not specified: Secondary | ICD-10-CM | POA: Diagnosis not present

## 2021-08-21 DIAGNOSIS — N390 Urinary tract infection, site not specified: Secondary | ICD-10-CM | POA: Diagnosis not present

## 2021-08-21 DIAGNOSIS — M6281 Muscle weakness (generalized): Secondary | ICD-10-CM | POA: Diagnosis not present

## 2021-08-22 DIAGNOSIS — N390 Urinary tract infection, site not specified: Secondary | ICD-10-CM | POA: Diagnosis not present

## 2021-08-22 DIAGNOSIS — M6281 Muscle weakness (generalized): Secondary | ICD-10-CM | POA: Diagnosis not present

## 2021-08-28 DIAGNOSIS — I10 Essential (primary) hypertension: Secondary | ICD-10-CM | POA: Diagnosis not present

## 2021-08-28 DIAGNOSIS — Z8673 Personal history of transient ischemic attack (TIA), and cerebral infarction without residual deficits: Secondary | ICD-10-CM | POA: Diagnosis not present

## 2021-09-16 DIAGNOSIS — I10 Essential (primary) hypertension: Secondary | ICD-10-CM | POA: Diagnosis not present

## 2021-09-16 DIAGNOSIS — I639 Cerebral infarction, unspecified: Secondary | ICD-10-CM | POA: Diagnosis not present

## 2021-09-16 DIAGNOSIS — E785 Hyperlipidemia, unspecified: Secondary | ICD-10-CM | POA: Diagnosis not present

## 2021-09-18 DIAGNOSIS — H5213 Myopia, bilateral: Secondary | ICD-10-CM | POA: Diagnosis not present

## 2021-09-25 DIAGNOSIS — E785 Hyperlipidemia, unspecified: Secondary | ICD-10-CM | POA: Diagnosis not present

## 2021-09-25 DIAGNOSIS — Z8673 Personal history of transient ischemic attack (TIA), and cerebral infarction without residual deficits: Secondary | ICD-10-CM | POA: Diagnosis not present

## 2021-10-01 ENCOUNTER — Other Ambulatory Visit (HOSPITAL_COMMUNITY): Payer: Self-pay | Admitting: Interventional Radiology

## 2021-10-01 ENCOUNTER — Telehealth (HOSPITAL_COMMUNITY): Payer: Self-pay

## 2021-10-01 DIAGNOSIS — I639 Cerebral infarction, unspecified: Secondary | ICD-10-CM

## 2021-10-01 NOTE — Telephone Encounter (Signed)
Called to schedule cta, no answer, left vm. AW  

## 2021-10-16 DIAGNOSIS — Z8673 Personal history of transient ischemic attack (TIA), and cerebral infarction without residual deficits: Secondary | ICD-10-CM | POA: Diagnosis not present

## 2021-10-16 DIAGNOSIS — I10 Essential (primary) hypertension: Secondary | ICD-10-CM | POA: Diagnosis not present

## 2021-10-30 DIAGNOSIS — M79675 Pain in left toe(s): Secondary | ICD-10-CM | POA: Diagnosis not present

## 2021-10-30 DIAGNOSIS — M79674 Pain in right toe(s): Secondary | ICD-10-CM | POA: Diagnosis not present

## 2021-10-30 DIAGNOSIS — B351 Tinea unguium: Secondary | ICD-10-CM | POA: Diagnosis not present

## 2021-11-15 DIAGNOSIS — I10 Essential (primary) hypertension: Secondary | ICD-10-CM | POA: Diagnosis not present

## 2021-11-15 DIAGNOSIS — E785 Hyperlipidemia, unspecified: Secondary | ICD-10-CM | POA: Diagnosis not present

## 2021-11-18 DIAGNOSIS — I6789 Other cerebrovascular disease: Secondary | ICD-10-CM | POA: Diagnosis not present

## 2021-11-18 DIAGNOSIS — E785 Hyperlipidemia, unspecified: Secondary | ICD-10-CM | POA: Diagnosis not present

## 2021-11-18 DIAGNOSIS — I10 Essential (primary) hypertension: Secondary | ICD-10-CM | POA: Diagnosis not present

## 2021-11-22 DIAGNOSIS — N390 Urinary tract infection, site not specified: Secondary | ICD-10-CM | POA: Diagnosis not present

## 2021-12-10 DIAGNOSIS — Z79899 Other long term (current) drug therapy: Secondary | ICD-10-CM | POA: Diagnosis not present

## 2021-12-11 DIAGNOSIS — Z8673 Personal history of transient ischemic attack (TIA), and cerebral infarction without residual deficits: Secondary | ICD-10-CM | POA: Diagnosis not present

## 2021-12-16 DIAGNOSIS — Z8673 Personal history of transient ischemic attack (TIA), and cerebral infarction without residual deficits: Secondary | ICD-10-CM | POA: Diagnosis not present

## 2021-12-17 DIAGNOSIS — Z8673 Personal history of transient ischemic attack (TIA), and cerebral infarction without residual deficits: Secondary | ICD-10-CM | POA: Diagnosis not present

## 2021-12-18 DIAGNOSIS — Z8673 Personal history of transient ischemic attack (TIA), and cerebral infarction without residual deficits: Secondary | ICD-10-CM | POA: Diagnosis not present

## 2021-12-18 DIAGNOSIS — I10 Essential (primary) hypertension: Secondary | ICD-10-CM | POA: Diagnosis not present

## 2021-12-19 DIAGNOSIS — Z8673 Personal history of transient ischemic attack (TIA), and cerebral infarction without residual deficits: Secondary | ICD-10-CM | POA: Diagnosis not present

## 2021-12-22 DIAGNOSIS — Z8673 Personal history of transient ischemic attack (TIA), and cerebral infarction without residual deficits: Secondary | ICD-10-CM | POA: Diagnosis not present

## 2021-12-25 DIAGNOSIS — Z8673 Personal history of transient ischemic attack (TIA), and cerebral infarction without residual deficits: Secondary | ICD-10-CM | POA: Diagnosis not present

## 2021-12-28 DIAGNOSIS — Z8673 Personal history of transient ischemic attack (TIA), and cerebral infarction without residual deficits: Secondary | ICD-10-CM | POA: Diagnosis not present

## 2022-01-06 DIAGNOSIS — Z8673 Personal history of transient ischemic attack (TIA), and cerebral infarction without residual deficits: Secondary | ICD-10-CM | POA: Diagnosis not present

## 2022-01-07 DIAGNOSIS — Z8673 Personal history of transient ischemic attack (TIA), and cerebral infarction without residual deficits: Secondary | ICD-10-CM | POA: Diagnosis not present

## 2022-01-08 DIAGNOSIS — Z8673 Personal history of transient ischemic attack (TIA), and cerebral infarction without residual deficits: Secondary | ICD-10-CM | POA: Diagnosis not present

## 2022-01-09 DIAGNOSIS — Z8673 Personal history of transient ischemic attack (TIA), and cerebral infarction without residual deficits: Secondary | ICD-10-CM | POA: Diagnosis not present

## 2022-01-10 DIAGNOSIS — Z8673 Personal history of transient ischemic attack (TIA), and cerebral infarction without residual deficits: Secondary | ICD-10-CM | POA: Diagnosis not present

## 2022-01-15 DIAGNOSIS — Z8673 Personal history of transient ischemic attack (TIA), and cerebral infarction without residual deficits: Secondary | ICD-10-CM | POA: Diagnosis not present

## 2022-01-16 DIAGNOSIS — Z8673 Personal history of transient ischemic attack (TIA), and cerebral infarction without residual deficits: Secondary | ICD-10-CM | POA: Diagnosis not present

## 2022-01-18 DIAGNOSIS — E785 Hyperlipidemia, unspecified: Secondary | ICD-10-CM | POA: Diagnosis not present

## 2022-01-18 DIAGNOSIS — I10 Essential (primary) hypertension: Secondary | ICD-10-CM | POA: Diagnosis not present

## 2022-01-21 DIAGNOSIS — I639 Cerebral infarction, unspecified: Secondary | ICD-10-CM | POA: Diagnosis not present

## 2022-01-21 DIAGNOSIS — Z8673 Personal history of transient ischemic attack (TIA), and cerebral infarction without residual deficits: Secondary | ICD-10-CM | POA: Diagnosis not present

## 2022-01-21 DIAGNOSIS — I10 Essential (primary) hypertension: Secondary | ICD-10-CM | POA: Diagnosis not present

## 2022-01-21 DIAGNOSIS — E785 Hyperlipidemia, unspecified: Secondary | ICD-10-CM | POA: Diagnosis not present

## 2022-01-22 DIAGNOSIS — M79675 Pain in left toe(s): Secondary | ICD-10-CM | POA: Diagnosis not present

## 2022-01-22 DIAGNOSIS — M79674 Pain in right toe(s): Secondary | ICD-10-CM | POA: Diagnosis not present

## 2022-01-22 DIAGNOSIS — Z8673 Personal history of transient ischemic attack (TIA), and cerebral infarction without residual deficits: Secondary | ICD-10-CM | POA: Diagnosis not present

## 2022-01-22 DIAGNOSIS — B351 Tinea unguium: Secondary | ICD-10-CM | POA: Diagnosis not present

## 2022-01-23 DIAGNOSIS — Z8673 Personal history of transient ischemic attack (TIA), and cerebral infarction without residual deficits: Secondary | ICD-10-CM | POA: Diagnosis not present

## 2022-01-24 DIAGNOSIS — Z8673 Personal history of transient ischemic attack (TIA), and cerebral infarction without residual deficits: Secondary | ICD-10-CM | POA: Diagnosis not present

## 2022-01-27 DIAGNOSIS — E08311 Diabetes mellitus due to underlying condition with unspecified diabetic retinopathy with macular edema: Secondary | ICD-10-CM | POA: Diagnosis not present

## 2022-01-27 DIAGNOSIS — Z8673 Personal history of transient ischemic attack (TIA), and cerebral infarction without residual deficits: Secondary | ICD-10-CM | POA: Diagnosis not present

## 2022-01-27 DIAGNOSIS — E038 Other specified hypothyroidism: Secondary | ICD-10-CM | POA: Diagnosis not present

## 2022-01-27 DIAGNOSIS — D464 Refractory anemia, unspecified: Secondary | ICD-10-CM | POA: Diagnosis not present

## 2022-01-27 DIAGNOSIS — E7849 Other hyperlipidemia: Secondary | ICD-10-CM | POA: Diagnosis not present

## 2022-01-29 DIAGNOSIS — E785 Hyperlipidemia, unspecified: Secondary | ICD-10-CM | POA: Diagnosis not present

## 2022-01-29 DIAGNOSIS — I639 Cerebral infarction, unspecified: Secondary | ICD-10-CM | POA: Diagnosis not present

## 2022-01-29 DIAGNOSIS — I10 Essential (primary) hypertension: Secondary | ICD-10-CM | POA: Diagnosis not present

## 2022-02-12 DIAGNOSIS — E785 Hyperlipidemia, unspecified: Secondary | ICD-10-CM | POA: Diagnosis not present

## 2022-02-12 DIAGNOSIS — I10 Essential (primary) hypertension: Secondary | ICD-10-CM | POA: Diagnosis not present

## 2022-03-19 DIAGNOSIS — M6281 Muscle weakness (generalized): Secondary | ICD-10-CM | POA: Diagnosis not present

## 2022-03-19 DIAGNOSIS — R2689 Other abnormalities of gait and mobility: Secondary | ICD-10-CM | POA: Diagnosis not present

## 2022-03-19 DIAGNOSIS — G459 Transient cerebral ischemic attack, unspecified: Secondary | ICD-10-CM | POA: Diagnosis not present

## 2022-03-19 DIAGNOSIS — E46 Unspecified protein-calorie malnutrition: Secondary | ICD-10-CM | POA: Diagnosis not present

## 2022-03-24 DIAGNOSIS — E785 Hyperlipidemia, unspecified: Secondary | ICD-10-CM | POA: Diagnosis not present

## 2022-03-24 DIAGNOSIS — I639 Cerebral infarction, unspecified: Secondary | ICD-10-CM | POA: Diagnosis not present

## 2022-03-24 DIAGNOSIS — I10 Essential (primary) hypertension: Secondary | ICD-10-CM | POA: Diagnosis not present

## 2022-03-31 DIAGNOSIS — E785 Hyperlipidemia, unspecified: Secondary | ICD-10-CM | POA: Diagnosis not present

## 2022-03-31 DIAGNOSIS — I10 Essential (primary) hypertension: Secondary | ICD-10-CM | POA: Diagnosis not present

## 2022-04-09 DIAGNOSIS — M79675 Pain in left toe(s): Secondary | ICD-10-CM | POA: Diagnosis not present

## 2022-04-09 DIAGNOSIS — M79674 Pain in right toe(s): Secondary | ICD-10-CM | POA: Diagnosis not present

## 2022-04-09 DIAGNOSIS — B351 Tinea unguium: Secondary | ICD-10-CM | POA: Diagnosis not present

## 2022-04-25 DIAGNOSIS — E785 Hyperlipidemia, unspecified: Secondary | ICD-10-CM | POA: Diagnosis not present

## 2022-04-25 DIAGNOSIS — I10 Essential (primary) hypertension: Secondary | ICD-10-CM | POA: Diagnosis not present

## 2022-05-26 DIAGNOSIS — E785 Hyperlipidemia, unspecified: Secondary | ICD-10-CM | POA: Diagnosis not present

## 2022-05-26 DIAGNOSIS — I10 Essential (primary) hypertension: Secondary | ICD-10-CM | POA: Diagnosis not present

## 2022-05-29 DIAGNOSIS — R220 Localized swelling, mass and lump, head: Secondary | ICD-10-CM | POA: Diagnosis not present

## 2022-05-29 DIAGNOSIS — I10 Essential (primary) hypertension: Secondary | ICD-10-CM | POA: Diagnosis not present

## 2022-05-29 DIAGNOSIS — I639 Cerebral infarction, unspecified: Secondary | ICD-10-CM | POA: Diagnosis not present

## 2022-05-29 DIAGNOSIS — E785 Hyperlipidemia, unspecified: Secondary | ICD-10-CM | POA: Diagnosis not present

## 2022-06-02 DIAGNOSIS — L739 Follicular disorder, unspecified: Secondary | ICD-10-CM | POA: Diagnosis not present

## 2022-06-02 DIAGNOSIS — R220 Localized swelling, mass and lump, head: Secondary | ICD-10-CM | POA: Diagnosis not present

## 2022-06-05 DIAGNOSIS — L0201 Cutaneous abscess of face: Secondary | ICD-10-CM | POA: Diagnosis not present

## 2022-06-06 DIAGNOSIS — T8130XA Disruption of wound, unspecified, initial encounter: Secondary | ICD-10-CM | POA: Diagnosis not present

## 2022-06-25 DIAGNOSIS — L720 Epidermal cyst: Secondary | ICD-10-CM | POA: Diagnosis not present

## 2022-06-25 DIAGNOSIS — Z7901 Long term (current) use of anticoagulants: Secondary | ICD-10-CM | POA: Diagnosis not present

## 2022-06-25 DIAGNOSIS — Z8673 Personal history of transient ischemic attack (TIA), and cerebral infarction without residual deficits: Secondary | ICD-10-CM | POA: Diagnosis not present

## 2022-07-03 DIAGNOSIS — I10 Essential (primary) hypertension: Secondary | ICD-10-CM | POA: Diagnosis not present

## 2022-07-03 DIAGNOSIS — E785 Hyperlipidemia, unspecified: Secondary | ICD-10-CM | POA: Diagnosis not present

## 2022-07-21 DIAGNOSIS — E785 Hyperlipidemia, unspecified: Secondary | ICD-10-CM | POA: Diagnosis not present

## 2022-07-21 DIAGNOSIS — I10 Essential (primary) hypertension: Secondary | ICD-10-CM | POA: Diagnosis not present

## 2022-07-28 DIAGNOSIS — I10 Essential (primary) hypertension: Secondary | ICD-10-CM | POA: Diagnosis not present

## 2022-07-28 DIAGNOSIS — E785 Hyperlipidemia, unspecified: Secondary | ICD-10-CM | POA: Diagnosis not present

## 2022-07-28 DIAGNOSIS — I6789 Other cerebrovascular disease: Secondary | ICD-10-CM | POA: Diagnosis not present

## 2022-08-11 DIAGNOSIS — I10 Essential (primary) hypertension: Secondary | ICD-10-CM | POA: Diagnosis not present

## 2022-08-11 DIAGNOSIS — E785 Hyperlipidemia, unspecified: Secondary | ICD-10-CM | POA: Diagnosis not present

## 2022-08-12 DIAGNOSIS — E569 Vitamin deficiency, unspecified: Secondary | ICD-10-CM | POA: Diagnosis not present

## 2022-08-12 DIAGNOSIS — D2330 Other benign neoplasm of skin of unspecified part of face: Secondary | ICD-10-CM | POA: Diagnosis not present

## 2022-08-12 DIAGNOSIS — I10 Essential (primary) hypertension: Secondary | ICD-10-CM | POA: Diagnosis not present

## 2022-08-20 ENCOUNTER — Other Ambulatory Visit: Payer: Self-pay | Admitting: Otolaryngology

## 2022-08-29 ENCOUNTER — Encounter (HOSPITAL_BASED_OUTPATIENT_CLINIC_OR_DEPARTMENT_OTHER): Payer: Self-pay | Admitting: Otolaryngology

## 2022-08-29 ENCOUNTER — Other Ambulatory Visit: Payer: Self-pay

## 2022-09-03 ENCOUNTER — Ambulatory Visit (HOSPITAL_BASED_OUTPATIENT_CLINIC_OR_DEPARTMENT_OTHER)
Admission: RE | Admit: 2022-09-03 | Discharge: 2022-09-03 | Disposition: A | Discharge status: Skilled Nursing Facility | Payer: 59 | Attending: Otolaryngology | Admitting: Otolaryngology

## 2022-09-03 ENCOUNTER — Encounter (HOSPITAL_BASED_OUTPATIENT_CLINIC_OR_DEPARTMENT_OTHER): Payer: Self-pay | Admitting: Anesthesiology

## 2022-09-03 ENCOUNTER — Encounter (HOSPITAL_BASED_OUTPATIENT_CLINIC_OR_DEPARTMENT_OTHER): Payer: Self-pay | Admitting: Otolaryngology

## 2022-09-03 ENCOUNTER — Other Ambulatory Visit: Payer: Self-pay

## 2022-09-03 ENCOUNTER — Encounter (HOSPITAL_BASED_OUTPATIENT_CLINIC_OR_DEPARTMENT_OTHER)
Admission: RE | Disposition: A | Discharge status: Skilled Nursing Facility | Payer: Self-pay | Source: Home / Self Care | Attending: Otolaryngology

## 2022-09-03 DIAGNOSIS — G8918 Other acute postprocedural pain: Secondary | ICD-10-CM

## 2022-09-03 DIAGNOSIS — L720 Epidermal cyst: Secondary | ICD-10-CM | POA: Insufficient documentation

## 2022-09-03 HISTORY — DX: Benign prostatic hyperplasia without lower urinary tract symptoms: N40.0

## 2022-09-03 HISTORY — PX: MASS EXCISION: SHX2000

## 2022-09-03 SURGERY — MINOR EXCISION OF MASS
Anesthesia: LOCAL | Site: Face | Laterality: Left

## 2022-09-03 MED ORDER — CEFAZOLIN SODIUM-DEXTROSE 2-4 GM/100ML-% IV SOLN: 2.0000 g | INTRAVENOUS | Status: DC

## 2022-09-03 MED ORDER — 0.9 % SODIUM CHLORIDE (POUR BTL) OPTIME
TOPICAL | Status: DC | PRN
  Administered 2022-09-03: 200 mL

## 2022-09-03 MED ORDER — HYDROCODONE-ACETAMINOPHEN 5-325 MG PO TABS: 1.0000 | ORAL_TABLET | Freq: Four times a day (QID) | ORAL | 0 refills | Status: AC | PRN

## 2022-09-03 MED ORDER — SODIUM BICARBONATE 4.2 % IV SOLN
INTRAVENOUS | Status: AC
  Filled 2022-09-03: qty 10

## 2022-09-03 MED ORDER — SODIUM BICARBONATE 4.2 % IV SOLN
INTRAVENOUS | Status: DC | PRN
  Administered 2022-09-03: 2 mL via INTRAVENOUS

## 2022-09-03 MED ORDER — BACITRACIN ZINC 500 UNIT/GM EX OINT
TOPICAL_OINTMENT | CUTANEOUS | Status: DC | PRN
  Administered 2022-09-03: 1 via TOPICAL

## 2022-09-03 MED ORDER — DOXYCYCLINE HYCLATE 100 MG PO TABS: 100.0000 mg | ORAL_TABLET | Freq: Two times a day (BID) | ORAL | 0 refills | Status: AC

## 2022-09-03 MED ORDER — LIDOCAINE-EPINEPHRINE 1 %-1:100000 IJ SOLN
INTRAMUSCULAR | Status: DC | PRN
  Administered 2022-09-03: 18 mL

## 2022-09-03 SURGICAL SUPPLY — 57 items
ADH SKN CLS APL DERMABOND .7 (GAUZE/BANDAGES/DRESSINGS)
APL SRG 3 HI ABS STRL LF PLS (MISCELLANEOUS) ×4
APPLICATOR DR MATTHEWS STRL (MISCELLANEOUS) ×3 IMPLANT
BLADE SURG 15 STRL LF DISP TIS (BLADE) ×2 IMPLANT
BLADE SURG 15 STRL SS (BLADE) ×2
CANISTER SUCT 1200ML W/VALVE (MISCELLANEOUS) ×2 IMPLANT
CORD BIPOLAR FORCEPS 12FT (ELECTRODE) IMPLANT
COVER BACK TABLE 60X90IN (DRAPES) ×2 IMPLANT
COVER MAYO STAND STRL (DRAPES) ×2 IMPLANT
DERMABOND ADVANCED .7 DNX12 (GAUZE/BANDAGES/DRESSINGS) IMPLANT
DRAPE U-SHAPE 76X120 STRL (DRAPES) ×2 IMPLANT
DRSG TELFA 3X8 NADH STRL (GAUZE/BANDAGES/DRESSINGS) ×1 IMPLANT
ELECT COATED BLADE 2.86 ST (ELECTRODE) ×1 IMPLANT
ELECT NDL BLADE 2-5/6 (NEEDLE) IMPLANT
ELECT NEEDLE BLADE 2-5/6 (NEEDLE) IMPLANT
ELECT REM PT RETURN 9FT ADLT (ELECTROSURGICAL) ×2
ELECTRODE REM PT RTRN 9FT ADLT (ELECTROSURGICAL) ×2 IMPLANT
FORCEPS BIPOLAR SPETZLER 8 1.0 (NEUROSURGERY SUPPLIES) IMPLANT
GAUZE 4X4 16PLY ~~LOC~~+RFID DBL (SPONGE) ×1 IMPLANT
GAUZE SPONGE 2X2 STRL 8-PLY (GAUZE/BANDAGES/DRESSINGS) IMPLANT
GAUZE SPONGE 4X4 12PLY STRL LF (GAUZE/BANDAGES/DRESSINGS) IMPLANT
GLOVE BIO SURGEON STRL SZ7.5 (GLOVE) ×2 IMPLANT
GLOVE BIOGEL PI IND STRL 7.0 (GLOVE) ×3 IMPLANT
GLOVE BIOGEL PI IND STRL 8 (GLOVE) ×2 IMPLANT
GLOVE SURG SS PI 7.0 STRL IVOR (GLOVE) ×1 IMPLANT
GOWN STRL REUS W/ TWL LRG LVL3 (GOWN DISPOSABLE) ×2 IMPLANT
GOWN STRL REUS W/ TWL XL LVL3 (GOWN DISPOSABLE) ×2 IMPLANT
GOWN STRL REUS W/TWL LRG LVL3 (GOWN DISPOSABLE) ×2
GOWN STRL REUS W/TWL XL LVL3 (GOWN DISPOSABLE) ×2
MARKER SKIN DUAL TIP RULER LAB (MISCELLANEOUS) ×1 IMPLANT
NDL HYPO 27GX1-1/4 (NEEDLE) ×1 IMPLANT
NEEDLE HYPO 27GX1-1/4 (NEEDLE) ×4 IMPLANT
NS IRRIG 1000ML POUR BTL (IV SOLUTION) ×2 IMPLANT
PACK BASIN DAY SURGERY FS (CUSTOM PROCEDURE TRAY) ×2 IMPLANT
PENCIL SMOKE EVACUATOR (MISCELLANEOUS) ×2 IMPLANT
SHEET MEDIUM DRAPE 40X70 STRL (DRAPES) IMPLANT
SLEEVE SCD COMPRESS KNEE MED (STOCKING) IMPLANT
SPIKE FLUID TRANSFER (MISCELLANEOUS) IMPLANT
SUCTION FRAZIER HANDLE 10FR (MISCELLANEOUS) ×2
SUCTION TUBE FRAZIER 10FR DISP (MISCELLANEOUS) ×1 IMPLANT
SUT ETHILON 5 0 PS 2 18 (SUTURE) ×2 IMPLANT
SUT MNCRL AB 4-0 PS2 18 (SUTURE) ×1 IMPLANT
SUT NYLON ETHILON 5-0 P-3 1X18 (SUTURE) IMPLANT
SUT PROLENE 4 0 P 3 18 (SUTURE) IMPLANT
SUT VIC AB 3-0 PS2 18 (SUTURE) ×1 IMPLANT
SUT VIC AB 4-0 P-3 18XBRD (SUTURE) IMPLANT
SUT VIC AB 4-0 P3 18 (SUTURE)
SUT VIC AB 4-0 PS2 18 (SUTURE) IMPLANT
SUT VIC AB 4-0 PS2 27 (SUTURE) IMPLANT
SWAB COLLECTION DEVICE MRSA (MISCELLANEOUS) ×1 IMPLANT
SWAB CULTURE ESWAB REG 1ML (MISCELLANEOUS) ×1 IMPLANT
SYR BULB EAR ULCER 3OZ GRN STR (SYRINGE) ×2 IMPLANT
SYR CONTROL 10ML LL (SYRINGE) ×3 IMPLANT
TOWEL GREEN STERILE FF (TOWEL DISPOSABLE) ×2 IMPLANT
TRAY DSU PREP LF (CUSTOM PROCEDURE TRAY) ×2 IMPLANT
TUBE CONNECTING 20X1/4 (TUBING) ×2 IMPLANT
YANKAUER SUCT BULB TIP NO VENT (SUCTIONS) IMPLANT

## 2022-09-03 NOTE — Discharge Instructions (Signed)
Post-operative Patient Instructions Andre [name] [name] MD  Surgery What to expect: A bandage will be placed on your surgical sites. You can leave the bandages in place until you return to the clinic. You may be scheduled for a series of wound care appointments over the next month.  Recovery/Restrictions: -No strenuous activity for at least the first week after your procedure -Bruising and swelling are expected and will take weeks to go away (consider using ice packs) -Please contact our office immediately if you experience any signs/symptoms of infection (redness, pain, or fever of 100.4F or greater)  Wound Wound care: The goal is to keep your wounds clean and moist to prevent scabs or crusts. You will keep your current post-operative dressing in place undisturbed until after your first post-operative clinic visit. The following instructions apply after your first post-operative visit.  1. Clean wound wounds with soap and water using a cue tip if any crusts are present  2. Next, apply a thin layer of Aquaphor ointment 3. Apply Telfa and cover with brown tape  4. Please apply a thin layer of antibiotic ointment or Aquaphor ointment to your ear incision every day  Care Healing Period: For the best healing, please protect the area from the sun   You may be asked to begin massaging the scars several weeks after surgery. Scars can be massaged in horizontal, vertical, and circular motions.   Adult Post-Operative Pain Management  Pain medication is given immediately following your surgery to help with post-operative pain. Do not wake up or set an alarm to wake up and take pain medications. Sleep and rest.  Upon your discharge home, we suggest scheduled doses of Acetaminophen (Tylenol) every 6 hours and Ibuprofen (Motrin) every 6 hours, alternating between medications every 3 hours (i.e. Take Tylenol and wait 3 hours, then take Motrin and wait 3 hours, repeat) for the first 3-4 days after  surgery. If you are without significant pain, medications can be taken more infrequently. It is important to follow dosing instructions on the medication bottle or prescription.   Sample of medication dosing schedule  Give dose of: Time: Given:  Acetaminophen 12 a.m.   Ibuprofen 3 a.m.   Acetaminophen 6 a.m.   Ibuprofen 9 a.m.   Acetaminophen 12 p.m.   Ibuprofen 3 p.m.   Acetaminophen 6 p.m.   Ibuprofen 9 p.m.    If you need to call after clinic hours for a concern, call [phone] and ask for the "physician on call for ENT."  [address] [place], [place] [address] Phone: [phone]   

## 2022-09-03 NOTE — Op Note (Signed)
OPERATIVE NOTE  Lanetta Inch Date/Time of Admission: 09/03/2022  9:00 AM  CSN: [id number];XBM:[id number] Attending Provider: Scarlette Ar, MD Room/Bed: [facility]SP/NONE DOB: 07-14-1950 Age: 72 y.o.   Pre-Op Diagnosis: Epidermal inclusion cyst  Post-Op Diagnosis: Epidermal inclusion cyst  Procedure: Excision of left facial sebaceous cysts with excision of skin 7x2cm Adjacent tissue transfer left neck 7x8cm (14041)  Anesthesia: LOCAL  Surgeon(s): Mervin Kung, MD  Staff: Circulator: Randalyn Rhea, RN Scrub Person: Verdie Drown  Implants: * No implants in log *  Specimens: ID Type Source Tests Collected by Time Destination  1 : Left Facial Cysts Tissue PATH Soft tissue SURGICAL PATHOLOGY Scarlette Ar, MD 09/03/2022 1151     Complications: none  EBL: 20 ML  IVF: none ML  Condition: stable  Operative Findings:  2 separate facial sebaceous cysts, one ruptured, the other ruptured, actively infected with fibrotic overlying skin adherent to capsule, gross total excision of both without cyst rupture. Normal marginal mandibular nerve function post-procedure  Description of Operation: The patient was identified in the preoperative area and consent confirmed in the chart.  He was brought to the operating room by the Nurse and preoperative huddle was performed confirming patient identity and procedure to be performed.  Once all were in agreement we proceeded with surgery under local anesthetic.  The left facial cyst with fibrotic infected overlying skin was marked out approximately 7 x 2 cm fusiform skin excision on the face/neck junction.  Local anesthetic 1% lidocaine 1 100,000 epinephrine mixed with bicarbonate was instilled into the area approximately 13 cc total.  The patient was prepped and draped in standard sterile fashion for procedure of this kind.  A final preoperative pause was performed and we proceeded with surgery.  15 blade was used to make the  fusiform incision taking care to avoid violating the cyst capsule and including the ruptured draining cyst area.  Double-pronged skin hooks were used and curved dissecting scissors were used to dissect out the cyst from the subcutaneous layer of the face staying above the level of the facial nerve.  The the masses were circumferentially excised with a combination of sharp dissection and monopolar cautery.  Bleeding was controlled with monopolar cautery.  After the masses were excised and sent for permanent pathology there was tension on the wound defect requiring adjacent tissue transfer of the left neck.  Double-pronged skin hooks were applied and adjacent tissue transfer with a inferiorly based advancement flap of the neck was created with sharp dissection in a subcutaneous plane approximately 8 x 7 cm.  Additional dissection in the buccal cheek area was performed as well.  After adequate tension-free closure and hemostasis was confirmed we then proceeded with layered closure.  The inferiorly based neck advancement flap was closed with buried interrupted 3-0 Vicryl and 4-0 Monocryl deep dermal sutures.  Interrupted 5-0 nylon sutures were placed.  The wounds were cleansed and dressed with bacitracin, Telfa and brown paper tape.  The patient was then transferred back to the recovery room in stable condition.  All counts were correct and final.  Postoperative analgesics and antibiotics were sent to the patient's pharmacy.  He will follow-up in 6 days for a wound check and suture removal.    Mervin Kung, MD [facility]  09/03/2022
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
## 2022-09-04 ENCOUNTER — Encounter (HOSPITAL_BASED_OUTPATIENT_CLINIC_OR_DEPARTMENT_OTHER): Payer: Self-pay | Admitting: Otolaryngology

## 2022-09-04 LAB — SURGICAL PATHOLOGY

## 2022-09-05 DIAGNOSIS — I679 Cerebrovascular disease, unspecified: Secondary | ICD-10-CM | POA: Diagnosis not present

## 2022-09-05 DIAGNOSIS — M6281 Muscle weakness (generalized): Secondary | ICD-10-CM | POA: Diagnosis not present

## 2022-09-08 DIAGNOSIS — I679 Cerebrovascular disease, unspecified: Secondary | ICD-10-CM | POA: Diagnosis not present

## 2022-09-08 DIAGNOSIS — M6281 Muscle weakness (generalized): Secondary | ICD-10-CM | POA: Diagnosis not present

## 2022-09-09 DIAGNOSIS — Z8673 Personal history of transient ischemic attack (TIA), and cerebral infarction without residual deficits: Secondary | ICD-10-CM | POA: Diagnosis not present

## 2022-09-09 DIAGNOSIS — I679 Cerebrovascular disease, unspecified: Secondary | ICD-10-CM | POA: Diagnosis not present

## 2022-09-09 DIAGNOSIS — Z7901 Long term (current) use of anticoagulants: Secondary | ICD-10-CM | POA: Diagnosis not present

## 2022-09-09 DIAGNOSIS — M6281 Muscle weakness (generalized): Secondary | ICD-10-CM | POA: Diagnosis not present

## 2022-09-09 DIAGNOSIS — L720 Epidermal cyst: Secondary | ICD-10-CM | POA: Diagnosis not present

## 2022-09-10 DIAGNOSIS — I679 Cerebrovascular disease, unspecified: Secondary | ICD-10-CM | POA: Diagnosis not present

## 2022-09-10 DIAGNOSIS — B351 Tinea unguium: Secondary | ICD-10-CM | POA: Diagnosis not present

## 2022-09-10 DIAGNOSIS — M79674 Pain in right toe(s): Secondary | ICD-10-CM | POA: Diagnosis not present

## 2022-09-10 DIAGNOSIS — M6281 Muscle weakness (generalized): Secondary | ICD-10-CM | POA: Diagnosis not present

## 2022-09-10 DIAGNOSIS — M79675 Pain in left toe(s): Secondary | ICD-10-CM | POA: Diagnosis not present

## 2022-09-11 DIAGNOSIS — M6281 Muscle weakness (generalized): Secondary | ICD-10-CM | POA: Diagnosis not present

## 2022-09-11 DIAGNOSIS — I679 Cerebrovascular disease, unspecified: Secondary | ICD-10-CM | POA: Diagnosis not present

## 2022-09-12 DIAGNOSIS — I679 Cerebrovascular disease, unspecified: Secondary | ICD-10-CM | POA: Diagnosis not present

## 2022-09-12 DIAGNOSIS — M6281 Muscle weakness (generalized): Secondary | ICD-10-CM | POA: Diagnosis not present

## 2022-09-13 DIAGNOSIS — M6281 Muscle weakness (generalized): Secondary | ICD-10-CM | POA: Diagnosis not present

## 2022-09-13 DIAGNOSIS — I679 Cerebrovascular disease, unspecified: Secondary | ICD-10-CM | POA: Diagnosis not present

## 2022-09-15 DIAGNOSIS — M6281 Muscle weakness (generalized): Secondary | ICD-10-CM | POA: Diagnosis not present

## 2022-09-15 DIAGNOSIS — I679 Cerebrovascular disease, unspecified: Secondary | ICD-10-CM | POA: Diagnosis not present

## 2022-09-17 DIAGNOSIS — M6281 Muscle weakness (generalized): Secondary | ICD-10-CM | POA: Diagnosis not present

## 2022-09-17 DIAGNOSIS — I10 Essential (primary) hypertension: Secondary | ICD-10-CM | POA: Diagnosis not present

## 2022-09-17 DIAGNOSIS — I679 Cerebrovascular disease, unspecified: Secondary | ICD-10-CM | POA: Diagnosis not present

## 2022-09-17 DIAGNOSIS — E785 Hyperlipidemia, unspecified: Secondary | ICD-10-CM | POA: Diagnosis not present

## 2022-09-18 DIAGNOSIS — I679 Cerebrovascular disease, unspecified: Secondary | ICD-10-CM | POA: Diagnosis not present

## 2022-09-18 DIAGNOSIS — M6281 Muscle weakness (generalized): Secondary | ICD-10-CM | POA: Diagnosis not present

## 2022-09-19 DIAGNOSIS — M6281 Muscle weakness (generalized): Secondary | ICD-10-CM | POA: Diagnosis not present

## 2022-09-19 DIAGNOSIS — I679 Cerebrovascular disease, unspecified: Secondary | ICD-10-CM | POA: Diagnosis not present

## 2022-09-20 DIAGNOSIS — I679 Cerebrovascular disease, unspecified: Secondary | ICD-10-CM | POA: Diagnosis not present

## 2022-09-20 DIAGNOSIS — M6281 Muscle weakness (generalized): Secondary | ICD-10-CM | POA: Diagnosis not present

## 2022-09-23 DIAGNOSIS — M6281 Muscle weakness (generalized): Secondary | ICD-10-CM | POA: Diagnosis not present

## 2022-09-23 DIAGNOSIS — I679 Cerebrovascular disease, unspecified: Secondary | ICD-10-CM | POA: Diagnosis not present

## 2022-09-24 DIAGNOSIS — I679 Cerebrovascular disease, unspecified: Secondary | ICD-10-CM | POA: Diagnosis not present

## 2022-09-24 DIAGNOSIS — M6281 Muscle weakness (generalized): Secondary | ICD-10-CM | POA: Diagnosis not present

## 2022-09-25 DIAGNOSIS — E785 Hyperlipidemia, unspecified: Secondary | ICD-10-CM | POA: Diagnosis not present

## 2022-09-25 DIAGNOSIS — M6281 Muscle weakness (generalized): Secondary | ICD-10-CM | POA: Diagnosis not present

## 2022-09-25 DIAGNOSIS — I10 Essential (primary) hypertension: Secondary | ICD-10-CM | POA: Diagnosis not present

## 2022-09-25 DIAGNOSIS — I639 Cerebral infarction, unspecified: Secondary | ICD-10-CM | POA: Diagnosis not present

## 2022-09-25 DIAGNOSIS — K5900 Constipation, unspecified: Secondary | ICD-10-CM | POA: Diagnosis not present

## 2022-09-25 DIAGNOSIS — I679 Cerebrovascular disease, unspecified: Secondary | ICD-10-CM | POA: Diagnosis not present

## 2022-09-26 DIAGNOSIS — M6281 Muscle weakness (generalized): Secondary | ICD-10-CM | POA: Diagnosis not present

## 2022-09-26 DIAGNOSIS — I679 Cerebrovascular disease, unspecified: Secondary | ICD-10-CM | POA: Diagnosis not present

## 2022-09-27 DIAGNOSIS — I679 Cerebrovascular disease, unspecified: Secondary | ICD-10-CM | POA: Diagnosis not present

## 2022-09-27 DIAGNOSIS — M6281 Muscle weakness (generalized): Secondary | ICD-10-CM | POA: Diagnosis not present

## 2022-09-30 DIAGNOSIS — I679 Cerebrovascular disease, unspecified: Secondary | ICD-10-CM | POA: Diagnosis not present

## 2022-09-30 DIAGNOSIS — M6281 Muscle weakness (generalized): Secondary | ICD-10-CM | POA: Diagnosis not present

## 2022-10-01 DIAGNOSIS — M6281 Muscle weakness (generalized): Secondary | ICD-10-CM | POA: Diagnosis not present

## 2022-10-01 DIAGNOSIS — I679 Cerebrovascular disease, unspecified: Secondary | ICD-10-CM | POA: Diagnosis not present

## 2022-10-02 DIAGNOSIS — I679 Cerebrovascular disease, unspecified: Secondary | ICD-10-CM | POA: Diagnosis not present

## 2022-10-02 DIAGNOSIS — M6281 Muscle weakness (generalized): Secondary | ICD-10-CM | POA: Diagnosis not present

## 2022-10-03 DIAGNOSIS — M6281 Muscle weakness (generalized): Secondary | ICD-10-CM | POA: Diagnosis not present

## 2022-10-03 DIAGNOSIS — I679 Cerebrovascular disease, unspecified: Secondary | ICD-10-CM | POA: Diagnosis not present

## 2022-10-04 DIAGNOSIS — M6281 Muscle weakness (generalized): Secondary | ICD-10-CM | POA: Diagnosis not present

## 2022-10-04 DIAGNOSIS — I679 Cerebrovascular disease, unspecified: Secondary | ICD-10-CM | POA: Diagnosis not present

## 2022-10-07 DIAGNOSIS — Z8673 Personal history of transient ischemic attack (TIA), and cerebral infarction without residual deficits: Secondary | ICD-10-CM | POA: Diagnosis not present

## 2022-10-07 DIAGNOSIS — L720 Epidermal cyst: Secondary | ICD-10-CM | POA: Diagnosis not present

## 2022-10-07 DIAGNOSIS — Z7901 Long term (current) use of anticoagulants: Secondary | ICD-10-CM | POA: Diagnosis not present

## 2022-10-07 DIAGNOSIS — M6281 Muscle weakness (generalized): Secondary | ICD-10-CM | POA: Diagnosis not present

## 2022-10-07 DIAGNOSIS — I679 Cerebrovascular disease, unspecified: Secondary | ICD-10-CM | POA: Diagnosis not present

## 2022-10-08 DIAGNOSIS — M6281 Muscle weakness (generalized): Secondary | ICD-10-CM | POA: Diagnosis not present

## 2022-10-08 DIAGNOSIS — I679 Cerebrovascular disease, unspecified: Secondary | ICD-10-CM | POA: Diagnosis not present

## 2022-10-09 DIAGNOSIS — M6281 Muscle weakness (generalized): Secondary | ICD-10-CM | POA: Diagnosis not present

## 2022-10-09 DIAGNOSIS — I679 Cerebrovascular disease, unspecified: Secondary | ICD-10-CM | POA: Diagnosis not present

## 2022-10-10 DIAGNOSIS — I679 Cerebrovascular disease, unspecified: Secondary | ICD-10-CM | POA: Diagnosis not present

## 2022-10-10 DIAGNOSIS — I10 Essential (primary) hypertension: Secondary | ICD-10-CM | POA: Diagnosis not present

## 2022-10-10 DIAGNOSIS — M6281 Muscle weakness (generalized): Secondary | ICD-10-CM | POA: Diagnosis not present

## 2022-10-10 DIAGNOSIS — E785 Hyperlipidemia, unspecified: Secondary | ICD-10-CM | POA: Diagnosis not present

## 2022-10-11 DIAGNOSIS — M6281 Muscle weakness (generalized): Secondary | ICD-10-CM | POA: Diagnosis not present

## 2022-10-11 DIAGNOSIS — I679 Cerebrovascular disease, unspecified: Secondary | ICD-10-CM | POA: Diagnosis not present

## 2022-10-14 DIAGNOSIS — M6281 Muscle weakness (generalized): Secondary | ICD-10-CM | POA: Diagnosis not present

## 2022-10-14 DIAGNOSIS — I679 Cerebrovascular disease, unspecified: Secondary | ICD-10-CM | POA: Diagnosis not present

## 2022-10-15 DIAGNOSIS — I679 Cerebrovascular disease, unspecified: Secondary | ICD-10-CM | POA: Diagnosis not present

## 2022-10-15 DIAGNOSIS — M6281 Muscle weakness (generalized): Secondary | ICD-10-CM | POA: Diagnosis not present

## 2022-10-16 DIAGNOSIS — M6281 Muscle weakness (generalized): Secondary | ICD-10-CM | POA: Diagnosis not present

## 2022-10-16 DIAGNOSIS — I679 Cerebrovascular disease, unspecified: Secondary | ICD-10-CM | POA: Diagnosis not present

## 2022-10-18 DIAGNOSIS — M6281 Muscle weakness (generalized): Secondary | ICD-10-CM | POA: Diagnosis not present

## 2022-10-18 DIAGNOSIS — I679 Cerebrovascular disease, unspecified: Secondary | ICD-10-CM | POA: Diagnosis not present

## 2022-10-21 DIAGNOSIS — I679 Cerebrovascular disease, unspecified: Secondary | ICD-10-CM | POA: Diagnosis not present

## 2022-10-21 DIAGNOSIS — M6281 Muscle weakness (generalized): Secondary | ICD-10-CM | POA: Diagnosis not present

## 2022-10-22 DIAGNOSIS — M6281 Muscle weakness (generalized): Secondary | ICD-10-CM | POA: Diagnosis not present

## 2022-10-22 DIAGNOSIS — I679 Cerebrovascular disease, unspecified: Secondary | ICD-10-CM | POA: Diagnosis not present

## 2022-10-25 DIAGNOSIS — M6281 Muscle weakness (generalized): Secondary | ICD-10-CM | POA: Diagnosis not present

## 2022-10-25 DIAGNOSIS — I679 Cerebrovascular disease, unspecified: Secondary | ICD-10-CM | POA: Diagnosis not present

## 2022-10-28 DIAGNOSIS — I679 Cerebrovascular disease, unspecified: Secondary | ICD-10-CM | POA: Diagnosis not present

## 2022-10-28 DIAGNOSIS — M6281 Muscle weakness (generalized): Secondary | ICD-10-CM | POA: Diagnosis not present

## 2022-10-30 DIAGNOSIS — M6281 Muscle weakness (generalized): Secondary | ICD-10-CM | POA: Diagnosis not present

## 2022-10-30 DIAGNOSIS — I679 Cerebrovascular disease, unspecified: Secondary | ICD-10-CM | POA: Diagnosis not present

## 2022-10-31 DIAGNOSIS — M6281 Muscle weakness (generalized): Secondary | ICD-10-CM | POA: Diagnosis not present

## 2022-10-31 DIAGNOSIS — I679 Cerebrovascular disease, unspecified: Secondary | ICD-10-CM | POA: Diagnosis not present

## 2022-11-03 DIAGNOSIS — I679 Cerebrovascular disease, unspecified: Secondary | ICD-10-CM | POA: Diagnosis not present

## 2022-11-03 DIAGNOSIS — M6281 Muscle weakness (generalized): Secondary | ICD-10-CM | POA: Diagnosis not present

## 2022-11-05 DIAGNOSIS — M6281 Muscle weakness (generalized): Secondary | ICD-10-CM | POA: Diagnosis not present

## 2022-11-05 DIAGNOSIS — I679 Cerebrovascular disease, unspecified: Secondary | ICD-10-CM | POA: Diagnosis not present

## 2022-11-10 DIAGNOSIS — I10 Essential (primary) hypertension: Secondary | ICD-10-CM | POA: Diagnosis not present

## 2022-11-10 DIAGNOSIS — E785 Hyperlipidemia, unspecified: Secondary | ICD-10-CM | POA: Diagnosis not present

## 2022-11-11 DIAGNOSIS — I679 Cerebrovascular disease, unspecified: Secondary | ICD-10-CM | POA: Diagnosis not present

## 2022-11-11 DIAGNOSIS — M6281 Muscle weakness (generalized): Secondary | ICD-10-CM | POA: Diagnosis not present

## 2022-11-12 DIAGNOSIS — M6281 Muscle weakness (generalized): Secondary | ICD-10-CM | POA: Diagnosis not present

## 2022-11-12 DIAGNOSIS — I679 Cerebrovascular disease, unspecified: Secondary | ICD-10-CM | POA: Diagnosis not present

## 2022-11-13 DIAGNOSIS — I679 Cerebrovascular disease, unspecified: Secondary | ICD-10-CM | POA: Diagnosis not present

## 2022-11-13 DIAGNOSIS — M6281 Muscle weakness (generalized): Secondary | ICD-10-CM | POA: Diagnosis not present

## 2022-11-14 DIAGNOSIS — M6281 Muscle weakness (generalized): Secondary | ICD-10-CM | POA: Diagnosis not present

## 2022-11-14 DIAGNOSIS — I679 Cerebrovascular disease, unspecified: Secondary | ICD-10-CM | POA: Diagnosis not present

## 2022-11-19 DIAGNOSIS — M6281 Muscle weakness (generalized): Secondary | ICD-10-CM | POA: Diagnosis not present

## 2022-11-19 DIAGNOSIS — I679 Cerebrovascular disease, unspecified: Secondary | ICD-10-CM | POA: Diagnosis not present

## 2022-11-20 DIAGNOSIS — I679 Cerebrovascular disease, unspecified: Secondary | ICD-10-CM | POA: Diagnosis not present

## 2022-11-20 DIAGNOSIS — M6281 Muscle weakness (generalized): Secondary | ICD-10-CM | POA: Diagnosis not present

## 2022-11-21 DIAGNOSIS — I679 Cerebrovascular disease, unspecified: Secondary | ICD-10-CM | POA: Diagnosis not present

## 2022-11-21 DIAGNOSIS — M6281 Muscle weakness (generalized): Secondary | ICD-10-CM | POA: Diagnosis not present

## 2022-11-24 DIAGNOSIS — I679 Cerebrovascular disease, unspecified: Secondary | ICD-10-CM | POA: Diagnosis not present

## 2022-11-24 DIAGNOSIS — M6281 Muscle weakness (generalized): Secondary | ICD-10-CM | POA: Diagnosis not present

## 2022-11-25 DIAGNOSIS — M6281 Muscle weakness (generalized): Secondary | ICD-10-CM | POA: Diagnosis not present

## 2022-11-25 DIAGNOSIS — I679 Cerebrovascular disease, unspecified: Secondary | ICD-10-CM | POA: Diagnosis not present

## 2022-11-27 DIAGNOSIS — M79675 Pain in left toe(s): Secondary | ICD-10-CM | POA: Diagnosis not present

## 2022-11-27 DIAGNOSIS — M79674 Pain in right toe(s): Secondary | ICD-10-CM | POA: Diagnosis not present

## 2022-11-27 DIAGNOSIS — B351 Tinea unguium: Secondary | ICD-10-CM | POA: Diagnosis not present

## 2022-12-12 DIAGNOSIS — I679 Cerebrovascular disease, unspecified: Secondary | ICD-10-CM | POA: Diagnosis not present

## 2022-12-12 DIAGNOSIS — E782 Mixed hyperlipidemia: Secondary | ICD-10-CM | POA: Diagnosis not present

## 2022-12-12 DIAGNOSIS — I10 Essential (primary) hypertension: Secondary | ICD-10-CM | POA: Diagnosis not present

## 2023-01-02 DIAGNOSIS — E119 Type 2 diabetes mellitus without complications: Secondary | ICD-10-CM | POA: Diagnosis not present

## 2023-01-08 DIAGNOSIS — N3281 Overactive bladder: Secondary | ICD-10-CM | POA: Diagnosis not present

## 2023-01-08 DIAGNOSIS — E782 Mixed hyperlipidemia: Secondary | ICD-10-CM | POA: Diagnosis not present

## 2023-01-08 DIAGNOSIS — I10 Essential (primary) hypertension: Secondary | ICD-10-CM | POA: Diagnosis not present

## 2023-01-08 DIAGNOSIS — I679 Cerebrovascular disease, unspecified: Secondary | ICD-10-CM | POA: Diagnosis not present

## 2023-01-19 DIAGNOSIS — E782 Mixed hyperlipidemia: Secondary | ICD-10-CM | POA: Diagnosis not present

## 2023-01-19 DIAGNOSIS — I10 Essential (primary) hypertension: Secondary | ICD-10-CM | POA: Diagnosis not present

## 2023-01-19 DIAGNOSIS — I679 Cerebrovascular disease, unspecified: Secondary | ICD-10-CM | POA: Diagnosis not present

## 2023-02-11 DIAGNOSIS — M79675 Pain in left toe(s): Secondary | ICD-10-CM | POA: Diagnosis not present

## 2023-02-11 DIAGNOSIS — M79674 Pain in right toe(s): Secondary | ICD-10-CM | POA: Diagnosis not present

## 2023-02-11 DIAGNOSIS — B351 Tinea unguium: Secondary | ICD-10-CM | POA: Diagnosis not present

## 2023-02-13 DIAGNOSIS — J302 Other seasonal allergic rhinitis: Secondary | ICD-10-CM | POA: Diagnosis not present

## 2023-02-17 DIAGNOSIS — I679 Cerebrovascular disease, unspecified: Secondary | ICD-10-CM | POA: Diagnosis not present

## 2023-02-17 DIAGNOSIS — M6281 Muscle weakness (generalized): Secondary | ICD-10-CM | POA: Diagnosis not present

## 2023-02-17 DIAGNOSIS — R279 Unspecified lack of coordination: Secondary | ICD-10-CM | POA: Diagnosis not present

## 2023-02-19 DIAGNOSIS — M6281 Muscle weakness (generalized): Secondary | ICD-10-CM | POA: Diagnosis not present

## 2023-02-19 DIAGNOSIS — I679 Cerebrovascular disease, unspecified: Secondary | ICD-10-CM | POA: Diagnosis not present

## 2023-02-19 DIAGNOSIS — R279 Unspecified lack of coordination: Secondary | ICD-10-CM | POA: Diagnosis not present

## 2023-02-23 DIAGNOSIS — E782 Mixed hyperlipidemia: Secondary | ICD-10-CM | POA: Diagnosis not present

## 2023-02-23 DIAGNOSIS — I10 Essential (primary) hypertension: Secondary | ICD-10-CM | POA: Diagnosis not present

## 2023-02-23 DIAGNOSIS — M6281 Muscle weakness (generalized): Secondary | ICD-10-CM | POA: Diagnosis not present

## 2023-02-23 DIAGNOSIS — R279 Unspecified lack of coordination: Secondary | ICD-10-CM | POA: Diagnosis not present

## 2023-02-23 DIAGNOSIS — I679 Cerebrovascular disease, unspecified: Secondary | ICD-10-CM | POA: Diagnosis not present

## 2023-02-24 DIAGNOSIS — I679 Cerebrovascular disease, unspecified: Secondary | ICD-10-CM | POA: Diagnosis not present

## 2023-02-24 DIAGNOSIS — M6281 Muscle weakness (generalized): Secondary | ICD-10-CM | POA: Diagnosis not present

## 2023-02-24 DIAGNOSIS — R279 Unspecified lack of coordination: Secondary | ICD-10-CM | POA: Diagnosis not present

## 2023-02-25 DIAGNOSIS — M6281 Muscle weakness (generalized): Secondary | ICD-10-CM | POA: Diagnosis not present

## 2023-02-25 DIAGNOSIS — I679 Cerebrovascular disease, unspecified: Secondary | ICD-10-CM | POA: Diagnosis not present

## 2023-02-25 DIAGNOSIS — R279 Unspecified lack of coordination: Secondary | ICD-10-CM | POA: Diagnosis not present

## 2023-02-26 DIAGNOSIS — R279 Unspecified lack of coordination: Secondary | ICD-10-CM | POA: Diagnosis not present

## 2023-02-26 DIAGNOSIS — I679 Cerebrovascular disease, unspecified: Secondary | ICD-10-CM | POA: Diagnosis not present

## 2023-02-26 DIAGNOSIS — M6281 Muscle weakness (generalized): Secondary | ICD-10-CM | POA: Diagnosis not present

## 2023-02-27 DIAGNOSIS — I679 Cerebrovascular disease, unspecified: Secondary | ICD-10-CM | POA: Diagnosis not present

## 2023-02-27 DIAGNOSIS — M6281 Muscle weakness (generalized): Secondary | ICD-10-CM | POA: Diagnosis not present

## 2023-02-27 DIAGNOSIS — R279 Unspecified lack of coordination: Secondary | ICD-10-CM | POA: Diagnosis not present

## 2023-02-27 DIAGNOSIS — H1030 Unspecified acute conjunctivitis, unspecified eye: Secondary | ICD-10-CM | POA: Diagnosis not present

## 2023-02-28 DIAGNOSIS — M6281 Muscle weakness (generalized): Secondary | ICD-10-CM | POA: Diagnosis not present

## 2023-02-28 DIAGNOSIS — I679 Cerebrovascular disease, unspecified: Secondary | ICD-10-CM | POA: Diagnosis not present

## 2023-02-28 DIAGNOSIS — R279 Unspecified lack of coordination: Secondary | ICD-10-CM | POA: Diagnosis not present

## 2023-03-03 DIAGNOSIS — M6281 Muscle weakness (generalized): Secondary | ICD-10-CM | POA: Diagnosis not present

## 2023-03-03 DIAGNOSIS — R279 Unspecified lack of coordination: Secondary | ICD-10-CM | POA: Diagnosis not present

## 2023-03-03 DIAGNOSIS — I679 Cerebrovascular disease, unspecified: Secondary | ICD-10-CM | POA: Diagnosis not present

## 2023-03-04 DIAGNOSIS — I679 Cerebrovascular disease, unspecified: Secondary | ICD-10-CM | POA: Diagnosis not present

## 2023-03-04 DIAGNOSIS — R279 Unspecified lack of coordination: Secondary | ICD-10-CM | POA: Diagnosis not present

## 2023-03-04 DIAGNOSIS — M6281 Muscle weakness (generalized): Secondary | ICD-10-CM | POA: Diagnosis not present

## 2023-03-05 DIAGNOSIS — M6281 Muscle weakness (generalized): Secondary | ICD-10-CM | POA: Diagnosis not present

## 2023-03-05 DIAGNOSIS — R279 Unspecified lack of coordination: Secondary | ICD-10-CM | POA: Diagnosis not present

## 2023-03-05 DIAGNOSIS — I679 Cerebrovascular disease, unspecified: Secondary | ICD-10-CM | POA: Diagnosis not present

## 2023-06-08 ENCOUNTER — Encounter: Payer: Self-pay | Admitting: Family Medicine

## 2023-06-08 ENCOUNTER — Ambulatory Visit (INDEPENDENT_AMBULATORY_CARE_PROVIDER_SITE_OTHER): Payer: Medicare (Managed Care) | Admitting: Family Medicine

## 2023-06-08 VITALS — BP 138/70 | HR 103 | Ht 70.0 in | Wt 202.0 lb

## 2023-06-08 DIAGNOSIS — Z23 Encounter for immunization: Secondary | ICD-10-CM | POA: Diagnosis present

## 2023-06-08 DIAGNOSIS — Z8673 Personal history of transient ischemic attack (TIA), and cerebral infarction without residual deficits: Secondary | ICD-10-CM | POA: Diagnosis not present

## 2023-06-08 DIAGNOSIS — Z0000 Encounter for general adult medical examination without abnormal findings: Secondary | ICD-10-CM

## 2023-06-08 DIAGNOSIS — I10 Essential (primary) hypertension: Secondary | ICD-10-CM

## 2023-06-08 DIAGNOSIS — Z122 Encounter for screening for malignant neoplasm of respiratory organs: Secondary | ICD-10-CM

## 2023-06-08 DIAGNOSIS — Z136 Encounter for screening for cardiovascular disorders: Secondary | ICD-10-CM

## 2023-06-08 DIAGNOSIS — N400 Enlarged prostate without lower urinary tract symptoms: Secondary | ICD-10-CM | POA: Insufficient documentation

## 2023-06-08 NOTE — Assessment & Plan Note (Signed)
Goal <130/80, though close to well-controlled.  Will not adjust meds at this time and continue to follow. - Continue amlodipine 10 mg daily

## 2023-06-08 NOTE — Progress Notes (Cosign Needed Addendum)
   SUBJECTIVE:   CHIEF COMPLAINT / HPI:  Andre [name] is a 73 y.o. male presenting to the clinic to establish care and discuss new patient intake and preventive healthcare.  Concerns today: The patient is coming to establish care after leaving the [facility] on 06/06/2023, where he resided for nearly 2 years.  Patient will now be staying with his brother would like to establish care with [facility].  No acute concerns at this time.  PERTINENT PMH / PSH: Medical History Allergies: None Daily medications: Verified in medications tab Medical diagnoses: BPH, HLD, HTN, hx CVA (2022), former tobacco user Surgical hx: Gallbladder removed, ?some brain surgery after CVA (possibly for hemorrhagic stroke) Family hx: None significant COVID shot: October 2024 Flu vaccines: January 2025 PCV: Not sure Colonoscopy: Not sure about last colonoscopy or if he has ever had one, possibly in 1999 on record, but unable to verify in chart  Social History Prior PCP: [place] Occupation: Retired Lives with: Brother, waiting on Section 8 housing Smoking/Vaping: Quit smoking 2 years ago, smoked ~10 years, at least 2 ppd Alcohol: Quit drinking 2 years ago, used to drink a fair amount Marijuana/ilicit substances: None Exercise: Limited   OBJECTIVE:   BP 138/70   Pulse (!) 103   Ht 5\' 10"  (1.778 m)   Wt 202 lb (91.6 kg)   SpO2 94%   BMI 28.98 kg/m  HR 80 in room.  General: Age-appropriate, resting comfortably in chair, NAD, alert and at baseline. Cardiovascular: Regular rate and rhythm. Normal S1/S2. No murmurs, rubs, or gallops appreciated. 2+ radial pulses. Pulmonary: Clear bilaterally to ascultation. No increased WOB, no accessory muscle usage on room air. No wheezes, crackles, or rhonchi. Abdominal: No tenderness to deep or light palpation. No rebound or guarding. No HSM. Skin: Warm and dry.  No rashes grossly. Extremities: Trace peripheral edema bilaterally. Capillary refill <2  seconds. Neuro: Speaks with mild dysarthria.  Weakness and instability of gait, ambulates with cane (denies pain in knees or back).  Slow, shuffling gait.  Uses arms to stand from sitting position.  Appears mildly cognitively impaired in conversation.   ASSESSMENT/PLAN:   Assessment & Plan Well adult exam The patient is doing well overall, chronic conditions are well managed, and all of his medications have been updated.  Will take over care as PCP and focus on healthcare maintenance items in sequential fashion.  Next visit, plan to discuss Shingrix, colonscopy, and obtain BMP as well as lipid panel +/- hep C screening. STD screening: Not interested in screening at this time, though sexually active. Immunizations: PCV 20 administered today, verified Influenza and COVID vaccines, discuss Shingrix next visit. Lipid screening: deferred today, lab closed Colonoscopy: Last done never on record Advanced Directive: Deferred Lung cancer screening: Ordered today, CMA messaged to schedule AAA screening: Ordered today, CMA messaged to schedule Essential hypertension Goal <130/80, though close to well-controlled.  Will not adjust meds at this time and continue to follow. - Continue amlodipine 10 mg daily History of cardioembolic cerebrovascular accident (CVA) Currently on DAPT with clopidogrel and ASA 81 mg.  Will follow up in chart to determine if patient needs to continue DAPT or if monotherapy is appropriate at this time. - Discuss next visit in 3 months  Follow up in 3 months scheduled for April 24th.  Diannia Hogenson Sharion Dove, MD [facility]

## 2023-06-08 NOTE — Patient Instructions (Addendum)
It was great to see you today! Thank you for choosing [facility] for your primary care.  Today we addressed:  We recommend aortic aneurysm screening for males from the age of 44-75 who have ever smoked. Annual lung cancer screening with low-dose CT to adults aged 71 to 80 years, with a 20 pack-year smoking history, and who currently smoke or have quit within the last 15 years. We will schedule both of these screens, hopefully together, and you will get a call from our CMAs about that.  Thank you for getting the PCV vaccine, that reduces your risk of pneumonia.  You should return to our clinic in 3 months on April 24th.  Thank you for coming to see [place] at [facility] and for the opportunity to care for you! Sharion Dove, Tashiba Timoney, MD 06/08/2023, 5:04 PM

## 2023-06-08 NOTE — Addendum Note (Signed)
Addended by: Nelia Shi on: 06/08/2023 07:03 PM   Modules accepted: Orders

## 2023-06-09 NOTE — Addendum Note (Signed)
Addended by: Nelia Shi on: 06/09/2023 01:33 PM   Modules accepted: Orders

## 2023-06-10 ENCOUNTER — Telehealth: Payer: Self-pay

## 2023-06-10 NOTE — Telephone Encounter (Signed)
 Renne with [employer] calls nurse line in regards to CT authorization.   She reports the patient is no longer enrolled with [facility] effective 1/31.  Will forward to referral coordinator to update.

## 2023-06-11 ENCOUNTER — Other Ambulatory Visit: Payer: Self-pay | Admitting: Family Medicine

## 2023-06-11 ENCOUNTER — Telehealth: Payer: Self-pay | Admitting: Family Medicine

## 2023-06-11 DIAGNOSIS — Z122 Encounter for screening for malignant neoplasm of respiratory organs: Secondary | ICD-10-CM

## 2023-06-11 DIAGNOSIS — Z87891 Personal history of nicotine dependence: Secondary | ICD-10-CM

## 2023-06-11 NOTE — Telephone Encounter (Signed)
 Patient walked in to request refill of:  Name of Medication(s):  Folic Acid , Atorvastatin , Mirabegron , and Clopidogrel  Last date of OV:  06/08/23 Pharmacy:  [facility] [address] [facility] [address]  Will route refill request to [facility].  Discussed with patient policy to call pharmacy for future refills.  Also, discussed refills may take up to 48 hours to approve or deny.  Andre [name]

## 2023-06-11 NOTE — Progress Notes (Signed)
 Reordering low dose CT scan with different diagnosis linked per request from CT scheduler. Patient has a ~20 pack-year smoking history and is 73 years old.  Screening CT is indicated.

## 2023-06-12 ENCOUNTER — Other Ambulatory Visit: Payer: Self-pay | Admitting: Family Medicine

## 2023-06-12 ENCOUNTER — Telehealth: Payer: Self-pay

## 2023-06-12 MED ORDER — FOLIC ACID 1 MG PO TABS: 1.0000 mg | ORAL_TABLET | Freq: Every day | ORAL | 1 refills | Status: DC

## 2023-06-12 MED ORDER — MIRABEGRON ER 25 MG PO TB24: 25.0000 mg | ORAL_TABLET | Freq: Every day | ORAL | 2 refills | Status: DC

## 2023-06-12 MED ORDER — CLOPIDOGREL BISULFATE 75 MG PO TABS: 75.0000 mg | ORAL_TABLET | Freq: Every day | ORAL | 2 refills | Status: DC

## 2023-06-12 MED ORDER — ATORVASTATIN CALCIUM 80 MG PO TABS: 80.0000 mg | ORAL_TABLET | Freq: Every day | ORAL | 2 refills | Status: DC

## 2023-06-12 NOTE — Telephone Encounter (Signed)
 Called radiology and spoke with Taryn to schedule patient's appointment  Attempted to call patient to inform him about his upcoming CT appointment.   Left a generic VM for him to call the office back.  If/when patient calls back please inform him about his upcoming CT appointment located at [facility].  He would need to go through Entrance A and go to Radiology.  Appointment date and time is 06/19/2023 at 1pm  He would need to check in at 12:45pm.  There is free valet parking.  Harlene Reiter, CMA

## 2023-06-12 NOTE — Telephone Encounter (Signed)
 90 day refills ordered for folate, atorvastatin , mirabegron , and clopidogrel  to requested pharmacy. Patient recently seen 06/08/2023 and doses verified. Patient also on ASA 81 mg daily, will follow up on utility of longterm DAPT next appointment.

## 2023-06-15 ENCOUNTER — Other Ambulatory Visit (HOSPITAL_COMMUNITY): Payer: Self-pay

## 2023-06-19 ENCOUNTER — Ambulatory Visit (HOSPITAL_COMMUNITY): Payer: Medicare Other | Attending: Family Medicine

## 2023-06-23 ENCOUNTER — Encounter: Payer: Self-pay | Admitting: Family Medicine

## 2023-06-23 ENCOUNTER — Ambulatory Visit (INDEPENDENT_AMBULATORY_CARE_PROVIDER_SITE_OTHER): Payer: Medicare (Managed Care) | Admitting: Family Medicine

## 2023-06-23 VITALS — BP 132/77 | HR 80 | Ht 67.0 in | Wt 196.2 lb

## 2023-06-23 DIAGNOSIS — Z136 Encounter for screening for cardiovascular disorders: Secondary | ICD-10-CM

## 2023-06-23 DIAGNOSIS — Z79899 Other long term (current) drug therapy: Secondary | ICD-10-CM | POA: Diagnosis not present

## 2023-06-23 DIAGNOSIS — Z87891 Personal history of nicotine dependence: Secondary | ICD-10-CM | POA: Diagnosis not present

## 2023-06-23 DIAGNOSIS — Z122 Encounter for screening for malignant neoplasm of respiratory organs: Secondary | ICD-10-CM | POA: Diagnosis not present

## 2023-06-23 DIAGNOSIS — Z760 Encounter for issue of repeat prescription: Secondary | ICD-10-CM | POA: Diagnosis not present

## 2023-06-23 MED ORDER — TAMSULOSIN HCL 0.4 MG PO CAPS: 0.4000 mg | ORAL_CAPSULE | Freq: Every day | ORAL | 3 refills | Status: DC

## 2023-06-23 MED ORDER — AMLODIPINE BESYLATE 10 MG PO TABS: 10.0000 mg | ORAL_TABLET | Freq: Every day | ORAL | 1 refills | Status: DC

## 2023-06-23 NOTE — Patient Instructions (Signed)
It was great to see you today! Thank you for choosing [facility] for your primary care.  Today we addressed:  Medication refills You can always call our clinic and talk to our nurses and they will pass along information to me about refills in the future. I am sorry your mirabegron is so expensive.  I will message our pharmacy people to see if we can figure something out, but I cannot promise anything.  On your end, you can contact the Medicare office about adding on Part D coverage, which should be about $30/month or so, which will help.  If you do get Medicare Part D, I want you to call back the clinic and I will refill that medication again.  If your urinary symptoms start bothering you and you cannot hold your bladder at all, we can talk about other options, but I would rather avoid that right now.   Thank you for coming to see [place] at [facility] and for the opportunity to care for you! Javarus Dorner, MD 06/23/2023, 11:27 AM

## 2023-06-23 NOTE — Progress Notes (Unsigned)
   SUBJECTIVE:   CHIEF COMPLAINT / HPI:  Andre [name] is a 73 y.o. male with a pertinent past medical history of HTN, CVA, HLD, and BPH presenting to the clinic for medication management.  The patient was previously stable on mirabegron, but after I refilled it on 06/08/2023 upon patient establishing care at [facility], he was notified that the medication would be ~$500 at the pharmacy. Was previously getting this medication from some sort of pill pack pharmacy while living at [facility], has the empty pill pack with him this visit.  However, patient is unsure how this process worked for him previously. Denies current urinary incontinence.   PERTINENT PMH / PSH: Quit smoking 2 years ago, smoked ~10 years, at least 2 ppd  *Address changed to [address] from [address]   OBJECTIVE:   BP 132/77   Pulse 80   Ht 5\' 7"  (1.702 m)   Wt 196 lb 3.2 oz (89 kg)   SpO2 99%   BMI 30.73 kg/m   General: Appears older than stated age, resting comfortably in chair, NAD, alert and at baseline. Cardiovascular: Regular rate and rhythm. Normal S1/S2. No murmurs, rubs, or gallops appreciated. 2+ radial pulses. Pulmonary: Clear bilaterally to ascultation. No wheezes, crackles, or rhonchi. No increased WOB, no accessory muscle usage on room air. Extremities: No peripheral edema bilaterally.   ASSESSMENT/PLAN:   Assessment & Plan Encounter for medication management Patient previously stable on mirabegron, but now unaffordable.  Solifenacin would be an acceptable alternative, but would much rather avoid oxybutynin. -Will monitor without mirabegron for now, patient can return for follow up if experiences worsening incontinence -Instructed patient to follow up on purchasing Medicare Part D for better coverage, he will reach out ot Medicare office -Messaged Siri Cole and Dr. Raymondo Band to follow up on possible assistance programs or other alternatives Smoking history Approximately 20 pack-year  history, quit 2 years ago.  Meets criteria for low-dose CT and AAA [place] screening.  Ordered previous visit, but patient no-showed CT appointment and there were apparent issues with AAA [place] order. -Lung cancer screening: Reordered today, CMA scheduled during visit for 2/26 and patient notified -AAA screening: Reordered today, CMA scheduled during visit for 2/26 and patient notified Medication refill -Refilled amlodipine 10 mg daily -Refilled tamsulosin 0.4 mg daily  Follow up already scheduled 08/27/2023.  Sheika Coutts Sharion Dove, MD [facility]

## 2023-06-29 ENCOUNTER — Other Ambulatory Visit (HOSPITAL_COMMUNITY): Payer: Self-pay

## 2023-07-01 ENCOUNTER — Ambulatory Visit (HOSPITAL_COMMUNITY)
Admission: RE | Admit: 2023-07-01 | Discharge: 2023-07-01 | Disposition: A | Discharge status: Home / Self Care | Payer: Medicare (Managed Care) | Source: Ambulatory Visit | Attending: Family Medicine | Admitting: Family Medicine

## 2023-07-01 ENCOUNTER — Telehealth: Payer: Self-pay | Admitting: Family Medicine

## 2023-07-01 ENCOUNTER — Encounter: Payer: Self-pay | Admitting: Family Medicine

## 2023-07-01 DIAGNOSIS — Z122 Encounter for screening for malignant neoplasm of respiratory organs: Secondary | ICD-10-CM | POA: Insufficient documentation

## 2023-07-01 DIAGNOSIS — Z87891 Personal history of nicotine dependence: Secondary | ICD-10-CM | POA: Insufficient documentation

## 2023-07-01 DIAGNOSIS — Z136 Encounter for screening for cardiovascular disorders: Secondary | ICD-10-CM | POA: Diagnosis present

## 2023-07-01 NOTE — Telephone Encounter (Signed)
-----   Message from Otho Najjar sent at 06/29/2023  4:01 PM EST ----- Regarding: RE: Mirabegron Affordability All is good. The pharmacy should be filling it for the generic, since his insurance prefers it :) ----- Message ----- From: Nelia Shi, MD Sent: 06/29/2023   3:32 PM EST To: Otho Najjar, CPhT Subject: RE: Mirabegron Affordability                   Thank you very much, Durward Mallard!  Do I need to send a different prescription at all for him to get the generic version, or is the current one okay?  I can call him and tell him then. ----- Message ----- From: Otho Najjar, CPhT Sent: 06/29/2023  12:24 PM EST To: Kathrin Ruddy, RPH-CPP; Lasheka Kempner Sharion Dove, MD Subject: RE: Mirabegron Affordability                   Hey all!  Patient has [employer] coverage and copay shows generic Myrbetriq is $0 copay for 30 day supply. Patient has met his deductible. ----- Message ----- From: Nelia Shi, MD Sent: 06/25/2023   8:07 AM EST To: Kathrin Ruddy, RPH-CPP; # Subject: Mirabegron Affordability                       Hello Camille and Dr. Raymondo Band,  This patient is a new one for my panel.  He was previously getting mirabegron from some sort of pill pack pharmacy, unsure which one and patient is unclear as well.  It was either free or affordable.  Now, when I attempted to refill it, he was notified it would be ~$500.  He has Medicare A&B.  I have asked him to look into adding Part D, which he is currently doing.  However, I wanted to ensure there were no assistive services or other options I was missing to help make the medication affordable.  Solifenacin would be an acceptable alternative, but would much rather avoid oxybutynin.  Thank you, Crayton Savarese S.

## 2023-07-01 NOTE — Telephone Encounter (Signed)
 Called patient and notified him that mirabegron generic should be $0 at this point per our pharmacy. He will check with pharmacy and call back if he experiences issues.

## 2023-07-02 ENCOUNTER — Telehealth: Payer: Self-pay | Admitting: Family Medicine

## 2023-07-02 NOTE — Telephone Encounter (Signed)
 Patient walked in stating he went to the pharmacy to get his medication. It was not there and he cannot afford it:  Last date of OV:  06/23/23 Pharmacy:  Same Requesting a call from the doctor  Will route refill request to Clinic RN.  Discussed with patient policy to call pharmacy for future refills.  Also, discussed refills may take up to 48 hours to approve or deny.  Vilinda Blanks

## 2023-07-03 ENCOUNTER — Other Ambulatory Visit (HOSPITAL_COMMUNITY): Payer: Self-pay

## 2023-07-06 NOTE — Telephone Encounter (Signed)
 Attempted to call about his prescription being available at his pharmacy.  Left a Generic VM to call office back.  If patient calls office back, please inform him that his prescription is available at his pharmacy medication was unavailable due to insurance card not being on file.  Drusilla Kanner, CMA

## 2023-07-10 ENCOUNTER — Ambulatory Visit (HOSPITAL_COMMUNITY): Admission: RE | Admit: 2023-07-10 | Payer: Medicare Other | Source: Ambulatory Visit

## 2023-07-10 ENCOUNTER — Other Ambulatory Visit: Payer: Self-pay | Admitting: Family Medicine

## 2023-07-10 DIAGNOSIS — Z8673 Personal history of transient ischemic attack (TIA), and cerebral infarction without residual deficits: Secondary | ICD-10-CM

## 2023-07-10 DIAGNOSIS — Z136 Encounter for screening for cardiovascular disorders: Secondary | ICD-10-CM

## 2023-07-10 DIAGNOSIS — Z23 Encounter for immunization: Secondary | ICD-10-CM

## 2023-07-10 DIAGNOSIS — Z122 Encounter for screening for malignant neoplasm of respiratory organs: Secondary | ICD-10-CM

## 2023-07-10 DIAGNOSIS — Z0000 Encounter for general adult medical examination without abnormal findings: Secondary | ICD-10-CM

## 2023-07-10 DIAGNOSIS — I10 Essential (primary) hypertension: Secondary | ICD-10-CM

## 2023-07-27 ENCOUNTER — Telehealth: Payer: Self-pay

## 2023-07-27 NOTE — Telephone Encounter (Signed)
 Radiology calls nurse line to give report on CT Chest.   Verified imaging is back for viewing.   Will forward to PCP.

## 2023-08-03 ENCOUNTER — Telehealth: Payer: Self-pay | Admitting: Family Medicine

## 2023-08-03 DIAGNOSIS — R918 Other nonspecific abnormal finding of lung field: Secondary | ICD-10-CM

## 2023-08-03 NOTE — Telephone Encounter (Signed)
-----   Message from Treyon Wymore sent at 07/29/2023  3:36 PM EDT ----- Regarding: RE: Possible lung cancer Called line 3x over 3 days, no response.  Left message with plan to schedule call between 1:30-3:00 PM on 3/27. ----- Message ----- From: McDiarmid, Leighton Roach, MD Sent: 07/27/2023   8:51 AM EDT To: Leighton Roach McDiarmid, MD; Nelia Shi, MD Subject: Possible lung cancer                           Valborg Friar-  Given patient's LD CT findings of possible lung malignancy, I would recommend contacting the [facility] through [facility] [phone]).   Ask to speak with the [facility] Nurse Navigator.   The Nurse Navigator can help you get the patient started in the multidisciplinary evaluation for possible lung cancer (primary or metastatic).  I would discuss case with the Nurse Navigator before talking with the patient about his CT findings.   Let me know if you have questions.    Huey Bienenstock ----- Message ----- From: Interface, Rad Results In Sent: 07/24/2023   5:00 PM EDT To: Leighton Roach McDiarmid, MD

## 2023-08-07 ENCOUNTER — Encounter: Payer: Self-pay | Admitting: Family Medicine

## 2023-08-07 NOTE — Telephone Encounter (Signed)
 Unable to reach patient and brother Franciso [place] at listed number.  Left two voicemails for patient notifying him that I would be calling during designated hour the following day without success.  Fred Boweden's number appears to not be in service.  Called listed relative Caron Presume as well, unable to reach x2.  Will send certified letter to patient detailing his CT lung findings, notifying him of referral to Pulmonology, and requesting call back and follow up in clinic.

## 2023-08-07 NOTE — Progress Notes (Signed)
 Placed urgent referral to Pulm after discussion with [facility] Nurse Navigator.   Unable to reach patient and listed relatives after several calls across >1 week.  See phone note.   Sending certified letter to patient informing him that he needs to call clinic and schedule follow up appointment concerning CT results.  Letter printed and placed in RN box to be sent Monday as certified letter per Dr. Valorie Roosevelt advice.  Routing this message to [facility] RN Team.

## 2023-08-27 ENCOUNTER — Encounter: Payer: Self-pay | Admitting: Family Medicine

## 2023-08-27 ENCOUNTER — Ambulatory Visit (INDEPENDENT_AMBULATORY_CARE_PROVIDER_SITE_OTHER): Payer: Medicare (Managed Care) | Admitting: Family Medicine

## 2023-08-27 VITALS — BP 118/81 | HR 94 | Ht 67.0 in | Wt 193.1 lb

## 2023-08-27 DIAGNOSIS — Z1211 Encounter for screening for malignant neoplasm of colon: Secondary | ICD-10-CM | POA: Diagnosis not present

## 2023-08-27 DIAGNOSIS — F1010 Alcohol abuse, uncomplicated: Secondary | ICD-10-CM

## 2023-08-27 DIAGNOSIS — R911 Solitary pulmonary nodule: Secondary | ICD-10-CM

## 2023-08-27 MED ORDER — FOLIC ACID 1 MG PO TABS: 1.0000 mg | ORAL_TABLET | Freq: Every day | ORAL | 1 refills | Status: DC

## 2023-08-27 NOTE — Progress Notes (Signed)
   SUBJECTIVE:   CHIEF COMPLAINT / HPI:  Andre [name] is a 73 y.o. male with a pertinent past medical history of HTN, CVA, HLD, BPH, and ~20 pack-year smoking history presenting to the clinic for follow up of lung CT results.  Screening low dose CT lung (read completed on 3/21 showed solid R pulmonary nodules in RLL and RML new from 2014 CTA suspicious for multifocal adenocarcinoma.  Called [facility] nurse navigator on 3/23 and placed urgent referral to pulm.  However, referral ultimately went to [facility].  Regardless, unable to reach patient, brother, and other listed relative via phone for extended duration and ultimately sent certified letter to patient.  He now presents for follow up visit.  Denies chest pain, productive cough, hemoptysis. Quit smoking 2 years ago, smoked ~10 years, at least 2 ppd.   PERTINENT PMH / PSH: HTN, CVA, HLD, and BPH   OBJECTIVE:   BP 118/81   Pulse 94   Ht 5\' 7"  (1.702 m)   Wt 193 lb 2 oz (87.6 kg)   SpO2 95%   BMI 30.25 kg/m   General: Age-appropriate, resting comfortably in chair, NAD, alert and at baseline. Cardiovascular: Regular rate and rhythm. Normal S1/S2. No murmurs, rubs, or gallops appreciated. 2+ radial pulses. Pulmonary: Clear bilaterally to ascultation. No wheezes, crackles, or rhonchi. Normal WOB on room air. No accessory muscle use. Extremities: No peripheral edema bilaterally. Capillary refill <2 seconds. Psych: Full range affect, normal mood.  Pleasant, appropriate.  Reacted with concern to cancer news, but remained resilient.   ASSESSMENT/PLAN:   [facility] [place] [facility] Care Assessment & Plan Pulmonary nodule 1 cm or greater in diameter Suspect malignancy.  Spent entire visit coordinating care and on phone to ensure appointment with [facility] Pul[facility] scheduled prior to leaving office, since patient has been unreachable by phone prior to this. - Scheduled on 4/30 with Dara Ear, NP at 9:30 AM, instructions and  direction clearly provided Screening for colon cancer - Referral to GI for screening colonoscopy  Return in about 2 months (around 10/27/2023) for care coordination.  Olympia Adelsberger Lansing Planas, MD [facility]

## 2023-08-27 NOTE — Patient Instructions (Signed)
 It was great to see you today! Thank you for choosing [facility] for your primary care.  Today we addressed: 1. Pulmonary nodule 1 cm or greater in diameter You have 2 concerning nodules in your right lung. Please go see [facility] at [address] [place], [place] [address] on Wednesday April 30th at 9:30 AM.  2. Screening for colon cancer In a few months, you will get a call about scheduling your colonoscopy.  Please look out for that call.  You should return to our clinic in 2 months to follow up.  Thank you for coming to see us  at [facility] and for the opportunity to care for you! Tieshia Rettinger, MD 08/27/2023, 3:06 PM

## 2023-09-02 ENCOUNTER — Ambulatory Visit (INDEPENDENT_AMBULATORY_CARE_PROVIDER_SITE_OTHER): Payer: Medicare (Managed Care) | Admitting: Acute Care

## 2023-09-02 ENCOUNTER — Encounter: Payer: Self-pay | Admitting: Acute Care

## 2023-09-02 VITALS — BP 130/81 | HR 82 | Ht 67.0 in | Wt 193.2 lb

## 2023-09-02 DIAGNOSIS — Z87891 Personal history of nicotine dependence: Secondary | ICD-10-CM

## 2023-09-02 DIAGNOSIS — R918 Other nonspecific abnormal finding of lung field: Secondary | ICD-10-CM | POA: Diagnosis not present

## 2023-09-02 DIAGNOSIS — Z8679 Personal history of other diseases of the circulatory system: Secondary | ICD-10-CM

## 2023-09-02 DIAGNOSIS — Z8673 Personal history of transient ischemic attack (TIA), and cerebral infarction without residual deficits: Secondary | ICD-10-CM | POA: Diagnosis not present

## 2023-09-02 NOTE — Patient Instructions (Signed)
 It is good to see you today. I have ordered a PET scan to better evaluate the nodule seen on your lung cancer screening scan. You will get a call to schedule this. You will follow up with me 1 week after the scan to go over the results and we will determine if you need a biopsy of the lung nodule.  I will talk with you more about that after we see the results of the PET scan. Call us  if you have any questions. Please contact office for sooner follow up if symptoms do not improve or worsen or seek emergency care

## 2023-09-02 NOTE — Progress Notes (Signed)
 History of Present Illness Andre [name] is a 73 y.o. male former smoker referred by Andre [name] 08/2023 for evaluation of an abnormal lung cancer screening Ct Chest, and possible bronchoscopy with biopsies. He will be followed by Andre [name] and Andre [name]   PMH - Hypertension - Stroke in 2020 - Hyperlipidemia - History of heavy smoking (20 pack year history, quit 2023 )   09/02/2023 Pt. Presents for evaluation of a pulmonary nodule first noted on a low-dose screening CT ordered by the patient's primary care physician.  The low-dose CT which was done 07/01/2023 and read 07/24/2023 showed a part solid right pulmonary nodule measuring 20.6 mm with a 12 mm solid component in the right lower lobe and a 15.2 mm with a 7 mm solid component in the posterior right middle lobe both of which are new from 2014 CT angio study.  These were noted as being suspicious for a multifocal lung adenocarcinoma..  The patient is a former smoker, quit in 2023 with a 50-pack-year smoking history.  Patient lives with his brother in a townhome since he had his stroke in 2020.  He is currently working with his brother to try to get himself into an assisted living.  He does endorse some unexplained weight loss but denies any hemoptysis.  We reviewed the CT scan together.  We discussed that the next best step would be to do a PET scan to evaluate these nodules for increased SUV activity.  I did explain to the patient that I wanted to get this done quickly so that we do not delay any potential treatment needed.  I also spoke to the patient's brother who will be providing the transportation for imaging.  He agreed that the best option was to get expedited imaging with follow-up in our office 1 week after to review the results and determine next best steps in his plan of care.  I suspect that this will include a bronchoscopy with biopsies.  Patient is currently on Plavix .  We will have to get agreement from neurology for  him to stop that for 5 days prior to the procedure.  Patient denies any current respiratory issues.  He states he does have an occasional cough that is productive, secretions are clear.  He denies any fever or discolored secretions.  Patient is single, lives with his brother in a townhouse, does not have children.  He was unable to describe any possible environmental exposures at his previous workplace.  Test Results: PET scan ordered 09/02/2023, scheduled for 09/09/2023, follow-up to review the PET scan results 09/16/2023  CT chest 07/01/2023 read 07/24/2023, Lung-RADS 4B, suspicious. Additional imaging evaluation or consultation with Pulmonology or Thoracic Surgery recommended. Similar part solid right pulmonary nodules measuring 20.6 mm with 12.0 mm solid component in the posteromedial right lower lobe and 15.2 mm with 7.0 mm solid component in the posterior right middle lobe, both new from 2014 chest CT angiogram study, suspicious for multifocal lung adenocarcinoma. 2. Aortic Atherosclerosis (ICD10-I70.0) and Emphysema (ICD10-J43.9).       Latest Ref Rng & Units 04/17/2021    1:17 AM 04/16/2021    4:25 AM 04/15/2021    2:55 AM  CBC  WBC 4.0 - 10.5 K/uL 7.1  8.6  11.0   Hemoglobin 13.0 - 17.0 g/dL 9.6  9.9  9.2   Hematocrit 39.0 - 52.0 % 30.7  30.5  29.5   Platelets 150 - 400 K/uL 349  307  273  Latest Ref Rng & Units 04/17/2021    1:17 AM 04/16/2021    4:25 AM 04/15/2021    2:55 AM  BMP  Glucose 70 - 99 mg/dL 86  84  78   BUN 8 - 23 mg/dL 29  22  20    Creatinine 0.61 - 1.24 mg/dL 1.61  0.96  0.45   Sodium 135 - 145 mmol/L 136  138  138   Potassium 3.5 - 5.1 mmol/L 4.2  3.8  3.9   Chloride 98 - 111 mmol/L 106  105  106   CO2 22 - 32 mmol/L 25  25  24    Calcium  8.9 - 10.3 mg/dL 8.9  8.8  8.5     BNP    Component Value Date/Time   BNP 45.7 12/30/2019 0510    ProBNP No results found for: "PROBNP"  PFT No results found for: "FEV1PRE", "FEV1POST", "FVCPRE",  "FVCPOST", "TLC", "DLCOUNC", "PREFEV1FVCRT", "PSTFEV1FVCRT"  No results found.   Past medical hx Past Medical History:  Diagnosis Date   Benign essential HTN 05/09/2015   Benign prostate hyperplasia    COVID-19 virus infection 12/30/2019   Furuncle of face 08/07/2017   History of blood transfusion    "qwhen I got my jaw broke" (05/05/2017)   Hypertension    Localized swelling, mass or lump of neck 06/17/2017   Polysubstance abuse (HCC)    including alcohol, tobacco, and cocaine /notes 05/04/2017   Stroke (HCC) 11/12012   Stroke (HCC) 05/04/2017   Recurrent pontine stroke with resulting right hemiplegia and right cranial nerve palsies/notes 05/04/2017   Tobacco use disorder    UTI (urinary tract infection) 04/13/2021   Weakness      Social History   Tobacco Use   Smoking status: Former    Current packs/day: 1.00    Average packs/day: 1 pack/day for 50.0 years (50.0 ttl pk-yrs)    Types: Cigarettes   Smokeless tobacco: Never   Tobacco comments:    Smokes 5 per day  Vaping Use   Vaping status: Former  Substance Use Topics   Alcohol use: Not Currently    Alcohol/week: 42.0 standard drinks of alcohol    Types: 42 Cans of beer per week    Comment: 05/05/2017 "40oz beer/day; maybe more"   Drug use: Not Currently    Types: Cocaine, Marijuana    Mr.Pitz reports that he has quit smoking. His smoking use included cigarettes. He has a 50 pack-year smoking history. He has never used smokeless tobacco. He reports that he does not currently use alcohol after a past usage of about 42.0 standard drinks of alcohol per week. He reports that he does not currently use drugs after having used the following drugs: Cocaine and Marijuana.  Tobacco Cessation: Counseling given: Not Answered Tobacco comments: Smokes 5 per day Former smoker  Past surgical hx, Family hx, Social hx all reviewed.  Current Outpatient Medications on File Prior to Visit  Medication Sig   amLODipine  (NORVASC )  10 MG tablet Take 1 tablet (10 mg total) by mouth daily.   aspirin  81 MG chewable tablet Chew 1 tablet (81 mg total) by mouth daily.   atorvastatin  (LIPITOR ) 80 MG tablet Take 1 tablet (80 mg total) by mouth daily.   clopidogrel  (PLAVIX ) 75 MG tablet Take 1 tablet (75 mg total) by mouth daily.   folic acid  (FOLVITE ) 1 MG tablet Take 1 tablet (1 mg total) by mouth daily.   multivitamin (PROSIGHT) TABS tablet Take 1 tablet by mouth daily.  senna (SENOKOT) 8.6 MG tablet Take 1 tablet by mouth 2 (two) times daily.   sildenafil (VIAGRA) 50 MG tablet Take 50 mg by mouth daily as needed for erectile dysfunction.   tamsulosin  (FLOMAX ) 0.4 MG CAPS capsule Take 1 capsule (0.4 mg total) by mouth daily.   thiamine  100 MG tablet Take 1 tablet (100 mg total) by mouth daily.   mirabegron  ER (MYRBETRIQ ) 25 MG TB24 tablet Take 1 tablet (25 mg total) by mouth daily at 12 noon. (Patient not taking: Reported on 09/02/2023)   No current facility-administered medications on file prior to visit.     No Known Allergies  Review Of Systems:  Constitutional:   No  weight loss, night sweats,  Fevers, chills, fatigue, or  lassitude.  HEENT:   No headaches,  Difficulty swallowing,  Tooth/dental problems, or  Sore throat,                No sneezing, itching, ear ache, nasal congestion, post nasal drip,   CV:  No chest pain,  Orthopnea, PND, swelling in lower extremities, anasarca, dizziness, palpitations, syncope.   GI  No heartburn, indigestion, abdominal pain, nausea, vomiting, diarrhea, change in bowel habits, loss of appetite, bloody stools.   Resp: No shortness of breath with exertion or at rest.  No excess mucus, no productive cough,  No non-productive cough,  No coughing up of blood.  No change in color of mucus.  No wheezing.  No chest wall deformity  Skin: no rash or lesions.  GU: no dysuria, change in color of urine, no urgency or frequency.  No flank pain, no hematuria   MS:  No joint pain or swelling.   No decreased range of motion.  No back pain.  Psych:  No change in mood or affect. No depression or anxiety.  No memory loss.   Vital Signs BP 130/81 (BP Location: Left Arm, Patient Position: Sitting, Cuff Size: Normal)   Pulse 82   Ht 5\' 7"  (1.702 m)   Wt 193 lb 3.2 oz (87.6 kg)   SpO2 97%   BMI 30.26 kg/m    Physical Exam:  General- No distress,  A&Ox3, pleasant elderly gentleman who walks with a cane ENT: No sinus tenderness, TM clear, pale nasal mucosa, no oral exudate,no post nasal drip, no LAN, some slurred speech Cardiac: S1, S2, regular rate and rhythm, no murmur Chest: No wheeze/ rales/ dullness; no accessory muscle use, no nasal flaring, no sternal retractions Abd.: Soft Non-tender, nondistended, bowel sounds positive,Body mass index is 30.26 kg/m.  Ext: No clubbing cyanosis, edema, no obvious deformities Neuro: Physical deconditioning, walks with a cane, residual left-sided weakness, follows all commands, moves all extremities x 4 Skin: No rashes, warm and dry, no obvious skin lesions Psych: normal mood and behavior   Assessment/Plan Lung RADS 4B low-dose lung cancer screening scan Concerning for malignancy Former smoker Plan I have ordered a PET scan to better evaluate the nodule seen on your lung cancer screening scan. You will get a call to schedule this. You will follow up with me 1 week after the scan to go over the results and we will determine if you need a biopsy of the lung nodule.  I will talk with you more about that after we see the results of the PET scan. Call us  if you have any questions. Please contact office for sooner follow up if symptoms do not improve or worsen or seek emergency care    I spent 20 minutes  dedicated to the care of this patient on the date of this encounter to include pre-visit review of records, face-to-face time with the patient discussing conditions above, post visit ordering of testing, clinical documentation with the  electronic health record, making appropriate referrals as documented, and communicating necessary information to the patient's healthcare team.   Raejean Bullock, NP 09/02/2023  9:47 AM

## 2023-09-02 NOTE — Telephone Encounter (Signed)
 Certified letter sent on 08/10/23 was returned due to "unreachable" status.  Placed in senders box for FYI and next steps. Andre [name]

## 2023-09-08 ENCOUNTER — Other Ambulatory Visit: Payer: Self-pay | Admitting: Family Medicine

## 2023-09-09 ENCOUNTER — Ambulatory Visit (HOSPITAL_COMMUNITY)
Admission: RE | Admit: 2023-09-09 | Discharge: 2023-09-09 | Disposition: A | Discharge status: Home / Self Care | Payer: Medicare (Managed Care) | Source: Ambulatory Visit | Attending: Acute Care | Admitting: Acute Care

## 2023-09-09 DIAGNOSIS — R918 Other nonspecific abnormal finding of lung field: Secondary | ICD-10-CM | POA: Insufficient documentation

## 2023-09-09 LAB — GLUCOSE, CAPILLARY: Glucose-Capillary: 90 mg/dL (ref 70–99)

## 2023-09-09 MED ORDER — FLUDEOXYGLUCOSE F - 18 (FDG) INJECTION
9.6000 | Freq: Once | INTRAVENOUS | Status: AC
  Administered 2023-09-09: 9.6 via INTRAVENOUS

## 2023-09-16 ENCOUNTER — Telehealth: Payer: Self-pay | Admitting: Acute Care

## 2023-09-16 ENCOUNTER — Encounter: Payer: Self-pay | Admitting: Acute Care

## 2023-09-16 ENCOUNTER — Ambulatory Visit (INDEPENDENT_AMBULATORY_CARE_PROVIDER_SITE_OTHER): Payer: Medicare (Managed Care) | Admitting: Acute Care

## 2023-09-16 VITALS — BP 141/80 | HR 84 | Ht 67.0 in | Wt 192.0 lb

## 2023-09-16 DIAGNOSIS — Z87891 Personal history of nicotine dependence: Secondary | ICD-10-CM

## 2023-09-16 DIAGNOSIS — R948 Abnormal results of function studies of other organs and systems: Secondary | ICD-10-CM

## 2023-09-16 DIAGNOSIS — R918 Other nonspecific abnormal finding of lung field: Secondary | ICD-10-CM

## 2023-09-16 NOTE — Progress Notes (Signed)
 History of Present Illness Andre [name] is a 73 y.o. male former smoker ( Quit 2021 with a 50 pack year smoking history) referred for abnormal screening CT Chest done by patient's PCP.    Pt. Has consented to use of [employer] soft wear to help capture the content of this OV    09/16/2023 Andre [name] is a 74 year old male who presents with a right pulmonary nodule and abnormal PET scan findings. He is accompanied by his brother. He was referred by Doctor McDiarmid for  evaluation of a right pulmonary nodule.  He underwent a lung cancer screening scan which revealed a right pulmonary nodule. A subsequent PET scan showed minimal uptake in the lung nodules, but there was an area of uptake in the upper mediastinum near the thyroid , prompting further evaluation.  I explained that I needed Dr. Baldwin Levee to evaluate the scan to determine next best steps and plan of care. We would either do the CT Neck suggested to further determine if the area of uptake noted was the upper mediastinal lymph node vs a lesion on the  thyroid . I have spoken with Dr. Baldwin Levee. He feels the mediastinal lymph node is not reachable by EBUS, so plan will be for the CT Neck, and based on the findings, possible biopsy. I will call the patient and let him know.   Pt. States he is doing well from a breathing perspective. He is in his usual state of health  Test Results: PET 09/09/2023 Ground-glass nodules again seen. There is only minimal uptake along these foci. Again there is a differential. Could consider follow-up CT imaging in 3 months to assess for occult growth. If there is more clinical concern a more aggressive approach could be considered.   Significant abnormal uptake within a small soft tissue nodule in the upper mediastinum just caudal to the right thyroid  lobe near the midline. This could be an abnormal mediastinal lymph node versus a exophytic thyroid  lesion. Recommend further workup of this  lesion. Dedicated neck CT scan with contrast may be useful versus MRI to further characterize.  LDCT Chest 07/01/2023 Mediastinum/Nodes: No significant thyroid  nodules. Unremarkable esophagus. No pathologically enlarged axillary, mediastinal or hilar lymph nodes, noting limited sensitivity for the detection of hilar adenopathy on this noncontrast study.   Lungs/Pleura: No pneumothorax. No pleural effusion. Mild centrilobular emphysema with diffuse bronchial wall thickening. No acute consolidative airspace disease or lung masses. Similar part solid right pulmonary nodules measuring 20.6 mm with 12.0 mm solid component in posteromedial right lower lobe on series 4/image 210 and 15.2 mm with 7.0 mm solid component in the posterior right middle lobe on series 4/image 231, both new from 05/16/2012 chest CT angiogram study. Solid superior segment left lower lobe 6.3 mm pulmonary nodule on series 4/image 168 is stable from 05/16/2012 CT and considered benign.  Lung-RADS 4B, suspicious. Additional imaging evaluation or consultation with Pulmonology or Thoracic Surgery recommended. Similar part solid right pulmonary nodules measuring 20.6 mm with 12.0 mm solid component in the posteromedial right lower lobe and 15.2 mm with 7.0 mm solid component in the posterior right middle lobe, both new from 2014 chest CT angiogram study, suspicious for multifocal lung adenocarcinoma. 2. Aortic Atherosclerosis (ICD10-I70.0) and Emphysema (ICD10-J43.9).        Latest Ref Rng & Units 04/17/2021    1:17 AM 04/16/2021    4:25 AM 04/15/2021    2:55 AM  CBC  WBC 4.0 - 10.5 K/uL 7.1  8.6  11.0   Hemoglobin 13.0 - 17.0 g/dL 9.6  9.9  9.2   Hematocrit 39.0 - 52.0 % 30.7  30.5  29.5   Platelets 150 - 400 K/uL 349  307  273        Latest Ref Rng & Units 04/17/2021    1:17 AM 04/16/2021    4:25 AM 04/15/2021    2:55 AM  BMP  Glucose 70 - 99 mg/dL 86  84  78   BUN 8 - 23 mg/dL 29  22  20    Creatinine  0.61 - 1.24 mg/dL 7.82  9.56  2.13   Sodium 135 - 145 mmol/L 136  138  138   Potassium 3.5 - 5.1 mmol/L 4.2  3.8  3.9   Chloride 98 - 111 mmol/L 106  105  106   CO2 22 - 32 mmol/L 25  25  24    Calcium  8.9 - 10.3 mg/dL 8.9  8.8  8.5     BNP    Component Value Date/Time   BNP 45.7 12/30/2019 0510    ProBNP No results found for: "PROBNP"  PFT No results found for: "FEV1PRE", "FEV1POST", "FVCPRE", "FVCPOST", "TLC", "DLCOUNC", "PREFEV1FVCRT", "PSTFEV1FVCRT"  NM PET Image Initial (PI) Skull Base To Thigh Result Date: 09/14/2023 CLINICAL DATA:  Initial treatment strategy for lung nodule. EXAM: NUCLEAR MEDICINE PET SKULL BASE TO THIGH TECHNIQUE: 9.57 mCi F-18 FDG was injected intravenously. Full-ring PET imaging was performed from the skull base to thigh after the radiotracer. CT data was obtained and used for attenuation correction and anatomic localization. Fasting blood glucose: 90 mg/dl COMPARISON:  CT 08/65/7846. FINDINGS: Mediastinal blood pool activity: SUV max 2.5 Liver activity: SUV max 3.2 NECK: No abnormal uptake identified in the neck including along lymph node change of the submandibular, posterior triangle or internal jugular region. Near symmetric uptake of the visualized intracranial compartment. There is some motion. Incidental CT findings: Visualized paranasal sinuses and mastoid air cells are clear. Slight deformity of the medial wall the right orbit. Please correlate for any remote trauma or other chronic process. Vascular calcifications identified. The parotid glands, submandibular glands and thyroid  gland are unremarkable. CHEST: Immediately inferior to the right thyroid  lobe medially is a focal soft tissue nodule which has some uptake of maximum SUV value of 6.5. On image 51 measures 10 x 8 mm this is not seen on remote CTA of the head neck from 03/16/2021. Oral this is difficult to see on the prior chest CT scan due to technique. Likely present. Recommend further evaluation.  There is some very slight uptake along small nodes in the hila bilaterally. Maximum SUV of 4.6 on the right and 41.1 on the left. Nonspecific. No abnormal uptake above blood pool in the axillary regions or elsewhere along the mediastinum. No specific areas of abnormal uptake identified in the lungs. This includes along the ground-glass nodule posteriorly in the middle lobe seen today on image 82. This has only minimal area of uptake of maximum SUV value of 1.0. Focus in the medial aspect of the right lower lobe on the prior examination, today is also seen on image 75 and again has only minimal uptake of maximum SUV 2.6. Incidental CT findings: Breathing motion. No pleural effusion. Underlying emphysematous lung changes. Scattered vascular calcifications are seen. Small hiatal hernia suggested. ABDOMEN/PELVIS: Physiologic distribution radiotracer along the parenchymal organs, bowel and renal collecting systems. Incidental CT findings: Mild bilateral renal atrophy. No collecting system dilatation. Vascular calcifications are seen. Previous cholecystectomy. Bowel is  nondilated. Scattered colonic stool. Few left-sided colonic diverticula. SKELETON: No specific areas of abnormal uptake along the visualized osseous structures. Incidental CT findings: Diffuse degenerative changes along the spine. Degenerative changes of the shoulders and pelvis. IMPRESSION: Ground-glass nodules again seen. There is only minimal uptake along these foci. Again there is a differential. Could consider follow-up CT imaging in 3 months to assess for occult growth. If there is more clinical concern a more aggressive approach could be considered. Significant abnormal uptake within a small soft tissue nodule in the upper mediastinum just caudal to the right thyroid  lobe near the midline. This could be an abnormal mediastinal lymph node versus a exophytic thyroid  lesion. Recommend further workup of this lesion. Dedicated neck CT scan with contrast  may be useful versus MRI to further characterize. Electronically Signed   By: Adrianna Horde M.D.   On: 09/14/2023 14:21     Past medical hx Past Medical History:  Diagnosis Date   Benign essential HTN 05/09/2015   Benign prostate hyperplasia    COVID-19 virus infection 12/30/2019   Furuncle of face 08/07/2017   History of blood transfusion    "qwhen I got my jaw broke" (05/05/2017)   Hypertension    Localized swelling, mass or lump of neck 06/17/2017   Polysubstance abuse (HCC)    including alcohol, tobacco, and cocaine /notes 05/04/2017   Stroke (HCC) 11/12012   Stroke (HCC) 05/04/2017   Recurrent pontine stroke with resulting right hemiplegia and right cranial nerve palsies/notes 05/04/2017   Tobacco use disorder    UTI (urinary tract infection) 04/13/2021   Weakness      Social History   Tobacco Use   Smoking status: Former    Current packs/day: 0.00    Average packs/day: 1 pack/day for 50.0 years (50.0 ttl pk-yrs)    Types: Cigarettes    Quit date: 2021    Years since quitting: 4.3    Passive exposure: Current   Smokeless tobacco: Never  Vaping Use   Vaping status: Former  Substance Use Topics   Alcohol use: Not Currently    Alcohol/week: 42.0 standard drinks of alcohol    Types: 42 Cans of beer per week    Comment: 05/05/2017 "40oz beer/day; maybe more"   Drug use: Not Currently    Types: Cocaine, Marijuana    Mr.Merritts reports that he quit smoking about 4 years ago. His smoking use included cigarettes. He has a 50 pack-year smoking history. He has been exposed to tobacco smoke. He has never used smokeless tobacco. He reports that he does not currently use alcohol after a past usage of about 42.0 standard drinks of alcohol per week. He reports that he does not currently use drugs after having used the following drugs: Cocaine and Marijuana.  Tobacco Cessation: Counseling given: Not Answered Former smoker with a 42-pack-year smoking history quit 2021 after  stroke  Past surgical hx, Family hx, Social hx all reviewed.  Current Outpatient Medications on File Prior to Visit  Medication Sig   amLODipine  (NORVASC ) 10 MG tablet TAKE 1 TABLET(10 MG) BY MOUTH DAILY   aspirin  81 MG chewable tablet Chew 1 tablet (81 mg total) by mouth daily.   atorvastatin  (LIPITOR ) 80 MG tablet Take 1 tablet (80 mg total) by mouth daily.   clopidogrel  (PLAVIX ) 75 MG tablet Take 1 tablet (75 mg total) by mouth daily.   folic acid  (FOLVITE ) 1 MG tablet Take 1 tablet (1 mg total) by mouth daily.   mirabegron  ER (MYRBETRIQ ) 25  MG TB24 tablet Take 1 tablet (25 mg total) by mouth daily at 12 noon.   multivitamin (PROSIGHT) TABS tablet Take 1 tablet by mouth daily.   senna (SENOKOT) 8.6 MG tablet Take 1 tablet by mouth 2 (two) times daily.   sildenafil (VIAGRA) 50 MG tablet Take 50 mg by mouth daily as needed for erectile dysfunction.   tamsulosin  (FLOMAX ) 0.4 MG CAPS capsule Take 1 capsule (0.4 mg total) by mouth daily.   thiamine  100 MG tablet Take 1 tablet (100 mg total) by mouth daily.   No current facility-administered medications on file prior to visit.     No Known Allergies  Review Of Systems:  Constitutional:   No  weight loss, night sweats,  Fevers, chills, fatigue, or  lassitude.  HEENT:   No headaches,  Difficulty swallowing,  Tooth/dental problems, or  Sore throat,                No sneezing, itching, ear ache, nasal congestion, post nasal drip,   CV:  No chest pain,  Orthopnea, PND, swelling in lower extremities, anasarca, dizziness, palpitations, syncope.   GI  No heartburn, indigestion, abdominal pain, nausea, vomiting, diarrhea, change in bowel habits, loss of appetite, bloody stools.   Resp: No shortness of breath with exertion or at rest.  No excess mucus, no productive cough,  No non-productive cough,  No coughing up of blood.  No change in color of mucus.  No wheezing.  No chest wall deformity  Skin: no rash or lesions.  GU: no dysuria, change  in color of urine, no urgency or frequency.  No flank pain, no hematuria   MS:  No joint pain or swelling.  + decreased range of motion.  No back pain.  Psych:  No change in mood or affect. No depression or anxiety.  No memory loss.   Vital Signs BP (!) 141/80 (BP Location: Left Arm, Patient Position: Sitting, Cuff Size: Normal)   Pulse 84   Ht 5\' 7"  (1.702 m)   Wt 192 lb (87.1 kg)   SpO2 95%   BMI 30.07 kg/m    Physical Exam:  General- No distress,  A&Ox3, pleasant older male ENT: No sinus tenderness, TM clear, pale nasal mucosa, no oral exudate,no post nasal drip, no LAN Cardiac: S1, S2, regular rate and rhythm, no murmur Chest: No wheeze/ rales/ dullness; no accessory muscle use, no nasal flaring, no sternal retractions Abd.: Soft Non-tender, nondistended, bowel sounds positive,Body mass index is 30.07 kg/m.  Ext: No clubbing cyanosis, edema, walks with a cane secondary to deficit from his previous stroke Neuro: Physical deconditioning, walks with a cane secondary to deficit from previous stroke, alert and oriented x 3, moving all extremities x 4, speaks with a slight slur Skin: No rashes, warm and dry, no obvious skin lesions Psych: normal mood and behavior   Assessment/Plan Abnormal Lung Cancer Screening Scan  Former smoker  PET scan with uptake in the mediastinal lymph node or thyroid  lesion vs thyroid  gland Plan We will order a CT of the neck to better evaluate the location of the PET scan uptake. Follow up with me after the CT Neck You will get calls to get both the CT Neck and the follow up appointment with me scheduled.  Based on results, we will determine next best step in the plan of care.  Call if you need us  sooner.  Please contact office for sooner follow up if symptoms do not improve or worsen or seek  emergency care    I spent 25 minutes dedicated to the care of this patient on the date of this encounter to include pre-visit review of records, face-to-face  time with the patient discussing conditions above, post visit ordering of testing, clinical documentation with the electronic health record, making appropriate referrals as documented, and communicating necessary information to the patient's healthcare team.   Addendum 18:03 on 09/16/2023 I have called the patient and explained that we will do a CT Neck and he will follow up with me after the scan. He verbalized understanding.    Raejean Bullock, NP 09/16/2023  10:42 AM

## 2023-09-16 NOTE — Telephone Encounter (Signed)
 I have called the patient , and I spoke with him about Dr. Lacinda Pica recommendations regarding how to proceed based on the PET scan results.  I explained that Dr. Baldwin Levee recommends a dedicated neck CT scan with contrast to further characterize whether or not the PET scan finding is an abnormal mediastinal lymph node or an exophytic thyroid  lesion.  He understands that someone will call him to get the scan scheduled and that he will then follow-up with me or Dr. Baldwin Levee in the office to determine next best steps within a week of the scan being completed.  Patient verbalized understanding of the above

## 2023-09-16 NOTE — Patient Instructions (Addendum)
 It is good to see you today. We will order a CT of the neck to better evaluate the location of the PET scan uptake. Follow up with me after the CT Neck You will get calls to get both the CT Neck and the follow up appointment with me scheduled.  Based on results, we will determine next best step in the plan of care.  Call if you need us  sooner.  Please contact office for sooner follow up if symptoms do not improve or worsen or seek emergency care

## 2023-09-17 ENCOUNTER — Other Ambulatory Visit: Payer: Self-pay | Admitting: Family Medicine

## 2023-09-17 NOTE — Addendum Note (Signed)
 Addended by: Aryah Doering F on: 09/17/2023 08:49 AM   Modules accepted: Orders

## 2023-09-30 ENCOUNTER — Ambulatory Visit (HOSPITAL_COMMUNITY)

## 2023-10-08 ENCOUNTER — Other Ambulatory Visit: Payer: Self-pay | Admitting: Family Medicine

## 2023-10-09 ENCOUNTER — Ambulatory Visit (HOSPITAL_COMMUNITY): Payer: Medicare (Managed Care)

## 2023-10-23 ENCOUNTER — Ambulatory Visit (INDEPENDENT_AMBULATORY_CARE_PROVIDER_SITE_OTHER): Payer: Medicare (Managed Care) | Admitting: Student

## 2023-10-23 VITALS — BP 123/76 | HR 80 | Ht 67.0 in | Wt 191.6 lb

## 2023-10-23 DIAGNOSIS — R948 Abnormal results of function studies of other organs and systems: Secondary | ICD-10-CM | POA: Insufficient documentation

## 2023-10-23 DIAGNOSIS — F1010 Alcohol abuse, uncomplicated: Secondary | ICD-10-CM | POA: Diagnosis not present

## 2023-10-23 DIAGNOSIS — Z5982 Transportation insecurity: Secondary | ICD-10-CM | POA: Diagnosis not present

## 2023-10-23 MED ORDER — FOLIC ACID 1 MG PO TABS: 1.0000 mg | ORAL_TABLET | Freq: Every day | ORAL | 1 refills | Status: DC

## 2023-10-23 NOTE — Assessment & Plan Note (Signed)
 Referral placed to [facility] care management

## 2023-10-23 NOTE — Patient Instructions (Signed)
 It was great to see you! Thank you for allowing me to participate in your care!  I recommend that you always bring your medications to each appointment as this makes it easy to ensure you are on the correct medications and helps us  not miss when refills are needed.  Our plans for today:  - Call to schedule colonoscopy: [facility] Address: [address] [place] [address], [place], [place] [address] Phone: [phone] - I referred you to social workers who can help with navigating the health care system   Take care and seek immediate care sooner if you develop any concerns.   Dr. Glenn Lange, DO [facility]

## 2023-10-23 NOTE — Progress Notes (Signed)
    SUBJECTIVE:   CHIEF COMPLAINT / HPI:   Patient last seen by Rama Burkitt, NP with [facility] health [place] pulmonology on 09/16/2023 due to right pulmonary nodule found on CT cancer screening.  Had PET scan which showed minimal uptake but did show uptake near thyroid . Their plan is for CT neck and biopsy with IR if indicated.  CT neck has been ordered but not scheduled. Patient thinks there are insurance issues in the way of getting the CT scan but he is unsure. He also notes having difficulty with transportation and coordinating visits.  He would like some help with this.  PERTINENT  PMH / PSH: Former tobacco use, HLD, hx CVA, HTN  OBJECTIVE:   BP 123/76   Pulse 80   Ht 5' 7 (1.702 m)   Wt 191 lb 9.6 oz (86.9 kg)   SpO2 96%   BMI 30.01 kg/m    General: NAD, pleasant, able to participate in exam Cardiac: RRR, no murmurs. Respiratory: CTAB, normal effort, No wheezes, rales or rhonchi Neuro: alert, no obvious focal deficits Psych: Normal affect and mood  ASSESSMENT/PLAN:   Transportation insecurity Referral placed to [facility] care management  Abnormal PET scan of mediastinum Needs CT neck which has already been ordered by pulmonology to evaluate uptake in mediastinum concerning for a lymph node versus thyroid  lesion.  - I have messaged Eulogio Hidalgo NP to find out when this may get scheduled -[facility] referral placed to help patient navigate the system   Folate refilled per patient request Contact information given for colonoscopy consultation Patient advised to return for PCP visit after CT scan or sooner if needed  Dr. Glenn Lange, DO [facility]

## 2023-10-23 NOTE — Assessment & Plan Note (Signed)
 Needs CT neck which has already been ordered by pulmonology to evaluate uptake in mediastinum concerning for a lymph node versus thyroid  lesion.  - I have messaged Eulogio Hidalgo NP to find out when this may get scheduled -[facility] referral placed to help patient navigate the system

## 2023-10-26 ENCOUNTER — Telehealth: Payer: Self-pay | Admitting: *Deleted

## 2023-10-26 NOTE — Progress Notes (Unsigned)
 Complex Care Management Note Care Guide Note  10/26/2023 Name: Andre [name] MRN: [id number] DOB: 25-Jun-1950   Complex Care Management Outreach Attempts: An unsuccessful telephone outreach was attempted today to offer the patient information about available complex care management services.  Follow Up Plan:  Additional outreach attempts will be made to offer the patient complex care management information and services.   Encounter Outcome:  No Answer  Harlene Satterfield  [facility]  [facility], [facility] Guide  Direct Dial: [phone]  Fax [phone]

## 2023-10-28 NOTE — Progress Notes (Unsigned)
 Complex Care Management Note Care Guide Note  10/28/2023 Name: Andre [name] MRN: [id number] DOB: April 29, 1951   Complex Care Management Outreach Attempts: A second unsuccessful outreach was attempted today to offer the patient with information about available complex care management services.  Follow Up Plan:  Additional outreach attempts will be made to offer the patient complex care management information and services.   Encounter Outcome:  No Answer  Harlene Satterfield  [facility]  [facility], [facility] Guide  Direct Dial: [phone]  Fax [phone]

## 2023-10-29 NOTE — Progress Notes (Signed)
 Complex Care Management Note Care Guide Note  10/29/2023 Name: Andre [name] MRN: [id number] DOB: [birth year]   Complex Care Management Outreach Attempts: A third unsuccessful outreach was attempted today to offer the patient with information about available complex care management services.  Follow Up Plan:  No further outreach attempts will be made at this time. We have been unable to contact the patient to offer or enroll patient in complex care management services.  Encounter Outcome:  No Answer  Harlene Satterfield  [facility]  [facility], [facility] Guide  Direct Dial: [phone]  Fax [phone]

## 2023-10-31 ENCOUNTER — Encounter (HOSPITAL_COMMUNITY): Payer: Self-pay | Admitting: Interventional Radiology

## 2023-12-04 ENCOUNTER — Encounter (HOSPITAL_COMMUNITY): Payer: Self-pay

## 2024-01-01 ENCOUNTER — Ambulatory Visit (HOSPITAL_COMMUNITY)
Admission: RE | Admit: 2024-01-01 | Discharge: 2024-01-01 | Disposition: A | Discharge status: Home / Self Care | Payer: Medicare (Managed Care) | Source: Ambulatory Visit | Attending: Acute Care | Admitting: Acute Care

## 2024-01-01 DIAGNOSIS — R948 Abnormal results of function studies of other organs and systems: Secondary | ICD-10-CM | POA: Diagnosis present

## 2024-01-01 MED ORDER — IOHEXOL 300 MG/ML  SOLN
75.0000 mL | Freq: Once | INTRAMUSCULAR | Status: AC | PRN
  Administered 2024-01-01: 75 mL via INTRAVENOUS

## 2024-01-07 ENCOUNTER — Ambulatory Visit: Payer: Medicare (Managed Care) | Admitting: Acute Care

## 2024-01-20 ENCOUNTER — Encounter: Payer: Self-pay | Admitting: Acute Care

## 2024-01-20 ENCOUNTER — Ambulatory Visit: Admitting: Acute Care

## 2024-01-20 ENCOUNTER — Other Ambulatory Visit: Payer: Self-pay | Admitting: Family Medicine

## 2024-01-20 VITALS — BP 128/75 | HR 84 | Temp 97.7°F | Ht 67.0 in | Wt 198.6 lb

## 2024-01-20 DIAGNOSIS — R9389 Abnormal findings on diagnostic imaging of other specified body structures: Secondary | ICD-10-CM

## 2024-01-20 DIAGNOSIS — R942 Abnormal results of pulmonary function studies: Secondary | ICD-10-CM

## 2024-01-20 DIAGNOSIS — Z87891 Personal history of nicotine dependence: Secondary | ICD-10-CM

## 2024-01-20 DIAGNOSIS — M7989 Other specified soft tissue disorders: Secondary | ICD-10-CM

## 2024-01-20 DIAGNOSIS — R911 Solitary pulmonary nodule: Secondary | ICD-10-CM | POA: Diagnosis not present

## 2024-01-20 DIAGNOSIS — Z8673 Personal history of transient ischemic attack (TIA), and cerebral infarction without residual deficits: Secondary | ICD-10-CM

## 2024-01-20 NOTE — Progress Notes (Addendum)
 History of Present Illness Andre [name] is a 73 y.o. male  former smoker ( Quit 2021 with a 50 pack year smoking history) referred for abnormal screening CT Chest done by patient's PCP.    PMH - Hypertension - Stroke in 2020 - Hyperlipidemia - History of heavy smoking (20 pack year history, quit 2023 )   01/20/2024 Discussed the use of AI scribe software for clinical note transcription with the patient, who gave verbal consent to proceed.  Synopsis Seen 08/2023 for evaluation of a pulmonary nodule first noted on a low-dose screening CT ordered by the patient's primary care physician.  The low-dose CT which was done 07/01/2023 and read 07/24/2023 showed a part solid right pulmonary nodule measuring 20.6 mm with a 12 mm solid component in the right lower lobe and a 15.2 mm with a 7 mm solid component in the posterior right middle lobe both of which are new from 2014 CT angio study.  These were noted as being suspicious for a multifocal lung adenocarcinoma..  The patient is a former smoker, quit in 2023 with a 50-pack-year smoking history.   Plan after the 08/2023 visit was for a PET scan, which was done  09/09/2023, and read 09/14/2023.This showed minimal uptake in the ground glass nodule of concern, but did show significant abnormal uptake within a small soft tissue nodule in the upper mediastinum just caudal to the right thyroid  lobe near the midline. This could be an abnormal mediastinal lymph node versus a exophytic thyroid  lesion. Recommendation was for further follow up. I spoke with Dr. Shelah and he recommended a dedicated neck CT scan with contrast to further characterize whether or not the PET scan finding is an abnormal mediastinal lymph node or an exophytic thyroid  lesion. An order was placed 09/17/2023. I am unsure why it took so long to get the scan completed.   He is here today to review the results and to determine next best steps in plan  of care.  History of Present Illness Pt.  Presents for follow up after CT Soft Tissue of the neck to better determine if this is a lymph node vs thyriod noted on the PET scan as being hypermetabolic. We have reviewed the results together. I explained there is a soft tissue mass that is 1 cm in size that correlated with the hypermetabolic lesion noted on the PET scan. The soft tissue nodule in the superior mediastinum is separate from the thyroid  gland, most likely a lymph node. We also discussed that there is soft tissue of the right oropharynx that also showed some hypermetabolism on the PET scan. This will need further investigation after we determine what the lymph node is.   I messaged Dr. Shelah who said he would be unable to reach the lymph node of concern by FOB. I have referred the patient to thoracic surgery for tissue sampling.   Pt, verbalized understanding. He states he has been doing well. No hemoptysis or unexplained weight loss.      Test Results: 01/01/2024 CT Soft tissue neck with contrast There is asymmetric soft tissue density in the right oropharynx in the tonsillar fossa. Salivary glands: The parotid and submandibular glands are normal   Thyroid : Normal Lymph nodes: There is a 1 cm soft tissue nodule in the superior mediastinum just anterior to the left innominate vein and inferior to the thyroid . It is not contiguous with the thyroid .  The hypermetabolic lesion noted on the CT PET study correlates with a  1 cm soft tissue nodule in the superior mediastinum separate from the thyroid  gland, most likely a lymph node 2. There is asymmetric soft tissue in the right oropharynx. This area showed slight asymmetric metabolism on the previous CT PET study. It may represent an oropharyngeal carcinoma. Further investigation needed.  LDCT Chest 07/01/2023  Lung-RADS 4B, suspicious. Additional imaging evaluation or consultation with Pulmonology or Thoracic Surgery recommended. Similar part solid right pulmonary nodules  measuring 20.6 mm with 12.0 mm solid component in the posteromedial right lower lobe and 15.2 mm with 7.0 mm solid component in the posterior right middle lobe, both new from 2014 chest CT angiogram study, suspicious for multifocal lung adenocarcinoma.    Latest Ref Rng & Units 04/17/2021    1:17 AM 04/16/2021    4:25 AM 04/15/2021    2:55 AM  CBC  WBC 4.0 - 10.5 K/uL 7.1  8.6  11.0   Hemoglobin 13.0 - 17.0 g/dL 9.6  9.9  9.2   Hematocrit 39.0 - 52.0 % 30.7  30.5  29.5   Platelets 150 - 400 K/uL 349  307  273        Latest Ref Rng & Units 04/17/2021    1:17 AM 04/16/2021    4:25 AM 04/15/2021    2:55 AM  BMP  Glucose 70 - 99 mg/dL 86  84  78   BUN 8 - 23 mg/dL 29  22  20    Creatinine 0.61 - 1.24 mg/dL 8.99  8.90  8.85   Sodium 135 - 145 mmol/L 136  138  138   Potassium 3.5 - 5.1 mmol/L 4.2  3.8  3.9   Chloride 98 - 111 mmol/L 106  105  106   CO2 22 - 32 mmol/L 25  25  24    Calcium  8.9 - 10.3 mg/dL 8.9  8.8  8.5     BNP    Component Value Date/Time   BNP 45.7 12/30/2019 0510    ProBNP No results found for: PROBNP  PFT No results found for: FEV1PRE, FEV1POST, FVCPRE, FVCPOST, TLC, DLCOUNC, PREFEV1FVCRT, PSTFEV1FVCRT  CT SOFT TISSUE NECK W CONTRAST Result Date: 01/07/2024 CLINICAL DATA:  Abnormal CT PET EXAM: CT NECK WITH CONTRAST TECHNIQUE: Multidetector CT imaging of the neck was performed using the standard protocol following the bolus administration of intravenous contrast. RADIATION DOSE REDUCTION: This exam was performed according to the departmental dose-optimization program which includes automated exposure control, adjustment of the mA and/or kV according to patient size and/or use of iterative reconstruction technique. CONTRAST:  75mL OMNIPAQUE  IOHEXOL  300 MG/ML  SOLN COMPARISON:  CT PET Sep 09, 2023 FINDINGS: Pharynx: There is asymmetric soft tissue density in the right oropharynx in the tonsillar fossa. The hypopharynx is normal.  The nasopharynx  is normal. Oral cavity/floor of mouth: Normal Larynx: Normal Salivary glands: The parotid and submandibular glands are normal Thyroid : Normal Lymph nodes: There is a 1 cm soft tissue nodule in the superior mediastinum just anterior to the left innominate vein and inferior to the thyroid . It is not contiguous with the thyroid . Vascular: There is extensive atherosclerotic calcification involving the carotid arteries, intracranial vertebrobasilar vessels and intracranial carotid arteries Limited intracranial: No other significant abnormality Visualized orbits: No significant abnormality Mastoids and visualized paranasal sinuses: No significant abnormality Skeleton: There is cervical spondylosis Other: None IMPRESSION: 1. The hypermetabolic lesion noted on the CT PET study correlates with a 1 cm soft tissue nodule in the superior mediastinum separate from the thyroid  gland, most likely  a lymph node 2. There is asymmetric soft tissue in the right oropharynx. This area showed slight asymmetric metabolism on the previous CT PET study. It may represent an oropharyngeal carcinoma. Further investigation needed. Electronically Signed   By: Nancyann Burns M.D.   On: 01/07/2024 09:20     Past medical hx Past Medical History:  Diagnosis Date   Benign essential HTN 05/09/2015   Benign prostate hyperplasia    COVID-19 virus infection 12/30/2019   Furuncle of face 08/07/2017   History of blood transfusion    qwhen I got my jaw broke (05/05/2017)   Hypertension    Localized swelling, mass or lump of neck 06/17/2017   Polysubstance abuse (HCC)    including alcohol, tobacco, and cocaine /notes 05/04/2017   Stroke (HCC) 11/12012   Stroke (HCC) 05/04/2017   Recurrent pontine stroke with resulting right hemiplegia and right cranial nerve palsies/notes 05/04/2017   Tobacco use disorder    UTI (urinary tract infection) 04/13/2021   Weakness      Social History   Tobacco Use   Smoking status: Former    Current  packs/day: 0.00    Average packs/day: 1 pack/day for 50.0 years (50.0 ttl pk-yrs)    Types: Cigarettes    Quit date: 2021    Years since quitting: 4.7    Passive exposure: Current   Smokeless tobacco: Never  Vaping Use   Vaping status: Former  Substance Use Topics   Alcohol use: Not Currently    Alcohol/week: 42.0 standard drinks of alcohol    Types: 42 Cans of beer per week    Comment: 05/05/2017 40oz beer/day; maybe more   Drug use: Not Currently    Types: Cocaine, Marijuana    Andre [name] reports that he quit smoking about 4 years ago. His smoking use included cigarettes. He has a 50 pack-year smoking history. He has been exposed to tobacco smoke. He has never used smokeless tobacco. He reports that he does not currently use alcohol after a past usage of about 42.0 standard drinks of alcohol per week. He reports that he does not currently use drugs after having used the following drugs: Cocaine and Marijuana.  Tobacco Cessation: Counseling given: Not Answered Former smoker with a 50 pack year smoking history, quit 2021  Past surgical hx, Family hx, Social hx all reviewed.  Current Outpatient Medications on File Prior to Visit  Medication Sig   amLODipine  (NORVASC ) 10 MG tablet TAKE 1 TABLET(10 MG) BY MOUTH DAILY   aspirin  81 MG chewable tablet Chew 1 tablet (81 mg total) by mouth daily.   atorvastatin  (LIPITOR ) 80 MG tablet TAKE 1 TABLET(80 MG) BY MOUTH DAILY   clopidogrel  (PLAVIX ) 75 MG tablet TAKE 1 TABLET(75 MG) BY MOUTH DAILY   folic acid  (FOLVITE ) 1 MG tablet Take 1 tablet (1 mg total) by mouth daily.   mirabegron  ER (MYRBETRIQ ) 25 MG TB24 tablet Take 1 tablet (25 mg total) by mouth daily at 12 noon.   multivitamin (PROSIGHT) TABS tablet Take 1 tablet by mouth daily.   senna (SENOKOT) 8.6 MG tablet Take 1 tablet by mouth 2 (two) times daily.   sildenafil (VIAGRA) 50 MG tablet Take 50 mg by mouth daily as needed for erectile dysfunction.   tamsulosin  (FLOMAX ) 0.4 MG CAPS  capsule TAKE 1 CAPSULE(0.4 MG) BY MOUTH DAILY   thiamine  100 MG tablet Take 1 tablet (100 mg total) by mouth daily.   No current facility-administered medications on file prior to visit.  No Known Allergies  Review Of Systems:  Constitutional:   No  weight loss, night sweats,  Fevers, chills, fatigue, or  lassitude.  HEENT:   No headaches,  Difficulty swallowing,  Tooth/dental problems, or  Sore throat,                No sneezing, itching, ear ache, nasal congestion, post nasal drip,   CV:  No chest pain,  Orthopnea, PND, swelling in lower extremities, anasarca, dizziness, palpitations, syncope.   GI  No heartburn, indigestion, abdominal pain, nausea, vomiting, diarrhea, change in bowel habits, loss of appetite, bloody stools.   Resp: No shortness of breath with exertion or at rest.  No excess mucus, no productive cough,  No non-productive cough,  No coughing up of blood.  No change in color of mucus.  No wheezing.  No chest wall deformity  Skin: no rash or lesions.  GU: no dysuria, change in color of urine, no urgency or frequency.  No flank pain, no hematuria   MS:  No joint pain or swelling.  No decreased range of motion.  No back pain.  Psych:  No change in mood or affect. No depression or anxiety.  No memory loss.   Vital Signs BP 128/75   Pulse 84   Temp 97.7 F (36.5 C) (Oral)   Ht 5' 7 (1.702 m)   Wt 198 lb 9.6 oz (90.1 kg)   SpO2 97%   BMI 31.11 kg/m    Physical Exam:  General- No distress,  A&Ox3, pleasant ENT: No sinus tenderness, TM clear, pale nasal mucosa, no oral exudate,no post nasal drip, no LAN Cardiac: S1, S2, regular rate and rhythm, no murmur Chest: No wheeze/ rales/ dullness; no accessory muscle use, no nasal flaring, no sternal retractions Abd.: Soft Non-tender, ND, BS +, Body mass index is 31.11 kg/m.  Ext: No clubbing cyanosis, edema, no obvious deformities Neuro: Walks with a cane, RUL and RLE 4/5, 3+/5 respectively, MAE x 4, A&O,  appropriate Skin: No rashes, warm and dry, no obvious skin lesions  Psych: normal mood and behavior   Assessment/Plan  Assessment and Plan Assessment & Plan Soft Tissue nodule in the superior mediastinum just anterior to the left innominate vein and inferior to the thyroid . Hypermetabolic on PET Former smoker  Oropharynx also hypermetabolic on PET Plan Referred to thoracic surgery for tissue sampling Follow up in 1 months to ensure appropriate follow up.   AVS 01/20/2024 The CT Soft Tissue Neck did show there is a soft tissue mass about 1 cm in size.  This was the area of concern on the PET scan. We need to do a biopsy of this area to determine what it is. Dr. Shelah does not feel this is reachable by Bronchoscopy. I have asked him if this needs to be done by IR or by thoracic surgery. Once he lets me know I will call and let you know, and I will also make the appropriate referral, as I do not want any delay in your care.  We will make sure we get you to the right person to do the procedure.  Call if you need us  for anything at all.  Please contact office for sooner follow up if symptoms do not improve or worsen or seek emergency care .  I called the patient after I spoke with Dr. Shelah, and left a message letting him know I referred him to thoracic surgery for biopsy, and to expect a phone call to get  this scheduled.    I spent 20 minutes dedicated to the care of this patient on the date of this encounter to include pre-visit review of records, face-to-face time with the patient discussing conditions above, post visit ordering of testing, clinical documentation with the electronic health record, making appropriate referrals as documented, and communicating necessary information to the patient's healthcare team.    Lauraine JULIANNA Lites, NP 01/20/2024  9:37 AM

## 2024-01-20 NOTE — Patient Instructions (Addendum)
 It is good to see you today. The CT Soft Tissue Neck did show there is a soft tissue mass about 1 cm in size.  This was the area of concern on the PET scan. We need to do a biopsy of this area to determine what it is. Dr. Shelah does not feel this is reachable by Bronchoscopy. I have asked him if this needs to be done by IR or by thoracic surgery. Once he lets me know I will call and let you know, and I will also make the appropriate referral, as I do not want any delay in your care.  We will make sure we get you to the right person to do the procedure.  Call if you need us  for anything at all.  Please contact office for sooner follow up if symptoms do not improve or worsen or seek emergency care

## 2024-01-22 ENCOUNTER — Other Ambulatory Visit: Payer: Self-pay

## 2024-01-22 ENCOUNTER — Ambulatory Visit: Admitting: Family Medicine

## 2024-01-22 VITALS — BP 130/75 | HR 99 | Temp 98.1°F | Ht 67.0 in | Wt 197.2 lb

## 2024-01-22 DIAGNOSIS — R296 Repeated falls: Secondary | ICD-10-CM

## 2024-01-22 DIAGNOSIS — F1010 Alcohol abuse, uncomplicated: Secondary | ICD-10-CM

## 2024-01-22 DIAGNOSIS — I6930 Unspecified sequelae of cerebral infarction: Secondary | ICD-10-CM

## 2024-01-22 DIAGNOSIS — Z8673 Personal history of transient ischemic attack (TIA), and cerebral infarction without residual deficits: Secondary | ICD-10-CM

## 2024-01-22 DIAGNOSIS — I10 Essential (primary) hypertension: Secondary | ICD-10-CM

## 2024-01-22 DIAGNOSIS — R948 Abnormal results of function studies of other organs and systems: Secondary | ICD-10-CM | POA: Diagnosis not present

## 2024-01-22 MED ORDER — FOLIC ACID 1 MG PO TABS: 1.0000 mg | ORAL_TABLET | Freq: Every day | ORAL | 1 refills | Status: DC

## 2024-01-22 NOTE — Assessment & Plan Note (Signed)
 Following up with CT surgery on 10/07 for probable biopsy of mediastinal mass. - Provided patient with detailed instructions and address for this appointment, emphasized importance of attending

## 2024-01-22 NOTE — Assessment & Plan Note (Signed)
 Patient has a significant antalgic gait, is unable to ambulate without walker and reportedly has fallen in the shower.  He does require assistance with ADLs at home.  While on exam, patient does appear to have equal strength bilaterally, he does have some notable right-sided deficits while ambulating.  Patient reports his issues have been persistent but stable since his CVA in 2017.  No acute changes. Believe patient is significantly deconditioned and would benefit from physical therapy. - Completed Medicaid PCS form and placed in outgoing fax box, see media tab for photos - Provided DME shower chair and rolling four-wheel walker - Patient declines PT, declines geriatrics clinic follow-up (has previously had a no-show to this clinic)

## 2024-01-22 NOTE — Progress Notes (Signed)
 SUBJECTIVE:   CHIEF COMPLAINT / HPI:  Andre [name] is a 73 y.o. male with a pertinent past medical history of  HTN, CVA, HLD, BPH, and ~50 pack-year smoking history (quit 2021) currently undergoing workup for mediastinal soft tissue nodule with increased uptake on PET.  Patient is now presenting to the clinic for paperwork completion.  Soft tissue mass PET 5/12 showed significant abnormal uptake in soft tissue nodule in the superior mediastinum just anterior to the left innominate vein and inferior to the thyroid . CT soft tissue neck on 8/29 showed 1 cm soft tissue nodule in the superior mediastinum separate from the thyroid  gland, most likely a lymph node. However, CT also showed asymmetric soft tissue in the right oropharynx which had shown slight asymmetric metabolism on the previous PET which may represent an oropharyngeal carcinoma. Patient has been scheduled with CT surgery on 10/07 for likely biopsy and further evaluation. Patient is following with Pulmonology, Dr. Shelah.  Hx CVA Falls Patient presents today requesting DME for rolling walker and shower chair. Patient also request completion of Medicaid PCS form on the basis that he is having difficulty in the house with ambulation and showering. Patient has that he has fallen multiple times in the shower recently. Denies hitting his head, losing consciousness after these falls.  PERTINENT PMH / PSH: HTN, CVA, HLD, and BPH  *Remainder reviewed in problem list.   OBJECTIVE:   BP 130/75   Pulse 99   Temp 98.1 F (36.7 C)   Ht 5' 7 (1.702 m)   Wt 197 lb 3.2 oz (89.4 kg)   SpO2 93%   BMI 30.89 kg/m   General: Age-appropriate, resting comfortably in chair, NAD, alert and at baseline. Cardiovascular: Regular rate and rhythm. Normal S1/S2. No murmurs, rubs, or gallops appreciated. 2+ radial pulses. Pulmonary: Clear bilaterally to ascultation. No wheezes, crackles, or rhonchi. Normal WOB on room air. No accessory  muscle use. Extremities: No peripheral edema bilaterally. Capillary refill <2 seconds. MSK: Slow, antalgic gait.  Favors left leg.  Significant difficulty ambulating without walker, poor balance.  Significantly prolonged get up and go test.  Unable to stand from chair without using arms for support.  Neurological examination: MS:  Awake, alert, interactive. Normal eye contact, answered the questions appropriately, speech was fluent, normal comprehension.  Alert to self, place, month, and situation. Attention and concentration appear mildly deficient, but not severe. Cranial nerves:    CN II:  PERRLA.  Visual fields intact to confrontation.  Blinks normally to threat bilaterally. CNs III, IV, VI:  Full extraocular eye movement without nystagmus.  No ptosis or diplopia. CN V:  Facial sensation is normal, no weakness of masticatory muscles.  CN VII:  No facial weakness or asymmetry.  CN VIII:  Auditory acuity grossly normal. CNs XI/X:  Palate elevation symmetric. CN XI:  Normal sternocleidomastoid and trapezius strength. CN XII: Tongue is midline without atrophy or fasciculations. MOTOR:  Strength 4/5 RUE, 4/5 LUE, 4/5 RLE, 4/5 LLE. COORDINATION:  Intact finger-to-nose, no tremor. SENSATION:  Intact to light touch over all four extremities.  Romberg negative.   ASSESSMENT/PLAN:   Assessment & Plan Essential hypertension Well-controlled today. - Continue amlodipine  10 mg daily History of CVA (cerebrovascular accident) Sequelae of cerebral infarction Falls Patient has a significant antalgic gait, is unable to ambulate without walker and reportedly has fallen in the shower.  He does require assistance with ADLs at home.  While on exam, patient does appear to have equal strength bilaterally,  he does have some notable right-sided deficits while ambulating.  Patient reports his issues have been persistent but stable since his CVA in 2017.  No acute changes. Believe patient is significantly  deconditioned and would benefit from physical therapy. - Completed Medicaid PCS form and placed in outgoing fax box, see media tab for photos - Provided DME shower chair and rolling four-wheel walker - Patient declines PT, declines geriatrics clinic follow-up (has previously had a no-show to this clinic) Abnormal PET scan of mediastinum Following up with CT surgery on 10/07 for probable biopsy of mediastinal mass. - Provided patient with detailed instructions and address for this appointment, emphasized importance of attending  Follow-up recommended in 3 months.  Revisit PT at that time.  Andre Agramonte Toma, MD [facility]

## 2024-01-22 NOTE — Patient Instructions (Addendum)
 It was great to see you today! Thank you for choosing [facility] for your primary care.  Today we addressed: 1. History of CVA (cerebrovascular accident) I have filled out your forms.  Please take your DME forms to a medical supply store.  Please send your completed Medicaid form back to the [facility] office you got it from.  Please make sure to go to your surgery appointment on October 7th at:  [facility] Elspeth BIR[facility]. [facility] [address], [place], [place] [address] [phone]  You should return to our clinic in 3 months to follow up.  Thank you for coming to see us  at [facility] and for the opportunity to care for you! Shadman Tozzi, MD 01/22/2024, 2:39 PM

## 2024-01-22 NOTE — Assessment & Plan Note (Signed)
 Well controlled today. Continue amlodipine 10mg  daily.

## 2024-01-27 ENCOUNTER — Telehealth: Payer: Self-pay | Admitting: Acute Care

## 2024-01-27 NOTE — Telephone Encounter (Signed)
 Copied from CRM #[id number]. Topic: General - Other >> Jan 27, 2024  8:39 AM Benton O wrote: Reason for RMF:[id number] is calling because  it was something checked on the form wrong for medicaid . he came to doctor appointment he trying to get personal care services . but the form is filled out completely and at the bottom it say if he is medically stable and they said no but it should say yes because he is . all they need to do is put yes and intial it and date it and fax back  The fax number on this form .  [phone] please give patient a call concerning this

## 2024-01-28 NOTE — Telephone Encounter (Signed)
 Copied from CRM #[id number]. Topic: Medical Record Request - Other >> Jan 28, 2024  8:47 AM Devaughn RAMAN wrote: Reason for CRM: Patient and Ellouise with [facility] are calling because an incorrect question checked on the forms that were previously filled out and sent to [facility]. The patient is trying to get personal care services however on the form at the bottom it says he is not medically stable. Ellouise stated the patient is medically stable. Ellouise stated all they need to do is put yes and intial it and date it and fax it once filled out. Ellouise said the patient is medically stable and the word yes can be written, signed, and place the providers initials with the date beside it or it will be ineligible. It can be faxed back to the information below. Patient would like a callback once this has been completed.   [place] Phone-[phone] Fax-[phone]

## 2024-01-29 NOTE — Telephone Encounter (Signed)
 I contacted [name] to let her know that the form has been corrected and its ready for pick up for Pt Andre [name].

## 2024-01-29 NOTE — Telephone Encounter (Signed)
 Kendra placed in PCP box   Harlene Reiter, CMA

## 2024-01-29 NOTE — Telephone Encounter (Signed)
 Pt social worker came to dropped off another form to be corrected. The last one was marked no on 01/22/2024 and it needs to be marked yes at the bottom of the page. I left a sticky note with directions. Pt gave permission for Tina [place] to pick up form when its ready. Thank !

## 2024-02-09 ENCOUNTER — Encounter: Payer: Self-pay | Admitting: Thoracic Surgery (Cardiothoracic Vascular Surgery)

## 2024-02-09 ENCOUNTER — Ambulatory Visit
Attending: Thoracic Surgery (Cardiothoracic Vascular Surgery) | Admitting: Thoracic Surgery (Cardiothoracic Vascular Surgery)

## 2024-02-09 ENCOUNTER — Other Ambulatory Visit: Payer: Self-pay | Admitting: Thoracic Surgery (Cardiothoracic Vascular Surgery)

## 2024-02-09 VITALS — BP 145/80 | HR 82 | Resp 20 | Ht 67.0 in | Wt 200.0 lb

## 2024-02-09 DIAGNOSIS — J9859 Other diseases of mediastinum, not elsewhere classified: Secondary | ICD-10-CM | POA: Diagnosis present
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
## 2024-02-11 LAB — PTH, INTACT AND CALCIUM
Calcium: 9.2 mg/dL (ref 8.6–10.2)
PTH: 33 pg/mL (ref 15–65)

## 2024-02-17 ENCOUNTER — Encounter (INDEPENDENT_AMBULATORY_CARE_PROVIDER_SITE_OTHER): Payer: Self-pay

## 2024-02-17 ENCOUNTER — Ambulatory Visit (INDEPENDENT_AMBULATORY_CARE_PROVIDER_SITE_OTHER)

## 2024-02-17 VITALS — BP 135/80 | HR 78 | Ht 67.0 in | Wt 200.0 lb

## 2024-02-17 DIAGNOSIS — J358 Other chronic diseases of tonsils and adenoids: Secondary | ICD-10-CM | POA: Diagnosis not present

## 2024-02-17 DIAGNOSIS — J392 Other diseases of pharynx: Secondary | ICD-10-CM
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
## 2024-02-19 ENCOUNTER — Ambulatory Visit (INDEPENDENT_AMBULATORY_CARE_PROVIDER_SITE_OTHER)

## 2024-02-19 ENCOUNTER — Telehealth: Payer: Self-pay | Admitting: Family Medicine

## 2024-02-19 DIAGNOSIS — J359 Chronic disease of tonsils and adenoids, unspecified: Secondary | ICD-10-CM

## 2024-02-19 DIAGNOSIS — J392 Other diseases of pharynx: Secondary | ICD-10-CM

## 2024-02-19 NOTE — Telephone Encounter (Signed)
 Received a call from [facility] about surgical clearance form that was just faxed over for patient today. Asks if form can be completed ASAP because his insurance runs out soon and they want to get his surgery done before then.
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
## 2024-02-24 ENCOUNTER — Ambulatory Visit (INDEPENDENT_AMBULATORY_CARE_PROVIDER_SITE_OTHER): Admitting: Family Medicine

## 2024-02-24 VITALS — BP 133/58 | HR 93 | Temp 97.8°F | Ht 67.0 in | Wt 198.8 lb

## 2024-02-24 DIAGNOSIS — R3911 Hesitancy of micturition: Secondary | ICD-10-CM

## 2024-02-24 DIAGNOSIS — Z01818 Encounter for other preprocedural examination: Secondary | ICD-10-CM

## 2024-02-24 DIAGNOSIS — Z23 Encounter for immunization: Secondary | ICD-10-CM

## 2024-02-24 DIAGNOSIS — J392 Other diseases of pharynx: Secondary | ICD-10-CM | POA: Diagnosis not present

## 2024-02-24 DIAGNOSIS — R39198 Other difficulties with micturition: Secondary | ICD-10-CM

## 2024-02-24 DIAGNOSIS — N401 Enlarged prostate with lower urinary tract symptoms: Secondary | ICD-10-CM | POA: Diagnosis not present

## 2024-02-24 NOTE — Patient Instructions (Addendum)
 It was great to see you today! Thank you for choosing [facility] for your primary care.  Today we addressed: 1. Preoperative clearance Please make sure to hold your Plavix  starting today for 7 days until your surgery on 10/28.  Please also hold your aspirin  for 24 hours prior to the surgery.  Please make sure to discuss this with home health who comes by on Friday.  We are checking some labs today, including prostate antigen, metabolic panel, and blood count.  You will get a MyChart message or a letter if results are normal. Otherwise, you will get a call from us .  You should return to our clinic in 3 months.  Thank you for coming to see us  at [facility] and for the opportunity to care for you! Toma, Jazzman Loughmiller, MD 02/24/2024, 10:20 AM

## 2024-02-24 NOTE — Progress Notes (Signed)
 SUBJECTIVE:  Andre [name] is here for a Pre-operative physical at the request of Dr. Greggory with [facility].   He is having tonsillectomy surgery on 03/01/24 for possible oropharyngeal mass as below.  Oropharyngeal mass Screening lung cancer CT in April 2025 showed lung nodules. Follow-up PET/CT showed uptake in lower neck and some uptake in right oropharynx. Lost to follow up briefly. CT neck on 01/01/24 showed asymmetric soft tissue in oropharynx correlating to PET with concern that there may be a lesion here.  Of note, patient does not know his medicines and keeps them all together.  He has Home Health, who manages his medicines and she is coming on Friday 10/24.  Anticoagulation: Currently taking Plavix  75 mg daily and ASA 81 mg daily after CVA in 2017.  Personal or family hx of adverse outcome to anesthesia? Yes  Chipped, cracked, missing, or loose teeth? No , patient has no teeth Decreased ROM of neck? No  Able to walk up 2 flights of stairs without becoming significantly short of breath or having chest pain? Yes   Revised Goldman Criteria: High Risk Surgery (intraperitoneal, intrathoracic, aortic): No  Ischemic heart disease (Prior MI, +excercise stress test, angina, nitrate use, Qwave): No  History of congestive heart failure: No  History of cerebrovascular disease (TIA or stroke): Yes in 2017 Diabetes requiring preoperative treatment with insulin: No  Preoperative Cr >2.0: Checking today  Revised Cardiac Risk Index Scoring - risk for death, MI, or cardiac arrest No risk factors -- 0.4% One risk factor -- 0.9%  Two risk factors -- 6.6%  Three or more risk factors -- >11%   Review of Systems: Constitutional:  no unexpected change in weight, no weakness, no unexplained fevers, sweats, or chills Eye: no recent significant change in vision Ear: no hearing loss Nose/Mouth/Throat: no dental complaints Neck/Thyroid : no lumps or masses Pulmonary: no chronic  cough, sputum, or hemoptysis and no shortness of breath Cardiovascular: no exercise intolerance, no chest pain Gastrointestinal: no abdominal pain and no change in bowel habits GU: negative for dysuria, frequency, and incontinence Musculoskeletal/Extremities: no peripheral edema Skin/Integumentary ROS: no abnormal skin lesions reported Neurologic: no numbness, tingling, or tremor  OBJECTIVE:   Vitals:   02/24/24 0959  BP: (!) 133/58  Pulse: 93  Temp: 97.8 F (36.6 C)  SpO2: 99%  Weight: 198 lb 12.8 oz (90.2 kg)  Height: 5' 7 (1.702 m)   Body mass index is 31.14 kg/m.  General:  well developed, well nourished, NAD Skin:  warm, no pallor or diaphoresis Head:  normocephalic, atraumatic Eyes:  pupils equal and round, sclera anicteric without injection Throat/Pharynx:  lips and gingiva without lesion; tongue and uvula midline; non-inflamed pharynx; no exudates or postnasal drainage; no teeth Neck: neck supple without adenopathy, thyromegaly, or masses, no bruits, no jugular venous distention Lungs: clear to auscultation, breath sounds equal bilaterally, no respiratory distress Cardio: regular rate and rhythm without murmurs Abdomen: abdomen soft, nontender; bowel sounds normal; no masses, hepatomegaly or splenomegaly Musculoskeletal: ambulates with cane, favors left side, 4/5 R side strength compared to L Extremities:  no clubbing, cyanosis, or edema, no deformities, no skin discoloration Neuro:  mildly antalgic gait favoring left side; alert and oriented to person, place, and time; notable psychomotor slowing Psych: Age appropriate judgment and insight; normal mood   ASSESSMENT/PLAN:   Assessment & Plan Preoperative clearance Oropharyngeal mass Possible soft tissue mass in oropharynx correlating the PET concern for lesion.  Tonsillectomy scheduled for 10/28.  Pending updated  Cr, RCRI is 1.  Given that patient has a history of CVA and possible active cancer, he does have a  higher VTE risk.  However, after shared decision-making discussion of risks and benefits, patient will hold Plavix  leading up to surgery. - Hold Plavix  for 7 days starting today, restart 10/30 (24-48 hours after procedure) - Hold ASA 81 mg for 24 hours prior to surgery, restart 10/30 (24-48 hours after procedure) - Updated BMP, CBC today - Sending surgical clearance and attached note to ENT specialist Dr. Greggory - Patient is not a good historian and does not understand his medicines very well, there is a chance he will take Plavix  accidentally; did bring home health aide into appointment room to discuss together with her and increase chance of patient adherence Benign prostatic hyperplasia with urinary hesitancy Difficulty urinating - Continue tamsulosin  0.4 mg - PSA today   Orders as above. EKG - Normal EKG, normal sinus rhythm, unchanged from prior EKG in Dec 2022 except V2 now with Q waves, notable artifact throughout anterior leads. The above laboratory work was ordered and will be sent with this physical. Pending the above workup, the patient is deemed low cardiac risk for the proposed procedure.  The patient voiced understanding and agreement to the plan.  Christa Fasig Toma, MD PGY-2, [facility] 02/24/24, 10:09 AM

## 2024-02-24 NOTE — Assessment & Plan Note (Signed)
-   Continue tamsulosin  0.4 mg - PSA today

## 2024-02-25 LAB — CBC
Hematocrit: 45.5 % (ref 37.5–51.0)
Hemoglobin: 14.3 g/dL (ref 13.0–17.7)
MCH: 28.9 pg (ref 26.6–33.0)
MCHC: 31.4 g/dL — ABNORMAL LOW (ref 31.5–35.7)
MCV: 92 fL (ref 79–97)
Platelets: 200 x10E3/uL (ref 150–450)
RBC: 4.95 x10E6/uL (ref 4.14–5.80)
RDW: 11.8 % (ref 11.6–15.4)
WBC: 5.5 x10E3/uL (ref 3.4–10.8)

## 2024-02-25 LAB — BASIC METABOLIC PANEL WITH GFR
BUN/Creatinine Ratio: 12 (ref 10–24)
BUN: 17 mg/dL (ref 8–27)
CO2: 20 mmol/L (ref 20–29)
Calcium: 9.1 mg/dL (ref 8.6–10.2)
Chloride: 106 mmol/L (ref 96–106)
Creatinine, Ser: 1.44 mg/dL — ABNORMAL HIGH (ref 0.76–1.27)
Glucose: 73 mg/dL (ref 70–99)
Potassium: 4.3 mmol/L (ref 3.5–5.2)
Sodium: 143 mmol/L (ref 134–144)
eGFR: 52 mL/min/1.73 — ABNORMAL LOW (ref >59)

## 2024-02-25 LAB — PSA: Prostate Specific Ag, Serum: 2.9 ng/mL (ref 0.0–4.0)

## 2024-02-26 ENCOUNTER — Ambulatory Visit (HOSPITAL_COMMUNITY): Payer: Self-pay | Admitting: Family Medicine

## 2024-02-26 NOTE — Telephone Encounter (Signed)
 Labs significant for elevated creatinine, mild AKI.  Discussed with attending Dr. McDiarmid prior.  Plan is to move forward with surgery, encourage increased PO intake leading up to procedure on 10/28.  Called patient, verified DOB.  Discussed plan, encouraged increased fluid intake.  Patient endorses not drinking enough water leading up to BMP.  Dr. Greggory, patient is stable for surgery from my perspective.

## 2024-02-29 ENCOUNTER — Encounter (HOSPITAL_COMMUNITY): Payer: Self-pay

## 2024-02-29 ENCOUNTER — Other Ambulatory Visit: Payer: Self-pay | Admitting: Family Medicine

## 2024-02-29 NOTE — Progress Notes (Addendum)
 SDW CALL  Patient was given pre-op instructions over the phone. The opportunity was given for the patient to ask questions. No further questions asked. Patient verbalized understanding of instructions given.   PCP - Dr. Claudetta Silence Cardiologist - denies  PPM/ICD - denies  Chest x-ray - denies EKG - 02/24/24 Stress Test - denies ECHO - denies Cardiac Cath - denies  Sleep Study - denies  NO DM  Last dose of GLP1 agonist-  n/a GLP1 instructions:  n/a  Blood Thinner Instructions:  last dose of Plavix  - 10/20 Aspirin  Instructions: last dose of Aspirin  10/20  ERAS Protcol - NPO PRE-SURGERY Ensure or G2-  n/a  COVID TEST-  n/a   Anesthesia review: yes - history of stroke  Patient denies shortness of breath, fever, cough and chest pain over the phone call   All instructions explained to the patient, with a verbal understanding of the material. Patient agrees to go over the instructions while at home for a better understanding.    Patient is unsure if he has anyone to stay with him for the first 24 hours after surgery.  He has made an attempt to find someone.  I have contacted Dr. Greggory and her office with no response.    Update: 1635 - per the office, patient will be Outpatient in Bed.  Patient was also made aware.

## 2024-02-29 NOTE — Anesthesia Preprocedure Evaluation (Signed)
 Anesthesia Evaluation  Patient identified by MRN, date of birth, ID band Patient awake    Reviewed: Allergy & Precautions, NPO status , Patient's Chart, lab work & pertinent test results  History of Anesthesia Complications Negative for: history of anesthetic complications  Airway Mallampati: III  TM Distance: >3 FB Neck ROM: Full    Dental  (+) Dental Advisory Given   Pulmonary former smoker   breath sounds clear to auscultation       Cardiovascular hypertension, Pt. on medications (-) angina (-) Past MI  Rhythm:Regular   1. Left ventricular ejection fraction, by estimation, is 50 to 55%. The  left ventricle has low normal function. The left ventricle has no regional  wall motion abnormalities. There is mild left ventricular hypertrophy.  Left ventricular diastolic  parameters are indeterminate.   2. Right ventricular systolic function is normal. The right ventricular  size is normal. There is normal pulmonary artery systolic pressure. The  estimated right ventricular systolic pressure is 27.9 mmHg.   3. The mitral valve is abnormal. Trivial mitral valve regurgitation.   4. The aortic valve is tricuspid. Aortic valve regurgitation is trivial.  Aortic valve sclerosis/calcification is present, without any evidence of  aortic stenosis.   5. The inferior vena cava is normal in size with <50% respiratory  variability, suggesting right atrial pressure of 8 mmHg.     Neuro/Psych  PSYCHIATRIC DISORDERS      CVA, Residual Symptoms    GI/Hepatic negative GI ROS, Neg liver ROS,,,  Endo/Other  negative endocrine ROS    Renal/GU negative Renal ROS     Musculoskeletal negative musculoskeletal ROS (+)    Abdominal   Peds  Hematology negative hematology ROS (+)   Anesthesia Other Findings   Reproductive/Obstetrics                              Anesthesia Physical Anesthesia Plan  ASA:  3  Anesthesia Plan: General   Post-op Pain Management:    Induction:   PONV Risk Score and Plan: 2 and Ondansetron  and Dexamethasone   Airway Management Planned: Oral ETT  Additional Equipment: None  Intra-op Plan:   Post-operative Plan: Extubation in OR  Informed Consent: I have reviewed the patients History and Physical, chart, labs and discussed the procedure including the risks, benefits and alternatives for the proposed anesthesia with the patient or authorized representative who has indicated his/her understanding and acceptance.     Dental advisory given  Plan Discussed with: CRNA  Anesthesia Plan Comments: (PAT note by Lynwood Hope, PA-C: 73 year old male with pertinent history including former smoker (50 pack years, quit 2021), polysubstance abuse, multiple CVA with residual right hemiparesis maintained on Plavix  and ASA.  Screening lung cancer CT in 06/2023 showed multiple suspicious nodules. Follow-up PET/CT 09/2023 showed uptake in lower neck and some uptake in right oropharynx. Lost to follow up briefly. CT neck on 01/01/24 showed asymmetric soft tissue in oropharynx correlating to PET.  Subsequently seen by otolaryngologist Dr. Greggory who recommended right tonsillectomy.  Patient seen by PCP Dr. Toma 02/24/2024 for preop evaluation.  Per note, EKG - Normal EKG, normal sinus rhythm, unchanged from prior EKG in Dec 2022 except V2 now with Q waves, notable artifact throughout anterior leads. The above laboratory work was ordered and will be sent with this physical. Pending the above workup, the patient is deemed low cardiac risk for the proposed procedure.  He was also advised to hold  Plavix  for 7 days prior to surgery and ASA 81 mg for 24 hours prior to surgery.  BMP and CBC 02/24/2024 reviewed, creatinine mildly elevated at 1.44, otherwise unremarkable.  Patient will need day of surgery evaluation.  EKG 02/24/2024: Normal sinus rhythm.  Rate 83. Left axis  deviation. Septal infarct , age undetermined  CT soft tissue neck 01/01/2024: IMPRESSION: 1. The hypermetabolic lesion noted on the CT PET study correlates with a 1 cm soft tissue nodule in the superior mediastinum separate from the thyroid  gland, most likely a lymph node 2. There is asymmetric soft tissue in the right oropharynx. This area showed slight asymmetric metabolism on the previous CT PET study. It may represent an oropharyngeal carcinoma. Further investigation needed.  PET scan 09/09/2023: IMPRESSION: Ground-glass nodules again seen. There is only minimal uptake along these foci. Again there is a differential. Could consider follow-up CT imaging in 3 months to assess for occult growth. If there is more clinical concern a more aggressive approach could be considered.   Significant abnormal uptake within a small soft tissue nodule in the upper mediastinum just caudal to the right thyroid  lobe near the midline. This could be an abnormal mediastinal lymph node versus a exophytic thyroid  lesion. Recommend further workup of this lesion. Dedicated neck CT scan with contrast may be useful versus MRI to further characterize.  TTE 03/16/2021: 1. Left ventricular ejection fraction, by estimation, is 50 to 55%. The  left ventricle has low normal function. The left ventricle has no regional  wall motion abnormalities. There is mild left ventricular hypertrophy.  Left ventricular diastolic  parameters are indeterminate.   2. Right ventricular systolic function is normal. The right ventricular  size is normal. There is normal pulmonary artery systolic pressure. The  estimated right ventricular systolic pressure is 27.9 mmHg.   3. The mitral valve is abnormal. Trivial mitral valve regurgitation.   4. The aortic valve is tricuspid. Aortic valve regurgitation is trivial.  Aortic valve sclerosis/calcification is present, without any evidence of  aortic stenosis.   5. The inferior vena  cava is normal in size with <50% respiratory  variability, suggesting right atrial pressure of 8 mmHg.    )         Anesthesia Quick Evaluation

## 2024-02-29 NOTE — Progress Notes (Signed)
 Anesthesia Chart Review: Same day workup  73 year old male with pertinent history including former smoker (50 pack years, quit 2021), polysubstance abuse, multiple CVA with residual right hemiparesis maintained on Plavix  and ASA.  Screening lung cancer CT in 06/2023 showed multiple suspicious nodules. Follow-up PET/CT 09/2023 showed uptake in lower neck and some uptake in right oropharynx. Lost to follow up briefly. CT neck on 01/01/24 showed asymmetric soft tissue in oropharynx correlating to PET.  Subsequently seen by otolaryngologist Dr. Greggory who recommended right tonsillectomy.  Patient seen by PCP Dr. Toma 02/24/2024 for preop evaluation.  Per note, EKG - Normal EKG, normal sinus rhythm, unchanged from prior EKG in Dec 2022 except V2 now with Q waves, notable artifact throughout anterior leads. The above laboratory work was ordered and will be sent with this physical. Pending the above workup, the patient is deemed low cardiac risk for the proposed procedure.  He was also advised to hold Plavix  for 7 days prior to surgery and ASA 81 mg for 24 hours prior to surgery.  BMP and CBC 02/24/2024 reviewed, creatinine mildly elevated at 1.44, otherwise unremarkable.  Patient will need day of surgery evaluation.  EKG 02/24/2024: Normal sinus rhythm.  Rate 83. Left axis deviation. Septal infarct , age undetermined  CT soft tissue neck 01/01/2024: IMPRESSION: 1. The hypermetabolic lesion noted on the CT PET study correlates with a 1 cm soft tissue nodule in the superior mediastinum separate from the thyroid  gland, most likely a lymph node 2. There is asymmetric soft tissue in the right oropharynx. This area showed slight asymmetric metabolism on the previous CT PET study. It may represent an oropharyngeal carcinoma. Further investigation needed.  PET scan 09/09/2023: IMPRESSION: Ground-glass nodules again seen. There is only minimal uptake along these foci. Again there is a differential.  Could consider follow-up CT imaging in 3 months to assess for occult growth. If there is more clinical concern a more aggressive approach could be considered.   Significant abnormal uptake within a small soft tissue nodule in the upper mediastinum just caudal to the right thyroid  lobe near the midline. This could be an abnormal mediastinal lymph node versus a exophytic thyroid  lesion. Recommend further workup of this lesion. Dedicated neck CT scan with contrast may be useful versus MRI to further characterize.  TTE 03/16/2021: 1. Left ventricular ejection fraction, by estimation, is 50 to 55%. The  left ventricle has low normal function. The left ventricle has no regional  wall motion abnormalities. There is mild left ventricular hypertrophy.  Left ventricular diastolic  parameters are indeterminate.   2. Right ventricular systolic function is normal. The right ventricular  size is normal. There is normal pulmonary artery systolic pressure. The  estimated right ventricular systolic pressure is 27.9 mmHg.   3. The mitral valve is abnormal. Trivial mitral valve regurgitation.   4. The aortic valve is tricuspid. Aortic valve regurgitation is trivial.  Aortic valve sclerosis/calcification is present, without any evidence of  aortic stenosis.   5. The inferior vena cava is normal in size with <50% respiratory  variability, suggesting right atrial pressure of 8 mmHg.       Lynwood Geofm RIGGERS [facility]/Anesthesiology Phone [phone] 02/29/2024 4:27 PM

## 2024-03-01 ENCOUNTER — Encounter (HOSPITAL_COMMUNITY): Admission: RE | Disposition: A | Payer: Self-pay | Source: Home / Self Care

## 2024-03-01 ENCOUNTER — Other Ambulatory Visit: Payer: Self-pay

## 2024-03-01 ENCOUNTER — Observation Stay (HOSPITAL_BASED_OUTPATIENT_CLINIC_OR_DEPARTMENT_OTHER)
Admission: RE | Admit: 2024-03-01 | Discharge: 2024-03-02 | Disposition: A | Discharge status: Home / Self Care | Source: Home / Self Care

## 2024-03-01 ENCOUNTER — Ambulatory Visit (HOSPITAL_COMMUNITY): Payer: Self-pay | Admitting: Physician Assistant

## 2024-03-01 ENCOUNTER — Encounter (HOSPITAL_COMMUNITY): Payer: Self-pay

## 2024-03-01 ENCOUNTER — Ambulatory Visit (HOSPITAL_BASED_OUTPATIENT_CLINIC_OR_DEPARTMENT_OTHER): Payer: Self-pay | Admitting: Physician Assistant

## 2024-03-01 DIAGNOSIS — D104 Benign neoplasm of tonsil: Principal | ICD-10-CM | POA: Insufficient documentation

## 2024-03-01 DIAGNOSIS — Z87891 Personal history of nicotine dependence: Secondary | ICD-10-CM | POA: Insufficient documentation

## 2024-03-01 DIAGNOSIS — Z7902 Long term (current) use of antithrombotics/antiplatelets: Secondary | ICD-10-CM | POA: Insufficient documentation

## 2024-03-01 DIAGNOSIS — Z8673 Personal history of transient ischemic attack (TIA), and cerebral infarction without residual deficits: Secondary | ICD-10-CM | POA: Insufficient documentation

## 2024-03-01 DIAGNOSIS — Z79899 Other long term (current) drug therapy: Secondary | ICD-10-CM | POA: Insufficient documentation

## 2024-03-01 DIAGNOSIS — J359 Chronic disease of tonsils and adenoids, unspecified: Secondary | ICD-10-CM | POA: Diagnosis not present

## 2024-03-01 DIAGNOSIS — Z8616 Personal history of COVID-19: Secondary | ICD-10-CM | POA: Insufficient documentation

## 2024-03-01 DIAGNOSIS — I10 Essential (primary) hypertension: Secondary | ICD-10-CM | POA: Insufficient documentation

## 2024-03-01 DIAGNOSIS — Z7982 Long term (current) use of aspirin: Secondary | ICD-10-CM | POA: Insufficient documentation

## 2024-03-01 DIAGNOSIS — I6381 Other cerebral infarction due to occlusion or stenosis of small artery: Secondary | ICD-10-CM | POA: Diagnosis not present

## 2024-03-01 DIAGNOSIS — R471 Dysarthria and anarthria: Secondary | ICD-10-CM | POA: Diagnosis not present

## 2024-03-01 DIAGNOSIS — J358 Other chronic diseases of tonsils and adenoids: Principal | ICD-10-CM | POA: Diagnosis present

## 2024-03-01 LAB — CBC
HCT: 43.1 % (ref 39.0–52.0)
Hemoglobin: 13.5 g/dL (ref 13.0–17.0)
MCH: 28 pg (ref 26.0–34.0)
MCHC: 31.3 g/dL (ref 30.0–36.0)
MCV: 89.4 fL (ref 80.0–100.0)
Platelets: 196 K/uL (ref 150–400)
RBC: 4.82 MIL/uL (ref 4.22–5.81)
RDW: 12.8 % (ref 11.5–15.5)
WBC: 5.7 K/uL (ref 4.0–10.5)
nRBC: 0 % (ref 0.0–0.2)

## 2024-03-01 LAB — CREATININE, SERUM
Creatinine, Ser: 1.48 mg/dL — ABNORMAL HIGH (ref 0.61–1.24)
GFR, Estimated: 50 mL/min — ABNORMAL LOW (ref >60)

## 2024-03-01 SURGERY — TONSILLECTOMY
Anesthesia: General | Laterality: Right

## 2024-03-01 MED ORDER — DEXAMETHASONE SOD PHOSPHATE PF 10 MG/ML IJ SOLN
INTRAMUSCULAR | Status: DC | PRN
  Administered 2024-03-01: 10 mg via INTRAVENOUS

## 2024-03-01 MED ORDER — FENTANYL CITRATE (PF) 100 MCG/2ML IJ SOLN
INTRAMUSCULAR | Status: AC
  Filled 2024-03-01: qty 2

## 2024-03-01 MED ORDER — FENTANYL CITRATE (PF) 100 MCG/2ML IJ SOLN
25.0000 ug | INTRAMUSCULAR | Status: DC | PRN
  Administered 2024-03-01: 50 ug via INTRAVENOUS

## 2024-03-01 MED ORDER — LACTATED RINGERS IV SOLN: INTRAVENOUS | Status: DC

## 2024-03-01 MED ORDER — 0.9 % SODIUM CHLORIDE (POUR BTL) OPTIME
TOPICAL | Status: DC | PRN
  Administered 2024-03-01: 1000 mL

## 2024-03-01 MED ORDER — SENNOSIDES-DOCUSATE SODIUM 8.6-50 MG PO TABS
1.0000 | ORAL_TABLET | Freq: Every evening | ORAL | Status: DC | PRN
Start: 2024-03-01 — End: 2024-03-02

## 2024-03-01 MED ORDER — ENOXAPARIN SODIUM 40 MG/0.4ML IJ SOSY
40.0000 mg | PREFILLED_SYRINGE | INTRAMUSCULAR | Status: DC
  Administered 2024-03-02: 40 mg via SUBCUTANEOUS
  Filled 2024-03-01: qty 0.4

## 2024-03-01 MED ORDER — ACETAMINOPHEN 500 MG PO TABS
1000.0000 mg | ORAL_TABLET | Freq: Four times a day (QID) | ORAL | Status: DC
  Administered 2024-03-01 – 2024-03-02 (×3): 1000 mg via ORAL
  Filled 2024-03-01 (×4): qty 2

## 2024-03-01 MED ORDER — CHLORHEXIDINE GLUCONATE 0.12 % MT SOLN: 15.0000 mL | Freq: Once | OROMUCOSAL | Status: AC

## 2024-03-01 MED ORDER — FENTANYL CITRATE (PF) 250 MCG/5ML IJ SOLN
INTRAMUSCULAR | Status: AC
  Filled 2024-03-01: qty 5

## 2024-03-01 MED ORDER — ONDANSETRON HCL 4 MG/2ML IJ SOLN
INTRAMUSCULAR | Status: DC | PRN
  Administered 2024-03-01: 4 mg via INTRAVENOUS

## 2024-03-01 MED ORDER — MIRABEGRON ER 25 MG PO TB24: 25.0000 mg | ORAL_TABLET | Freq: Every day | ORAL | 2 refills | Status: DC

## 2024-03-01 MED ORDER — SUGAMMADEX SODIUM 200 MG/2ML IV SOLN
INTRAVENOUS | Status: DC | PRN
  Administered 2024-03-01: 179.6 mg via INTRAVENOUS

## 2024-03-01 MED ORDER — CHLORHEXIDINE GLUCONATE 0.12 % MT SOLN
OROMUCOSAL | Status: AC
  Administered 2024-03-01: 15 mL via OROMUCOSAL
  Filled 2024-03-01: qty 15

## 2024-03-01 MED ORDER — PROPOFOL 10 MG/ML IV BOLUS
INTRAVENOUS | Status: DC | PRN
  Administered 2024-03-01: 110 mg via INTRAVENOUS

## 2024-03-01 MED ORDER — ORAL CARE MOUTH RINSE: 15.0000 mL | Freq: Once | OROMUCOSAL | Status: AC

## 2024-03-01 MED ORDER — PROPOFOL 10 MG/ML IV BOLUS
INTRAVENOUS | Status: AC
  Filled 2024-03-01: qty 20

## 2024-03-01 MED ORDER — ACETAMINOPHEN 10 MG/ML IV SOLN
INTRAVENOUS | Status: AC
  Filled 2024-03-01: qty 100

## 2024-03-01 MED ORDER — LIDOCAINE 2% (20 MG/ML) 5 ML SYRINGE
INTRAMUSCULAR | Status: DC | PRN
  Administered 2024-03-01: 60 mg via INTRAVENOUS

## 2024-03-01 MED ORDER — ACETAMINOPHEN 10 MG/ML IV SOLN
1000.0000 mg | Freq: Once | INTRAVENOUS | Status: DC | PRN
  Administered 2024-03-01: 1000 mg via INTRAVENOUS

## 2024-03-01 MED ORDER — OXYCODONE HCL 5 MG PO TABS: 5.0000 mg | ORAL_TABLET | Freq: Once | ORAL | Status: DC | PRN

## 2024-03-01 MED ORDER — FENTANYL CITRATE (PF) 250 MCG/5ML IJ SOLN
INTRAMUSCULAR | Status: DC | PRN
  Administered 2024-03-01: 25 ug via INTRAVENOUS
  Administered 2024-03-01: 75 ug via INTRAVENOUS

## 2024-03-01 MED ORDER — BACITRACIN ZINC 500 UNIT/GM EX OINT
1.0000 | TOPICAL_OINTMENT | Freq: Three times a day (TID) | CUTANEOUS | Status: DC
  Filled 2024-03-01: qty 28.4

## 2024-03-01 MED ORDER — OXYCODONE HCL 5 MG PO TABS
5.0000 mg | ORAL_TABLET | ORAL | Status: DC | PRN
  Administered 2024-03-01: 5 mg via ORAL
  Filled 2024-03-01: qty 1

## 2024-03-01 MED ORDER — ROCURONIUM BROMIDE 10 MG/ML (PF) SYRINGE
PREFILLED_SYRINGE | INTRAVENOUS | Status: DC | PRN
  Administered 2024-03-01: 50 mg via INTRAVENOUS

## 2024-03-01 MED ORDER — OXYCODONE HCL 5 MG/5ML PO SOLN: 5.0000 mg | Freq: Once | ORAL | Status: DC | PRN

## 2024-03-01 SURGICAL SUPPLY — 24 items
BAG COUNTER SPONGE SURGICOUNT (BAG) ×1 IMPLANT
CANISTER SUCTION 3000ML PPV (SUCTIONS) ×1 IMPLANT
CATH ROBINSON RED A/P 10FR (CATHETERS) IMPLANT
CLEANER TIP ELECTROSURG 2X2 (MISCELLANEOUS) ×1 IMPLANT
COAGULATOR SUCT SWTCH 10FR 6 (ELECTROSURGICAL) ×1 IMPLANT
ELECT COATED BLADE 2.86 ST (ELECTRODE) ×1 IMPLANT
ELECTRODE REM PT RETRN 9FT PED (ELECTROSURGICAL) IMPLANT
ELECTRODE REM PT RTRN 9FT ADLT (ELECTROSURGICAL) IMPLANT
GAUZE 4X4 16PLY ~~LOC~~+RFID DBL (SPONGE) ×1 IMPLANT
GLOVE BIO SURGEON STRL SZ 6 (GLOVE) ×1 IMPLANT
GOWN STRL REUS W/ TWL LRG LVL3 (GOWN DISPOSABLE) ×2 IMPLANT
KIT BASIN OR (CUSTOM PROCEDURE TRAY) ×1 IMPLANT
KIT TURNOVER KIT B (KITS) ×1 IMPLANT
PACK SRG BSC III STRL LF ECLPS (CUSTOM PROCEDURE TRAY) ×1 IMPLANT
PAD ARMBOARD POSITIONER FOAM (MISCELLANEOUS) IMPLANT
PENCIL SMOKE EVACUATOR (MISCELLANEOUS) ×1 IMPLANT
POSITIONER HEAD DONUT 9IN (MISCELLANEOUS) ×1 IMPLANT
SOLN 0.9% NACL POUR BTL 1000ML (IV SOLUTION) ×1 IMPLANT
SPONGE TONSIL 1.25 RF SGL STRG (GAUZE/BANDAGES/DRESSINGS) ×1 IMPLANT
SYR BULB EAR ULCER 3OZ GRN STR (SYRINGE) ×1 IMPLANT
TOWEL GREEN STERILE FF (TOWEL DISPOSABLE) ×1 IMPLANT
TUBE CONNECTING 12X1/4 (SUCTIONS) ×1 IMPLANT
TUBE SALEM SUMP 16F (TUBING) ×1 IMPLANT
YANKAUER SUCT BULB TIP NO VENT (SUCTIONS) ×1 IMPLANT

## 2024-03-01 NOTE — Anesthesia Procedure Notes (Signed)
 Procedure Name: Intubation Date/Time: 03/01/2024 1:17 PM  Performed by: Zelphia Norleen HERO, CRNAPre-anesthesia Checklist: Patient identified, Emergency Drugs available, Suction available and Patient being monitored Patient Re-evaluated:Patient Re-evaluated prior to induction Oxygen Delivery Method: Circle system utilized Preoxygenation: Pre-oxygenation with 100% oxygen Induction Type: IV induction Ventilation: Mask ventilation without difficulty Laryngoscope Size: Mac and 4 Grade View: Grade I Tube type: Oral Tube size: 7.0 mm Number of attempts: 1 Airway Equipment and Method: Stylet and Oral airway Placement Confirmation: ETT inserted through vocal cords under direct vision, positive ETCO2 and breath sounds checked- equal and bilateral Secured at: 23 cm Tube secured with: Tape Dental Injury: Teeth and Oropharynx as per pre-operative assessment

## 2024-03-01 NOTE — Op Note (Signed)
 Otolaryngology Operative note  Andre [name] Date/Time of Admission: 03/01/2024 11:13 AM  CSN: [id number];FMW:[id number]  DOB: 04-04-1951 Age: 73 y.o. Location: [facility] OR    Pre-Op Diagnosis: Lesion of tonsil  Post-Op Diagnosis: Lesion of tonsil  Procedure: Procedure(s): Right tonsillectomy - CPT 57173 Direct Laryngoscopy  Surgeon: Hadassah Parody, MD  Anesthesia type:  General  Anesthesiologist: Anesthesiologist: Leopoldo Bruckner, MD CRNA: Zelphia Norleen HERO, CRNA   Staff: Circulator: Tomas Krabbe, RN; Oral Vannie NOVAK, RN Scrub Person: Gladis Figures  Implants: * No implants in log *  Specimens: ID Type Source Tests Collected by Time Destination  1 : Right Tonsil Tissue PATH Soft tissue SURGICAL PATHOLOGY Alexy Heldt, [name], MD 03/01/2024 1333     EBL: 5 cc  Drains: none  Post-op disposition and condition: PACU, hemodynamically stable   Findings: - Direct laryngoscopy revealed small crypt / possible ulcerative area of superior pole of right tonsil. Tonsil was otherwise small and not inflamed.  - No other findings among oral cavity, pharynx, hypopharynx or larynx worrisome for malignancy   Complications: None apparent  Indications and consent:  Andre [name] is a 73 y.o. male with diagnoses above. The patient's options were discussed, including risks/benefits/alternatives for each option. Patient expressed understanding, and despite these risks, consented and decided to proceed with above procedures. Informed consent was signed before proceeding.  Procedure: The patient was identified in the preoperative area, consent confirmed, transported to the operating suite. They were transferred to the operating room table and placed in a supine position. After induction of general endotracheal anesthesia and establishment of airway, a surgeon initiated time out was performed.  The bed was rotated 90 degrees. The patient was then padded and draped appropriately.      The oral cavity was first examined and no abnormalities were identified. We then proceeded with our direct laryngoscopy. The gingiva was protected. The laryngoscope was inserted into the oral cavity and advanced distally. Examination of the oral cavity, oropharynx, hypopharynx and larynx was performed in a sequential manner at that time. It revealed small ulcerative area / possible deep crypt of right superior tonsil.   A crowe-davis mouth gag was then placed and used to hold the mouth in suspension and secured to the mayo stand. The right tonsil was grasped using a curved Allis. The tonsil was then removed using Bovie electrocautery. Hemostasis of the tonsillar fossa was obtained using suction bovie. The operative field was irrigated. The Crowe-davis was released and a flexible suction was advance to suction out any stomach contents. The mouth was re-suspended and the wound bed was hemostatic. The Crowe-davis was then removed. The patient tolerated well.   This concluded our procedure. The patient tolerated the procedure well without any apparent immediate post operative complications.  The patient was rotated back to their original position, gently awakened from general anesthesia and taken to the PACU in stable condition. I was present and participated through the entirety of the procedure.   Plan: admit overnight obs, home in AM

## 2024-03-01 NOTE — Plan of Care (Signed)
   Problem: Education: Goal: Knowledge of General Education information will improve Description: Including pain rating scale, medication(s)/side effects and non-pharmacologic comfort measures Outcome: Progressing   Problem: Health Behavior/Discharge Planning: Goal: Ability to manage health-related needs will improve Outcome: Progressing   Problem: Clinical Measurements: Goal: Ability to maintain clinical measurements within normal limits will improve Outcome: Progressing Goal: Will remain free from infection Outcome: Progressing Goal: Diagnostic test results will improve Outcome: Progressing Goal: Respiratory complications will improve Outcome: Progressing Goal: Cardiovascular complication will be avoided Outcome: Progressing   Problem: Activity: Goal: Risk for activity intolerance will decrease Outcome: Progressing   Problem: Nutrition: Goal: Adequate nutrition will be maintained Outcome: Progressing   Problem: Coping: Goal: Level of anxiety will decrease Outcome: Progressing   Problem: Elimination: Goal: Will not experience complications related to bowel motility Outcome: Progressing Goal: Will not experience complications related to urinary retention Outcome: Progressing   Problem: Pain Managment: Goal: General experience of comfort will improve and/or be controlled Outcome: Progressing   Problem: Safety: Goal: Ability to remain free from injury will improve Outcome: Progressing   Problem: Skin Integrity: Goal: Risk for impaired skin integrity will decrease Outcome: Progressing   Problem: Education: Goal: Knowledge of the prescribed therapeutic regimen will improve Outcome: Progressing   Problem: Activity: Goal: Ability to tolerate increased activity will improve Outcome: Progressing   Problem: Health Behavior/Discharge Planning: Goal: Identification of resources available to assist in meeting health care needs will improve Outcome: Progressing    Problem: Nutrition: Goal: Maintenance of adequate nutrition will improve Outcome: Progressing   Problem: Clinical Measurements: Goal: Complications related to the disease process, condition or treatment will be avoided or minimized Outcome: Progressing   Problem: Respiratory: Goal: Will regain and/or maintain adequate ventilation Outcome: Progressing   Problem: Skin Integrity: Goal: Demonstration of wound healing without infection will improve Outcome: Progressing
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
## 2024-03-01 NOTE — Progress Notes (Signed)
 Transition of Care [facility]) - Inpatient Brief Assessment   Patient Details  Name: CONROY GORACKE MRN: [id number] Date of Birth: 1951-05-05  [facility]) CM/SW Contact:    Rosaline JONELLE Joe, RN Phone Number: 03/01/2024, 3:15 PM   Clinical Narrative: Patient admitted to the hospital from home with planned Right tonsillectomy in the OR today per notes.  No IP Care management needs at this time.   Transition of Care Asessment: Insurance and Status: (P) Insurance coverage has been reviewed Patient has primary care physician: (P) Yes Home environment has been reviewed: (P) from home Prior level of function:: (P) self Prior/Current Home Services: (P) No current home services Social Drivers of Health Review: (P) SDOH reviewed no interventions necessary Readmission risk has been reviewed: (P) Yes Transition of care needs: (P) no transition of care needs at this time

## 2024-03-01 NOTE — Telephone Encounter (Signed)
 Patient came into office today regarding rx refills.  He was requesting refill on atorvastatin , folic acid  and mirabegron .   Per chart review, prescriptions for atorvastatin  and folic acid  were sent to pharmacy. Patient reported that they did not have prescriptions. Called pharmacy. These prescriptions are available for pick up.   However, mirabegron  will need refill.   Attempted to call patient to provide update. He did not answer, LVM requesting that he return call to office.   Chiquita JAYSON English, RN

## 2024-03-01 NOTE — Transfer of Care (Signed)
 Immediate Anesthesia Transfer of Care Note  Patient: Andre [name]  Procedure(s) Performed: TONSILLECTOMY (Right)  Patient Location: PACU  Anesthesia Type:General  Level of Consciousness: awake  Airway & Oxygen Therapy: Patient Spontanous Breathing and Patient connected to face mask oxygen  Post-op Assessment: Report given to RN and Post -op Vital signs reviewed and stable  Post vital signs: Reviewed and stable  Last Vitals:  Vitals Value Taken Time  BP 126/95 03/01/24 14:00  Temp 36.4 C 03/01/24 13:51  Pulse 80 03/01/24 14:04  Resp 26 03/01/24 14:04  SpO2 92 % 03/01/24 14:04  Vitals shown include unfiled device data.  Last Pain:  Vitals:   03/01/24 1351  TempSrc:   PainSc: Asleep         Complications: No notable events documented.

## 2024-03-02 ENCOUNTER — Encounter (HOSPITAL_COMMUNITY): Payer: Self-pay

## 2024-03-02 MED ORDER — OXYCODONE HCL 5 MG PO TABS: 5.0000 mg | ORAL_TABLET | Freq: Four times a day (QID) | ORAL | 0 refills | Status: DC | PRN

## 2024-03-02 MED ORDER — ACETAMINOPHEN 500 MG PO TABS: 1000.0000 mg | ORAL_TABLET | Freq: Four times a day (QID) | ORAL | Status: AC

## 2024-03-02 NOTE — Discharge Instructions (Addendum)
   Surgery Discharge Instructions:  Call clinic or return to ED if you: - cannot tolerate eating or drinking - have bleeding from your mouth   Wound Care/Dressings/Drain Instructions:  - none  Medications: - Resume your regular home medications except as detailed in the medication reconciliation.  - For pain, take tylenol  1000 mg every 6 hours. You will also be prescribed oxycodone  5 mg every 6 hours for any pain that is not covered by tylenol .  - Hold plavix  until 03/03/24. Can restart Aspirin  today 03/02/24.   Follow Up:  - We will set up a phone visit in 1 week to discuss pathology results   Activity/Restrictions:  - Resume your regular activities, as tolerated. Avoid heavy lifting or straining (more than 10 lbs) for 2 weeks .  Diet: - Resume your regular diet, as tolerated  Additional Instructions: - Please take an over the counter stool softener while taking narcotic pain medication - DO NOT MIX NARCOTIC PAIN MEDICATIONS OR TAKE NARCOTIC PRESCRIPTIONS AT THE SAME TIME - DO NOT DRIVE OR OPERATE HEAVY MACHINERY WHILE ON NARCOTICS  - DO NOT TAKE MORE THAN 4 GRAMS (4000mg ) OF TYLENOL  (ACETAMINOPHEN ) IN 24 HOURS

## 2024-03-02 NOTE — Plan of Care (Signed)
 Patient A&O X4, medications tolerated well. Patient left with call bell in reach, side rails up and bed in lowest position.   Problem: Education: Goal: Knowledge of General Education information will improve Description: Including pain rating scale, medication(s)/side effects and non-pharmacologic comfort measures Outcome: Progressing   Problem: Health Behavior/Discharge Planning: Goal: Ability to manage health-related needs will improve Outcome: Progressing   Problem: Clinical Measurements: Goal: Ability to maintain clinical measurements within normal limits will improve Outcome: Progressing   Problem: Activity: Goal: Risk for activity intolerance will decrease Outcome: Progressing   Problem: Coping: Goal: Level of anxiety will decrease Outcome: Progressing   Problem: Pain Managment: Goal: General experience of comfort will improve and/or be controlled Outcome: Progressing   Problem: Safety: Goal: Ability to remain free from injury will improve Outcome: Progressing

## 2024-03-02 NOTE — Discharge Summary (Signed)
 Physician Discharge Summary  Patient ID: Andre [name] MRN: [id number] DOB/AGE: Jul 09, 1950 73 y.o.  Admit date: 03/01/2024 Discharge date: 03/02/2024  Admission Diagnoses:  Discharge Diagnoses:  Principal Problem:   Tonsillar mass   Discharged Condition: stable  Hospital Course:  Andre [name] is a 73 y.o. male with a history of suspicious PET avidity in right tonsil. Discussed excisional biopsy via right tonsillectomy and he agreed. On 03/01/24 he underwent right tonsillectomy. He was admitted for observation after anesthesia given that he did not have anyone at home today stay with him overnight. No issues while at hospital. Today he is safe for discharge. Taking good PO and ambulating without difficulty.   We will plan for 1 week phone visit to discuss path.    Consults: None  Significant Diagnostic Studies: none  Treatments: analgesia: acetaminophen  and oxycodone  and surgery: right tonsillectomy  Discharge Exam: Blood pressure 132/78, pulse 72, temperature 99.2 F (37.3 C), temperature source Oral, resp. rate 20, height 5' 7 (1.702 m), weight 89.8 kg, SpO2 98%. General appearance: alert and cooperative Head: Normocephalic, without obvious abnormality, atraumatic Nose: normal appearance Throat: lips, mucosa, and tongue normal; teeth and gums normal and no bleeding  Neck: supple, symmetrical, trachea midline, thyroid  not enlarged, symmetric, no tenderness/mass/nodules, and no palpable adenopathy Resp: normal breathing effort Incision/Wound:no bleeding intraorally    Disposition: Discharge disposition: 01-Home or Self Care       Discharge Instructions     Diet - low sodium heart healthy   Complete by: As directed    Lifting restrictions   Complete by: As directed    No heavy lifting (> 10 pounds) for 2 weeks      Allergies as of 03/02/2024   No Known Allergies      Medication List     PAUSE taking these medications    clopidogrel  75 MG  tablet Wait to take this until: March 03, 2024 Commonly known as: PLAVIX  TAKE 1 TABLET(75 MG) BY MOUTH DAILY       TAKE these medications    acetaminophen  500 MG tablet Commonly known as: TYLENOL  Take 2 tablets (1,000 mg total) by mouth every 6 (six) hours for 14 days.   amLODipine  10 MG tablet Commonly known as: NORVASC  TAKE 1 TABLET(10 MG) BY MOUTH DAILY   aspirin  81 MG chewable tablet Chew 1 tablet (81 mg total) by mouth daily.   atorvastatin  80 MG tablet Commonly known as: LIPITOR  TAKE 1 TABLET(80 MG) BY MOUTH DAILY   folic acid  1 MG tablet Commonly known as: FOLVITE  Take 1 tablet (1 mg total) by mouth daily.   mirabegron  ER 25 MG Tb24 tablet Commonly known as: Myrbetriq  Take 1 tablet (25 mg total) by mouth daily at 12 noon.   multivitamin Tabs tablet Take 1 tablet by mouth daily.   oxyCODONE  5 MG immediate release tablet Commonly known as: Oxy IR/ROXICODONE  Take 1 tablet (5 mg total) by mouth every 6 (six) hours as needed for severe pain (pain score 7-10).   senna 8.6 MG tablet Commonly known as: SENOKOT Take 1 tablet by mouth 2 (two) times daily.   sildenafil 50 MG tablet Commonly known as: VIAGRA Take 50 mg by mouth daily as needed for erectile dysfunction.   tamsulosin  0.4 MG Caps capsule Commonly known as: FLOMAX  TAKE 1 CAPSULE(0.4 MG) BY MOUTH DAILY   thiamine  100 MG tablet Commonly known as: VITAMIN B1 Take 1 tablet (100 mg total) by mouth daily.  [facility] [address] [place], [place] [address] [phone]  Signed: Hadassah JAYSON Parody 03/02/2024, 8:49 AM

## 2024-03-02 NOTE — Anesthesia Postprocedure Evaluation (Signed)
 Anesthesia Post Note  Patient: Andre [name]  Procedure(s) Performed: TONSILLECTOMY (Right)     Patient location during evaluation: PACU Anesthesia Type: General Level of consciousness: patient cooperative and awake Pain management: pain level controlled Vital Signs Assessment: post-procedure vital signs reviewed and stable Respiratory status: spontaneous breathing, nonlabored ventilation and respiratory function stable Cardiovascular status: blood pressure returned to baseline and stable Postop Assessment: no apparent nausea or vomiting Anesthetic complications: no   No notable events documented.      Shaquaya Wuellner

## 2024-03-03 ENCOUNTER — Emergency Department (HOSPITAL_COMMUNITY)

## 2024-03-03 ENCOUNTER — Encounter (HOSPITAL_COMMUNITY): Payer: Self-pay | Admitting: Neurology

## 2024-03-03 ENCOUNTER — Inpatient Hospital Stay (HOSPITAL_COMMUNITY)
Admission: EM | Admit: 2024-03-03 | Discharge: 2024-03-07 | DRG: 981 | Disposition: A | Discharge status: Home Health | Attending: Neurology | Admitting: Neurology

## 2024-03-03 ENCOUNTER — Other Ambulatory Visit: Payer: Self-pay

## 2024-03-03 DIAGNOSIS — R4701 Aphasia: Secondary | ICD-10-CM | POA: Diagnosis present

## 2024-03-03 DIAGNOSIS — R471 Dysarthria and anarthria: Secondary | ICD-10-CM | POA: Diagnosis present

## 2024-03-03 DIAGNOSIS — Z9049 Acquired absence of other specified parts of digestive tract: Secondary | ICD-10-CM

## 2024-03-03 DIAGNOSIS — E785 Hyperlipidemia, unspecified: Secondary | ICD-10-CM | POA: Diagnosis present

## 2024-03-03 DIAGNOSIS — Z7902 Long term (current) use of antithrombotics/antiplatelets: Secondary | ICD-10-CM | POA: Diagnosis not present

## 2024-03-03 DIAGNOSIS — R531 Weakness: Principal | ICD-10-CM

## 2024-03-03 DIAGNOSIS — J359 Chronic disease of tonsils and adenoids, unspecified: Secondary | ICD-10-CM | POA: Diagnosis present

## 2024-03-03 DIAGNOSIS — I63213 Cerebral infarction due to unspecified occlusion or stenosis of bilateral vertebral arteries: Secondary | ICD-10-CM | POA: Diagnosis not present

## 2024-03-03 DIAGNOSIS — Z8673 Personal history of transient ischemic attack (TIA), and cerebral infarction without residual deficits: Secondary | ICD-10-CM

## 2024-03-03 DIAGNOSIS — Z6831 Body mass index (BMI) 31.0-31.9, adult: Secondary | ICD-10-CM | POA: Diagnosis not present

## 2024-03-03 DIAGNOSIS — R1310 Dysphagia, unspecified: Secondary | ICD-10-CM

## 2024-03-03 DIAGNOSIS — I110 Hypertensive heart disease with heart failure: Secondary | ICD-10-CM | POA: Diagnosis present

## 2024-03-03 DIAGNOSIS — I6381 Other cerebral infarction due to occlusion or stenosis of small artery: Principal | ICD-10-CM | POA: Diagnosis present

## 2024-03-03 DIAGNOSIS — R948 Abnormal results of function studies of other organs and systems: Secondary | ICD-10-CM

## 2024-03-03 DIAGNOSIS — F1410 Cocaine abuse, uncomplicated: Secondary | ICD-10-CM | POA: Diagnosis present

## 2024-03-03 DIAGNOSIS — I69391 Dysphagia following cerebral infarction: Secondary | ICD-10-CM | POA: Diagnosis not present

## 2024-03-03 DIAGNOSIS — Z79899 Other long term (current) drug therapy: Secondary | ICD-10-CM

## 2024-03-03 DIAGNOSIS — R29702 NIHSS score 2: Secondary | ICD-10-CM | POA: Diagnosis not present

## 2024-03-03 DIAGNOSIS — J690 Pneumonitis due to inhalation of food and vomit: Secondary | ICD-10-CM | POA: Diagnosis present

## 2024-03-03 DIAGNOSIS — I69351 Hemiplegia and hemiparesis following cerebral infarction affecting right dominant side: Secondary | ICD-10-CM | POA: Diagnosis not present

## 2024-03-03 DIAGNOSIS — Z87891 Personal history of nicotine dependence: Secondary | ICD-10-CM

## 2024-03-03 DIAGNOSIS — F1210 Cannabis abuse, uncomplicated: Secondary | ICD-10-CM | POA: Diagnosis present

## 2024-03-03 DIAGNOSIS — G8194 Hemiplegia, unspecified affecting left nondominant side: Secondary | ICD-10-CM | POA: Diagnosis present

## 2024-03-03 DIAGNOSIS — I6522 Occlusion and stenosis of left carotid artery: Secondary | ICD-10-CM | POA: Diagnosis present

## 2024-03-03 DIAGNOSIS — E669 Obesity, unspecified: Secondary | ICD-10-CM | POA: Diagnosis present

## 2024-03-03 DIAGNOSIS — F1010 Alcohol abuse, uncomplicated: Secondary | ICD-10-CM | POA: Diagnosis present

## 2024-03-03 DIAGNOSIS — Z8616 Personal history of COVID-19: Secondary | ICD-10-CM | POA: Diagnosis not present

## 2024-03-03 DIAGNOSIS — N179 Acute kidney failure, unspecified: Secondary | ICD-10-CM | POA: Diagnosis present

## 2024-03-03 DIAGNOSIS — H5347 Heteronymous bilateral field defects: Secondary | ICD-10-CM | POA: Diagnosis present

## 2024-03-03 DIAGNOSIS — Z7151 Drug abuse counseling and surveillance of drug abuser: Secondary | ICD-10-CM

## 2024-03-03 DIAGNOSIS — Z7982 Long term (current) use of aspirin: Secondary | ICD-10-CM

## 2024-03-03 DIAGNOSIS — I6389 Other cerebral infarction: Secondary | ICD-10-CM | POA: Diagnosis not present

## 2024-03-03 DIAGNOSIS — R29703 NIHSS score 3: Secondary | ICD-10-CM | POA: Diagnosis not present

## 2024-03-03 DIAGNOSIS — Z8249 Family history of ischemic heart disease and other diseases of the circulatory system: Secondary | ICD-10-CM

## 2024-03-03 DIAGNOSIS — N400 Enlarged prostate without lower urinary tract symptoms: Secondary | ICD-10-CM | POA: Diagnosis present

## 2024-03-03 DIAGNOSIS — R29727 NIHSS score 27: Secondary | ICD-10-CM | POA: Diagnosis present

## 2024-03-03 DIAGNOSIS — R29810 Facial weakness: Secondary | ICD-10-CM | POA: Diagnosis present

## 2024-03-03 DIAGNOSIS — I639 Cerebral infarction, unspecified: Secondary | ICD-10-CM | POA: Diagnosis not present

## 2024-03-03 DIAGNOSIS — I10 Essential (primary) hypertension: Secondary | ICD-10-CM | POA: Diagnosis present

## 2024-03-03 DIAGNOSIS — R200 Anesthesia of skin: Secondary | ICD-10-CM

## 2024-03-03 DIAGNOSIS — I7090 Unspecified atherosclerosis: Secondary | ICD-10-CM | POA: Diagnosis not present

## 2024-03-03 DIAGNOSIS — Z95828 Presence of other vascular implants and grafts: Secondary | ICD-10-CM | POA: Diagnosis not present

## 2024-03-03 DIAGNOSIS — R233 Spontaneous ecchymoses: Secondary | ICD-10-CM | POA: Diagnosis not present

## 2024-03-03 LAB — DIFFERENTIAL
Abs Immature Granulocytes: 0.03 K/uL (ref 0.00–0.07)
Basophils Absolute: 0 K/uL (ref 0.0–0.1)
Basophils Relative: 0 %
Eosinophils Absolute: 0 K/uL (ref 0.0–0.5)
Eosinophils Relative: 0 %
Immature Granulocytes: 0 %
Lymphocytes Relative: 9 %
Lymphs Abs: 1 K/uL (ref 0.7–4.0)
Monocytes Absolute: 0.8 K/uL (ref 0.1–1.0)
Monocytes Relative: 7 %
Neutro Abs: 9.1 K/uL — ABNORMAL HIGH (ref 1.7–7.7)
Neutrophils Relative %: 84 %

## 2024-03-03 LAB — CBC
HCT: 43.9 % (ref 39.0–52.0)
Hemoglobin: 13.8 g/dL (ref 13.0–17.0)
MCH: 28.3 pg (ref 26.0–34.0)
MCHC: 31.4 g/dL (ref 30.0–36.0)
MCV: 90.1 fL (ref 80.0–100.0)
Platelets: 195 K/uL (ref 150–400)
RBC: 4.87 MIL/uL (ref 4.22–5.81)
RDW: 12.8 % (ref 11.5–15.5)
WBC: 10.9 K/uL — ABNORMAL HIGH (ref 4.0–10.5)
nRBC: 0 % (ref 0.0–0.2)

## 2024-03-03 LAB — I-STAT CHEM 8, ED
BUN: 21 mg/dL (ref 8–23)
Calcium, Ion: 1.18 mmol/L (ref 1.15–1.40)
Chloride: 103 mmol/L (ref 98–111)
Creatinine, Ser: 1.4 mg/dL — ABNORMAL HIGH (ref 0.61–1.24)
Glucose, Bld: 139 mg/dL — ABNORMAL HIGH (ref 70–99)
HCT: 45 % (ref 39.0–52.0)
Hemoglobin: 15.3 g/dL (ref 13.0–17.0)
Potassium: 3.9 mmol/L (ref 3.5–5.1)
Sodium: 141 mmol/L (ref 135–145)
TCO2: 27 mmol/L (ref 22–32)

## 2024-03-03 LAB — COMPREHENSIVE METABOLIC PANEL WITH GFR
ALT: 20 U/L (ref 0–44)
AST: 29 U/L (ref 15–41)
Albumin: 3.6 g/dL (ref 3.5–5.0)
Alkaline Phosphatase: 53 U/L (ref 38–126)
Anion gap: 10 (ref 5–15)
BUN: 15 mg/dL (ref 8–23)
CO2: 24 mmol/L (ref 22–32)
Calcium: 8.8 mg/dL — ABNORMAL LOW (ref 8.9–10.3)
Chloride: 105 mmol/L (ref 98–111)
Creatinine, Ser: 1.52 mg/dL — ABNORMAL HIGH (ref 0.61–1.24)
GFR, Estimated: 49 mL/min — ABNORMAL LOW (ref >60)
Glucose, Bld: 141 mg/dL — ABNORMAL HIGH (ref 70–99)
Potassium: 4.1 mmol/L (ref 3.5–5.1)
Sodium: 139 mmol/L (ref 135–145)
Total Bilirubin: 0.7 mg/dL (ref 0.0–1.2)
Total Protein: 6.9 g/dL (ref 6.5–8.1)

## 2024-03-03 LAB — ETHANOL: Alcohol, Ethyl (B): <15 mg/dL (ref <15)

## 2024-03-03 LAB — MRSA NEXT GEN BY PCR, NASAL: MRSA by PCR Next Gen: NOT DETECTED

## 2024-03-03 LAB — PROTIME-INR
INR: 1 (ref 0.8–1.2)
Prothrombin Time: 13.7 s (ref 11.4–15.2)

## 2024-03-03 LAB — APTT: aPTT: 25 s (ref 24–36)

## 2024-03-03 LAB — CBG MONITORING, ED: Glucose-Capillary: 140 mg/dL — ABNORMAL HIGH (ref 70–99)

## 2024-03-03 MED ORDER — CLEVIDIPINE BUTYRATE 0.5 MG/ML IV EMUL
0.0000 mg/h | INTRAVENOUS | Status: DC
  Filled 2024-03-03: qty 100

## 2024-03-03 MED ORDER — ONDANSETRON HCL 4 MG/2ML IJ SOLN
INTRAMUSCULAR | Status: AC
  Filled 2024-03-03: qty 2

## 2024-03-03 MED ORDER — ORAL CARE MOUTH RINSE
15.0000 mL | OROMUCOSAL | Status: DC
  Administered 2024-03-03 – 2024-03-07 (×12): 15 mL via OROMUCOSAL

## 2024-03-03 MED ORDER — LABETALOL HCL 5 MG/ML IV SOLN
20.0000 mg | Freq: Once | INTRAVENOUS | Status: AC
  Administered 2024-03-03: 20 mg via INTRAVENOUS
  Filled 2024-03-03: qty 4

## 2024-03-03 MED ORDER — ACETAMINOPHEN 325 MG PO TABS: 650.0000 mg | ORAL_TABLET | ORAL | Status: DC | PRN

## 2024-03-03 MED ORDER — ACETAMINOPHEN 160 MG/5ML PO SOLN: 650.0000 mg | ORAL | Status: DC | PRN

## 2024-03-03 MED ORDER — SODIUM CHLORIDE 0.9 % IV SOLN
3.0000 g | Freq: Four times a day (QID) | INTRAVENOUS | Status: DC
  Administered 2024-03-03 – 2024-03-07 (×16): 3 g via INTRAVENOUS
  Filled 2024-03-03 (×16): qty 8

## 2024-03-03 MED ORDER — ACETAMINOPHEN 650 MG RE SUPP: 650.0000 mg | RECTAL | Status: DC | PRN

## 2024-03-03 MED ORDER — SODIUM CHLORIDE 0.9% FLUSH
3.0000 mL | Freq: Once | INTRAVENOUS | Status: AC
  Administered 2024-03-03: 3 mL via INTRAVENOUS

## 2024-03-03 MED ORDER — NALOXONE HCL 2 MG/2ML IJ SOSY
0.5000 mg | PREFILLED_SYRINGE | Freq: Once | INTRAMUSCULAR | Status: AC
  Administered 2024-03-03: 0.5 mg via INTRAVENOUS

## 2024-03-03 MED ORDER — PROCHLORPERAZINE EDISYLATE 10 MG/2ML IJ SOLN
10.0000 mg | Freq: Once | INTRAMUSCULAR | Status: AC
  Administered 2024-03-03: 10 mg via INTRAVENOUS
  Filled 2024-03-03: qty 2

## 2024-03-03 MED ORDER — SODIUM CHLORIDE 0.9 % IV SOLN: INTRAVENOUS | Status: DC

## 2024-03-03 MED ORDER — TRAMADOL HCL 50 MG PO TABS
50.0000 mg | ORAL_TABLET | Freq: Four times a day (QID) | ORAL | Status: DC | PRN
Start: 2024-03-03 — End: 2024-03-07

## 2024-03-03 MED ORDER — PANTOPRAZOLE SODIUM 40 MG IV SOLR
40.0000 mg | Freq: Every day | INTRAVENOUS | Status: DC
  Administered 2024-03-03 – 2024-03-04 (×2): 40 mg via INTRAVENOUS
  Filled 2024-03-03 (×2): qty 10

## 2024-03-03 MED ORDER — ORAL CARE MOUTH RINSE: 15.0000 mL | OROMUCOSAL | Status: DC | PRN

## 2024-03-03 MED ORDER — IOHEXOL 350 MG/ML SOLN
75.0000 mL | Freq: Once | INTRAVENOUS | Status: AC | PRN
  Administered 2024-03-03: 75 mL via INTRAVENOUS

## 2024-03-03 MED ORDER — SENNOSIDES-DOCUSATE SODIUM 8.6-50 MG PO TABS: 1.0000 | ORAL_TABLET | Freq: Every evening | ORAL | Status: DC | PRN

## 2024-03-03 MED ORDER — CHLORHEXIDINE GLUCONATE CLOTH 2 % EX PADS
6.0000 | MEDICATED_PAD | Freq: Every day | CUTANEOUS | Status: DC
  Administered 2024-03-03 – 2024-03-04 (×2): 6 via TOPICAL

## 2024-03-03 MED ORDER — ONDANSETRON HCL 4 MG/2ML IJ SOLN
4.0000 mg | Freq: Once | INTRAMUSCULAR | Status: AC
  Administered 2024-03-03: 4 mg via INTRAVENOUS

## 2024-03-03 MED ORDER — TENECTEPLASE FOR STROKE
0.2500 mg/kg | PACK | Freq: Once | INTRAVENOUS | Status: AC
  Administered 2024-03-03: 23 mg via INTRAVENOUS
  Filled 2024-03-03: qty 10

## 2024-03-03 MED ORDER — STROKE: EARLY STAGES OF RECOVERY BOOK
Freq: Once | Status: AC
  Administered 2024-03-04: 1
  Filled 2024-03-03: qty 1
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
## 2024-03-03 NOTE — ED Notes (Signed)
 PT had a episode of nausea and vomiting  and was medicated and cleaned up

## 2024-03-03 NOTE — ED Triage Notes (Signed)
 PT BIB [facility] from home with a c/o code stroke. PT was at home walking around and then sat down on the couch. PT was verbal until 1235 when he went non-verbal so LKW was @1235 . PT's baseline is right sided weakness from a previous stroke.PT just got released from hospital a day ago after having a tonsillectomy.

## 2024-03-03 NOTE — ED Notes (Signed)
 PT passed swallow screen with no difficulty.

## 2024-03-03 NOTE — Progress Notes (Signed)
 PHARMACIST CODE STROKE RESPONSE  Notified to mix TNK at 1420 by Dr. Jerri TNK preparation completed at 1423 Given at 1424  TNK dose = 23 mg IV over 5 seconds  Issues/delays encountered (if applicable):  Decision making process complicated by recent tonsillectomy procedure and technical difficulties in obtaining CT imaging.  Andre [name] 03/03/24 2:25 PM
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
## 2024-03-03 NOTE — Code Documentation (Addendum)
 Stroke Response Nurse Documentation Code Documentation  Andre [name] is a 73 y.o. male arriving to [facility]  via [facility] on 03/03/2024 with past medical hx of recent tonsillectomy (03/01/2024), HTN, pontine stroke with previous basilar stenting. On aspirin  81 mg daily and clopidogrel  75 mg daily. Code stroke was activated by EMS.   Patient from home where he was LKW at 1230 and now complaining of left sided weakness, left facial droop, and aphasia. He was sitting on his couch, at 1235 he vomited and family noted sudden onset of aphasia, dysarthria, and left sided weakness.   Stroke team at the bedside on patient arrival. Labs drawn and patient cleared for CT by Dr. Billy. Patient to CT with team. NIHSS 27, see documentation for details and code stroke times. Patient with decreased LOC, disoriented, left gaze preference , bilateral hemianopia, left facial droop, bilateral (left >right) arm weakness, bilateral (left > right) leg weakness, decreased sensation, Expressive aphasia , dysarthria, and neglect on exam.   The following imaging was completed:  CT Head and CTA. Patient is a candidate for IV Thrombolytic. Patient is not a candidate for IR due to imaging negative for LVO.   Care Plan:  NIHSS & VS: Q5min x 2 hours, q30min x 6 hours, q1h x 16 hours until 24 hour mark  BP < 180/105  Call Neuro for:  New Headache, worsening symptoms, bleeding, nausea/vomiting, or any signs of angioedema. .   Process Delays Noted: technical difficulties with CT scanner. Patient vomiting while in CT after having received IV Zofran . MD consulting ENT.   Bedside handoff with ED RN Lorenza.    Ryian Lynde L Tenia Goh  Rapid Response RN

## 2024-03-03 NOTE — ED Notes (Signed)
 Called and placed PT on monitor with CCMD

## 2024-03-03 NOTE — ED Notes (Signed)
 PT transported upstairs with 2 RN'S

## 2024-03-04 ENCOUNTER — Inpatient Hospital Stay (HOSPITAL_COMMUNITY)

## 2024-03-04 DIAGNOSIS — R233 Spontaneous ecchymoses: Secondary | ICD-10-CM

## 2024-03-04 DIAGNOSIS — R29703 NIHSS score 3: Secondary | ICD-10-CM

## 2024-03-04 DIAGNOSIS — I7090 Unspecified atherosclerosis: Secondary | ICD-10-CM | POA: Diagnosis not present

## 2024-03-04 DIAGNOSIS — E785 Hyperlipidemia, unspecified: Secondary | ICD-10-CM

## 2024-03-04 DIAGNOSIS — I6389 Other cerebral infarction: Secondary | ICD-10-CM

## 2024-03-04 DIAGNOSIS — I639 Cerebral infarction, unspecified: Secondary | ICD-10-CM | POA: Diagnosis not present

## 2024-03-04 DIAGNOSIS — I69391 Dysphagia following cerebral infarction: Secondary | ICD-10-CM

## 2024-03-04 LAB — CBC
HCT: 39.8 % (ref 39.0–52.0)
Hemoglobin: 12.6 g/dL — ABNORMAL LOW (ref 13.0–17.0)
MCH: 28.3 pg (ref 26.0–34.0)
MCHC: 31.7 g/dL (ref 30.0–36.0)
MCV: 89.2 fL (ref 80.0–100.0)
Platelets: 183 K/uL (ref 150–400)
RBC: 4.46 MIL/uL (ref 4.22–5.81)
RDW: 12.9 % (ref 11.5–15.5)
WBC: 8.9 K/uL (ref 4.0–10.5)
nRBC: 0 % (ref 0.0–0.2)

## 2024-03-04 LAB — COMPREHENSIVE METABOLIC PANEL WITH GFR
ALT: 16 U/L (ref 0–44)
AST: 18 U/L (ref 15–41)
Albumin: 3 g/dL — ABNORMAL LOW (ref 3.5–5.0)
Alkaline Phosphatase: 49 U/L (ref 38–126)
Anion gap: 12 (ref 5–15)
BUN: 13 mg/dL (ref 8–23)
CO2: 25 mmol/L (ref 22–32)
Calcium: 8.8 mg/dL — ABNORMAL LOW (ref 8.9–10.3)
Chloride: 105 mmol/L (ref 98–111)
Creatinine, Ser: 1.25 mg/dL — ABNORMAL HIGH (ref 0.61–1.24)
GFR, Estimated: >60 mL/min (ref >60)
Glucose, Bld: 88 mg/dL (ref 70–99)
Potassium: 3.8 mmol/L (ref 3.5–5.1)
Sodium: 142 mmol/L (ref 135–145)
Total Bilirubin: 1.1 mg/dL (ref 0.0–1.2)
Total Protein: 6.2 g/dL — ABNORMAL LOW (ref 6.5–8.1)

## 2024-03-04 LAB — ECHOCARDIOGRAM COMPLETE
AR max vel: 3.3 cm2
AV Area VTI: 3.25 cm2
AV Area mean vel: 3.14 cm2
AV Mean grad: 4 mmHg
AV Peak grad: 6.4 mmHg
Ao pk vel: 1.26 m/s
Area-P 1/2: 3.6 cm2
Calc EF: 69.2 %
Height: 67 in
S' Lateral: 3.3 cm
Single Plane A2C EF: 70.6 %
Single Plane A4C EF: 67.5 %
Weight: 3259.28 [oz_av]

## 2024-03-04 LAB — SURGICAL PATHOLOGY

## 2024-03-04 LAB — LIPID PANEL
Cholesterol: 102 mg/dL (ref 0–200)
HDL: 43 mg/dL (ref >40)
LDL Cholesterol: 44 mg/dL (ref 0–99)
Total CHOL/HDL Ratio: 2.4 ratio
Triglycerides: 76 mg/dL (ref <150)
VLDL: 15 mg/dL (ref 0–40)

## 2024-03-04 LAB — RAPID URINE DRUG SCREEN, HOSP PERFORMED
Amphetamines: NOT DETECTED
Barbiturates: NOT DETECTED
Benzodiazepines: NOT DETECTED
Cocaine: NOT DETECTED
Opiates: NOT DETECTED
Tetrahydrocannabinol: NOT DETECTED

## 2024-03-04 LAB — HEMOGLOBIN A1C
Hgb A1c MFr Bld: 4.6 % — ABNORMAL LOW (ref 4.8–5.6)
Mean Plasma Glucose: 85.32 mg/dL

## 2024-03-04 MED ORDER — PROSIGHT PO TABS
1.0000 | ORAL_TABLET | Freq: Every day | ORAL | Status: DC
  Administered 2024-03-04 – 2024-03-07 (×4): 1 via ORAL
  Filled 2024-03-04 (×4): qty 1

## 2024-03-04 MED ORDER — ATORVASTATIN CALCIUM 80 MG PO TABS
80.0000 mg | ORAL_TABLET | Freq: Every day | ORAL | Status: DC
  Administered 2024-03-04 – 2024-03-07 (×4): 80 mg via ORAL
  Filled 2024-03-04 (×4): qty 1

## 2024-03-04 MED ORDER — THIAMINE MONONITRATE 100 MG PO TABS
100.0000 mg | ORAL_TABLET | Freq: Every day | ORAL | Status: DC
Start: 2024-03-04 — End: 2024-03-07
  Administered 2024-03-04 – 2024-03-07 (×4): 100 mg via ORAL
  Filled 2024-03-04 (×6): qty 1

## 2024-03-04 MED ORDER — MIRABEGRON ER 25 MG PO TB24
25.0000 mg | ORAL_TABLET | Freq: Every day | ORAL | Status: DC
  Administered 2024-03-04 – 2024-03-07 (×4): 25 mg via ORAL
  Filled 2024-03-04 (×4): qty 1

## 2024-03-04 MED ORDER — FOLIC ACID 1 MG PO TABS
1.0000 mg | ORAL_TABLET | Freq: Every day | ORAL | Status: DC
Start: 2024-03-04 — End: 2024-03-07
  Administered 2024-03-04 – 2024-03-07 (×4): 1 mg via ORAL
  Filled 2024-03-04 (×4): qty 1

## 2024-03-04 MED ORDER — ASPIRIN 81 MG PO TBEC
81.0000 mg | DELAYED_RELEASE_TABLET | Freq: Every day | ORAL | Status: DC
  Administered 2024-03-04 – 2024-03-07 (×4): 81 mg via ORAL
  Filled 2024-03-04 (×4): qty 1

## 2024-03-04 MED ORDER — TAMSULOSIN HCL 0.4 MG PO CAPS
0.4000 mg | ORAL_CAPSULE | Freq: Every day | ORAL | Status: DC
Start: 2024-03-04 — End: 2024-03-07
  Administered 2024-03-04 – 2024-03-07 (×4): 0.4 mg via ORAL
  Filled 2024-03-04 (×4): qty 1

## 2024-03-04 MED ORDER — CLOPIDOGREL BISULFATE 75 MG PO TABS
75.0000 mg | ORAL_TABLET | Freq: Every day | ORAL | Status: DC
  Administered 2024-03-04 – 2024-03-07 (×4): 75 mg via ORAL
  Filled 2024-03-04 (×4): qty 1

## 2024-03-04 MED ORDER — PERFLUTREN LIPID MICROSPHERE
1.0000 mL | INTRAVENOUS | Status: DC | PRN
  Administered 2024-03-04: 2 mL via INTRAVENOUS

## 2024-03-04 MED ORDER — ENOXAPARIN SODIUM 40 MG/0.4ML IJ SOSY
40.0000 mg | PREFILLED_SYRINGE | INTRAMUSCULAR | Status: DC
  Administered 2024-03-04 – 2024-03-06 (×3): 40 mg via SUBCUTANEOUS
  Filled 2024-03-04 (×3): qty 0.4
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
## 2024-03-04 NOTE — Progress Notes (Addendum)
 STROKE TEAM PROGRESS NOTE    SIGNIFICANT HOSPITAL EVENTS 10/30 presented with sudden onset aphasia and left-sided weakness received IV TNK.  CTA with no LVO  INTERIM HISTORY/SUBJECTIVE  Patient admitted to the neuro ICU post TNK.  Patient will go for MBS today No family at the bedside. Neurological exam is with left facial, right lateral gaze is not complete and has dysarthria.  Creatinine is 1.25 will keep IV fluids going for now MRI brain scheduled for this afternoon for 24-hour post TNK  CBC    Component Value Date/Time   WBC 8.9 03/04/2024 0220   RBC 4.46 03/04/2024 0220   HGB 12.6 (L) 03/04/2024 0220   HGB 14.3 02/24/2024 1049   HCT 39.8 03/04/2024 0220   HCT 45.5 02/24/2024 1049   PLT 183 03/04/2024 0220   PLT 200 02/24/2024 1049   MCV 89.2 03/04/2024 0220   MCV 92 02/24/2024 1049   MCH 28.3 03/04/2024 0220   MCHC 31.7 03/04/2024 0220   RDW 12.9 03/04/2024 0220   RDW 11.8 02/24/2024 1049   LYMPHSABS 1.0 03/03/2024 1340   MONOABS 0.8 03/03/2024 1340   EOSABS 0.0 03/03/2024 1340   BASOSABS 0.0 03/03/2024 1340    BMET    Component Value Date/Time   NA 142 03/04/2024 0220   NA 143 02/24/2024 1049   K 3.8 03/04/2024 0220   CL 105 03/04/2024 0220   CO2 25 03/04/2024 0220   GLUCOSE 88 03/04/2024 0220   BUN 13 03/04/2024 0220   BUN 17 02/24/2024 1049   CREATININE 1.25 (H) 03/04/2024 0220   CALCIUM  8.8 (L) 03/04/2024 0220   EGFR 52 (L) 02/24/2024 1049   GFRNONAA >60 03/04/2024 0220    IMAGING past 24 hours   Vitals:   03/04/24 0530 03/04/24 0600 03/04/24 0700 03/04/24 0800  BP: 132/72 124/67 132/76 (!) 154/68  Pulse: 73 73 77 75  Resp: (!) 23 (!) 22 (!) 23 (!) 24  Temp:    98.4 F (36.9 C)  TempSrc:    Oral  SpO2: 92% 91% 93% (!) 89%  Weight:      Height:         PHYSICAL EXAM General:  Alert, well-nourished, well-developed patient in no acute distress Psych:  Mood and affect appropriate for situation CV: Regular rate and rhythm on  monitor Respiratory:  Regular, unlabored respirations on room air GI: Abdomen soft and nontender   NEURO:  Mental Status: AA&Ox3, patient is able to give clear and coherent history Speech/Language: speech is without aphasia.  Dysarthric speech naming, repetition, fluency, and comprehension intact.  Cranial Nerves:  II: PERRL. Visual fields full.  III, IV, VI: EOM incomplete right lateral gaze eyelids elevate symmetrically.  V: Sensation is intact to light touch and symmetrical to face.  VII: Left facial droop VIII: hearing intact to voice. IX, X: Palate elevates symmetrically. Phonation is normal.  KP:Dynloizm shrug 5/5. XII: tongue is midline without fasciculations. Motor: 5/5 strength to all muscle groups tested.  Tone: is normal and bulk is normal Sensation- Intact to light touch bilaterally. Extinction absent to light touch to DSS.   Coordination: FTN intact bilaterally, HKS: no ataxia in BLE.No drift.  Gait- deferred  Most Recent NIH  1a Level of Conscious.:  1b LOC Questions:  1c LOC Commands:  2 Best Gaze: 1 3 Visual:  4 Facial Palsy: 1 5a Motor Arm - left:  5b Motor Arm - Right:  6a Motor Leg - Left:  6b Motor Leg - Right:  7  Limb Ataxia:  8 Sensory: 1 9 Best Language:  10 Dysarthria:  11 Extinct. and Inatten.:  TOTAL: 3   ASSESSMENT/PLAN  Mr. RAZI HICKLE is a 73 y.o. male with history of  hypertension, BPH, polysubstance abuse, pontine stroke with previous basilar stenting and recent tonsillectomy presents with sudden onset aphasia and left-sided weakness.  Patient had a tonsillectomy on 10/28 and was recovering uneventfully and was in his normal state of health at 1230.  He then sat on the couch, threw up and developed sudden onset speaking difficulty, severe dysarthria, left facial droop and left-sided weakness.  CT no acute abnormality.  CTA head and neck no LVO,  NIH on Admission 27  Stroke: b/l cerebellar infarcts, R>L s/p TNK, etiology: likely  large vessel disease due to risk factors Code Stroke  CT head No acute abnormality. ASPECTS 10.    Left cerebellar encephalomalacia consistent with remote infarct.  CTA head & neck Mixed density atherosclerosis at the left ICA, resulting in 70% stenosis. Atherosclerotic calcification of the ICA siphon with circumferential calcification of the distal LICA causing severe stenosis. Multifocal atherosclerosis of both vertebral arteries' V4 segments with moderate stenosis. Patent stent within the distal basilar artery. MRI showed b/l cerebellar infarcts with petechial hemorrhage 2D Echo EF 55 to 60%.  LV with grade 1 diastolic dysfunction LDL 44 HgbA1c 4.6 UDS neg VTE prophylaxis -SCDs clopidogrel  75 mg daily was on hold due to tonsillectomy prior to admission, now on ASA and plavix  for 3 months due to VA stenosis and then plavix  alone.  Therapy recommendations:  SNF Disposition: Pending  Hx of Stroke/TIA 05/2015, MRI showed right pontine infarct, remote right BG and cerebellum infarct.  CTA head and neck showed right ICA 60% stenosis, left ICA 50% stenosis, bilateral ICA siphon, VA and BA stenosis.  EF 50 to 55%, LDL 52, UDS positive for cocaine.  Discharged with DAPT and Zocor  04/2017 admitted for right-sided weakness, slurred speech and right facial droop.  MRI showed left pontine infarct.  CT head and neck bilateral ICA bulb 30 to 40% stenosis.  Severe left siphon stenosis, basilar artery stenosis.  EF 60 to 65%.  A1c 4.6, LDL 45, UDS positive for cocaine.  Discharged with DAPT and Lipitor  80. 03/2021 admitted for bilateral pontine and right PCA scattered infarct. CTA head neck showed mid dental artery occlusion status post IR with TIIC3 and basilar artery stenting.  UDS again positive for cocaine. EF 50-55%. LDL 54 and A1C 4.6. he was discharged on aspirin  and Brilinta  due to basilar artery stenting.   Per niece, patient recovered well, using cane for walking but no significant weakness, conversing  well, home medication including Plavix  75 and Lipitor  80.  His plavix  was on hold for the tonsillectomy and plan to re-start 10/30  Hypertension Home meds: Amlodipine  10 mg Stable Avoid low BP Long term BP goal normotensive  Hyperlipidemia Home meds: Atorvastatin  80 mg,  resumed in hospital LDL 44, goal < 70 Continue statin at discharge  Concern for aspiration pneumonia Started on Unasyn  Chest x-ray Mild cardiomegaly with mild central vascular congestion.  Symptoms has improved and secretions are minimal now  Substance Abuse Patient has history of marijuana and cocaine use UDS negative this admission       Ready to quit? Yes TOC consult for cessation placed  Dysphagia Patient has post-stroke dysphagia, SLP consulted On dys 3 and thin liquid Advance diet as tolerated  Other Stroke Risk Factors  ETOH use, alcohol level <15, advised to  drink no more than 2 drink(s) a day Obesity, Body mass index is 31.9 kg/m., BMI >/= 30 associated with increased stroke risk, recommend weight loss, diet and exercise as appropriate Congestive heart failure  Other Active Problems BPH AKI-Cre 1.52->1.40->1.25 continue fluids and will monitor Recent tonsillectomy on 03/01/24  Hospital day # 1   Karna Geralds DNP, ACNPC-AG  [facility]  ATTENDING NOTE: I reviewed above note and agree with the assessment and plan. Pt was seen and examined.   RN is at the bedside. Pt much more awake alert this morning, eyes open, orientated to age, place, time. No aphasia but mild dysarthria, fluent language, following all simple commands. Able to name and repeat. No gaze palsy, tracking bilaterally, visual field full, PERRL. Mild left facial droop, probably chronic. Tongue midline. LUE 4/5 and RUE 5/5. Bilaterally LEs 5/5, no drift. Sensation symmetrical bilaterally, b/l FTN intact, gait not tested.   Pt stoke likely from large vessel disease from b/l VA stenosis. Hx of cocaine use, but today UDS  neg. LDL and A1C at goal. Restart antiplatelet with DAPT for 3 months and then plavix  alone. Continue statin. PT and OT recommend SNF. Cre improving, off IVF, encourage po intake.   For detailed assessment and plan, please refer to above as I have made changes wherever appropriate.   Ary Cummins, MD PhD Stroke Neurology 03/04/2024 6:11 PM  This patient is critically ill due to stroke status post TNK, history of stroke, vasculopathy and at significant risk of neurological worsening, death form recurrent stroke, hemorrhagic conversion, bleeding from TNK. This patient's care requires constant monitoring of vital signs, hemodynamics, respiratory and cardiac monitoring, review of multiple databases, neurological assessment, discussion with family, other specialists and medical decision making of high complexity. I spent 40 minutes of neurocritical care time in the care of this patient.    To contact Stroke Continuity provider, please refer to [email or web address]. After hours, contact General Neurology

## 2024-03-04 NOTE — Progress Notes (Signed)
 OT Cancellation Note  Patient Details Name: Andre [name] MRN: [id number] DOB: [birth year]   Cancelled Treatment:    Reason Eval/Treat Not Completed: Medical issues which prohibited therapy;Active bedrest order Patient on bedrest till around 1424 on 10/31 given TNK administration. OT will follow back when medically appropriate.   Ronal Gift E. Corin Tilly, OTR/L Acute Rehabilitation Services [phone]   Ronal Gift Salt 03/04/2024, 8:01 AM
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
## 2024-03-04 NOTE — TOC CM/SW Note (Signed)
 Transition of Care [facility]) - Inpatient Brief Assessment   Patient Details  Name: Andre [name] MRN: [id number] Date of Birth: [birth year]  Transition of Care [facility]) CM/SW Contact:    Emery Dupuy M, RN Phone Number: 03/04/2024, 4:39 PM   Clinical Narrative: 73 yo male presenting to ED 10/30 with sudden onset aphasia and L sided weakness. CTH negative and CTA with stable carotid stenosis, received TNK 1424 10/30. MRI pending.  PT/OT recommending SNF for short term rehab at discharge.  Will consult CSW.    Transition of Care Asessment: Insurance and Status: Insurance coverage has been reviewed Patient has primary care physician: Yes (Dr. Toma) Home environment has been reviewed: Lives in apartment; aide M-F, 9-12 who helps with IADLs; has friends Prior level of function:: Needs assist; uses cane Prior/Current Home Services: No current home services Social Drivers of Health Review: SDOH reviewed no interventions necessary Readmission risk has been reviewed: Yes Transition of care needs: no transition of care needs at this time  Mliss MICAEL Fass, RN, BSN  Trauma/Neuro ICU Case Manager [phone]

## 2024-03-04 NOTE — Progress Notes (Signed)
  Echocardiogram 2D Echocardiogram has been performed.  Norleen ORN [facility] 03/04/2024, 11:55 AM
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
## 2024-03-04 NOTE — Progress Notes (Signed)
 Modified Barium Swallow Study  Patient Details  Name: Andre [name] MRN: [id number] Date of Birth: 1951-02-26  Today's Date: 03/04/2024  Modified Barium Swallow completed.  Full report located under Chart Review in the Imaging Section.  History of Present Illness 73 yo male presenting to ED 10/30 with sudden onset aphasia and L sided weakness. CTH negative and CTA with stable carotid stenosis, received TNK. MRI pending. Concern for aspiration due to noisy respirations and rhonchi, CXR negative. CT Soft Tissue 8/29 shows asymmetric soft tissue in the R oropharynx, may represent oropharyngeal carcinoma s/p R tonsillectomy 10/28 for resection. History of pontine stroke 2012 and 2019 with oropharyngeal dysphagia characterized by R sided labial and lingual weakness, generally weak hyoid excursion, pharyngeal contraction, and BOT contraction resulting in moderate residue that pt clears with effortful swallows. Other PMH includes HTN, BPH, polysubstance abuse, pontine stroke with previous basial stenting (residual  LLE instability, mixed dysarthria, mild dysphagia, sensory and vision deficits)   Clinical Impression Pt presents with grossly similar oropharyngeal function compared to most recent MBS 2019. Residue collects in the valleculae and pyriform sinuses but is not as easily cleared by subsequent effortful swallows. Due to the amount of residue, penetration occurs after the swallow but he initiates a throat clear and reswallows, which is effective to expel penetrates (PAS 2). Suspect this may happen repeatedly throughout the course of a meal, though diet modifications are unlikely to yield any benefit as residuals accumulate regardless of the consistency. Recommend continuing current diet with full supervision to cue pt to swallow multiple times and clear his throat intermittently. Sit upright for ~30 minutes after meal, using multiple effortful swallows to clear the pharynx before reclining. SLP  will f/u acutely to reinforce strategies. Recommend establishing OP f/u as well for ongoing dysphagia support pending oropharyngeal mass w/u.  Factors that may increase risk of adverse event in presence of aspiration Noe & Lianne 2021): Poor general health and/or compromised immunity;Reduced cognitive function;Limited mobility;Weak cough  Swallow Evaluation Recommendations Recommendations: PO diet PO Diet Recommendation: Dysphagia 3 (Mechanical soft);Thin liquids (Level 0) Liquid Administration via: Cup;Straw Medication Administration: Whole meds with puree Supervision: Staff to assist with self-feeding;Intermittent supervision/cueing for swallowing strategies Swallowing strategies  : Minimize environmental distractions;Slow rate;Small bites/sips;Multiple dry swallows after each bite/sip;Clear throat intermittently Postural changes: Position pt fully upright for meals;Stay upright 30-60 min after meals Oral care recommendations: Oral care BID (2x/day)      Damien Blumenthal, M.A., CCC-SLP Speech Language Pathology, Acute Rehabilitation Services  Secure Chat preferred [phone]  03/04/2024,3:34 PM

## 2024-03-04 NOTE — Evaluation (Signed)
 Physical Therapy Evaluation Patient Details Name: Andre [name] MRN: [id number] DOB: Mar 28, 1951 Today's Date: 03/04/2024  History of Present Illness  73 yo male presenting to ED 10/30 with sudden onset aphasia and L sided weakness. CTH negative and CTA with stable carotid stenosis, received TNK 1424 10/30. MRI pending. PMH includes hypertension, BPH, polysubstance abuse, R pontine stroke 2017, L pontine CVA 2018, bilat pontine and R PCA CVA 2022 with previous basilar stenting and recent tonsillectomy on 10/28.  Clinical Impression   Pt presents with generalized weakness, R lateral bias especially in standing, poor activity tolerance vs baseline. Pt to benefit from acute PT to address deficits. Pt requiring min-mod +2 assist for transfer OOB, pt with heavy R lateral bias especially with fatigue. Pt with R inattention, states this is baseline and will attend when cued. Pt lives at home and is independent with ADLs and mobility at baseline. Patient will benefit from continued inpatient follow up therapy, <3 hours/day. PT to progress mobility as tolerated, and will continue to follow acutely.          If plan is discharge home, recommend the following: A lot of help with walking and/or transfers;A lot of help with bathing/dressing/bathroom   Can travel by private vehicle   Yes    Equipment Recommendations None recommended by PT  Recommendations for Other Services       Functional Status Assessment Patient has had a recent decline in their functional status and demonstrates the ability to make significant improvements in function in a reasonable and predictable amount of time.     Precautions / Restrictions Precautions Precautions: Fall Restrictions Weight Bearing Restrictions Per Provider Order: No      Mobility  Bed Mobility Overal bed mobility: Needs Assistance Bed Mobility: Supine to Sit     Supine to sit: Min assist, +2 for safety/equipment     General bed mobility  comments: assist for trunk elevation, LE progression to EOB, and scooting to EOB    Transfers Overall transfer level: Needs assistance Equipment used: 2 person hand held assist Transfers: Sit to/from Stand, Bed to chair/wheelchair/BSC Sit to Stand: Min assist, +2 safety/equipment   Step pivot transfers: Mod assist, +2 safety/equipment       General transfer comment: assist for rise, steady, and pivotal steps to recliner with increasingly heavy R lateral bias requiring intervention to correct.    Ambulation/Gait               General Gait Details: nt  Acupuncturist Bed    Modified Rankin (Stroke Patients Only) Modified Rankin (Stroke Patients Only) Pre-Morbid Rankin Score: Slight disability Modified Rankin: Moderately severe disability     Balance Overall balance assessment: Needs assistance, History of Falls Sitting-balance support: No upper extremity supported, Feet supported Sitting balance-[name]: Fair     Standing balance support: Bilateral upper extremity supported, During functional activity, Reliant on assistive device for balance Standing balance-[name]: Poor                               Pertinent Vitals/Pain Pain Assessment Pain Assessment: No/denies pain    Home Living Family/patient expects to be discharged to:: Private residence Living Arrangements: Alone Available Help at Discharge: Family;Friend(s) (live in apts around him and help as needed) Type of Home: Apartment Home Access: Level entry  Alternate Level Stairs-Number of Steps: 10 Home Layout: Two level Home Equipment: Cane - single Librarian, Academic (2 wheels);BSC/3in1;Hand held shower head      Prior Function Prior Level of Function : Needs assist             Mobility Comments: walks with cane at baseline ADLs Comments: aide M-F, 9-12 who helps with IADLs     Extremity/Trunk Assessment   Upper  Extremity Assessment Upper Extremity Assessment: Defer to OT evaluation    Lower Extremity Assessment Lower Extremity Assessment: Generalized weakness (history of CVA with L hemiparesis, MMT grossly symmetric 3+-4/5 throughout)    Cervical / Trunk Assessment Cervical / Trunk Assessment: Normal  Communication   Communication Communication: Impaired Factors Affecting Communication: Reduced clarity of speech (periods of reduced clarity)    Cognition Arousal: Alert Behavior During Therapy: WFL for tasks assessed/performed   PT - Cognitive impairments: No family/caregiver present to determine baseline, Problem solving, Safety/Judgement, Memory                       PT - Cognition Comments: question pt's accuracy as historian, initially states he needs no help with any ADLs but then states he has an aide to help with IADLs.         Cueing       General Comments General comments (skin integrity, edema, etc.): vss    Exercises     Assessment/Plan    PT Assessment Patient needs continued PT services  PT Problem List Decreased strength;Decreased mobility;Decreased balance;Decreased knowledge of use of DME;Pain;Decreased safety awareness;Decreased activity tolerance       PT Treatment Interventions DME instruction;Therapeutic activities;Gait training;Therapeutic exercise;Patient/family education;Balance training;Stair training;Functional mobility training;Neuromuscular re-education    PT Goals (Current goals can be found in the Care Plan section)  Acute Rehab PT Goals PT Goal Formulation: With patient Time For Goal Achievement: 03/18/24 Potential to Achieve Goals: Good    Frequency Min 2X/week     Co-evaluation PT/OT/SLP Co-Evaluation/Treatment: Yes Reason for Co-Treatment: For patient/therapist safety;To address functional/ADL transfers PT goals addressed during session: Mobility/safety with mobility;Balance         AM-PAC PT 6 Clicks Mobility  Outcome  Measure Help needed turning from your back to your side while in a flat bed without using bedrails?: A Little Help needed moving from lying on your back to sitting on the side of a flat bed without using bedrails?: A Little Help needed moving to and from a bed to a chair (including a wheelchair)?: A Little Help needed standing up from a chair using your arms (e.g., wheelchair or bedside chair)?: A Lot Help needed to walk in hospital room?: A Lot Help needed climbing 3-5 steps with a railing? : Total 6 Click Score: 14    End of Session Equipment Utilized During Treatment: Gait belt Activity Tolerance: Patient limited by fatigue Patient left: in chair;with chair alarm set;with call bell/phone within reach;with family/visitor present Nurse Communication: Mobility status PT Visit Diagnosis: Other abnormalities of gait and mobility (R26.89);Muscle weakness (generalized) (M62.81)    Time: 8856-8790 PT Time Calculation (min) (ACUTE ONLY): 26 min   Charges:   PT Evaluation $PT Eval Low Complexity: 1 Low   PT General Charges $$ ACUTE PT VISIT: 1 Visit         Johana RAMAN, PT DPT Acute Rehabilitation Services Secure Chat Preferred  Office [phone]   Dory Demont E Stroup 03/04/2024, 1:15 PM

## 2024-03-04 NOTE — Evaluation (Signed)
 Occupational Therapy Evaluation Patient Details Name: Andre [name] MRN: [id number] DOB: [birth year] Today's Date: 03/04/2024   History of Present Illness   73 yo male presenting to ED 10/30 with sudden onset aphasia and L sided weakness. CTH negative and CTA with stable carotid stenosis, received TNK 1424 10/30. MRI pending. PMH includes hypertension, BPH, polysubstance abuse, R pontine stroke 2017, L pontine CVA 2018, bilat pontine and R PCA CVA 2022 with previous basilar stenting and recent tonsillectomy on 10/28.     Clinical Impressions Prior to this admission, patient living alone, with family providing groceries, and with an aide that completes IADLs M-F 9-12. Patient is typically able to climb ten steps to enter, complete a tub shower transfer, and manage his medications independently. Currently, patient with decreased safety awareness, and required min A for ADLs and transfers. Patient with significant R lateral lean when completing HHA transfer (min A of 2) with no attempt to correct independently. OT recommending rehab of lesser intensity < 3 hours prior to discharge home. OT will continue to follow acutely.   VSS on RA BP 151/92 (109)     If plan is discharge home, recommend the following:   A lot of help with walking and/or transfers;A lot of help with bathing/dressing/bathroom;Direct supervision/assist for medications management;Direct supervision/assist for financial management;Assist for transportation;Supervision due to cognitive status;Help with stairs or ramp for entrance     Functional Status Assessment   Patient has had a recent decline in their functional status and demonstrates the ability to make significant improvements in function in a reasonable and predictable amount of time.     Equipment Recommendations   Other (comment) (defer to next venue)     Recommendations for Other Services         Precautions/Restrictions    Precautions Precautions: Fall Restrictions Weight Bearing Restrictions Per Provider Order: No     Mobility Bed Mobility Overal bed mobility: Needs Assistance Bed Mobility: Supine to Sit     Supine to sit: Min assist, +2 for safety/equipment     General bed mobility comments: assist for trunk elevation, LE progression to EOB, and scooting to EOB    Transfers Overall transfer level: Needs assistance Equipment used: 2 person hand held assist Transfers: Sit to/from Stand, Bed to chair/wheelchair/BSC Sit to Stand: Min assist, +2 safety/equipment     Step pivot transfers: Mod assist, +2 safety/equipment     General transfer comment: assist for rise, steady, and pivotal steps to recliner with increasingly heavy R lateral bias requiring intervention to correct.      Balance Overall balance assessment: Needs assistance, History of Falls Sitting-balance support: No upper extremity supported, Feet supported Sitting balance-[name]: Fair     Standing balance support: Bilateral upper extremity supported, During functional activity, Reliant on assistive device for balance Standing balance-[name]: Poor                             ADL either performed or assessed with clinical judgement   ADL Overall ADL's : Needs assistance/impaired Eating/Feeding: Set up;Sitting   Grooming: Set up;Sitting   Upper Body Bathing: Contact guard assist;Sitting   Lower Body Bathing: Moderate assistance;Sitting/lateral leans;Sit to/from stand   Upper Body Dressing : Set up;Sitting   Lower Body Dressing: Moderate assistance;Sit to/from stand;Sitting/lateral leans Lower Body Dressing Details (indicate cue type and reason): posterior lean noted with adjusting socks Toilet Transfer: Minimal assistance;+2 for physical assistance;BSC/3in1 Toilet Transfer Details (indicate cue  type and reason): simulated Toileting- Clothing Manipulation and Hygiene: Minimal  assistance;Sitting/lateral lean;Sit to/from stand       Functional mobility during ADLs: Minimal assistance;Cueing for safety;Cueing for sequencing;Rolling walker (2 wheels) General ADL Comments: Prior to this admission, patient living alone, with family providing groceries, and with an aide that completes IADLs M-F 9-12. Patient is typically able to climb ten steps to enter, complete a tub shower transfer, and manage his medications independently. Currently, patient with decreased safety awareness, and required min A for ADLs and transfers. Patient with significant R lateral lean when completing HHA transfer (min A of 2) with no attempt to correct independently. OT recommending rehab of lesser intensity < 3 hours prior to discharge home. OT will continue to follow acutely.     Vision Baseline Vision/History: 1 Wears glasses Ability to See in Adequate Light: 0 Adequate Patient Visual Report: No change from baseline Vision Assessment?: No apparent visual deficits Additional Comments: Reports double vision has stopped     Perception Perception: Impaired Preception Impairment Details: Body Scheme     Praxis Praxis: Impaired Praxis Impairment Details: Motor planning     Pertinent Vitals/Pain Pain Assessment Pain Assessment: No/denies pain     Extremity/Trunk Assessment Upper Extremity Assessment Upper Extremity Assessment: Right hand dominant;LUE deficits/detail LUE Deficits / Details: previous L hemiparesis, sensation intact, functional movement LUE Sensation: WNL LUE Coordination: decreased gross motor   Lower Extremity Assessment Lower Extremity Assessment: Defer to PT evaluation   Cervical / Trunk Assessment Cervical / Trunk Assessment: Normal   Communication Communication Communication: Impaired Factors Affecting Communication: Reduced clarity of speech (periods of reduced clarity)   Cognition Arousal: Alert Behavior During Therapy: WFL for tasks  assessed/performed Cognition: Cognition impaired     Awareness: Intellectual awareness intact, Online awareness impaired   Attention impairment (select first level of impairment): Focused attention Executive functioning impairment (select all impairments): Problem solving, Reasoning OT - Cognition Comments: Poor insight into deficits, prominent lean to the R, but states he can manage 10 steps independently                 Following commands: Intact       Cueing  General Comments   Cueing Techniques: Verbal cues  VSS on RA   Exercises     Shoulder Instructions      Home Living Family/patient expects to be discharged to:: Private residence Living Arrangements: Alone Available Help at Discharge: Family;Friend(s) (live in apts around him and help as needed) Type of Home: Apartment Home Access: Level entry     Home Layout: Two level Alternate Level Stairs-Number of Steps: 10   Bathroom Shower/Tub: Chief Strategy Officer: Standard     Home Equipment: Cane - single Librarian, Academic (2 wheels);BSC/3in1;Hand held shower head      Lives With: Alone    Prior Functioning/Environment Prior Level of Function : Needs assist             Mobility Comments: walks with cane at baseline ADLs Comments: aide M-F, 9-12 who helps with IADLs    OT Problem List: Decreased strength;Decreased activity tolerance;Impaired balance (sitting and/or standing);Decreased coordination;Decreased cognition;Decreased safety awareness;Decreased knowledge of use of DME or AE   OT Treatment/Interventions: Self-care/ADL training;Therapeutic exercise;Energy conservation;DME and/or AE instruction;Manual therapy;Therapeutic activities;Cognitive remediation/compensation;Patient/family education;Balance training      OT Goals(Current goals can be found in the care plan section)   Acute Rehab OT Goals Patient Stated Goal: to get back to my normal OT Goal Formulation: With  patient Time For Goal Achievement: 03/18/24 Potential to Achieve Goals: Good ADL Goals Pt Will Perform Lower Body Bathing: with set-up;sitting/lateral leans;sit to/from stand Pt Will Perform Lower Body Dressing: with set-up;sit to/from stand;sitting/lateral leans Pt Will Transfer to Toilet: with set-up;ambulating;regular height toilet Pt Will Perform Toileting - Clothing Manipulation and hygiene: with set-up;sit to/from stand;sitting/lateral leans Additional ADL Goal #1: Patient will be able follow 2-3 step commands without cues or assist in order to return to prior level of independence. Additional ADL Goal #2: Patient will be able to complete functional task in standing for 3-5 minutes prior to needing seated rest break in order to improve activity tolerance.   OT Frequency:  Min 2X/week    Co-evaluation   Reason for Co-Treatment: For patient/therapist safety;To address functional/ADL transfers PT goals addressed during session: Mobility/safety with mobility;Balance OT goals addressed during session: ADL's and self-care;Strengthening/ROM      AM-PAC OT 6 Clicks Daily Activity     Outcome Measure Help from another person eating meals?: A Little Help from another person taking care of personal grooming?: A Little Help from another person toileting, which includes using toliet, bedpan, or urinal?: A Lot Help from another person bathing (including washing, rinsing, drying)?: A Little Help from another person to put on and taking off regular upper body clothing?: A Little Help from another person to put on and taking off regular lower body clothing?: A Lot 6 Click Score: 16   End of Session Equipment Utilized During Treatment: Gait belt Nurse Communication: Mobility status  Activity Tolerance: Patient tolerated treatment well Patient left: in chair;with call bell/phone within reach;with chair alarm set  OT Visit Diagnosis: Unsteadiness on feet (R26.81);Other abnormalities of gait  and mobility (R26.89);Muscle weakness (generalized) (M62.81);History of falling (Z91.81);Other symptoms and signs involving cognitive function                Time: 8853-8792 OT Time Calculation (min): 21 min Charges:  OT General Charges $OT Visit: 1 Visit OT Evaluation $OT Eval Moderate Complexity: 1 Mod  Ronal Gift E. Earleen Aoun, OTR/L Acute Rehabilitation Services [phone]   Ronal Gift Salt 03/04/2024, 3:44 PM

## 2024-03-04 NOTE — Progress Notes (Signed)
 There was concern for aspiration PNA, therefore patient kept NPO overnight awaiting official SLP eval.

## 2024-03-04 NOTE — Plan of Care (Signed)
 ENT plan of care:   Pt presented to the ED POD2 after tonsillectomy with acute stroke symptoms. ENT was called to discuss TNK administration given recent tonsillectomy.   There was delay in TNK administration given issues that I was unaware of in that my phone number was not available on the [employer] call system phone app (this issues has since been resolved). The team was able to reach me through the [employer] website where my phone number is listed. Discussed with Dr. Jerri and agreed OK to administer TNK as soon as possible. If tonsil bleed occurs will manage the bleed at that time.   My number is [phone] should there be any issues.

## 2024-03-05 DIAGNOSIS — R29703 NIHSS score 3: Secondary | ICD-10-CM | POA: Diagnosis not present

## 2024-03-05 DIAGNOSIS — Z95828 Presence of other vascular implants and grafts: Secondary | ICD-10-CM

## 2024-03-05 DIAGNOSIS — I63213 Cerebral infarction due to unspecified occlusion or stenosis of bilateral vertebral arteries: Secondary | ICD-10-CM

## 2024-03-05 DIAGNOSIS — I7090 Unspecified atherosclerosis: Secondary | ICD-10-CM | POA: Diagnosis not present

## 2024-03-05 DIAGNOSIS — R233 Spontaneous ecchymoses: Secondary | ICD-10-CM | POA: Diagnosis not present

## 2024-03-05 LAB — CBC
HCT: 39 % (ref 39.0–52.0)
Hemoglobin: 12.1 g/dL — ABNORMAL LOW (ref 13.0–17.0)
MCH: 27.9 pg (ref 26.0–34.0)
MCHC: 31 g/dL (ref 30.0–36.0)
MCV: 90.1 fL (ref 80.0–100.0)
Platelets: 165 K/uL (ref 150–400)
RBC: 4.33 MIL/uL (ref 4.22–5.81)
RDW: 13 % (ref 11.5–15.5)
WBC: 6.8 K/uL (ref 4.0–10.5)
nRBC: 0 % (ref 0.0–0.2)

## 2024-03-05 LAB — BASIC METABOLIC PANEL WITH GFR
Anion gap: 10 (ref 5–15)
BUN: 13 mg/dL (ref 8–23)
CO2: 27 mmol/L (ref 22–32)
Calcium: 8.6 mg/dL — ABNORMAL LOW (ref 8.9–10.3)
Chloride: 103 mmol/L (ref 98–111)
Creatinine, Ser: 1.37 mg/dL — ABNORMAL HIGH (ref 0.61–1.24)
GFR, Estimated: 55 mL/min — ABNORMAL LOW (ref >60)
Glucose, Bld: 89 mg/dL (ref 70–99)
Potassium: 3.9 mmol/L (ref 3.5–5.1)
Sodium: 140 mmol/L (ref 135–145)

## 2024-03-05 MED ORDER — SODIUM CHLORIDE 0.9 % IV SOLN: INTRAVENOUS | Status: AC

## 2024-03-05 MED ORDER — PANTOPRAZOLE SODIUM 40 MG PO TBEC
40.0000 mg | DELAYED_RELEASE_TABLET | Freq: Every day | ORAL | Status: DC
  Administered 2024-03-05 – 2024-03-06 (×2): 40 mg via ORAL
  Filled 2024-03-05 (×2): qty 1

## 2024-03-05 MED ORDER — SODIUM CHLORIDE 0.9 % IV BOLUS
500.0000 mL | Freq: Once | INTRAVENOUS | Status: AC
  Administered 2024-03-05: 500 mL via INTRAVENOUS

## 2024-03-05 NOTE — Progress Notes (Addendum)
 STROKE TEAM PROGRESS NOTE    SIGNIFICANT HOSPITAL EVENTS 10/30 presented with sudden onset aphasia and left-sided weakness received IV TNK.  CTA with no LVO  INTERIM HISTORY/SUBJECTIVE No family at the bedside.  No new neurological events overnight neurological exam still remains with dysarthria and slight facial droop Vital signs stable.  Renal function slightly worsening.  Plan to give IV fluids.  Will transfer to skilled nursing facility for rehab when bed available next week CBC    Component Value Date/Time   WBC 6.8 03/05/2024 0236   RBC 4.33 03/05/2024 0236   HGB 12.1 (L) 03/05/2024 0236   HGB 14.3 02/24/2024 1049   HCT 39.0 03/05/2024 0236   HCT 45.5 02/24/2024 1049   PLT 165 03/05/2024 0236   PLT 200 02/24/2024 1049   MCV 90.1 03/05/2024 0236   MCV 92 02/24/2024 1049   MCH 27.9 03/05/2024 0236   MCHC 31.0 03/05/2024 0236   RDW 13.0 03/05/2024 0236   RDW 11.8 02/24/2024 1049   LYMPHSABS 1.0 03/03/2024 1340   MONOABS 0.8 03/03/2024 1340   EOSABS 0.0 03/03/2024 1340   BASOSABS 0.0 03/03/2024 1340    BMET    Component Value Date/Time   NA 140 03/05/2024 0236   NA 143 02/24/2024 1049   K 3.9 03/05/2024 0236   CL 103 03/05/2024 0236   CO2 27 03/05/2024 0236   GLUCOSE 89 03/05/2024 0236   BUN 13 03/05/2024 0236   BUN 17 02/24/2024 1049   CREATININE 1.37 (H) 03/05/2024 0236   CALCIUM  8.6 (L) 03/05/2024 0236   EGFR 52 (L) 02/24/2024 1049   GFRNONAA 55 (L) 03/05/2024 0236    IMAGING past 24 hours   Vitals:   03/05/24 0224 03/05/24 0247 03/05/24 0624 03/05/24 0800  BP: (!) 151/85 (!) 151/85 (!) 151/85 128/74  Pulse:  66  90  Resp:  (!) 24  16  Temp:  98.5 F (36.9 C)  98.5 F (36.9 C)  TempSrc:  Oral    SpO2:  97%  92%  Weight:      Height:         PHYSICAL EXAM General:  Alert, well-nourished, well-developed patient in no acute distress Psych:  Mood and affect appropriate for situation CV: Regular rate and rhythm on monitor Respiratory:  Regular,  unlabored respirations on room air GI: Abdomen soft and nontender   NEURO:  Mental Status: AA&Ox3, patient is able to give clear and coherent history Speech/Language: speech is without aphasia.  Dysarthric speech naming, repetition, fluency, and comprehension intact.  Cranial Nerves:  II: PERRL. Visual fields full.  III, IV, VI: EOM incomplete right lateral gaze eyelids elevate symmetrically.  V: Sensation is intact to light touch and symmetrical to face.  VII: Left facial droop VIII: hearing intact to voice. IX, X: Palate elevates symmetrically. Phonation is normal.  KP:Dynloizm shrug 5/5. XII: tongue is midline without fasciculations. Motor: 5/5 strength to all muscle groups tested.  Tone: is normal and bulk is normal Sensation- Intact to light touch bilaterally. Extinction absent to light touch to DSS.   Coordination: FTN intact bilaterally, HKS: no ataxia in BLE.No drift.  Gait- deferred  Most Recent NIH  1a Level of Conscious.:  1b LOC Questions:  1c LOC Commands:  2 Best Gaze: 1 3 Visual:  4 Facial Palsy: 1 5a Motor Arm - left:  5b Motor Arm - Right:  6a Motor Leg - Left:  6b Motor Leg - Right:  7 Limb Ataxia:  8 Sensory: 1 9  Best Language:  10 Dysarthria:  11 Extinct. and Inatten.:  TOTAL: 3   ASSESSMENT/PLAN  Andre [name] is a 73 y.o. male with history of  hypertension, BPH, polysubstance abuse, pontine stroke with previous basilar stenting and recent tonsillectomy presents with sudden onset aphasia and left-sided weakness.  Patient had a tonsillectomy on 10/28 and was recovering uneventfully and was in his normal state of health at 1230.  He then sat on the couch, threw up and developed sudden onset speaking difficulty, severe dysarthria, left facial droop and left-sided weakness.  CT no acute abnormality.  CTA head and neck no LVO,  NIH on Admission 27  Stroke: b/l cerebellar infarcts, R>L s/p TNK, etiology: likely large vessel disease due to risk  factors Code Stroke  CT head No acute abnormality. ASPECTS 10.    Left cerebellar encephalomalacia consistent with remote infarct.  CTA head & neck Mixed density atherosclerosis at the left ICA, resulting in 70% stenosis. Atherosclerotic calcification of the ICA siphon with circumferential calcification of the distal LICA causing severe stenosis. Multifocal atherosclerosis of both vertebral arteries' V4 segments with moderate stenosis. Patent stent within the distal basilar artery. MRI showed b/l cerebellar infarcts with petechial hemorrhage 2D Echo EF 55 to 60%.  LV with grade 1 diastolic dysfunction LDL 44 HgbA1c 4.6 UDS neg VTE prophylaxis -SCDs clopidogrel  75 mg daily was on hold due to tonsillectomy prior to admission, now on ASA and plavix  for 3 months due to VA stenosis and then plavix  alone.  Therapy recommendations:  SNF Disposition: Pending  Hx of Stroke/TIA 05/2015, MRI showed right pontine infarct, remote right BG and cerebellum infarct.  CTA head and neck showed right ICA 60% stenosis, left ICA 50% stenosis, bilateral ICA siphon, VA and BA stenosis.  EF 50 to 55%, LDL 52, UDS positive for cocaine.  Discharged with DAPT and Zocor  04/2017 admitted for right-sided weakness, slurred speech and right facial droop.  MRI showed left pontine infarct.  CT head and neck bilateral ICA bulb 30 to 40% stenosis.  Severe left siphon stenosis, basilar artery stenosis.  EF 60 to 65%.  A1c 4.6, LDL 45, UDS positive for cocaine.  Discharged with DAPT and Lipitor  80. 03/2021 admitted for bilateral pontine and right PCA scattered infarct. CTA head neck showed mid dental artery occlusion status post IR with TIIC3 and basilar artery stenting.  UDS again positive for cocaine. EF 50-55%. LDL 54 and A1C 4.6. he was discharged on aspirin  and Brilinta  due to basilar artery stenting.   Per niece, patient recovered well, using cane for walking but no significant weakness, conversing well, home medication including  Plavix  75 and Lipitor  80.  His plavix  was on hold for the tonsillectomy and plan to re-start 10/30  Hypertension Home meds: Amlodipine  10 mg Stable Avoid low BP Long term BP goal normotensive  Hyperlipidemia Home meds: Atorvastatin  80 mg,  resumed in hospital LDL 44, goal < 70 Continue statin at discharge  Concern for aspiration pneumonia Started on Unasyn  Chest x-ray Mild cardiomegaly with mild central vascular congestion.  Symptoms has improved and secretions are minimal now  Substance Abuse Patient has history of marijuana and cocaine use UDS negative this admission       Ready to quit? Yes TOC consult for cessation placed  Dysphagia Patient has post-stroke dysphagia, SLP consulted On dys 3 and thin liquid Advance diet as tolerated  Other Stroke Risk Factors  ETOH use, alcohol level <15, advised to drink no more than 2 drink(s) a  day Obesity, Body mass index is 31.9 kg/m., BMI >/= 30 associated with increased stroke risk, recommend weight loss, diet and exercise as appropriate Congestive heart failure  Other Active Problems BPH AKI-Cre 1.52->1.40->1.25 -1.37 continue fluids and will monitor Recent tonsillectomy on 03/01/24  Hospital day # 2   Karna Geralds DNP, ACNPC-AG  [facility]  I have personally obtained history,examined this patient, reviewed notes, independently viewed imaging studies, participated in medical decision making and plan of care.ROS completed by me personally and pertinent positives fully documented  I have made any additions or clarifications directly to the above note. Agree with note above.  Patient neurologically stable.  Renal function slightly worsening.  Will give cautious hydration.  Continue therapies.  Transfer to skilled nursing facility rehab next week when bed available after insurance approval.  No family at the bedside   I personally spent a total of 35 minutes in the care of the patient today including getting/reviewing  separately obtained history, performing a medically appropriate exam/evaluation, counseling and educating, placing orders, referring and communicating with other health care professionals, documenting clinical information in the EHR, independently interpreting results, and coordinating care.         Eather Popp, MD Medical Director [facility] Pager: [phone] 03/05/2024 4:47 PM    To contact Stroke Continuity provider, please refer to [email or web address]. After hours, contact General Neurology

## 2024-03-05 NOTE — Plan of Care (Signed)
  Problem: Ischemic Stroke/TIA Tissue Perfusion: Goal: Complications of ischemic stroke/TIA will be minimized Outcome: Progressing   Problem: Coping: Goal: Will verbalize positive feelings about self Outcome: Progressing   Problem: Self-Care: Goal: Ability to participate in self-care as condition permits will improve Outcome: Progressing   Problem: Nutrition: Goal: Risk of aspiration will decrease Outcome: Progressing

## 2024-03-06 DIAGNOSIS — R233 Spontaneous ecchymoses: Secondary | ICD-10-CM | POA: Diagnosis not present

## 2024-03-06 DIAGNOSIS — R29703 NIHSS score 3: Secondary | ICD-10-CM | POA: Diagnosis not present

## 2024-03-06 DIAGNOSIS — I63213 Cerebral infarction due to unspecified occlusion or stenosis of bilateral vertebral arteries: Secondary | ICD-10-CM | POA: Diagnosis not present

## 2024-03-06 DIAGNOSIS — I7090 Unspecified atherosclerosis: Secondary | ICD-10-CM | POA: Diagnosis not present

## 2024-03-06 LAB — CBC
HCT: 40.4 % (ref 39.0–52.0)
Hemoglobin: 13 g/dL (ref 13.0–17.0)
MCH: 28.5 pg (ref 26.0–34.0)
MCHC: 32.2 g/dL (ref 30.0–36.0)
MCV: 88.6 fL (ref 80.0–100.0)
Platelets: 187 K/uL (ref 150–400)
RBC: 4.56 MIL/uL (ref 4.22–5.81)
RDW: 12.8 % (ref 11.5–15.5)
WBC: 6 K/uL (ref 4.0–10.5)
nRBC: 0 % (ref 0.0–0.2)

## 2024-03-06 LAB — BASIC METABOLIC PANEL WITH GFR
Anion gap: 9 (ref 5–15)
BUN: 13 mg/dL (ref 8–23)
CO2: 25 mmol/L (ref 22–32)
Calcium: 8.5 mg/dL — ABNORMAL LOW (ref 8.9–10.3)
Chloride: 105 mmol/L (ref 98–111)
Creatinine, Ser: 1.3 mg/dL — ABNORMAL HIGH (ref 0.61–1.24)
GFR, Estimated: 59 mL/min — ABNORMAL LOW (ref >60)
Glucose, Bld: 93 mg/dL (ref 70–99)
Potassium: 3.9 mmol/L (ref 3.5–5.1)
Sodium: 139 mmol/L (ref 135–145)

## 2024-03-06 MED ORDER — POLYETHYLENE GLYCOL 3350 17 G PO PACK
17.0000 g | PACK | Freq: Every day | ORAL | Status: DC
  Administered 2024-03-06 – 2024-03-07 (×2): 17 g via ORAL
  Filled 2024-03-06 (×2): qty 1

## 2024-03-06 NOTE — Progress Notes (Addendum)
 STROKE TEAM PROGRESS NOTE    SIGNIFICANT HOSPITAL EVENTS 10/30 presented with sudden onset aphasia and left-sided weakness received IV TNK.  CTA with no LVO  INTERIM HISTORY/SUBJECTIVE No family at the bedside.  No new neurological events overnight neurological exam still remains with dysarthria and slight facial droop Vital signs stable.  Renal function slightly worsening.  Plan to continue IVF til tomorrow and recheck labs   CBC    Component Value Date/Time   WBC 6.0 03/06/2024 0348   RBC 4.56 03/06/2024 0348   HGB 13.0 03/06/2024 0348   HGB 14.3 02/24/2024 1049   HCT 40.4 03/06/2024 0348   HCT 45.5 02/24/2024 1049   PLT 187 03/06/2024 0348   PLT 200 02/24/2024 1049   MCV 88.6 03/06/2024 0348   MCV 92 02/24/2024 1049   MCH 28.5 03/06/2024 0348   MCHC 32.2 03/06/2024 0348   RDW 12.8 03/06/2024 0348   RDW 11.8 02/24/2024 1049   LYMPHSABS 1.0 03/03/2024 1340   MONOABS 0.8 03/03/2024 1340   EOSABS 0.0 03/03/2024 1340   BASOSABS 0.0 03/03/2024 1340    BMET    Component Value Date/Time   NA 139 03/06/2024 0348   NA 143 02/24/2024 1049   K 3.9 03/06/2024 0348   CL 105 03/06/2024 0348   CO2 25 03/06/2024 0348   GLUCOSE 93 03/06/2024 0348   BUN 13 03/06/2024 0348   BUN 17 02/24/2024 1049   CREATININE 1.30 (H) 03/06/2024 0348   CALCIUM  8.5 (L) 03/06/2024 0348   EGFR 52 (L) 02/24/2024 1049   GFRNONAA 59 (L) 03/06/2024 0348    IMAGING past 24 hours   Vitals:   03/06/24 0224 03/06/24 0241 03/06/24 0728 03/06/24 1118  BP: (!) 154/91 (!) 154/91 (!) 146/83 (!) 158/76  Pulse:  61 77 74  Resp:  (!) 23 18 (!) 22  Temp:  98.4 F (36.9 C) 97.7 F (36.5 C) 97.9 F (36.6 C)  TempSrc:  Oral Oral Oral  SpO2:  94% 91% 95%  Weight:      Height:         PHYSICAL EXAM General:  Alert, well-nourished, well-developed patient in no acute distress Psych:  Mood and affect appropriate for situation CV: Regular rate and rhythm on monitor Respiratory:  Regular, unlabored  respirations on room air GI: Abdomen soft and nontender   NEURO:  Mental Status: AA&Ox3, patient is able to give clear and coherent history Speech/Language: speech is without aphasia.  Dysarthric speech naming, repetition, fluency, and comprehension intact.  Cranial Nerves:  II: PERRL. Visual fields full.  III, IV, VI: EOM incomplete right lateral gaze eyelids elevate symmetrically.  V: Sensation is intact to light touch and symmetrical to face.  VII: Left facial droop VIII: hearing intact to voice. IX, X: Palate elevates symmetrically. Phonation is normal.  KP:Dynloizm shrug 5/5. XII: tongue is midline without fasciculations. Motor: 5/5 strength to all muscle groups tested.  Tone: is normal and bulk is normal Sensation- Intact to light touch bilaterally. Extinction absent to light touch to DSS.   Coordination: FTN intact bilaterally, HKS: no ataxia in BLE.No drift.  Gait- deferred  Most Recent NIH  1a Level of Conscious.:  1b LOC Questions:  1c LOC Commands:  2 Best Gaze: 1 3 Visual:  4 Facial Palsy: 1 5a Motor Arm - left:  5b Motor Arm - Right:  6a Motor Leg - Left:  6b Motor Leg - Right:  7 Limb Ataxia:  8 Sensory: 1 9 Best Language:  10  Dysarthria:  11 Extinct. and Inatten.:  TOTAL: 3   ASSESSMENT/PLAN  [name] is a 73 y.o. male with history of  hypertension, BPH, polysubstance abuse, pontine stroke with previous basilar stenting and recent tonsillectomy presents with sudden onset aphasia and left-sided weakness.  Patient had a tonsillectomy on 10/28 and was recovering uneventfully and was in his normal state of health at 1230.  He then sat on the couch, threw up and developed sudden onset speaking difficulty, severe dysarthria, left facial droop and left-sided weakness.  CT no acute abnormality.  CTA head and neck no LVO,  NIH on Admission 27  Stroke: b/l cerebellar infarcts, R>L s/p TNK, etiology: likely large vessel disease due to risk factors Code  Stroke  CT head No acute abnormality. ASPECTS 10.    Left cerebellar encephalomalacia consistent with remote infarct.  CTA head & neck Mixed density atherosclerosis at the left ICA, resulting in 70% stenosis. Atherosclerotic calcification of the ICA siphon with circumferential calcification of the distal LICA causing severe stenosis. Multifocal atherosclerosis of both vertebral arteries' V4 segments with moderate stenosis. Patent stent within the distal basilar artery. MRI showed b/l cerebellar infarcts with petechial hemorrhage 2D Echo EF 55 to 60%.  LV with grade 1 diastolic dysfunction LDL 44 HgbA1c 4.6 UDS neg VTE prophylaxis -SCDs clopidogrel  75 mg daily was on hold due to tonsillectomy prior to admission, now on ASA and plavix  for 3 months due to VA stenosis and then plavix  alone.  Therapy recommendations:  SNF Disposition: Pending  Hx of Stroke/TIA 05/2015, MRI showed right pontine infarct, remote right BG and cerebellum infarct.  CTA head and neck showed right ICA 60% stenosis, left ICA 50% stenosis, bilateral ICA siphon, VA and BA stenosis.  EF 50 to 55%, LDL 52, UDS positive for cocaine.  Discharged with DAPT and Zocor  04/2017 admitted for right-sided weakness, slurred speech and right facial droop.  MRI showed left pontine infarct.  CT head and neck bilateral ICA bulb 30 to 40% stenosis.  Severe left siphon stenosis, basilar artery stenosis.  EF 60 to 65%.  A1c 4.6, LDL 45, UDS positive for cocaine.  Discharged with DAPT and Lipitor  80. 03/2021 admitted for bilateral pontine and right PCA scattered infarct. CTA head neck showed mid dental artery occlusion status post IR with TIIC3 and basilar artery stenting.  UDS again positive for cocaine. EF 50-55%. LDL 54 and A1C 4.6. he was discharged on aspirin  and Brilinta  due to basilar artery stenting.   Per niece, patient recovered well, using cane for walking but no significant weakness, conversing well, home medication including Plavix  75 and  Lipitor  80.  His plavix  was on hold for the tonsillectomy and plan to re-start 10/30  Hypertension Home meds: Amlodipine  10 mg Stable Avoid low BP Long term BP goal normotensive  Hyperlipidemia Home meds: Atorvastatin  80 mg,  resumed in hospital LDL 44, goal < 70 Continue statin at discharge  Concern for aspiration pneumonia Started on Unasyn  Chest x-ray Mild cardiomegaly with mild central vascular congestion.  Symptoms has improved and secretions are minimal now  Substance Abuse Patient has history of marijuana and cocaine use UDS negative this admission       Ready to quit? Yes TOC consult for cessation placed  Dysphagia Patient has post-stroke dysphagia, SLP consulted On dys 3 and thin liquid Advance diet as tolerated  Other Stroke Risk Factors  ETOH use, alcohol level <15, advised to drink no more than 2 drink(s) a day Obesity, Body mass  index is 31.9 kg/m., BMI >/= 30 associated with increased stroke risk, recommend weight loss, diet and exercise as appropriate Congestive heart failure  Other Active Problems BPH AKI-Cre 1.52->1.40->1.25 -1.37-1.30 continue fluids and will monitor. Recheck labs in the am  Recent tonsillectomy on 03/01/24  Hospital day # 3   Karna Geralds DNP, ACNPC-AG  [facility]  I have personally obtained history,examined this patient, reviewed notes, independently viewed imaging studies, participated in medical decision making and plan of care.ROS completed by me personally and pertinent positives fully documented  I have made any additions or clarifications directly to the above note. Agree with note above.    Eather Popp, MD Medical Director [facility] Pager: [phone] 03/06/2024 12:54 PM    To contact Stroke Continuity provider, please refer to [email or web address]. After hours, contact General Neurology

## 2024-03-06 NOTE — Plan of Care (Signed)
  Problem: Ischemic Stroke/TIA Tissue Perfusion: Goal: Complications of ischemic stroke/TIA will be minimized Outcome: Progressing   Problem: Coping: Goal: Will verbalize positive feelings about self Outcome: Progressing Goal: Will identify appropriate support needs Outcome: Progressing   Problem: Health Behavior/Discharge Planning: Goal: Ability to manage health-related needs will improve Outcome: Progressing Goal: Goals will be collaboratively established with patient/family Outcome: Progressing

## 2024-03-07 ENCOUNTER — Encounter (INDEPENDENT_AMBULATORY_CARE_PROVIDER_SITE_OTHER)

## 2024-03-07 DIAGNOSIS — R1310 Dysphagia, unspecified: Secondary | ICD-10-CM

## 2024-03-07 DIAGNOSIS — I639 Cerebral infarction, unspecified: Secondary | ICD-10-CM | POA: Insufficient documentation

## 2024-03-07 DIAGNOSIS — I63213 Cerebral infarction due to unspecified occlusion or stenosis of bilateral vertebral arteries: Secondary | ICD-10-CM | POA: Diagnosis not present

## 2024-03-07 DIAGNOSIS — R29702 NIHSS score 2: Secondary | ICD-10-CM | POA: Diagnosis not present

## 2024-03-07 DIAGNOSIS — E669 Obesity, unspecified: Secondary | ICD-10-CM | POA: Diagnosis present

## 2024-03-07 LAB — BASIC METABOLIC PANEL WITH GFR
Anion gap: 9 (ref 5–15)
BUN: 18 mg/dL (ref 8–23)
CO2: 27 mmol/L (ref 22–32)
Calcium: 8.7 mg/dL — ABNORMAL LOW (ref 8.9–10.3)
Chloride: 104 mmol/L (ref 98–111)
Creatinine, Ser: 1.38 mg/dL — ABNORMAL HIGH (ref 0.61–1.24)
GFR, Estimated: 55 mL/min — ABNORMAL LOW (ref >60)
Glucose, Bld: 90 mg/dL (ref 70–99)
Potassium: 4.6 mmol/L (ref 3.5–5.1)
Sodium: 140 mmol/L (ref 135–145)

## 2024-03-07 LAB — CBC
HCT: 40.4 % (ref 39.0–52.0)
Hemoglobin: 12.9 g/dL — ABNORMAL LOW (ref 13.0–17.0)
MCH: 28.5 pg (ref 26.0–34.0)
MCHC: 31.9 g/dL (ref 30.0–36.0)
MCV: 89.2 fL (ref 80.0–100.0)
Platelets: 174 K/uL (ref 150–400)
RBC: 4.53 MIL/uL (ref 4.22–5.81)
RDW: 12.9 % (ref 11.5–15.5)
WBC: 6 K/uL (ref 4.0–10.5)
nRBC: 0 % (ref 0.0–0.2)

## 2024-03-07 MED ORDER — ATORVASTATIN CALCIUM 80 MG PO TABS: 80.0000 mg | ORAL_TABLET | Freq: Every day | ORAL | 0 refills | Status: AC

## 2024-03-07 MED ORDER — ASPIRIN 81 MG PO TBEC: 81.0000 mg | DELAYED_RELEASE_TABLET | Freq: Every day | ORAL | 0 refills | Status: DC

## 2024-03-07 MED ORDER — CLOPIDOGREL BISULFATE 75 MG PO TABS: 75.0000 mg | ORAL_TABLET | Freq: Every day | ORAL | 0 refills | Status: AC

## 2024-03-07 NOTE — TOC Transition Note (Signed)
 Transition of Care (TOC) - Discharge Note Rayfield Gobble RN,BSN Inpatient Care Management Unit 4NP (Non Trauma)- RN Case Manager See Treatment Team for direct Phone #   Patient Details  Name: Andre [name] MRN: [id number] Date of Birth: 11/05/1950  Transition of Care [facility]) CM/SW Contact:  Gobble Rayfield Hurst, RN Phone Number: 03/07/2024, 2:43 PM   Clinical Narrative:    Pt stable for transition home today, HH orders placed for PT/OT/SLP.   CM spoke with pt at bedside- per pt he has nephew and niece that assist him and nephew will come transport home.  Pt voiced he has needed DME at home- no new needed identified. Pt also states he has an aide (PCS) that comes M-F from 9-12.  List provided for [facility] choice Per CMS guidelines from [email or web address].pl website with star ratings (copy placed in shadow chart) for skilled needs- pt voiced he did not have a preference on [facility] provider and defers to this CM to secure one.   Address,phone # and PCP all confirmed with pt in Epic.   Call made to [facility] liaison to check staffing availability- all disciplines available- referral sent via Epic Hum- referral has been accepted- [facility] is requesting an RN be added to the orders if attending team agreeable.    Bedside RN to call nephew when pt is ready for transport home.   IP CM interventions have been completed no further needs noted.   Final next level of care: Home w Home Health Services Barriers to Discharge: No Barriers Identified   Patient Goals and CMS Choice Patient states their goals for this hospitalization and ongoing recovery are:: return home CMS Medicare.gov Compare Post Acute Care list provided to:: Patient Choice offered to / list presented to : Patient      Discharge Placement                 Home w/ [facility]      Discharge Plan and Services Additional resources added to the After Visit Summary for     Discharge Planning Services: CM Consult Post Acute Care  Choice: Home Health          DME Arranged: N/A DME Agency: NA       HH Arranged: RN, Disease Management, PT, OT, Speech Therapy HH Agency: [facility] Date [facility] Agency Contacted: 03/07/24 Time HH Agency Contacted: [phone] Representative spoke with at [facility] Agency: Arna  Social Drivers of Health (SDOH) Interventions SDOH Screenings   Food Insecurity: No Food Insecurity (03/05/2024)  Housing: Low Risk  (03/05/2024)  Transportation Needs: No Transportation Needs (03/05/2024)  Utilities: Not At Risk (03/01/2024)  Depression (PHQ2-9): Low Risk  (06/08/2023)  Social Connections: Unknown (03/05/2024)  Tobacco Use: Medium Risk (03/03/2024)     Readmission Risk Interventions    03/07/2024    2:42 PM  Readmission Risk Prevention Plan  Transportation Screening Complete  Home Care Screening Complete  Medication Review (RN CM) Complete

## 2024-03-07 NOTE — Progress Notes (Signed)
 Occupational Therapy Treatment Patient Details Name: Andre [name] MRN: [id number] DOB: July 21, 1950 Today's Date: 03/07/2024   History of present illness 73 yo male presenting to ED 10/30 with sudden onset aphasia and L sided weakness. CTH negative and CTA with stable carotid stenosis, received TNK 1424 10/30. MRI shows Scattered acute to early subacute posterior circulation infarcts involving the BL cerebellar hemispheres R>L, L pons, and R occipital lobe. PMH includes hypertension, BPH, polysubstance abuse, R pontine stroke 2017, L pontine CVA 2018, bilat pontine and R PCA CVA 2022 with previous basilar stenting and recent tonsillectomy on 10/28.   OT comments  Pt in recliner and reports wanting to dc home now for housing meeting (for a more accessible home).  Reports he can have family/friends assist more at dc.  Pt completing transfers with contact guard assist, mobility to bathroom using RW with contact guard to min assist due to RW safety and impaired balance.  Toileting with min assist, grooming at sink with contact guard assist.  Increased dysarthric speech with fatigue during ADLs.  Discussed IADLs, pt reports aide assisting but he manages his meds.  Pt agreeable to have family assist with pill box for safety and only showering when aide is present.  Recommend initial 24/7 support at dc, assist with meds, finances and meals at dc. Based on performance today, pt will best benefit from continued OT services acutely and after dc at [facility] level to optimize independence and safety with ADLs, IADLs and mobility.        If plan is discharge home, recommend the following:  Direct supervision/assist for medications management;Direct supervision/assist for financial management;Assist for transportation;Supervision due to cognitive status;Help with stairs or ramp for entrance;A little help with walking and/or transfers;A little help with bathing/dressing/bathroom;Assistance with cooking/housework    Equipment Recommendations  None recommended by OT    Recommendations for Other Services      Precautions / Restrictions Precautions Precautions: Fall Restrictions Weight Bearing Restrictions Per Provider Order: No       Mobility Bed Mobility               General bed mobility comments: OOB in recliner    Transfers Overall transfer level: Needs assistance Equipment used: Rolling walker (2 wheels) Transfers: Sit to/from Stand Sit to Stand: Contact guard assist           General transfer comment: cueing for hand placement, contact guard for safety     Balance Overall balance assessment: Needs assistance, History of Falls, Independent Sitting-balance support: No upper extremity supported, Feet supported Sitting balance-[name]: Fair     Standing balance support: Bilateral upper extremity supported, During functional activity, Reliant on assistive device for balance, No upper extremity supported Standing balance-[name]: Poor Standing balance comment: able to stand at sink for grooming with contact guard, relies on RW dynamically                           ADL either performed or assessed with clinical judgement   ADL Overall ADL's : Needs assistance/impaired     Grooming: Contact guard assist;Wash/dry hands;Standing           Upper Body Dressing : Set up;Sitting   Lower Body Dressing: Contact guard assist;Sit to/from stand Lower Body Dressing Details (indicate cue type and reason): able to don/doff socks, min guard in standing Toilet Transfer: Contact guard assist;Minimal assistance;Ambulation;Rolling walker (2 wheels) Toilet Transfer Details (indicate cue type and reason): cueing for  safety using RW Toileting- Clothing Manipulation and Hygiene: Minimal assistance;Sit to/from stand Toileting - Clothing Manipulation Details (indicate cue type and reason): posterior hygiene     Functional mobility during ADLs: Contact guard  assist;Rolling walker (2 wheels);Minimal assistance      Extremity/Trunk Assessment              Vision       Perception     Praxis Praxis Praxis: Impaired Praxis Impairment Details: Motor planning   Communication Communication Communication: Impaired Factors Affecting Communication: Reduced clarity of speech (periods of reduced clarity- more when fatigued)   Cognition Arousal: Alert Behavior During Therapy: WFL for tasks assessed/performed Cognition: Cognition impaired     Awareness: Online awareness impaired   Attention impairment (select first level of impairment): Selective attention Executive functioning impairment (select all impairments): Problem solving, Reasoning OT - Cognition Comments: patient with improving cognition, after education agreeable to have assist with med mgmt at dc. Still needs cueing for safety                 Following commands: Intact        Cueing   Cueing Techniques: Verbal cues  Exercises      Shoulder Instructions       General Comments VSS    Pertinent Vitals/ Pain       Pain Assessment Pain Assessment: No/denies pain Pain Intervention(s): Monitored during session  Home Living                                          Prior Functioning/Environment              Frequency  Min 2X/week        Progress Toward Goals  OT Goals(current goals can now be found in the care plan section)  Progress towards OT goals: Progressing toward goals  Acute Rehab OT Goals Patient Stated Goal: home OT Goal Formulation: With patient Time For Goal Achievement: 03/18/24 Potential to Achieve Goals: Good  Plan      Co-evaluation                 AM-PAC OT 6 Clicks Daily Activity     Outcome Measure   Help from another person eating meals?: A Little Help from another person taking care of personal grooming?: A Little Help from another person toileting, which includes using toliet, bedpan, or  urinal?: A Little Help from another person bathing (including washing, rinsing, drying)?: A Little Help from another person to put on and taking off regular upper body clothing?: A Little Help from another person to put on and taking off regular lower body clothing?: A Little 6 Click Score: 18    End of Session Equipment Utilized During Treatment: Gait belt;Rolling walker (2 wheels)  OT Visit Diagnosis: Unsteadiness on feet (R26.81);Other abnormalities of gait and mobility (R26.89);Muscle weakness (generalized) (M62.81);History of falling (Z91.81);Other symptoms and signs involving cognitive function   Activity Tolerance Patient tolerated treatment well   Patient Left in chair;with call bell/phone within reach;with chair alarm set   Nurse Communication Mobility status        Time: 1030-1055 OT Time Calculation (min): 25 min  Charges: OT General Charges $OT Visit: 1 Visit OT Treatments $Self Care/Home Management : 23-37 mins  Etta NOVAK, OT Acute Rehabilitation Services Office [phone] Secure Chat Preferred    Etta GORMAN [place] 03/07/2024, 11:25 AM

## 2024-03-07 NOTE — Plan of Care (Signed)
 ENT plan of care:  Spoke with pt at bedside. Tonsil biopsy showed no evidence of malignancy. Will refer back to CT surgery for further workup of mediastinal lymph node. I will call his nephew and let him know the plan. Does not need follow-up with ENT unless concerns.

## 2024-03-07 NOTE — Progress Notes (Signed)
 Speech Language Pathology Treatment: Dysphagia;Cognitive-Linguistic  Patient Details Name: Andre [name] MRN: [id number] DOB: December 10, 1950 Today's Date: 03/07/2024 Time: 8675-8659 SLP Time Calculation (min) (ACUTE ONLY): 16 min  Assessment / Plan / Recommendation Clinical Impression  Swallowing: Pt positioned upright in the chair eating from his lunch tray. Min cueing was necessary to remind him to clear his throat and reswallow upon noticing a wet vocal quality. Mastication is mildly prolonged secondary to edentulism, though he clears his oral cavity completely and uses a liquid wash as needed. Pt verbalizes his goal to remain on current diet using strategies to help clear pharyngeal residue.   Cognitive-Linguistic: Note pt now plans to d/c home, where he lives alone. At baseline, he manages medications but is currently demonstrating acute deficits related to problem solving and awareness. Pt needed Max multimodal cueing to identify errors in a pillbox activity. Discussed the importance of having increased supervision at home to ensure safety in light of acute deficits s/p CVA, especially when managing medications or finances.    HPI HPI: 73 yo male presenting to ED 10/30 with sudden onset aphasia and L sided weakness. CTH negative and CTA with stable carotid stenosis, received TNK. MRI shows scattered acute to early subacute posterior circulation infarcts involving the bilateral cerebellar hemispheres, L pons, and R occipital lobe. Concern for aspiration due to noisy respirations and rhonchi, CXR negative. CT Soft Tissue 8/29 shows asymmetric soft tissue in the R oropharynx, may represent oropharyngeal carcinoma s/p R tonsillectomy 10/28 for resection (biopsy showed no evidence of malignancy per ENT) . History of pontine stroke 2012 and 2019 with oropharyngeal dysphagia characterized by R sided labial and lingual weakness, generally weak hyoid excursion, pharyngeal contraction, and BOT contraction  resulting in moderate residue that pt clears with effortful swallows. Other PMH includes HTN, BPH, polysubstance abuse, pontine stroke with previous basial stenting (residual  LLE instability, mixed dysarthria, mild dysphagia, sensory and vision deficits)      SLP Plan  Continue with current plan of care          Recommendations  Diet recommendations: Dysphagia 3 (mechanical soft);Thin liquid Liquids provided via: Cup;Straw Medication Administration: Whole meds with puree Supervision: Patient able to self feed;Intermittent supervision to cue for compensatory strategies Compensations: Slow rate;Small sips/bites;Multiple dry swallows after each bite/sip Postural Changes and/or Swallow Maneuvers: Seated upright 90 degrees                  Oral care BID   Frequent or constant Supervision/Assistance Dysphagia, oropharyngeal phase (R13.12);Cognitive communication deficit (M58.158)     Continue with current plan of care     Damien Blumenthal, M.A., CCC-SLP Speech Language Pathology, Acute Rehabilitation Services  Secure Chat preferred [phone]   03/07/2024, 2:18 PM

## 2024-03-07 NOTE — Discharge Summary (Addendum)
 Stroke Discharge Summary  Patient ID: Andre [name]   MRN: [id number]      DOB: [birth year]  Date of Admission: 03/03/2024 Date of Discharge: 03/07/2024  Attending Physician:  Stroke, Md, MD Patient's PCP:  Toma Dimitry, MD  DISCHARGE DIAGNOSIS: Stroke: b/l cerebellar infarcts, R>L s/p TNK, etiology: likely occlusive bilateral vertebral artery   disease   Principal Problem:   Cerebellar infarct [facility]) Active Problems:   Alcohol abuse   Essential hypertension   History of CVA (cerebrovascular accident)   HLD (hyperlipidemia)   BPH (benign prostatic hyperplasia)   Dysphagia   Obesity   Allergies as of 03/07/2024   No Known Allergies      Medication List     STOP taking these medications    aspirin  81 MG chewable tablet Replaced by: aspirin  EC 81 MG tablet   oxyCODONE  5 MG immediate release tablet Commonly known as: Oxy IR/ROXICODONE        TAKE these medications    acetaminophen  500 MG tablet Commonly known as: TYLENOL  Take 2 tablets (1,000 mg total) by mouth every 6 (six) hours for 14 days.   amLODipine  10 MG tablet Commonly known as: NORVASC  TAKE 1 TABLET(10 MG) BY MOUTH DAILY   aspirin  EC 81 MG tablet Take 1 tablet (81 mg total) by mouth daily. Swallow whole. Start taking on: March 08, 2024 Replaces: aspirin  81 MG chewable tablet   atorvastatin  80 MG tablet Commonly known as: LIPITOR  Take 1 tablet (80 mg total) by mouth daily. Start taking on: March 08, 2024 What changed: See the new instructions.   clopidogrel  75 MG tablet Commonly known as: PLAVIX  Take 1 tablet (75 mg total) by mouth daily. Start taking on: March 08, 2024 What changed: See the new instructions.   folic acid  1 MG tablet Commonly known as: FOLVITE  Take 1 tablet (1 mg total) by mouth daily.   mirabegron  ER 25 MG Tb24 tablet Commonly known as: Myrbetriq  Take 1 tablet (25 mg total) by mouth daily at 12 noon. What changed: when to take this   multivitamin Tabs  tablet Take 1 tablet by mouth daily.   senna 8.6 MG tablet Commonly known as: SENOKOT Take 1 tablet by mouth 2 (two) times daily.   sildenafil 50 MG tablet Commonly known as: VIAGRA Take 50 mg by mouth daily as needed for erectile dysfunction.   tamsulosin  0.4 MG Caps capsule Commonly known as: FLOMAX  TAKE 1 CAPSULE(0.4 MG) BY MOUTH DAILY   thiamine  100 MG tablet Commonly known as: VITAMIN B1 Take 1 tablet (100 mg total) by mouth daily.        LABORATORY STUDIES CBC    Component Value Date/Time   WBC 6.0 03/07/2024 0556   RBC 4.53 03/07/2024 0556   HGB 12.9 (L) 03/07/2024 0556   HGB 14.3 02/24/2024 1049   HCT 40.4 03/07/2024 0556   HCT 45.5 02/24/2024 1049   PLT 174 03/07/2024 0556   PLT 200 02/24/2024 1049   MCV 89.2 03/07/2024 0556   MCV 92 02/24/2024 1049   MCH 28.5 03/07/2024 0556   MCHC 31.9 03/07/2024 0556   RDW 12.9 03/07/2024 0556   RDW 11.8 02/24/2024 1049   LYMPHSABS 1.0 03/03/2024 1340   MONOABS 0.8 03/03/2024 1340   EOSABS 0.0 03/03/2024 1340   BASOSABS 0.0 03/03/2024 1340   CMP    Component Value Date/Time   NA 140 03/07/2024 0556   NA 143 02/24/2024 1049   K 4.6 03/07/2024 0556   CL  104 03/07/2024 0556   CO2 27 03/07/2024 0556   GLUCOSE 90 03/07/2024 0556   BUN 18 03/07/2024 0556   BUN 17 02/24/2024 1049   CREATININE 1.38 (H) 03/07/2024 0556   CALCIUM  8.7 (L) 03/07/2024 0556   PROT 6.2 (L) 03/04/2024 0220   ALBUMIN 3.0 (L) 03/04/2024 0220   AST 18 03/04/2024 0220   ALT 16 03/04/2024 0220   ALKPHOS 49 03/04/2024 0220   BILITOT 1.1 03/04/2024 0220   GFRNONAA 55 (L) 03/07/2024 0556   GFRAA >60 12/30/2019 0510   COAGS Lab Results  Component Value Date   INR 1.0 03/03/2024   INR 1.1 03/16/2021   INR 1.0 12/29/2019   Lipid Panel    Component Value Date/Time   CHOL 102 03/04/2024 0220   TRIG 76 03/04/2024 0220   HDL 43 03/04/2024 0220   CHOLHDL 2.4 03/04/2024 0220   VLDL 15 03/04/2024 0220   LDLCALC 44 03/04/2024 0220    HgbA1C  Lab Results  Component Value Date   HGBA1C 4.6 (L) 03/04/2024   Urinalysis    Component Value Date/Time   COLORURINE AMBER (A) 04/13/2021 0639   APPEARANCEUR CLOUDY (A) 04/13/2021 0639   LABSPEC 1.013 04/13/2021 0639   PHURINE 5.0 04/13/2021 0639   GLUCOSEU NEGATIVE 04/13/2021 0639   HGBUR MODERATE (A) 04/13/2021 0639   BILIRUBINUR NEGATIVE 04/13/2021 0639   KETONESUR NEGATIVE 04/13/2021 0639   PROTEINUR 100 (A) 04/13/2021 0639   UROBILINOGEN 0.2 03/27/2011 1149   NITRITE NEGATIVE 04/13/2021 0639   LEUKOCYTESUR LARGE (A) 04/13/2021 0639   Urine Drug Screen     Component Value Date/Time   LABOPIA NONE DETECTED 03/04/2024 0834   COCAINSCRNUR NONE DETECTED 03/04/2024 0834   LABBENZ NONE DETECTED 03/04/2024 0834   AMPHETMU NONE DETECTED 03/04/2024 0834   THCU NONE DETECTED 03/04/2024 0834   LABBARB NONE DETECTED 03/04/2024 0834    Alcohol Level    Component Value Date/Time   ETH <15 03/03/2024 1340    SIGNIFICANT DIAGNOSTIC STUDIES * ADDENDUM #1 ** ADDENDUM: With comparison to CTA head neck of 03/16/21 the stenoses of the left ICA are unchanged.   ----------------------------------------------------   Electronically signed by: Franky Stanford MD 03/03/2024 02:23 PM EDT RP Workstation: [id number]    Addended by Stanford Franky KIDD, MD on 03/03/2024  2:23 PM     Narrative & Impression  ** ORIGINAL REPORT ** EXAM: CTA Head and Neck with Intravenous Contrast. CT Head without Contrast. CLINICAL HISTORY: Neuro deficit, acute, stroke suspected. CT HEAD: BRAIN:No acute intraparenchymal hemorrhage. No mass lesion. No CT evidence for acute territorial infarct. No midline shift or extra-axial collection. VENTRICLES:No hydrocephalus. ORBITS:The orbits are unremarkable. SINUSES AND MASTOIDS:The paranasal sinuses and mastoid air cells are clear. CTA NECK: COMMON CAROTID ARTERIES: No significant stenosis. No dissection or occlusion. INTERNAL CAROTID  ARTERIES: Right ICA: Bulky atherosclerotic calcification at the right carotid bifurcation which extends into the proximal right ICA. There is less than 50% stenosis by NASCET criteria. Left PRJ:Fpkzi density atherosclerosis at the left carotid bifurcation and proximal ICA resulting in 70% stenosis. No dissection or occlusion. VERTEBRAL ARTERIES: Codominant vertebral arteries. Multifocal atherosclerosis of both vertebral arteries V4 segments with moderate stenosis. No dissection or occlusion. CTA HEAD: ANTERIOR CEREBRAL ARTERIES: No significant stenosis. No occlusion. No aneurysm. MIDDLE CEREBRAL ARTERIES: No significant stenosis. No occlusion. No aneurysm. POSTERIOR CEREBRAL ARTERIES: No significant stenosis. No occlusion. No aneurysm BASILAR ARTERY:Patent stent within the distal basilar artery. No significant stenosis or occlusion. No aneurysm. OTHER:Mild atherosclerotic calcification of the internal  carotid arteries at the skull base with circumferential calcification of the distal left clinoid segment causing severe stenosis. SOFT TISSUES:No acute finding. No masses or lymphadenopathy.BONES:No acute osseous abnormality.LUNGS: Mild biapical emphysema.AORTA:Mild calcific aortic atherosclerosis. IMPRESSION: 1. No acute intracranial hemorrhage or ischemic change.2. Mixed density atherosclerosis at the left carotid bifurcation and proximal ICA, resulting in 70% stenosis.3. Atherosclerotic calcification of the internal carotid arteries at the skull base with circumferential calcification of the distal left clinoid segment causing severe stenosis.4. Multifocal atherosclerosis of both vertebral arteries' V4 segments with moderate stenosis.5. Patent stent within the distal basilar artery. Electronically signed by: Franky Stanford MD 03/03/2024 02:20 PM EDT RP Workstation: [id number]    EXAM: MRI HEAD WITHOUT CONTRAST TECHNIQUE: Multiplanar, multiecho pulse sequences of the brain and  surrounding structures were obtained without intravenous contrast. COMPARISON:  Comparison made with CTs from 03/03/2024 as well as previous MRI from 04/13/2021. FINDINGS: Brain: Generalized age-related cerebral atrophy. Patchy T2/FLAIR hyperintensity involving the periventricular and deep white matter, consistent with chronic small vessel ischemic disease, mild for age. Multiple scattered remote lacunar infarcts present about the bilateral basal ganglia, thalami, and pons. Chronic left cerebellar infarct noted.Patchy restricted diffusion seen involving the superior right cerebellar hemisphere, right SCA distribution. Additional restricted diffusion seen involving the contralateral left cerebellar hemisphere, adjacent to the chronic left cerebellar infarct. Subtle patchy involvement of the left pons (series 4, image 20). Mild patchy involvement at the right occipital lobe, right PCA distribution (series 3, image 21). Underlying T2/FLAIR signal abnormality with mildly decreased ADC signal. Findings consistent with acute to early subacute ischemic infarcts. Associated petechial hemorrhage about the cerebellar head parks without malignant hemorrhagic transformation or significant regional mass effect. No other acute intracranial hemorrhage. Few scattered chronic micro hemorrhages noted, most pronounced about the thalami, likely hypertensive in nature.No mass lesion or midline shift. No hydrocephalus or extra-axial fluid collection. Pituitary gland within normal limits. Vascular: Major intracranial vascular flow voids are maintained. Skull and upper cervical spine: Cranial junction within normal limits. Bone marrow signal intensity overall within normal limits. No scalp soft tissue abnormality. Sinuses/Orbits: Globes orbital soft tissues within normal limits. Paranasal sinuses are largely clear. No significant mastoid effusion.Other: None. IMPRESSION: 1. Scattered acute to early subacute  posterior circulation infarcts involving the bilateral cerebellar hemispheres, left pons, and right occipital lobe as above. Associated petechial hemorrhage without malignant hemorrhagic transformation or significant regional mass effect. 2. Underlying age-related cerebral atrophy with chronic small vessel ischemic disease, with multiple remote lacunar infarcts involving the bilateral basal ganglia, thalami, and pons. Additional chronic left cerebellar infarct.  HISTORY OF PRESENT ILLNESS  73 y.o. male with history of  hypertension, BPH, polysubstance abuse, pontine stroke with previous basilar stenting and recent tonsillectomy presents with sudden onset aphasia and left-sided weakness.  Patient had a tonsillectomy on 10/28 and was recovering uneventfully and was in his normal state of health at 1230.  He then sat on the couch, threw up and developed sudden onset speaking difficulty, severe dysarthria, left facial droop and left-sided weakness.  CT no acute abnormality.  CTA head and neck no LVO,  NIH on Admission 27    HOSPITAL COURSE  Stroke: b/l cerebellar infarcts, R>L s/p TNK, etiology: likely large vessel disease due to risk factors Code Stroke  CT head No acute abnormality. ASPECTS 10.    Left cerebellar encephalomalacia consistent with remote infarct.  CTA head & neck Mixed density atherosclerosis at the left ICA, resulting in 70% stenosis. Atherosclerotic calcification of the ICA siphon with circumferential calcification  of the distal LICA causing severe stenosis. Multifocal atherosclerosis of both vertebral arteries' V4 segments with moderate stenosis. Patent stent within the distal basilar artery. MRI showed b/l cerebellar infarcts with petechial hemorrhage 2D Echo EF 55 to 60%.  LV with grade 1 diastolic dysfunction LDL 44 HgbA1c 4.6 UDS neg VTE prophylaxis -SCDs clopidogrel  75 mg daily was on hold due to tonsillectomy prior to admission, continue ASA and plavix  for 3 months due  to VA stenosis and then plavix  alone.  Therapy recommendations:  SNF Disposition: Pending   Hx of Stroke/TIA 05/2015, MRI showed right pontine infarct, remote right BG and cerebellum infarct.  CTA head and neck showed right ICA 60% stenosis, left ICA 50% stenosis, bilateral ICA siphon, VA and BA stenosis.  EF 50 to 55%, LDL 52, UDS positive for cocaine.  Discharged with DAPT and Zocor  04/2017 admitted for right-sided weakness, slurred speech and right facial droop.  MRI showed left pontine infarct.  CT head and neck bilateral ICA bulb 30 to 40% stenosis.  Severe left siphon stenosis, basilar artery stenosis.  EF 60 to 65%.  A1c 4.6, LDL 45, UDS positive for cocaine.  Discharged with DAPT and Lipitor  80. 03/2021 admitted for bilateral pontine and right PCA scattered infarct. CTA head neck showed mid dental artery occlusion status post IR with TIIC3 and basilar artery stenting.  UDS again positive for cocaine. EF 50-55%. LDL 54 and A1C 4.6. he was discharged on aspirin  and Brilinta  due to basilar artery stenting.   Per niece, patient recovered well, using cane for walking but no significant weakness, conversing well, home medication including Plavix  75 and Lipitor  80.  His plavix  was on hold for the tonsillectomy and plan to re-start 10/30  Hypertension Home meds: Amlodipine  10 mg, resume on discharge Stable Avoid low BP Long term BP goal normotensive   Hyperlipidemia Home meds: Atorvastatin  80 mg,  resumed in hospital LDL 44, goal < 70 Continue statin at discharge   Substance Abuse Patient has history of marijuana and cocaine use UDS negative this admission       Ready to quit? Yes TOC consult for cessation placed   Dysphagia Patient has post-stroke dysphagia, SLP consulted On dys 3 and thin liquid HH SLP ordered  Other Stroke Risk Factors  ETOH use, alcohol level <15, advised to drink no more than 2 drink(s) a day Obesity, Body mass index is 31.9 kg/m., BMI >/= 30 associated with  increased stroke risk, recommend weight loss, diet and exercise as appropriate Congestive heart failure   Other Active Problems BPH AKI-Cre 1.52->1.40->1.25 -1.37-1.30-1.38 Recent tonsillectomy on 03/01/24  DISCHARGE EXAM Blood pressure 132/86, pulse 67, temperature 98.7 F (37.1 C), temperature source Oral, resp. rate 20, height 5' 7 (1.702 m), weight 92.4 kg, SpO2 98%.  PHYSICAL EXAM General:  Alert, well-nourished, well-developed patient in no acute distress Psych:  Mood and affect appropriate for situation CV: Regular rate and rhythm on monitor Respiratory:  Regular, unlabored respirations on room air   NEURO:  Mental Status: AA&Ox3 Speech/Language: speech is without aphasia.  Dysarthric speech naming, repetition, fluency, and comprehension intact.   Cranial Nerves:  II: PERRL. Visual fields full.  III, IV, VI: EOMI  V: Sensation is intact to light touch and symmetrical to face.  VII: Left facial droop, improved VIII: hearing intact to voice. IX, X: Palate elevates symmetrically. Phonation is normal.  KP:Dynloizm shrug 5/5. XII: tongue is midline without fasciculations. Motor: 5/5 strength to all muscle groups tested.  Tone: is normal and  bulk is normal Sensation- Intact to light touch bilaterally. Coordination: FTN intact bilaterally, HKS: no ataxia in BLE Gait- deferred Discharge NIH: 2  Discharge Diet       Diet   DIET DYS 3 Room service appropriate? Yes with Assist; Fluid consistency: Thin   liquids  DISCHARGE PLAN Disposition:  home 11/3 aspirin  81 mg daily and clopidogrel  75 mg daily for secondary stroke prevention for 3 months then clopidogrel  (Plavix ) alone. Ongoing stroke risk factor control by Primary Care Physician at time of discharge Follow-up PCP Shitarev, Dimitry, MD in 2 weeks. Follow-up in [facility] in 8 weeks, office to schedule an appointment.   35 minutes were spent preparing discharge.  Rocky JAYSON Likes,  DNP [facility]  I have personally obtained history,examined this patient, reviewed notes, independently viewed imaging studies, participated in medical decision making and plan of care.ROS completed by me personally and pertinent positives fully documented  I have made any additions or clarifications directly to the above note. Agree with note above.    Eather Popp, MD Medical Director [facility] Pager: [phone] 03/07/2024 3:43 PM

## 2024-03-07 NOTE — Progress Notes (Signed)
 Physical Therapy Treatment Patient Details Name: Andre [name] MRN: [id number] DOB: [birth year] Today's Date: 03/07/2024   History of Present Illness 73 yo male presenting to ED 10/30 with sudden onset aphasia and L sided weakness. CTH negative and CTA with stable carotid stenosis, received TNK 1424 10/30. MRI shows Scattered acute to early subacute posterior circulation infarcts involving the BL cerebellar hemispheres R>L, L pons, and R occipital lobe. PMH includes hypertension, BPH, polysubstance abuse, R pontine stroke 2017, L pontine CVA 2018, bilat pontine and R PCA CVA 2022 with previous basilar stenting and recent tonsillectomy on 10/28.    PT Comments  Pt states he has been getting up with RN staff over the weekend, reports mobility improvement vs Friday eval. Pt overall requiring intermittent light steadying assist during transfers and short-distance gait, use of RW for balance. R>LLE incoordination noted. Pt tolerating varying BOS sit<>stands well, though struggles with narrow base given incoordination. Pt concerned about going to a housing meeting on Thursday, PT explained current plan is rehab post-acutely but pt interested in going home instead. PT to increase frequency and plan for d/c home with HHPT per pt request. Pt would benefit from increased aide time or family/friends support at d/c if possible.       If plan is discharge home, recommend the following: A little help with walking and/or transfers;A little help with bathing/dressing/bathroom   Can travel by private vehicle     Yes  Equipment Recommendations  None recommended by PT    Recommendations for Other Services       Precautions / Restrictions Precautions Precautions: Fall Restrictions Weight Bearing Restrictions Per Provider Order: No     Mobility  Bed Mobility Overal bed mobility: Needs Assistance Bed Mobility: Supine to Sit, Sit to Supine     Supine to sit: Supervision, HOB elevated, Used rails      General bed mobility comments: use of HOB elevation and bed rails    Transfers Overall transfer level: Needs assistance Equipment used: Rolling walker (2 wheels) Transfers: Sit to/from Stand Sit to Stand: Min assist           General transfer comment: light rise and steady assist    Ambulation/Gait Ambulation/Gait assistance: Min assist, Contact guard assist Gait Distance (Feet): 100 Feet Assistive device: Rolling walker (2 wheels) Gait Pattern/deviations: Step-through pattern, Decreased stride length, Ataxic Gait velocity: decr     General Gait Details: occasional steadying assist, cues for upright posture. R>L LE incoordination noted during gait   Stairs             Wheelchair Mobility     Tilt Bed    Modified Rankin (Stroke Patients Only) Modified Rankin (Stroke Patients Only) Pre-Morbid Rankin Score: Slight disability Modified Rankin: Moderately severe disability     Balance Overall balance assessment: Needs assistance, History of Falls, Independent Sitting-balance support: No upper extremity supported, Feet supported Sitting balance-[name]: Fair     Standing balance support: Bilateral upper extremity supported, During functional activity, Reliant on assistive device for balance Standing balance-[name]: Poor                              Communication Communication Communication: Impaired Factors Affecting Communication: Reduced clarity of speech (periods of reduced clarity)  Cognition Arousal: Alert Behavior During Therapy: WFL for tasks assessed/performed   PT - Cognitive impairments: Problem solving, Safety/Judgement, Memory, No family/caregiver present to determine baseline  PT - Cognition Comments: follows one-step commands but occasionally requires demonstrative cues Following commands: Intact      Cueing Cueing Techniques: Verbal cues  Exercises Other Exercises Other Exercises:  sit<>stand x5, wide base sit<>stand x5, narrow base sit<>stand x5 - light rise and sit assist and use of bilat UEs to rise especially with altered BOS    General Comments General comments (skin integrity, edema, etc.): vss      Pertinent Vitals/Pain Pain Assessment Pain Assessment: Faces Faces Pain Scale: No hurt Pain Intervention(s): Monitored during session    Home Living                          Prior Function            PT Goals (current goals can now be found in the care plan section) Acute Rehab PT Goals PT Goal Formulation: With patient Time For Goal Achievement: 03/18/24 Potential to Achieve Goals: Good Progress towards PT goals: Progressing toward goals    Frequency    Min 2X/week      PT Plan      Co-evaluation              AM-PAC PT 6 Clicks Mobility   Outcome Measure  Help needed turning from your back to your side while in a flat bed without using bedrails?: A Little Help needed moving from lying on your back to sitting on the side of a flat bed without using bedrails?: A Little Help needed moving to and from a bed to a chair (including a wheelchair)?: A Little Help needed standing up from a chair using your arms (e.g., wheelchair or bedside chair)?: A Little Help needed to walk in hospital room?: A Little Help needed climbing 3-5 steps with a railing? : A Lot 6 Click Score: 17    End of Session Equipment Utilized During Treatment: Gait belt Activity Tolerance: Patient limited by fatigue Patient left: in chair;with chair alarm set;with call bell/phone within reach;with family/visitor present Nurse Communication: Mobility status PT Visit Diagnosis: Other abnormalities of gait and mobility (R26.89);Muscle weakness (generalized) (M62.81)     Time: 9081-9060 PT Time Calculation (min) (ACUTE ONLY): 21 min  Charges:    $Gait Training: 8-22 mins PT General Charges $$ ACUTE PT VISIT: 1 Visit                     Johana RAMAN, PT  DPT Acute Rehabilitation Services Secure Chat Preferred  Office [phone]    Roisin Mones E Stroup 03/07/2024, 10:03 AM

## 2024-03-07 NOTE — Progress Notes (Signed)
 Pt with orders to d/c home. Nephew at bedside to transport pt. PIV removed. Discharge education provided to pt and nephew all questions answered. Pt is stable. Pt and all belongings transported to private vehicle.

## 2024-03-08 ENCOUNTER — Ambulatory Visit: Admitting: Thoracic Surgery (Cardiothoracic Vascular Surgery)

## 2024-03-09 ENCOUNTER — Ambulatory Visit: Payer: Self-pay | Admitting: Family Medicine

## 2024-03-24 ENCOUNTER — Ambulatory Visit: Admitting: Family Medicine

## 2024-03-24 ENCOUNTER — Ambulatory Visit: Payer: Self-pay | Admitting: Otolaryngology

## 2024-03-24 VITALS — BP 131/76 | HR 96 | Wt 196.0 lb

## 2024-03-24 DIAGNOSIS — N529 Male erectile dysfunction, unspecified: Secondary | ICD-10-CM | POA: Diagnosis not present

## 2024-03-24 DIAGNOSIS — Z8673 Personal history of transient ischemic attack (TIA), and cerebral infarction without residual deficits: Secondary | ICD-10-CM | POA: Diagnosis present

## 2024-03-24 DIAGNOSIS — J358 Other chronic diseases of tonsils and adenoids: Secondary | ICD-10-CM | POA: Diagnosis not present

## 2024-03-24 DIAGNOSIS — I10 Essential (primary) hypertension: Secondary | ICD-10-CM

## 2024-03-24 DIAGNOSIS — E785 Hyperlipidemia, unspecified: Secondary | ICD-10-CM

## 2024-03-24 MED ORDER — SILDENAFIL CITRATE 50 MG PO TABS: 50.0000 mg | ORAL_TABLET | Freq: Every day | ORAL | 0 refills | Status: AC | PRN

## 2024-03-24 NOTE — Patient Instructions (Addendum)
 It was great to see you today! Thank you for choosing [facility] for your primary care.  Today we addressed: Stroke I am glad that you are doing well after your stroke.  Please keep working with that home health therapy folks because they will help you get your strength and balance back. Please continue taking your aspirin  and your clopidogrel  (Plavix ), these medicines are very important after a stroke.  I have refilled your Viagra.  Please remember that this medicine can make you feel dizzy when you take it, and given your recent stroke you have a higher risk of falling already.  Please use it very cautiously and be very careful standing up.  You should return to our clinic in 2 months on Friday January 23rd at 9:30 am.  Thank you for coming to see us  at [facility] and for the opportunity to care for you! Toma, Allecia Bells, MD 03/24/2024, 2:02 PM

## 2024-03-24 NOTE — Assessment & Plan Note (Signed)
 Benign pathology result reviewed with patient, no further follow up.

## 2024-03-24 NOTE — Assessment & Plan Note (Signed)
 LDL well-controlled at 44, goal <70. - Continue atorvastatin  80 mg daily

## 2024-03-24 NOTE — Progress Notes (Signed)
 SUBJECTIVE:   CHIEF COMPLAINT / HPI:  Andre [name] is a 73 y.o. male with a pertinent past medical history of HLD, HTN, CVA in 2012 and 2018, presenting to the clinic for hospital follow up after bilateral cerebellar infarcts with petechial hemorrhage.  Patient was hospitalized from 10/30 through 11/3 with neurology service at [facility].  Hospital follow up, bilateral cerebellar infarcts Of note, patient had uneventful tonsillectomy on 10/28 for which clopidogrel  was held and was recovering normally when he experienced sudden emesis and severe dysarthria with left-sided facial droop and left-sided weakness, at which point he was brought to [facility] ED.  CTA head and neck showed mixed density atherosclerosis of the left ICA 70% stenosis as well as atherosclerotic calcification of ICA siphon with circumferential calcification of distal LICA causing severe stenosis.  There is multifocal atherosclerosis of both vertebral arteries before segments moderate stenosis.  The patient's basilar artery stent was patent. MRI brain showed bilateral cerebellar infarcts with petechial hemorrhage.  Patient was discharged with plan for DAPT with aspirin  81 mg daily and clopidogrel  75 mg daily for 3 months then clopidogrel  alone, suspect indefinitely.  Today, patient's niece is waiting outside and patient prefers to be seen alone.  Patient reports he is taking clopidogrel  and aspirin  since discharge. Patient reports that he has increased LLE weakness and diminished balance since his CVA. Patient reports he did not want SNF, but has been getting HH PT for balance issues. HH SLP is also coming by.  Patient denies dysphagia, states he is drinking and eating well. He reports he will be moving housing to a different unit with only on story so he does not have to walk up steps as much, he currently lives in a 2 story home with bedroom and shower upstairs.  ?AKI Noted to have creatinine persistently in  1.3-1.4 range, including leading up to tonsillectomy.  No significant risk factors for CKD.  Suspect this is his baseline at this point, do not favor rechecking today.   PERTINENT PMH / PSH: HTN, HLD, and BPH Cocaine use approximately 3 years ago, UDS negative in hospital CVA 2017 right pontine infarct CVA 2018 left pontine infarct CVA 2022 bilateral pontine and right PCA scattered infarct, underwent basilar artery stenting CVA 03/03/2024 as above  *Remainder reviewed in problem list.   OBJECTIVE:   BP 131/76   Pulse 96   Wt 196 lb (88.9 kg)   SpO2 99%   BMI 30.70 kg/m   General: Age-appropriate, resting comfortably in chair, NAD, alert and at baseline. HEENT:  Head: Normocephalic, atraumatic. Eyes: PERRLA. No conjunctival erythema or scleral injections. Mouth/Oral: Clear, no tonsillar exudate. MMM.  No teeth. Neck: Supple. No LAD. Cardiovascular: Regular rate and rhythm. Normal S1/S2. No murmurs, rubs, or gallops appreciated. 2+ radial pulses. Pulmonary: Clear bilaterally to ascultation. No wheezes, crackles, or rhonchi. Normal WOB on room air. No accessory muscle use. Abdominal: No tenderness to deep or light palpation. No rebound or guarding. No HSM. Skin: Warm and dry. Extremities: No peripheral edema bilaterally. Capillary refill <2 seconds.  Neurological examination: MS:  Awake, alert, interactive. Normal eye contact, answered the questions appropriately, speech was fluent, normal comprehension.  Attention and concentration were normal.  Alert to self, place, month, and situation. Cranial nerves:    CN II:  PERRLA.  Visual fields intact to confrontation.  Blinks normally to threat bilaterally. CNs III, IV, VI: Unable to perform right lateral extraocular movement with both eyes.  No nystagmus.  No  ptosis or diplopia. CN V:  Facial sensation is normal, no weakness of masticatory muscles.  CN VII:  No facial weakness or asymmetry.  CN VIII:  Auditory acuity grossly  normal.  Hearing intact to finger rub bilaterally. CNs XI/X:  Palate elevation symmetric. CN XI:  Normal sternocleidomastoid and trapezius strength. CN XII: Tongue is midline without atrophy or fasciculations. MOTOR:  Strength 5/5 RUE, 5/5 LUE, 5/5 RLE, 5/5 LLE. COORDINATION:  Intact finger-to-nose with tremor.  Some difficulty with balance. SENSATION:  Intact to light touch over all four extremities. GAIT: Antalgic gait.  ASSESSMENT/PLAN:   Assessment & Plan History of CVA (cerebrovascular accident) Patient now with 4 CVAs total.  CVA occurred shortly after discontinuing clopidogrel  for tonsillectomy.  Suspect he will need to be on clopidogrel  indefinitely.  A1c 4.6 on check in hospital, LDL 44, BP well-controlled on discharge. Remains independent in his ADLs at home, though is a high falls risk.  Has some residual coordination/balance difficulty and appears to have diminished right lateral extraocular movement.  Is ambulating with a walker at home and cane when away from home. - Continue DAPT with ASA 81 mg and clopidogrel  75 mg for 3 months until 06/03/2024  - Then continue solely clopidogrel  75 mg indefinitely - Optimize stroke risk factors as below - Continue home health SLP for dysphagia - Continue HH PT - Has neurology follow up on 06/23/2024 with [facility] Hyperlipidemia, unspecified hyperlipidemia type LDL well-controlled at 44, goal <70. - Continue atorvastatin  80 mg daily Essential hypertension BP well controlled today, goal <140/90. - Continue amlodipine  10 mg daily Tonsillar mass Benign pathology result reviewed with patient, no further follow up. Vasculogenic erectile dysfunction, unspecified vasculogenic erectile dysfunction type - Refilled Viagra  - Provided extensive counseling on caution with standing given hypotension risk in setting of falls risk and recent CVA  Follow-up scheduled for 05/27/2024.  [name], MD [facility]

## 2024-03-24 NOTE — Assessment & Plan Note (Signed)
 BP well controlled today, goal <140/90. - Continue amlodipine  10 mg daily

## 2024-03-24 NOTE — Assessment & Plan Note (Addendum)
 Patient now with 4 CVAs total.  CVA occurred shortly after discontinuing clopidogrel  for tonsillectomy.  Suspect he will need to be on clopidogrel  indefinitely.  A1c 4.6 on check in hospital, LDL 44, BP well-controlled on discharge. Remains independent in his ADLs at home, though is a high falls risk.  Has some residual coordination/balance difficulty and appears to have diminished right lateral extraocular movement.  Is ambulating with a walker at home and cane when away from home. - Continue DAPT with ASA 81 mg and clopidogrel  75 mg for 3 months until 06/03/2024  - Then continue solely clopidogrel  75 mg indefinitely - Optimize stroke risk factors as below - Continue home health SLP for dysphagia - Continue HH PT - Has neurology follow up on 06/23/2024 with [facility]

## 2024-03-29 ENCOUNTER — Telehealth: Payer: Self-pay

## 2024-03-29 NOTE — Telephone Encounter (Signed)
 Stephanie from [facility] calling for OT verbal orders as follows:  1 time(s) weekly for 4 week(s), then skip one week, then 1 time(s) weekly for 1 week(s).  Verbal orders given per [facility] protocol  Chiquita JAYSON English, RN

## 2024-04-14 ENCOUNTER — Other Ambulatory Visit: Payer: Self-pay | Admitting: Family Medicine

## 2024-04-14 DIAGNOSIS — F1010 Alcohol abuse, uncomplicated: Secondary | ICD-10-CM

## 2024-04-14 NOTE — Progress Notes (Signed)
 [facility] Worker Note Stroke Post Discharge Follow-Up  Andre [name] [id number]   Contact Type:   Telephone (unsuccessful)  Encounter Date: 04/12/2024  Outreach Project:  [place] post stroke follow up   Managed Medicaid Plan Participant:   PCP: Shitarev,Dimitry MD   Payor Status:  Medicare part A and B      Past Medical History:  Diagnosis Date   Benign essential HTN 05/09/2015   Benign prostate hyperplasia    COVID-19 virus infection 12/30/2019   Furuncle of face 08/07/2017   History of blood transfusion    qwhen I got my jaw broke (05/05/2017)   Hypertension    Localized swelling, mass or lump of neck 06/17/2017   Polysubstance abuse (HCC)    including alcohol, tobacco, and cocaine /notes 05/04/2017   Stroke (HCC) 11/12012   Stroke (HCC) 05/04/2017   Recurrent pontine stroke with resulting right hemiplegia and right cranial nerve palsies/notes 05/04/2017   Tobacco use disorder    UTI (urinary tract infection) 04/13/2021   Weakness    Social History   Substance and Sexual Activity  Alcohol Use Not Currently   Alcohol/week: 42.0 standard drinks of alcohol   Types: 42 Cans of beer per week   Comment: 05/05/2017 40oz beer/day; maybe more   Social History   Substance and Sexual Activity  Drug Use Not Currently   Types: Cocaine, Marijuana   Comment: not used since 2020   Tobacco Use History[1]  SDOH Screenings   Food Insecurity: No Food Insecurity (03/05/2024)  Housing: Low Risk (03/05/2024)  Transportation Needs: No Transportation Needs (03/05/2024)  Utilities: Not At Risk (03/01/2024)  Depression (PHQ2-9): Low Risk (06/08/2023)  Social Connections: Unknown (03/05/2024)  Tobacco Use: Medium Risk (03/03/2024)   Referrals (if applicable):   N/A Unable to contact pt      Education:    Limiting Factors:  Unable to contact pt via phone    Follow-up:  Initial follow up, future follow up to be scheduled per [facility] protocol   Follow-up  Type:   Telephone   Cherise LOISE Finder, NT         [1]  Social History Tobacco Use  Smoking Status Former   Current packs/day: 0.00   Average packs/day: 1 pack/day for 50.0 years (50.0 ttl pk-yrs)   Types: Cigarettes   Quit date: 2021   Years since quitting: 4.9   Passive exposure: Current  Smokeless Tobacco Never

## 2024-05-16 ENCOUNTER — Other Ambulatory Visit: Payer: Self-pay | Admitting: Family Medicine

## 2024-05-16 DIAGNOSIS — F1010 Alcohol abuse, uncomplicated: Secondary | ICD-10-CM

## 2024-05-27 ENCOUNTER — Ambulatory Visit: Payer: Self-pay | Admitting: Family Medicine

## 2024-05-27 NOTE — Progress Notes (Unsigned)
" ° °  SUBJECTIVE:   CHIEF COMPLAINT / HPI:  Andre [name] is a 74 y.o. male with a pertinent past medical history of HLD, HTN, CVA in 2012 and 2018 presenting to the clinic for ***.  Hx CVA Patient continues HH PT and SLP for dysphagia. *** reports that patient is having *** difficulty swallowing and currently doing *** thickened liquids. Patient is still ambulating with walker at home and cane outside the home. Progress with rehab is *** per ***.  Patient now with 4 CVAs total.  LDL and A1c at goal in November.  BP at goal today. Residual deficit in ***. - Continue DAPT with ASA 81 mg and clopidogrel  75 mg for 3 months until 06/03/2024             - Then continue solely clopidogrel  75 mg indefinitely - Continue home health SLP for dysphagia - Continue HH PT - Has neurology follow up on 06/23/2024 with [facility]   PERTINENT PMH / PSH: HTN, HLD, and BPH Cocaine use approximately 3 years ago, UDS negative in hospital CVA 2017 right pontine infarct CVA 2018 left pontine infarct CVA 2022 bilateral pontine and right PCA scattered infarct, underwent basilar artery stenting CVA 03/03/2024  *Remainder reviewed in problem list.   OBJECTIVE:   There were no vitals taken for this visit.  General: Age-appropriate, resting comfortably in chair, NAD, alert and at baseline. HEENT:  Head: Normocephalic, atraumatic. No tenderness to percussion over sinuses. Eyes: PERRLA. No conjunctival erythema or scleral injections. Ears: TMs non-bulging and non-erythematous bilaterally. No erythema of external ear canal. No cerumen impaction. Nose: Non-erythematous turbinates. No rhinorrhea. Mouth/Oral: Clear, no tonsillar exudate. MMM. Neck: Supple. No LAD. Cardiovascular: Regular rate and rhythm. Normal S1/S2. No murmurs, rubs, or gallops appreciated. 2+ radial pulses. Pulmonary: Clear bilaterally to ascultation. No wheezes, crackles, or rhonchi. Normal WOB on room air. No accessory muscle  use. Abdominal: No tenderness to deep or light palpation. No rebound or guarding. No HSM. Skin: Warm and dry. Extremities: No peripheral edema bilaterally. Capillary refill <2 seconds.  No results found for this or any previous visit (from the past 48 hours).     06/08/2023    4:21 PM  Depression screen PHQ 2/9  Decreased Interest 0  Down, Depressed, Hopeless 0  PHQ - 2 Score 0  Altered sleeping 0  Tired, decreased energy 0  Change in appetite 0  Feeling bad or failure about yourself  0  Trouble concentrating 0  Moving slowly or fidgety/restless 0  Suicidal thoughts 0  PHQ-9 Score 0      Data saved with a previous flowsheet row definition     ASSESSMENT/PLAN:   Assessment & Plan   No follow-ups on file.  Jalynn Betzold Toma, MD [facility] "

## 2024-06-03 ENCOUNTER — Ambulatory Visit: Admitting: Family Medicine

## 2024-06-03 ENCOUNTER — Encounter: Payer: Self-pay | Admitting: Family Medicine

## 2024-06-03 VITALS — BP 131/75 | HR 87 | Ht 67.0 in | Wt 202.8 lb

## 2024-06-03 DIAGNOSIS — I10 Essential (primary) hypertension: Secondary | ICD-10-CM

## 2024-06-03 DIAGNOSIS — K5904 Chronic idiopathic constipation: Secondary | ICD-10-CM

## 2024-06-03 DIAGNOSIS — N3941 Urge incontinence: Secondary | ICD-10-CM | POA: Insufficient documentation

## 2024-06-03 DIAGNOSIS — Z8673 Personal history of transient ischemic attack (TIA), and cerebral infarction without residual deficits: Secondary | ICD-10-CM | POA: Diagnosis not present

## 2024-06-03 MED ORDER — POLYETHYLENE GLYCOL 3350 17 GM/SCOOP PO POWD: 17.0000 g | Freq: Two times a day (BID) | ORAL | 1 refills | Status: AC | PRN

## 2024-06-03 MED ORDER — SOLIFENACIN SUCCINATE 10 MG PO TABS: 10.0000 mg | ORAL_TABLET | Freq: Every day | ORAL | 1 refills | Status: AC

## 2024-06-03 NOTE — Assessment & Plan Note (Addendum)
 Insurance no longer covering mirabegron .  Switching to Vesicare , which should be covered per insurance letter. - Solifenacin  10 mg daily

## 2024-06-03 NOTE — Patient Instructions (Addendum)
 It was great to see you today! Thank you for choosing [facility] for your primary care.  Today we addressed: 1. Urge incontinence The mirabegron  you were on is no longer covered by insurance.  I have switched you to Vesicare , which will arrive by mail from your mail order pharmacy.  2. Constipation I think this is why your side is uncomfortable.  I would like you to take MiraLAX  once a day for constipation.  IF this is not enough, you can take it twice per day.  Please let us  know if the left side pain is getting worse or not improving.  You should return to our clinic in 3 months.  Thank you for coming to see us  at [facility] and for the opportunity to care for you! Wendee Hata, MD 06/03/2024, 3:01 PM

## 2024-06-03 NOTE — Assessment & Plan Note (Signed)
 Well controlled today. Continue amlodipine 10mg  daily.

## 2024-06-03 NOTE — Progress Notes (Signed)
" ° °  SUBJECTIVE:   CHIEF COMPLAINT / HPI:  Andre [name] is a 74 y.o. male with a pertinent past medical history of HLD, HTN, CVA in 2012 and 2018 presenting to the clinic for stroke follow up. Patient presents to clinic alone.  Nurse aide is in the car waiting outside.  Hx CVA x4, most recent October 2025 Patient continues HH PT and SLP for dysphagia. Patient reports that he is no longer having difficulty swallowing and currently not doing thickened liquids. Patient is still ambulating with walker at home and cane outside the home. Progress with rehab is good per patient and he reports that HHPT has stopped coming now.  Urinary urge incontinence Received a message from patient's insurance that mirabegron  is not covered on formulary this year, needs change. Alternatives suggested: solifenacin , Myrbetric tablet ER, tolterodine ER 24 hr, oxybutynin ER 24 hr.  Hypertension - BP: 131/75 today. - Home medications include: Amlodipine  10 mg daily. - He endorses taking these medications as prescribed. - Does not check blood pressure at home. - Denies hypotension symptoms including dizziness and orthostatic symptoms. - Denies headache, vision changes, LUQ pain, hematuria, chest pain.  LLQ pain Patient reports a few weeks of pain in his left lower side. Mild pain, but palpable if pressing in that area. Not having issues ambulating due to his pain. No diarrhea, nausea, or vomiting. Endorses constipation, did have BM this AM and it was hard and he had to strain.  Normally has BM every 3-4 days. No BRBPR, no hematochezia. No recent weight loss. No fevers or chills.   PERTINENT PMH / PSH: HTN, HLD, and BPH Cocaine use approximately 3 years ago, UDS negative in hospital CVA 2017 right pontine infarct CVA 2018 left pontine infarct CVA 2022 bilateral pontine and right PCA scattered infarct, underwent basilar artery stenting CVA 03/03/2024  *Remainder reviewed in problem  list.   OBJECTIVE:   Vitals:   06/03/24 1446  BP: 131/75  Pulse: 87  SpO2: 96%   General: Age-appropriate, resting comfortably in chair, NAD, alert and at baseline. Cardiovascular: Regular rate and rhythm. Normal S1/S2. No murmurs, rubs, or gallops appreciated. 2+ radial pulses. Pulmonary: Clear bilaterally to ascultation. No wheezes, crackles, or rhonchi. Normal WOB on room air. No accessory muscle use. Abdominal: Mild LLQ TTP; Carnett's sign negative (pain improves when tensing).  No rebound or guarding. No HSM. Skin: Warm and dry. Extremities: No peripheral edema bilaterally. Capillary refill <2 seconds. Neuro: Mild dysarthria but speech is intelligible.  Antalgic gait ambulating with cane.  No notable unilateral deficits, strength 4/5 in bilateral upper and lower extremities.   ASSESSMENT/PLAN:   Assessment & Plan Urge incontinence Insurance no longer covering mirabegron .  Switching to Vesicare , which should be covered per insurance letter. - Solifenacin  10 mg daily Chronic idiopathic constipation Exam suggestive of constipation.  No red flag symptoms or signs. - MiraLAX  daily, up to twice daily if needed until daily soft stool - Return if worsening or not improving Essential hypertension Well controlled today. - Continue amlodipine  10 mg daily History of CVA (cerebrovascular accident) Patient now with 4 CVAs total.  LDL and A1c at goal in November.  BP at goal today. Reports no significant residual deficit at this time. - Has neurology follow up on 06/23/2024 with [facility] - Today is final day of DAPT; discontinue ASA 81 mg and continue solely clopidogrel  75 mg - Will continue clopidogrel  indefinitely  Andre Cilia Toma, MD [facility] "

## 2024-06-03 NOTE — Assessment & Plan Note (Signed)
 Patient now with 4 CVAs total.  LDL and A1c at goal in November.  BP at goal today. Reports no significant residual deficit at this time. - Has neurology follow up on 06/23/2024 with [facility] - Today is final day of DAPT; discontinue ASA 81 mg and continue solely clopidogrel  75 mg - Will continue clopidogrel  indefinitely

## 2024-06-23 ENCOUNTER — Inpatient Hospital Stay: Payer: Self-pay | Admitting: Neurology
